# Patient Record
Sex: Male | Born: 1935 | Race: White | Hispanic: No | Marital: Married | Smoking: Never smoker
Health system: Southern US, Community
[De-identification: ages and names within clinical notes are randomized; demographics above are authoritative.]

## PROBLEM LIST (undated history)

## (undated) DIAGNOSIS — N390 Urinary tract infection, site not specified: Secondary | ICD-10-CM

## (undated) DIAGNOSIS — L0390 Cellulitis, unspecified: Secondary | ICD-10-CM

## (undated) DIAGNOSIS — J189 Pneumonia, unspecified organism: Secondary | ICD-10-CM

## (undated) DIAGNOSIS — M779 Enthesopathy, unspecified: Secondary | ICD-10-CM

## (undated) DIAGNOSIS — R339 Retention of urine, unspecified: Secondary | ICD-10-CM

## (undated) DIAGNOSIS — M719 Bursopathy, unspecified: Secondary | ICD-10-CM

## (undated) DIAGNOSIS — B029 Zoster without complications: Secondary | ICD-10-CM

## (undated) HISTORY — DX: Enthesopathy, unspecified: M77.9

## (undated) HISTORY — DX: Bursopathy, unspecified: M71.9

## (undated) HISTORY — DX: Pneumonia, unspecified organism: J18.9

## (undated) HISTORY — DX: Zoster without complications: B02.9

## (undated) HISTORY — DX: Retention of urine, unspecified: R33.9

## (undated) HISTORY — DX: Cellulitis, unspecified: L03.90

## (undated) HISTORY — DX: Urinary tract infection, site not specified: N39.0

## (undated) HISTORY — PX: JOINT REPLACEMENT: SHX530

---
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
## 1957-07-28 DIAGNOSIS — B029 Zoster without complications: Secondary | ICD-10-CM

## 1957-07-28 HISTORY — DX: Zoster without complications: B02.9

## 1964-07-28 HISTORY — PX: CHOLECYSTECTOMY: SHX55

## 1984-07-28 DIAGNOSIS — L0390 Cellulitis, unspecified: Secondary | ICD-10-CM

## 1984-07-28 HISTORY — DX: Cellulitis, unspecified: L03.90

## 1985-07-28 DIAGNOSIS — M719 Bursopathy, unspecified: Secondary | ICD-10-CM

## 1985-07-28 HISTORY — DX: Bursopathy, unspecified: M71.9

## 1986-07-28 DIAGNOSIS — M779 Enthesopathy, unspecified: Secondary | ICD-10-CM

## 1986-07-28 HISTORY — DX: Enthesopathy, unspecified: M77.9

## 1997-07-28 HISTORY — PX: TOTAL HIP ARTHROPLASTY: SHX124

## 1998-07-03 ENCOUNTER — Encounter: Payer: Self-pay | Admitting: Specialist

## 1998-07-06 ENCOUNTER — Inpatient Hospital Stay (HOSPITAL_COMMUNITY): Admission: RE | Admit: 1998-07-06 | Discharge: 1998-07-11 | Payer: Self-pay | Admitting: Specialist

## 1998-07-06 ENCOUNTER — Encounter: Payer: Self-pay | Admitting: Specialist

## 1998-07-09 ENCOUNTER — Encounter: Payer: Self-pay | Admitting: Specialist

## 1998-07-11 ENCOUNTER — Inpatient Hospital Stay (HOSPITAL_COMMUNITY)
Admission: RE | Admit: 1998-07-11 | Discharge: 1998-07-19 | Payer: Self-pay | Admitting: Physical Medicine and Rehabilitation

## 1998-07-16 ENCOUNTER — Encounter: Payer: Self-pay | Admitting: Physical Medicine and Rehabilitation

## 2008-10-26 HISTORY — PX: MELANOMA EXCISION: SHX5266

## 2011-11-28 ENCOUNTER — Ambulatory Visit: Payer: Self-pay | Admitting: Internal Medicine

## 2011-12-12 ENCOUNTER — Encounter: Payer: Self-pay | Admitting: Internal Medicine

## 2011-12-12 ENCOUNTER — Ambulatory Visit (INDEPENDENT_AMBULATORY_CARE_PROVIDER_SITE_OTHER): Payer: Medicare Other | Admitting: Internal Medicine

## 2011-12-12 VITALS — BP 171/99 | HR 87 | Temp 98.3°F | Resp 18 | Ht 73.5 in | Wt 295.5 lb

## 2011-12-12 DIAGNOSIS — E669 Obesity, unspecified: Secondary | ICD-10-CM | POA: Insufficient documentation

## 2011-12-12 DIAGNOSIS — D649 Anemia, unspecified: Secondary | ICD-10-CM

## 2011-12-12 DIAGNOSIS — I10 Essential (primary) hypertension: Secondary | ICD-10-CM | POA: Insufficient documentation

## 2011-12-12 DIAGNOSIS — E785 Hyperlipidemia, unspecified: Secondary | ICD-10-CM

## 2011-12-12 DIAGNOSIS — R609 Edema, unspecified: Secondary | ICD-10-CM

## 2011-12-12 DIAGNOSIS — R230 Cyanosis: Secondary | ICD-10-CM | POA: Insufficient documentation

## 2011-12-12 LAB — LIPID PANEL
Cholesterol: 171 mg/dL (ref 0–200)
HDL: 39 mg/dL — ABNORMAL LOW (ref >39)
LDL Cholesterol: 80 mg/dL (ref 0–99)
Total CHOL/HDL Ratio: 4.4 Ratio
Triglycerides: 262 mg/dL — ABNORMAL HIGH (ref <150)
VLDL: 52 mg/dL — ABNORMAL HIGH (ref 0–40)

## 2011-12-12 LAB — COMPREHENSIVE METABOLIC PANEL
ALT: 37 U/L (ref 0–53)
AST: 40 U/L — ABNORMAL HIGH (ref 0–37)
Albumin: 4.6 g/dL (ref 3.5–5.2)
Alkaline Phosphatase: 45 U/L (ref 39–117)
BUN: 14 mg/dL (ref 6–23)
CO2: 25 mEq/L (ref 19–32)
Calcium: 9.3 mg/dL (ref 8.4–10.5)
Chloride: 104 mEq/L (ref 96–112)
Creat: 1.36 mg/dL — ABNORMAL HIGH (ref 0.50–1.35)
Glucose, Bld: 116 mg/dL — ABNORMAL HIGH (ref 70–99)
Potassium: 4.5 mEq/L (ref 3.5–5.3)
Sodium: 141 mEq/L (ref 135–145)
Total Bilirubin: 1.3 mg/dL — ABNORMAL HIGH (ref 0.3–1.2)
Total Protein: 7 g/dL (ref 6.0–8.3)

## 2011-12-12 LAB — HM COLONOSCOPY

## 2011-12-12 NOTE — Patient Instructions (Signed)
Goal of walking 15-30 minutes per day.  Check blood pressure 1-2 times per week.  Follow up in 1 month.

## 2011-12-12 NOTE — Progress Notes (Signed)
Subjective:    Patient ID: Terry Vaughn, male    DOB: 1932/11/24, 78 y.o.   MRN: [id number]  HPI 78 year old male presents to establish care. He reports that generally, he has been healthy. He has no major medical problems. He is concerned today about recent swelling in his right lower leg. This has been going on for couple of months. 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 This is improved with the use of a compression sock. He denies any pain in his calf or foot. The swelling is worse later in the day.  He is also concerned about his weight. He reports that he has not been following a healthy diet or exercising. He is ready to get back on track with healthy diet and exercise plan.  Outpatient Encounter Prescriptions as of 12/12/2011  Medication Sig Dispense Refill  . aspirin 81 MG tablet Take 81 mg by mouth daily.      . Ferrous Gluconate (IRON) 240 (27 FE) MG TABS Take 1 each by mouth every other day.      . Multiple Vitamins-Minerals (CENTRUM SILVER ULTRA MENS) TABS Take 1 each by mouth daily.      . vitamin D, CHOLECALCIFEROL, 400 UNITS tablet Take 400 Units by mouth daily.        Review of Systems  Constitutional: Negative for fever, chills, activity change, appetite change, fatigue and unexpected weight change.  Eyes: Negative for visual disturbance.  Respiratory: Negative for cough and shortness of breath.   Cardiovascular: Positive for leg swelling. Negative for chest pain and palpitations.  Gastrointestinal: Negative for abdominal pain and abdominal distention.  Genitourinary: Negative for dysuria, urgency and difficulty urinating.  Musculoskeletal: Negative for arthralgias and gait problem.  Skin: Negative for color change and rash.  Hematological: Negative for adenopathy.  Psychiatric/Behavioral: Negative for sleep disturbance and dysphoric mood. The patient is not  nervous/anxious.    BP 171/99  Pulse 87  Temp(Src) 98.3 F (36.8 C) (Oral)  Resp 18  Ht 6' 1.5" (1.867 m)  Wt 295 lb 8 oz (134.038 kg)  BMI 38.46 kg/m2  SpO2 97%     Objective:   Physical Exam  Constitutional: He is oriented to person, place, and time. He appears well-developed and well-nourished. No distress.  HENT:  Head: Normocephalic and atraumatic.  Right Ear: External ear normal.  Left Ear: External ear normal.  Nose: Nose normal.  Mouth/Throat: Oropharynx is clear and moist. No oropharyngeal exudate.  Eyes: Conjunctivae and EOM are normal. Pupils are equal, round, and reactive to light. Right eye exhibits no discharge. Left eye exhibits no discharge. No scleral icterus.  Neck: Normal range of motion. Neck supple. No tracheal deviation present. No thyromegaly present.  Cardiovascular: Normal rate, regular rhythm and normal heart sounds.  Exam reveals no gallop and no friction rub.   No murmur heard. Pulmonary/Chest: Effort normal and breath sounds normal. No respiratory distress. He has no wheezes. He has no rales. He exhibits no tenderness.  Musculoskeletal: Normal range of motion. He exhibits edema (RLE to mid calf, with palpable varicose veins). He exhibits no tenderness.  Lymphadenopathy:    He has no cervical adenopathy.  Neurological: He is alert and oriented to person, place, and time. No cranial nerve deficit. Coordination normal.  Skin: Skin is warm and dry. No rash noted. He is not diaphoretic. There is cyanosis. No erythema. No pallor.  Psychiatric: He has a normal mood and affect. His behavior is normal. Judgment and thought content normal.          Assessment & Plan:

## 2011-12-12 NOTE — Assessment & Plan Note (Signed)
Blood pressure is elevated today. Patient's wife will monitor blood pressure at home once or twice per week. She will e-mail with results. Patient will have renal function check today with labs. If blood pressure persistently elevated, will add medication. Followup one month.

## 2011-12-12 NOTE — Assessment & Plan Note (Signed)
BMI 38. Encouraged efforts at healthy diet. Set goal of walking 15-30 minutes daily. Patient will followup in one month.

## 2011-12-12 NOTE — Assessment & Plan Note (Signed)
Noted in the right foot when foot is dependent. Question if he may have arterial insufficiency. Will get vascular surgery evaluation with arterial Doppler. Followup one month.

## 2011-12-12 NOTE — Assessment & Plan Note (Signed)
Likely secondary to venous insufficiency. Will get vascular evaluation with venous Dopplers. We'll continue compression stockings. Followup one month.

## 2011-12-13 LAB — CBC WITH DIFFERENTIAL/PLATELET
Basophils Absolute: 0 10*3/uL (ref 0.0–0.1)
Basophils Relative: 0 % (ref 0–1)
Eosinophils Absolute: 0.2 10*3/uL (ref 0.0–0.7)
Eosinophils Relative: 2 % (ref 0–5)
HCT: 44.5 % (ref 39.0–52.0)
Hemoglobin: 16 g/dL (ref 13.0–17.0)
Lymphocytes Relative: 28 % (ref 12–46)
Lymphs Abs: 2.3 10*3/uL (ref 0.7–4.0)
MCH: 33.4 pg (ref 26.0–34.0)
MCHC: 36 g/dL (ref 30.0–36.0)
MCV: 92.9 fL (ref 78.0–100.0)
Monocytes Absolute: 0.5 10*3/uL (ref 0.1–1.0)
Monocytes Relative: 6 % (ref 3–12)
Neutro Abs: 5.2 10*3/uL (ref 1.7–7.7)
Neutrophils Relative %: 64 % (ref 43–77)
Platelets: 337 10*3/uL (ref 150–400)
RBC: 4.79 MIL/uL (ref 4.22–5.81)
RDW: 13.4 % (ref 11.5–15.5)
WBC: 8.2 10*3/uL (ref 4.0–10.5)

## 2011-12-17 ENCOUNTER — Other Ambulatory Visit: Payer: Medicare Other | Admitting: *Deleted

## 2011-12-17 DIAGNOSIS — N289 Disorder of kidney and ureter, unspecified: Secondary | ICD-10-CM

## 2011-12-17 LAB — MICROALBUMIN / CREATININE URINE RATIO
Creatinine,U: 54.2 mg/dL
Microalb Creat Ratio: 1.3 mg/g (ref 0.0–30.0)
Microalb, Ur: 0.7 mg/dL (ref 0.0–1.9)

## 2012-01-05 ENCOUNTER — Other Ambulatory Visit (INDEPENDENT_AMBULATORY_CARE_PROVIDER_SITE_OTHER): Payer: Medicare Other | Admitting: *Deleted

## 2012-01-05 DIAGNOSIS — Z79899 Other long term (current) drug therapy: Secondary | ICD-10-CM

## 2012-01-05 LAB — COMPREHENSIVE METABOLIC PANEL
ALT: 39 U/L (ref 0–53)
AST: 32 U/L (ref 0–37)
Albumin: 4.3 g/dL (ref 3.5–5.2)
Alkaline Phosphatase: 45 U/L (ref 39–117)
BUN: 12 mg/dL (ref 6–23)
CO2: 27 mEq/L (ref 19–32)
Calcium: 8.8 mg/dL (ref 8.4–10.5)
Chloride: 104 mEq/L (ref 96–112)
Creatinine, Ser: 1.2 mg/dL (ref 0.4–1.5)
GFR: 61.19 mL/min (ref >60.00)
Glucose, Bld: 98 mg/dL (ref 70–99)
Potassium: 4 mEq/L (ref 3.5–5.1)
Sodium: 141 mEq/L (ref 135–145)
Total Bilirubin: 1.4 mg/dL — ABNORMAL HIGH (ref 0.3–1.2)
Total Protein: 6.9 g/dL (ref 6.0–8.3)

## 2012-01-14 ENCOUNTER — Other Ambulatory Visit: Payer: Self-pay | Admitting: Internal Medicine

## 2012-01-14 DIAGNOSIS — R17 Unspecified jaundice: Secondary | ICD-10-CM

## 2012-01-15 ENCOUNTER — Ambulatory Visit: Payer: Self-pay | Admitting: Internal Medicine

## 2012-01-15 ENCOUNTER — Telehealth: Payer: Self-pay | Admitting: Internal Medicine

## 2012-01-15 NOTE — Telephone Encounter (Signed)
Patients spouse advised as instructed via telephone. 

## 2012-01-15 NOTE — Telephone Encounter (Signed)
Liver ultrasound was normal

## 2012-01-21 ENCOUNTER — Encounter: Payer: Self-pay | Admitting: Internal Medicine

## 2012-01-23 ENCOUNTER — Encounter: Payer: Medicare Other | Admitting: Internal Medicine

## 2012-01-28 ENCOUNTER — Encounter: Payer: Self-pay | Admitting: Internal Medicine

## 2012-01-28 ENCOUNTER — Ambulatory Visit (INDEPENDENT_AMBULATORY_CARE_PROVIDER_SITE_OTHER): Payer: Medicare Other | Admitting: Internal Medicine

## 2012-01-28 VITALS — BP 150/84 | HR 73 | Temp 98.3°F | Ht 73.5 in | Wt 282.0 lb

## 2012-01-28 DIAGNOSIS — R17 Unspecified jaundice: Secondary | ICD-10-CM

## 2012-01-28 DIAGNOSIS — I10 Essential (primary) hypertension: Secondary | ICD-10-CM

## 2012-01-28 DIAGNOSIS — E669 Obesity, unspecified: Secondary | ICD-10-CM

## 2012-01-28 DIAGNOSIS — Z0000 Encounter for general adult medical examination without abnormal findings: Secondary | ICD-10-CM

## 2012-01-28 DIAGNOSIS — E781 Pure hyperglyceridemia: Secondary | ICD-10-CM

## 2012-01-28 DIAGNOSIS — R609 Edema, unspecified: Secondary | ICD-10-CM

## 2012-01-28 DIAGNOSIS — Z7689 Persons encountering health services in other specified circumstances: Secondary | ICD-10-CM | POA: Insufficient documentation

## 2012-01-28 NOTE — Assessment & Plan Note (Signed)
Seen by vascular. Wearing compression stockings.  Planned follow up with vascular to assess need for further intervention in 3 months.

## 2012-01-28 NOTE — Assessment & Plan Note (Signed)
Mild elevation noted on labs. Stable. [place] of RUQ was normal. Will repeat LFTs 3 months.

## 2012-01-28 NOTE — Assessment & Plan Note (Signed)
Congratulated pt on 13lb weight loss. Encouraged him to continue with effort at healthy diet and physical activity. Encouraged him to increase walking to daily.

## 2012-01-28 NOTE — Assessment & Plan Note (Signed)
BP initially elevated, then improved today. Much better controlled at home. Suspect element of white coat hypertension. Will continue to monitor. Follow up 3 months.

## 2012-01-28 NOTE — Assessment & Plan Note (Signed)
Noted on labs. Encouraged continued efforts at healthy diet, low in saturated fat and processed carbs. Will repeat 3 months.

## 2012-01-28 NOTE — Progress Notes (Signed)
Subjective:    Patient ID: Terry Vaughn, male    DOB: [birth year], 78 y.o.   MRN: [id number]  HPI The patient is here for annual Medicare wellness examination and management of other chronic and acute problems.   The risk factors are reflected in the social history.  The roster of all physicians providing medical care to patient - is listed in the Snapshot section of the chart.  Activities of daily living:  The patient is 100% independent in all ADLs: dressing, toileting, feeding as well as independent mobility  Home safety : The patient has smoke detectors and CO detectors in the home. They wear seatbelts.  There are no firearms at home. There is no violence in the home.   There is no risks for hepatitis, STDs or HIV. There is history of blood transfusion from father. They have no travel history to infectious disease endemic areas of the world.  The patient have not seen their dentist in the last six (Dr. Betsey Holiday was previous dentist). They have seen their eye doctor in the last year (optometrist).  No hearing difficulty with regard to whispered voices and some television programs.  They have deferred audiologic testing in the last year.    They do not  have excessive sun exposure. Discussed the need for sun protection: hats, long sleeves and use of sunscreen if there is significant sun exposure. (Dr. Roseanne Kaufman is dermatologist)  Diet: the importance of a healthy diet is discussed. He does have a healthy diet. He has lost 13lbs over the last 2 months by improving his diet and increasing intake of fruits and vegetables.  The benefits of regular aerobic exercise were discussed. He walks daily .  Depression screen: there are no signs or vegative symptoms of depression- irritability, change in appetite, anhedonia, sadness/tearfullness.  Cognitive assessment: the patient manages all their financial and personal affairs and is actively engaged. They could relate day,date,year and  events.  The following portions of the patient's history were reviewed and updated as appropriate: allergies, current medications, past family history, past medical history,  past surgical history, past social history  and problem list.  Visual acuity was not assessed per patient preference since he has regular follow up with ophthalmologist. Hearing and body mass index were assessed and reviewed.   During the course of the visit the patient was educated and counseled about appropriate screening and preventive services including : fall prevention , diabetes screening, nutrition counseling, colorectal cancer screening, and recommended immunizations.    In regards to HTN, pt has been monitoring BP daily at home. He brings record of BP, which has mostly been 130-140/60-80s.  HR in 50-70s.  Today, he brings copy of Living Will, which is notarized. He prefers not to have life-prolonging interventions if chance of cure minimal.   Outpatient Encounter Prescriptions as of 01/28/2012  Medication Sig Dispense Refill  . aspirin 81 MG tablet Take 81 mg by mouth daily.      . Ferrous Gluconate (IRON) 240 (27 FE) MG TABS Take 1 each by mouth every other day.      . Multiple Vitamins-Minerals (CENTRUM SILVER ULTRA MENS) TABS Take 1 each by mouth daily.      . vitamin D, CHOLECALCIFEROL, 400 UNITS tablet Take 400 Units by mouth daily.       BP 150/84  Pulse 73  Temp 98.3 F (36.8 C) (Oral)  Ht 6' 1.5" (1.867 m)  Wt 282 lb (127.914 kg)  BMI 36.70 kg/m2  SpO2 97%  Review of Systems  Constitutional: Negative for fever, chills, activity change, appetite change, fatigue and unexpected weight change.  Eyes: Negative for visual disturbance.  Respiratory: Negative for cough and shortness of breath.   Cardiovascular: Negative for chest pain, palpitations and leg swelling.  Gastrointestinal: Negative for abdominal pain and abdominal distention.  Genitourinary: Negative for dysuria, urgency and difficulty  urinating.  Musculoskeletal: Negative for arthralgias and gait problem.  Skin: Negative for color change and rash.  Hematological: Negative for adenopathy.  Psychiatric/Behavioral: Negative for disturbed wake/sleep cycle and dysphoric mood. The patient is not nervous/anxious.        Objective:   Physical Exam  Constitutional: He is oriented to person, place, and time. He appears well-developed and well-nourished. No distress.  HENT:  Head: Normocephalic and atraumatic.  Right Ear: External ear normal.  Left Ear: External ear normal.  Nose: Nose normal.  Mouth/Throat: Oropharynx is clear and moist. No oropharyngeal exudate.  Eyes: Conjunctivae and EOM are normal. Pupils are equal, round, and reactive to light. Right eye exhibits no discharge. Left eye exhibits no discharge. No scleral icterus.  Neck: Normal range of motion. Neck supple. No tracheal deviation present. No thyromegaly present.  Cardiovascular: Normal rate, regular rhythm and normal heart sounds.  Exam reveals no gallop and no friction rub.   No murmur heard. Pulmonary/Chest: Effort normal and breath sounds normal. No respiratory distress. He has no wheezes. He has no rales. He exhibits no tenderness.  Abdominal: Soft. Bowel sounds are normal. He exhibits no distension and no mass. There is no tenderness. There is no guarding.  Musculoskeletal: Normal range of motion. He exhibits no edema.  Lymphadenopathy:    He has no cervical adenopathy.  Neurological: He is alert and oriented to person, place, and time. No cranial nerve deficit. Coordination normal.  Skin: Skin is warm and dry. No rash noted. He is not diaphoretic. No erythema. No pallor.  Psychiatric: He has a normal mood and affect. His behavior is normal. Judgment and thought content normal.          Assessment & Plan:

## 2012-01-28 NOTE — Assessment & Plan Note (Signed)
General exam normal today. Chronic health issues addressed as described. Appropriate screening performed. Health maintenance UTD. Follow up 3 months.

## 2012-03-18 LAB — HM DIABETES EYE EXAM

## 2012-05-28 ENCOUNTER — Other Ambulatory Visit (INDEPENDENT_AMBULATORY_CARE_PROVIDER_SITE_OTHER): Payer: 59

## 2012-05-28 DIAGNOSIS — R17 Unspecified jaundice: Secondary | ICD-10-CM

## 2012-05-28 DIAGNOSIS — E781 Pure hyperglyceridemia: Secondary | ICD-10-CM

## 2012-05-28 DIAGNOSIS — E669 Obesity, unspecified: Secondary | ICD-10-CM

## 2012-05-28 LAB — COMPREHENSIVE METABOLIC PANEL
ALT: 49 U/L (ref 0–53)
AST: 44 U/L — ABNORMAL HIGH (ref 0–37)
Albumin: 4.2 g/dL (ref 3.5–5.2)
Alkaline Phosphatase: 42 U/L (ref 39–117)
BUN: 14 mg/dL (ref 6–23)
CO2: 32 mEq/L (ref 19–32)
Calcium: 9 mg/dL (ref 8.4–10.5)
Chloride: 102 mEq/L (ref 96–112)
Creatinine, Ser: 1.2 mg/dL (ref 0.4–1.5)
GFR: 65.44 mL/min (ref >60.00)
Glucose, Bld: 91 mg/dL (ref 70–99)
Potassium: 4.2 mEq/L (ref 3.5–5.1)
Sodium: 140 mEq/L (ref 135–145)
Total Bilirubin: 2 mg/dL — ABNORMAL HIGH (ref 0.3–1.2)
Total Protein: 6.8 g/dL (ref 6.0–8.3)

## 2012-05-28 LAB — LIPID PANEL
Cholesterol: 139 mg/dL (ref 0–200)
HDL: 41 mg/dL (ref >39.00)
LDL Cholesterol: 78 mg/dL (ref 0–99)
Total CHOL/HDL Ratio: 3
Triglycerides: 99 mg/dL (ref 0.0–149.0)
VLDL: 19.8 mg/dL (ref 0.0–40.0)

## 2012-06-02 ENCOUNTER — Ambulatory Visit (INDEPENDENT_AMBULATORY_CARE_PROVIDER_SITE_OTHER): Payer: 59 | Admitting: Internal Medicine

## 2012-06-02 ENCOUNTER — Encounter: Payer: Self-pay | Admitting: Internal Medicine

## 2012-06-02 VITALS — BP 140/90 | HR 61 | Temp 98.1°F | Ht 73.5 in | Wt 262.8 lb

## 2012-06-02 DIAGNOSIS — R17 Unspecified jaundice: Secondary | ICD-10-CM

## 2012-06-02 DIAGNOSIS — I10 Essential (primary) hypertension: Secondary | ICD-10-CM

## 2012-06-02 DIAGNOSIS — E669 Obesity, unspecified: Secondary | ICD-10-CM

## 2012-06-02 NOTE — Assessment & Plan Note (Signed)
Recent BP well controlled. Will continue to monitor.

## 2012-06-02 NOTE — Assessment & Plan Note (Signed)
Bilirubin increased on recent labs. Recommended referral to GI for upper endoscopy with EUS for further evaluation, however pt declined. Will instead repeat abdominal US to see if any change in CBD, which was at upper limits of normal size on exam in 12/2011.  Repeat CMP in 3 months.

## 2012-06-02 NOTE — Progress Notes (Signed)
  Subjective:    Patient ID: Terry Vaughn, male    DOB: [birth year], 78 y.o.   MRN: [id number]  HPI 78 year old male with history of hypertension, obesity presents for followup. In regards to hypertension, he brings record of blood pressures which has been typically 1:30 to 140 over 80s. He denies any chest pain, headache, palpitations. He has been following a healthy diet and has been exercising daily by walking at least 30 minutes. He reports that he feels great and energy level is much improved compared to previous. He has lost over 30 pounds since starting healthy diet and exercise program. He denies any nausea, vomiting, abdominal pain, change in bowel habits.  Outpatient Encounter Prescriptions as of 06/02/2012  Medication Sig Dispense Refill  . aspirin 81 MG tablet Take 81 mg by mouth daily.      . Ferrous Gluconate (IRON) 240 (27 FE) MG TABS Take 1 each by mouth every other day.      . Multiple Vitamins-Minerals (CENTRUM SILVER ULTRA MENS) TABS Take 1 each by mouth daily.      . vitamin D, CHOLECALCIFEROL, 400 UNITS tablet Take 400 Units by mouth daily.        Review of Systems  Constitutional: Negative for fever, chills, activity change, appetite change, fatigue and unexpected weight change.  Eyes: Negative for visual disturbance.  Respiratory: Negative for cough and shortness of breath.   Cardiovascular: Negative for chest pain, palpitations and leg swelling.  Gastrointestinal: Negative for abdominal pain and abdominal distention.  Genitourinary: Negative for dysuria, urgency and difficulty urinating.  Musculoskeletal: Negative for arthralgias and gait problem.  Skin: Negative for color change and rash.  Hematological: Negative for adenopathy.  Psychiatric/Behavioral: Negative for sleep disturbance and dysphoric mood. The patient is not nervous/anxious.        Objective:   Physical Exam  Constitutional: He is oriented to person, place, and time. He appears well-developed and  well-nourished. No distress.  HENT:  Head: Normocephalic and atraumatic.  Right Ear: External ear normal.  Left Ear: External ear normal.  Nose: Nose normal.  Mouth/Throat: Oropharynx is clear and moist. No oropharyngeal exudate.  Eyes: Conjunctivae normal and EOM are normal. Pupils are equal, round, and reactive to light. Right eye exhibits no discharge. Left eye exhibits no discharge. No scleral icterus.  Neck: Normal range of motion. Neck supple. No tracheal deviation present. No thyromegaly present.  Cardiovascular: Normal rate, regular rhythm and normal heart sounds.  Exam reveals no gallop and no friction rub.   No murmur heard. Pulmonary/Chest: Effort normal and breath sounds normal. No respiratory distress. He has no wheezes. He has no rales. He exhibits no tenderness.  Abdominal: Soft. Bowel sounds are normal. He exhibits no distension. There is no tenderness.  Musculoskeletal: Normal range of motion. He exhibits no edema.  Lymphadenopathy:    He has no cervical adenopathy.  Neurological: He is alert and oriented to person, place, and time. No cranial nerve deficit. Coordination normal.  Skin: Skin is warm and dry. No rash noted. He is not diaphoretic. No erythema. No pallor.  Psychiatric: He has a normal mood and affect. His behavior is normal. Judgment and thought content normal.          Assessment & Plan:

## 2012-06-02 NOTE — Assessment & Plan Note (Signed)
Congratulated pt on 30lb weight loss. Encouraged him to continue efforts at healthy diet and regular physical activity.

## 2012-06-08 ENCOUNTER — Ambulatory Visit: Payer: Self-pay | Admitting: Internal Medicine

## 2012-06-10 ENCOUNTER — Telehealth: Payer: Self-pay | Admitting: Internal Medicine

## 2012-06-10 NOTE — Telephone Encounter (Signed)
Patient spouse advised as instructed, she will talk with Homero Fellers and call us back tomorrow.

## 2012-06-10 NOTE — Telephone Encounter (Signed)
Results given to Dr. Dan Humphreys.

## 2012-06-10 NOTE — Telephone Encounter (Signed)
Pt's wife is calling concerning the results on the abdominal ultrasound. They were wanting to know if the results have come in and if everything was ok.

## 2012-06-10 NOTE — Telephone Encounter (Signed)
Requested [place] results from [facility].

## 2012-06-10 NOTE — Telephone Encounter (Signed)
Abdominal US was normal, however common bile duct still enlarged. I would like to set pt up with GI physician given persistent elevation of LFTs.  They may want to do a special form of upper endoscopy to look at the bile duct.

## 2012-06-10 NOTE — Telephone Encounter (Signed)
I have not yet received results on abdominal US. Can you please request this and let pt know?

## 2012-06-14 ENCOUNTER — Telehealth: Payer: Self-pay | Admitting: Internal Medicine

## 2012-06-14 NOTE — Telephone Encounter (Signed)
Pt called would like to go to dr Leander Rams [facility]  Cannot go 11/20  And 11/21

## 2012-06-15 NOTE — Telephone Encounter (Signed)
Pt calling about his endoscopy appointment

## 2012-06-17 NOTE — Telephone Encounter (Signed)
Terry Vaughn, Can you look into this? He was to be scheduled with GI physician.

## 2012-06-21 NOTE — Telephone Encounter (Signed)
Patient scheduled with Dr. Skeet Simmer on 12.2.13 @ 2:15 at [facility] . His wife is aware of appointment.

## 2012-06-22 ENCOUNTER — Encounter: Payer: Self-pay | Admitting: Internal Medicine

## 2012-07-02 ENCOUNTER — Ambulatory Visit: Payer: Self-pay | Admitting: Gastroenterology

## 2012-12-01 ENCOUNTER — Ambulatory Visit (INDEPENDENT_AMBULATORY_CARE_PROVIDER_SITE_OTHER): Payer: Medicare Other | Admitting: Internal Medicine

## 2012-12-01 ENCOUNTER — Encounter: Payer: Self-pay | Admitting: Internal Medicine

## 2012-12-01 VITALS — BP 160/98 | HR 72 | Temp 98.1°F | Wt 259.0 lb

## 2012-12-01 DIAGNOSIS — R17 Unspecified jaundice: Secondary | ICD-10-CM

## 2012-12-01 DIAGNOSIS — I10 Essential (primary) hypertension: Secondary | ICD-10-CM

## 2012-12-01 DIAGNOSIS — E669 Obesity, unspecified: Secondary | ICD-10-CM

## 2012-12-01 LAB — COMPREHENSIVE METABOLIC PANEL
ALT: 40 U/L (ref 0–53)
AST: 31 U/L (ref 0–37)
Albumin: 4.6 g/dL (ref 3.5–5.2)
Alkaline Phosphatase: 49 U/L (ref 39–117)
BUN: 13 mg/dL (ref 6–23)
CO2: 32 mEq/L (ref 19–32)
Calcium: 9.3 mg/dL (ref 8.4–10.5)
Chloride: 100 mEq/L (ref 96–112)
Creatinine, Ser: 1.2 mg/dL (ref 0.4–1.5)
GFR: 61.63 mL/min (ref >60.00)
Glucose, Bld: 98 mg/dL (ref 70–99)
Potassium: 4.4 mEq/L (ref 3.5–5.1)
Sodium: 139 mEq/L (ref 135–145)
Total Bilirubin: 2.1 mg/dL — ABNORMAL HIGH (ref 0.3–1.2)
Total Protein: 7.3 g/dL (ref 6.0–8.3)

## 2012-12-01 LAB — MICROALBUMIN / CREATININE URINE RATIO
Creatinine,U: 89.6 mg/dL
Microalb Creat Ratio: 0.6 mg/g (ref 0.0–30.0)
Microalb, Ur: 0.5 mg/dL (ref 0.0–1.9)

## 2012-12-01 NOTE — Progress Notes (Signed)
Subjective:    Patient ID: Terry Vaughn, male    DOB: [birth year], 79 y.o.   MRN: [id number]  HPI 79 year old male presents for followup. He is very upset today reporting that this visit should have been scheduled at the physical exam, however we had a long discussion about how his last physical exam was 01/28/2012 in he is not yet due for repeat physical. He is also extremely frustrated and upset about ongoing billing issues. He reports that being anxious and upset about this has increased his blood pressure. At home, his blood pressure has generally been well-controlled, he reports typically 130-140/80-90. He denies any chest pain, palpitations, headache.  At his last visit, he was noted to have persistent elevation of bilirubin level and slightly enlarged common bile duct on ultrasound. He was referred to GI physician and underwent CT scan of the abdomen which she reports was normal. He was told that elevated bilirubin was "normal "for him. He denies any symptoms such as abdominal pain, nausea, vomiting.  In regards to obesity, he reports some dietary indiscretion and reduced frequency of exercise over the winter months.  Outpatient Encounter Prescriptions as of 12/01/2012  Medication Sig Dispense Refill  . aspirin 81 MG tablet Take 81 mg by mouth daily.      . Ferrous Gluconate (IRON) 240 (27 FE) MG TABS Take 1 each by mouth every other day.      . Multiple Vitamins-Minerals (CENTRUM SILVER ULTRA MENS) TABS Take 1 each by mouth daily.      . vitamin D, CHOLECALCIFEROL, 400 UNITS tablet Take 400 Units by mouth daily.       No facility-administered encounter medications on file as of 12/01/2012.   BP 160/98  Pulse 72  Temp(Src) 98.1 F (36.7 C) (Oral)  Wt 259 lb (117.482 kg)  BMI 33.7 kg/m2  SpO2 97%  Review of Systems  Constitutional: Negative for fever, chills, activity change, appetite change, fatigue and unexpected weight change.  Eyes: Negative for visual disturbance.  Respiratory:  Negative for cough and shortness of breath.   Cardiovascular: Negative for chest pain, palpitations and leg swelling.  Gastrointestinal: Negative for abdominal pain and abdominal distention.  Genitourinary: Negative for dysuria, urgency and difficulty urinating.  Musculoskeletal: Negative for arthralgias and gait problem.  Skin: Negative for color change and rash.  Hematological: Negative for adenopathy.  Psychiatric/Behavioral: Positive for agitation. Negative for sleep disturbance and dysphoric mood. The patient is nervous/anxious.        Objective:   Physical Exam  Constitutional: He is oriented to person, place, and time. He appears well-developed and well-nourished. No distress.  HENT:  Head: Normocephalic and atraumatic.  Right Ear: External ear normal.  Left Ear: External ear normal.  Nose: Nose normal.  Mouth/Throat: Oropharynx is clear and moist. No oropharyngeal exudate.  Eyes: Conjunctivae and EOM are normal. Pupils are equal, round, and reactive to light. Right eye exhibits no discharge. Left eye exhibits no discharge. No scleral icterus.  Neck: Normal range of motion. Neck supple. No tracheal deviation present. No thyromegaly present.  Cardiovascular: Normal rate, regular rhythm and normal heart sounds.  Exam reveals no gallop and no friction rub.   No murmur heard. Pulmonary/Chest: Effort normal and breath sounds normal. No accessory muscle usage. Not tachypneic. No respiratory distress. He has no decreased breath sounds. He has no wheezes. He has no rhonchi. He has no rales. He exhibits no tenderness.  Musculoskeletal: Normal range of motion. He exhibits no edema.  Lymphadenopathy:  He has no cervical adenopathy.  Neurological: He is alert and oriented to person, place, and time. No cranial nerve deficit. Coordination normal.  Skin: Skin is warm and dry. No rash noted. He is not diaphoretic. No erythema. No pallor.  Psychiatric: His speech is normal. Judgment and  thought content normal. His mood appears anxious. His affect is angry. He is agitated.          Assessment & Plan:

## 2012-12-01 NOTE — Assessment & Plan Note (Signed)
BP Readings from Last 3 Encounters:  12/01/12 160/98  06/02/12 140/90  01/28/12 150/84   BP elevated today, however pt very upset during his visit about ongoing care and billing issues. Reports BP has been wellcontrolled at home. Will continue to monitor. Renal function normal today. Followup 3 months and prn.

## 2012-12-01 NOTE — Assessment & Plan Note (Signed)
Bilirubin continues to be elevated. Pt was evaluated by Dr. Skeet Simmer with GI and reports he was told this is "normal" for him. Had CT scan which was normal. Will request records on this evaluation. Followup 3-6 months and prn.

## 2012-12-01 NOTE — Assessment & Plan Note (Signed)
Wt Readings from Last 3 Encounters:  12/01/12 259 lb (117.482 kg)  06/02/12 262 lb 12 oz (119.183 kg)  01/28/12 282 lb (127.914 kg)   Weight down another 3 lbs. Encouraged compliance with diet and exercise.

## 2012-12-24 ENCOUNTER — Encounter: Payer: Self-pay | Admitting: Gastroenterology

## 2013-02-14 ENCOUNTER — Ambulatory Visit (INDEPENDENT_AMBULATORY_CARE_PROVIDER_SITE_OTHER): Payer: Medicare Other | Admitting: Adult Health

## 2013-02-14 ENCOUNTER — Encounter: Payer: Self-pay | Admitting: Adult Health

## 2013-02-14 VITALS — BP 110/66 | HR 66 | Temp 97.5°F | Resp 12 | Wt 262.0 lb

## 2013-02-14 DIAGNOSIS — B029 Zoster without complications: Secondary | ICD-10-CM | POA: Insufficient documentation

## 2013-02-14 MED ORDER — VALACYCLOVIR HCL 1 G PO TABS: 1000.0000 mg | ORAL_TABLET | Freq: Three times a day (TID) | ORAL | Status: DC

## 2013-02-14 NOTE — Patient Instructions (Addendum)
  You do have shingles.  Start Valacyclovir 1000 mg every 8 hours for 7 days.  Apply calamine lotion to the area.  Please call if your symptoms worsen or if you begin to experience pain not relieved by the Aleve.

## 2013-02-14 NOTE — Assessment & Plan Note (Signed)
Start valacyclovir. Apply calamine lotion to affected area. Pt has been getting good relief with Aleve. RTC if symptoms worsen or if pain not relieved by Aleve.

## 2013-02-14 NOTE — Progress Notes (Signed)
  Subjective:    Patient ID: Terry Vaughn, male    DOB: 1933-05-08, 79 y.o.   MRN: [id number]  HPI  Patient presents to clinic with development of erythema and a maculopapular rash on the left side of abdomen and around towards the back. First noticed the rash on Friday. It is painful without any c/o headache or malaise.   Current Outpatient Prescriptions on File Prior to Visit  Medication Sig Dispense Refill  . aspirin 81 MG tablet Take 81 mg by mouth daily.      . Ferrous Gluconate (IRON) 240 (27 FE) MG TABS Take 1 each by mouth every other day.      . Multiple Vitamins-Minerals (CENTRUM SILVER ULTRA MENS) TABS Take 1 each by mouth daily.      . vitamin D, CHOLECALCIFEROL, 400 UNITS tablet Take 400 Units by mouth daily.       No current facility-administered medications on file prior to visit.    Review of Systems  Constitutional: Negative.   Respiratory: Negative.   Cardiovascular: Negative.   Skin: Positive for rash.       Maculopapular rash on left abdomen around towards the back. Hx of shingles at age 28.     BP 110/66  Pulse 66  Temp(Src) 97.5 F (36.4 C) (Oral)  Resp 12  Wt 262 lb (118.842 kg)  BMI 34.09 kg/m2  SpO2 98%     Objective:   Physical Exam  Constitutional: He is oriented to person, place, and time. He appears well-developed and well-nourished. No distress.  Cardiovascular: Normal rate and regular rhythm.   Pulmonary/Chest: Effort normal. No respiratory distress.  Neurological: He is alert and oriented to person, place, and time.  Skin:  Maculopapular rash on left side of abdomen. There are several clusters of clear vesicles noted consistent with shingles.   Psychiatric: He has a normal mood and affect. His behavior is normal. Judgment and thought content normal.          Assessment & Plan:

## 2013-03-17 ENCOUNTER — Encounter: Payer: Medicare Other | Admitting: Internal Medicine

## 2013-03-22 ENCOUNTER — Encounter: Payer: Self-pay | Admitting: Internal Medicine

## 2013-03-22 ENCOUNTER — Ambulatory Visit (INDEPENDENT_AMBULATORY_CARE_PROVIDER_SITE_OTHER): Payer: Medicare Other | Admitting: Internal Medicine

## 2013-03-22 VITALS — BP 144/90 | HR 70 | Temp 97.9°F | Ht 73.25 in | Wt 261.0 lb

## 2013-03-22 DIAGNOSIS — Z0000 Encounter for general adult medical examination without abnormal findings: Secondary | ICD-10-CM

## 2013-03-22 DIAGNOSIS — D509 Iron deficiency anemia, unspecified: Secondary | ICD-10-CM | POA: Insufficient documentation

## 2013-03-22 DIAGNOSIS — I10 Essential (primary) hypertension: Secondary | ICD-10-CM

## 2013-03-22 DIAGNOSIS — E669 Obesity, unspecified: Secondary | ICD-10-CM

## 2013-03-22 LAB — CBC WITH DIFFERENTIAL/PLATELET
Basophils Absolute: 0.1 10*3/uL (ref 0.0–0.1)
Basophils Relative: 0.7 % (ref 0.0–3.0)
Eosinophils Absolute: 0.1 10*3/uL (ref 0.0–0.7)
Eosinophils Relative: 1.6 % (ref 0.0–5.0)
HCT: 43.6 % (ref 39.0–52.0)
Hemoglobin: 15 g/dL (ref 13.0–17.0)
Lymphocytes Relative: 29.1 % (ref 12.0–46.0)
Lymphs Abs: 2.4 10*3/uL (ref 0.7–4.0)
MCHC: 34.4 g/dL (ref 30.0–36.0)
MCV: 97.1 fl (ref 78.0–100.0)
Monocytes Absolute: 0.6 10*3/uL (ref 0.1–1.0)
Monocytes Relative: 7.7 % (ref 3.0–12.0)
Neutro Abs: 5.1 10*3/uL (ref 1.4–7.7)
Neutrophils Relative %: 60.9 % (ref 43.0–77.0)
Platelets: 331 10*3/uL (ref 150.0–400.0)
RBC: 4.49 Mil/uL (ref 4.22–5.81)
RDW: 14.6 % (ref 11.5–14.6)
WBC: 8.4 10*3/uL (ref 4.5–10.5)

## 2013-03-22 LAB — COMPREHENSIVE METABOLIC PANEL
ALT: 40 U/L (ref 0–53)
AST: 39 U/L — ABNORMAL HIGH (ref 0–37)
Albumin: 4.4 g/dL (ref 3.5–5.2)
Alkaline Phosphatase: 46 U/L (ref 39–117)
BUN: 17 mg/dL (ref 6–23)
CO2: 31 mEq/L (ref 19–32)
Calcium: 9.2 mg/dL (ref 8.4–10.5)
Chloride: 101 mEq/L (ref 96–112)
Creatinine, Ser: 1.3 mg/dL (ref 0.4–1.5)
GFR: 58.23 mL/min — ABNORMAL LOW (ref >60.00)
Glucose, Bld: 94 mg/dL (ref 70–99)
Potassium: 4.6 mEq/L (ref 3.5–5.1)
Sodium: 138 mEq/L (ref 135–145)
Total Bilirubin: 1.5 mg/dL — ABNORMAL HIGH (ref 0.3–1.2)
Total Protein: 7.3 g/dL (ref 6.0–8.3)

## 2013-03-22 LAB — FERRITIN: Ferritin: 386.9 ng/mL — ABNORMAL HIGH (ref 22.0–322.0)

## 2013-03-22 LAB — LIPID PANEL
Cholesterol: 147 mg/dL (ref 0–200)
HDL: 44.4 mg/dL (ref >39.00)
LDL Cholesterol: 85 mg/dL (ref 0–99)
Total CHOL/HDL Ratio: 3
Triglycerides: 87 mg/dL (ref 0.0–149.0)
VLDL: 17.4 mg/dL (ref 0.0–40.0)

## 2013-03-22 NOTE — Progress Notes (Signed)
Subjective:    Patient ID: Terry Vaughn, male    DOB: 09/13/1932, 79 y.o.   MRN: [id number]  HPI The patient is here for annual Medicare wellness examination and management of other chronic and acute problems.   The risk factors are reflected in the social history.  The roster of all physicians providing medical care to patient - is listed in the Snapshot section of the chart.  Activities of daily living:  The patient is 100% independent in all ADLs: dressing, toileting, feeding as well as independent mobility  Home safety : The patient has smoke detectors and CO detectors in the home. They wear seatbelts.  There are no firearms at home. There is no violence in the home.   There is no risks for hepatitis, STDs or HIV. There is history of blood transfusion from father, 59. They have no travel history to infectious disease endemic areas of the world.  The patient have scheduled to see dentist, Dr. Shirley Muscat, this year. They have seen their eye doctor in the last year (optometrist).  Scheduled for cataract removal bilaterally with Dr. Haskel Khan, [facility]. No hearing difficulty with regard to whispered voices and some television programs.  They have deferred audiologic testing in the last year.    They do not  have excessive sun exposure. Discussed the need for sun protection: hats, long sleeves and use of sunscreen if there is significant sun exposure. (Dr. Roseanne Kaufman is dermatologist)  Diet: the importance of a healthy diet is discussed. He does have a healthy diet.   The benefits of regular aerobic exercise were discussed. He walks daily if possible or does yard work.  Depression screen: there are no signs or vegative symptoms of depression- irritability, change in appetite, anhedonia, sadness/tearfullness.  Cognitive assessment: the patient manages all their financial and personal affairs and is actively engaged. They could relate day,date,year and events.  The following  portions of the patient's history were reviewed and updated as appropriate: allergies, current medications, past family history, past medical history,  past surgical history, past social history  and problem list.  Visual acuity was not assessed per patient preference since he has regular follow up with ophthalmologist. Hearing and body mass index were assessed and reviewed.   During the course of the visit the patient was educated and counseled about appropriate screening and preventive services including : fall prevention , diabetes screening, nutrition counseling, colorectal cancer screening, and recommended immunizations.    In regards to HTN, pt has been monitoring BP at home. He did not bring records today. Reports BP well controlled, <140/90.  Outpatient Encounter Prescriptions as of 03/22/2013  Medication Sig Dispense Refill  . aspirin 81 MG tablet Take 81 mg by mouth daily.      . Ferrous Gluconate (IRON) 240 (27 FE) MG TABS Take 1 each by mouth every other day.      . Multiple Vitamins-Minerals (CENTRUM SILVER ULTRA MENS) TABS Take 1 each by mouth daily.      . vitamin D, CHOLECALCIFEROL, 400 UNITS tablet Take 400 Units by mouth daily.      . [DISCONTINUED] valACYclovir (VALTREX) 1000 MG tablet Take 1 tablet (1,000 mg total) by mouth 3 (three) times daily.  21 tablet  0   No facility-administered encounter medications on file as of 03/22/2013.   BP 144/90  Pulse 70  Temp(Src) 97.9 F (36.6 C) (Oral)  Ht 6' 1.25" (1.861 m)  Wt 261 lb (118.389 kg)  BMI 34.18 kg/m2  SpO2  95%    Review of Systems  Constitutional: Negative for fever, chills, activity change, appetite change, fatigue and unexpected weight change.  Eyes: Negative for visual disturbance.  Respiratory: Negative for cough and shortness of breath.   Cardiovascular: Negative for chest pain, palpitations and leg swelling.  Gastrointestinal: Negative for abdominal pain and abdominal distention.  Genitourinary: Negative  for dysuria, urgency and difficulty urinating.  Musculoskeletal: Negative for arthralgias and gait problem.  Skin: Negative for color change and rash.  Hematological: Negative for adenopathy.  Psychiatric/Behavioral: Negative for sleep disturbance and dysphoric mood. The patient is not nervous/anxious.        Objective:   Physical Exam  Constitutional: He is oriented to person, place, and time. He appears well-developed and well-nourished. No distress.  HENT:  Head: Normocephalic and atraumatic.  Right Ear: External ear normal.  Left Ear: External ear normal.  Nose: Nose normal.  Mouth/Throat: Oropharynx is clear and moist. No oropharyngeal exudate.  Eyes: Conjunctivae and EOM are normal. Pupils are equal, round, and reactive to light. Right eye exhibits no discharge. Left eye exhibits no discharge. No scleral icterus.  Neck: Normal range of motion. Neck supple. No tracheal deviation present. No thyromegaly present.  Cardiovascular: Normal rate, regular rhythm and normal heart sounds.  Exam reveals no gallop and no friction rub.   No murmur heard. Pulmonary/Chest: Effort normal and breath sounds normal. No respiratory distress. He has no wheezes. He has no rales. He exhibits no tenderness.  Musculoskeletal: Normal range of motion. He exhibits no edema.  Lymphadenopathy:    He has no cervical adenopathy.  Neurological: He is alert and oriented to person, place, and time. No cranial nerve deficit. Coordination normal.  Skin: Skin is warm and dry. No rash noted. He is not diaphoretic. No erythema. No pallor.  Psychiatric: He has a normal mood and affect. His behavior is normal. Judgment and thought content normal.          Assessment & Plan:

## 2013-03-22 NOTE — Assessment & Plan Note (Addendum)
General exam normal today. Chronic health issues addressed as described. Appropriate screening performed. We discussed screening for prostate cancer with PSA, however he declines. Health maintenance UTD. Follow up 6-12 months and prn.

## 2013-03-22 NOTE — Assessment & Plan Note (Signed)
Previous h/o iron deficiency on Ferrous gluconate. Will check CBC and ferritin with labs today.

## 2013-03-22 NOTE — Assessment & Plan Note (Signed)
Wt Readings from Last 3 Encounters:  03/22/13 261 lb (118.389 kg)  02/14/13 262 lb (118.842 kg)  12/01/12 259 lb (117.482 kg)   Encouraged continued effort at healthy diet and regular physical activity with goal of weight loss.

## 2013-03-22 NOTE — Assessment & Plan Note (Signed)
BP Readings from Last 3 Encounters:  03/22/13 144/90  02/14/13 110/66  12/01/12 160/98   BP elevated today, however generally has been well controlled. Encouraged continued monitoring of BP. Pt will call if consistently >140/90 at home.

## 2013-03-30 ENCOUNTER — Ambulatory Visit: Payer: Self-pay | Admitting: Ophthalmology

## 2013-04-20 ENCOUNTER — Ambulatory Visit: Payer: Self-pay | Admitting: Ophthalmology

## 2013-07-28 HISTORY — PX: EYE SURGERY: SHX253

## 2013-11-18 ENCOUNTER — Encounter: Payer: Self-pay | Admitting: Internal Medicine

## 2014-03-24 ENCOUNTER — Ambulatory Visit (INDEPENDENT_AMBULATORY_CARE_PROVIDER_SITE_OTHER): Payer: Medicare Other | Admitting: Internal Medicine

## 2014-03-24 ENCOUNTER — Encounter: Payer: Self-pay | Admitting: Internal Medicine

## 2014-03-24 VITALS — BP 148/70 | HR 62 | Temp 98.0°F | Ht 73.0 in | Wt 278.8 lb

## 2014-03-24 DIAGNOSIS — Z0000 Encounter for general adult medical examination without abnormal findings: Secondary | ICD-10-CM

## 2014-03-24 DIAGNOSIS — Z23 Encounter for immunization: Secondary | ICD-10-CM

## 2014-03-24 NOTE — Addendum Note (Signed)
Addended by: Marchia Meiers on: 03/24/2014 04:20 PM   Modules accepted: Orders

## 2014-03-24 NOTE — Assessment & Plan Note (Addendum)
General exam normal today. Pt declines any labwork. Pt declines flu vaccine, as he prefers to have later this year. Prevnar given today.Health maintenance UTD. Encouraged healthy diet and exercise. Follow up 12 months and prn.

## 2014-03-24 NOTE — Progress Notes (Signed)
Pre visit review using our clinic review tool, if applicable. No additional management support is needed unless otherwise documented below in the visit note. 

## 2014-03-24 NOTE — Patient Instructions (Signed)

## 2014-03-24 NOTE — Progress Notes (Signed)
Subjective:    Patient ID: Terry Vaughn, male    DOB: April 13, 1933, 80 y.o.   MRN: [id number]  HPI The patient is here for annual Medicare wellness examination and management of other chronic and acute problems.   The risk factors are reflected in the social history.  The roster of all physicians providing medical care to patient - is listed in the Snapshot section of the chart.  Activities of daily living:  The patient is 100% independent in all ADLs: dressing, toileting, feeding as well as independent mobility. Lives with wife in 1 story home.  Home safety : The patient has smoke detectors and CO detectors in the home. They wear seatbelts.  There are no firearms at home. There is no violence in the home.   There is no risks for hepatitis, STDs or HIV. There is history of blood transfusion from father, 71. They have no travel history to infectious disease endemic areas of the world.  The patient have scheduled to see dentist, Dr. Shirley Muscat, this year. They have seen their eye doctor in the last year (optometrist).  Scheduled for cataract removal bilaterally with Dr. Haskel Khan, [facility]. No hearing difficulty with regard to whispered voices and some television programs.  They have deferred audiologic testing in the last year.    They do not  have excessive sun exposure. Discussed the need for sun protection: hats, long sleeves and use of sunscreen if there is significant sun exposure. (Dr. Roseanne Kaufman is dermatologist)  Diet: the importance of a healthy diet is discussed. He does have a relatively healthy diet. Some dietary indiscretion with travel.  The benefits of regular aerobic exercise were discussed. He walks daily if possible or does yard work.  Depression screen: there are no signs or vegative symptoms of depression- irritability, change in appetite, anhedonia, sadness/tearfullness.  Cognitive assessment: the patient manages all their financial and personal affairs  and is actively engaged. They could relate day,date,year and events.  The following portions of the patient's history were reviewed and updated as appropriate: allergies, current medications, past family history, past medical history,  past surgical history, past social history  and problem list.  Visual acuity was not assessed per patient preference since he has regular follow up with ophthalmologist. Hearing and body mass index were assessed and reviewed.   During the course of the visit the patient was educated and counseled about appropriate screening and preventive services including : fall prevention , diabetes screening, nutrition counseling, colorectal cancer screening, and recommended immunizations.    In regards to HTN, pt has been monitoring BP at home. He did not bring records today. Reports BP well controlled, <140/90.  Review of Systems  Constitutional: Negative for fever, chills, activity change, appetite change, fatigue and unexpected weight change.  Eyes: Negative for visual disturbance.  Respiratory: Negative for cough and shortness of breath.   Cardiovascular: Negative for chest pain, palpitations and leg swelling.  Gastrointestinal: Negative for nausea, vomiting, abdominal pain, diarrhea, constipation and abdominal distention.  Genitourinary: Negative for dysuria, urgency and difficulty urinating.  Musculoskeletal: Negative for arthralgias and gait problem.  Skin: Negative for color change and rash.  Hematological: Negative for adenopathy.  Psychiatric/Behavioral: Negative for sleep disturbance and dysphoric mood. The patient is not nervous/anxious.        Objective:    BP 148/70  Pulse 62  Temp(Src) 98 F (36.7 C) (Oral)  Ht  (1.854 m)  Wt 278 lb 12 oz (126.44 kg)  BMI  36.78 kg/m2  SpO2 95% Physical Exam  Constitutional: He is oriented to person, place, and time. He appears well-developed and well-nourished. No distress.  HENT:  Head: Normocephalic and  atraumatic.  Right Ear: External ear normal.  Left Ear: External ear normal.  Nose: Nose normal.  Mouth/Throat: Oropharynx is clear and moist. No oropharyngeal exudate.  Eyes: Conjunctivae and EOM are normal. Pupils are equal, round, and reactive to light. Right eye exhibits no discharge. Left eye exhibits no discharge. No scleral icterus.  Neck: Normal range of motion. Neck supple. No tracheal deviation present. No thyromegaly present.  Cardiovascular: Normal rate, regular rhythm and normal heart sounds.  Exam reveals no gallop and no friction rub.   No murmur heard. Pulmonary/Chest: Effort normal and breath sounds normal. No respiratory distress. He has no wheezes. He has no rales. He exhibits no tenderness.  Abdominal: Soft. Bowel sounds are normal. He exhibits no distension and no mass. There is no tenderness. There is no rebound and no guarding.  Musculoskeletal: Normal range of motion. He exhibits no edema.  Lymphadenopathy:    He has no cervical adenopathy.  Neurological: He is alert and oriented to person, place, and time. No cranial nerve deficit. Coordination normal.  Skin: Skin is warm and dry. No rash noted. He is not diaphoretic. No erythema. No pallor.  Psychiatric: He has a normal mood and affect. His behavior is normal. Judgment and thought content normal.          Assessment & Plan:   Problem List Items Addressed This Visit     Unprioritized   Medicare annual wellness visit, subsequent - Primary     General exam normal today. Pt declines any labwork. Pt declines flu vaccine, as he prefers to have later this year. Prevnar given today.Health maintenance UTD. Encouraged healthy diet and exercise. Follow up 12 months and prn.            Return in about 1 year (around 03/25/2015) for Wellness Visit.

## 2015-03-29 ENCOUNTER — Encounter: Payer: Medicare Other | Admitting: Internal Medicine

## 2015-10-11 ENCOUNTER — Other Ambulatory Visit: Payer: Self-pay | Admitting: Orthopedic Surgery

## 2015-10-11 DIAGNOSIS — M1612 Unilateral primary osteoarthritis, left hip: Secondary | ICD-10-CM

## 2015-10-19 ENCOUNTER — Ambulatory Visit
Admission: RE | Admit: 2015-10-19 | Discharge: 2015-10-19 | Disposition: A | Discharge status: Home / Self Care | Payer: Medicare Other | Source: Ambulatory Visit | Attending: Orthopedic Surgery | Admitting: Orthopedic Surgery

## 2015-10-19 DIAGNOSIS — M1612 Unilateral primary osteoarthritis, left hip: Secondary | ICD-10-CM | POA: Insufficient documentation

## 2015-11-28 ENCOUNTER — Ambulatory Visit (INDEPENDENT_AMBULATORY_CARE_PROVIDER_SITE_OTHER): Payer: Medicare Other

## 2015-11-28 VITALS — BP 160/72 | HR 77 | Temp 97.1°F | Resp 14 | Ht 73.5 in | Wt 274.8 lb

## 2015-11-28 DIAGNOSIS — Z0000 Encounter for general adult medical examination without abnormal findings: Secondary | ICD-10-CM

## 2015-11-28 NOTE — Progress Notes (Signed)
Subjective:   Terry Vaughn is a 82 y.o. male who presents for Medicare Annual/Subsequent preventive examination.  Review of Systems:  No ROS.  Medicare Wellness Visit.  Cardiac Risk Factors include: male gender;advanced age (>1255men, 61>65 women);hypertension     Objective:    Vitals: BP 160/72 mmHg  Pulse 77  Temp(Src) 97.1 F (36.2 C) (Oral)  Resp 14  Ht 6' 1.5" (1.867 m)  Wt 274 lb 12.8 oz (124.648 kg)  BMI 35.76 kg/m2  SpO2 98%  Body mass index is 35.76 kg/(m^2).  Tobacco History  Smoking status  . Never Smoker   Smokeless tobacco  . Not on file     Counseling given: Not Answered   Past Medical History  Diagnosis Date  . Shingles 1959  . Cellulitis 1986    right leg  . Bursitis 1987    right shoulder  . Bone spur 1988    left heel   Past Surgical History  Procedure Laterality Date  . Tonsillectomy and adenoidectomy  1944  . Appendectomy  1942  . Cholecystectomy  1966  . Total hip arthroplasty  1999    right hip  . Melanoma excision  04.2010    mole removal right arm  . Eye surgery  2015    catarcts extracted   Family History  Problem Relation Age of Onset  . Heart disease Father 3256  . Stroke Sister 322  . Cancer Sister     breast  . Cancer Daughter     breast   History  Sexual Activity  . Sexual Activity: Not Currently    Outpatient Encounter Prescriptions as of 11/28/2015  Medication Sig  . aspirin 81 MG tablet Take 81 mg by mouth daily.  . meloxicam (MOBIC) 15 MG tablet Take 15 mg by mouth daily.  . Multiple Vitamins-Minerals (CENTRUM SILVER ULTRA MENS) TABS Take 1 each by mouth daily.  . vitamin D, CHOLECALCIFEROL, 400 UNITS tablet Take 400 Units by mouth daily.   No facility-administered encounter medications on file as of 11/28/2015.    Activities of Daily Living In your present state of health, do you have any difficulty performing the following activities: 11/28/2015  Hearing? N  Vision? N  Difficulty concentrating or making  decisions? N  Walking or climbing stairs? Y  Dressing or bathing? N  Doing errands, shopping? N  Preparing Food and eating ? N  Using the Toilet? N  In the past six months, have you accidently leaked urine? N  Do you have problems with loss of bowel control? N  Managing your Medications? N  Managing your Finances? N  Housekeeping or managing your Housekeeping? N    Patient Care Team: Shelia MediaJennifer A Walker, MD as PCP - General (Internal Medicine)   Assessment:    This is a routine wellness examination for Terry Vaughn. The goal of the wellness visit is to assist the patient how to close the gaps in care and create a preventative care plan for the patient.   Taking VIT D as appropriate/Osteoporosis reviewed.  Medications reviewed; taking without issues or barriers.  Safety issues reviewed; smoke and carbon monoxide detectors in the home. No firearms in the home. Wears seatbelts when driving or riding with others. No violence in the home.  No identified risk were noted; The patient was oriented x 3; appropriate in dress and manner and no objective failures at ADL's or IADL's.   Obesity; discussed the importance of a healthy diet and exercise.  Education provided.  Patient Concerns:  None at this time.  Follow up with PCP as needed.   Exercise Activities and Dietary recommendations Current Exercise Habits: Home exercise routine, Type of exercise: walking, Time (Minutes): 60, Intensity: Mild  Goals    . Healthy Lifestyle     Stay hydrated and drink plenty of fluids. Low carb foods.  Lean meats and vegetables. Stay active and continue walking and mowing the lawn for exercise.      Fall Risk Fall Risk  11/28/2015 03/24/2014 03/22/2013  Falls in the past year? No No No   Depression Screen PHQ 2/9 Scores 11/28/2015 03/24/2014 03/22/2013 06/02/2012  PHQ - 2 Score 0 0 0 0    Cognitive Testing MMSE - Mini Mental State Exam 11/28/2015  Orientation to time 5  Orientation to Place 5    Registration 3  Attention/ Calculation 5  Recall 3  Language- name 2 objects 2  Language- repeat 1  Language- follow 3 step command 3  Language- read & follow direction 1  Write a sentence 1  Copy design 1  Total score 30    Immunization History  Administered Date(s) Administered  . Hepatitis B 07/06/1992  . Influenza Nasal 03/29/2011  . Influenza Split 05/02/2012, 05/11/2014  . Influenza Whole 05/28/2008  . Influenza-Unspecified 05/02/2015  . Pneumococcal Conjugate-13 03/24/2014  . Pneumococcal Polysaccharide-23 05/28/2000  . Td 05/28/2008  . Tdap 12/27/2010  . Zoster 05/29/2007   Screening Tests Health Maintenance  Topic Date Due  . INFLUENZA VACCINE  02/26/2016  . TETANUS/TDAP  12/26/2020  . ZOSTAVAX  Completed  . PNA vac Low Risk Adult  Completed      Plan:    End of life planning; Advance aging; Advanced directives discussed. Copy of current HCPOA/Living Will on file.   During the course of the visit the patient was educated and counseled about the following appropriate screening and preventive services:   Vaccines to include Pneumoccal, Influenza, Hepatitis B, Td, Zostavax, HCV  Electrocardiogram  Cardiovascular Disease  Colorectal cancer screening  Diabetes screening  Prostate Cancer Screening  Glaucoma screening  Nutrition counseling   Smoking cessation counseling  Patient Instructions (the written plan) was given to the patient.    Ashok Pall, LPN  0/03/8118

## 2015-11-28 NOTE — Progress Notes (Signed)
Annual Wellness Visit as completed by Health Coach was reviewed in full.  

## 2015-11-28 NOTE — Patient Instructions (Addendum)
Mr. Terry Vaughn , Thank you for taking time to come for your Medicare Wellness Visit. I appreciate your ongoing commitment to your health goals. Please review the following plan we discussed and let me know if I can assist you in the future.   Follow up with Dr. Dan HumphreysWalker as needed.   This is a list of the screening recommended for you and due dates:  Health Maintenance  Topic Date Due  . Flu Shot  02/26/2016  . Tetanus Vaccine  12/26/2020  . Shingles Vaccine  Completed  . Pneumonia vaccines  Completed    Health Maintenance, Male A healthy lifestyle and preventative care can promote health and wellness.  Maintain regular health, dental, and eye exams.  Eat a healthy diet. Foods like vegetables, fruits, whole grains, low-fat dairy products, and lean protein foods contain the nutrients you need and are low in calories. Decrease your intake of foods high in solid fats, added sugars, and salt. Get information about a proper diet from your health care provider, if necessary.  Regular physical exercise is one of the most important things you can do for your health. Most adults should get at least 150 minutes of moderate-intensity exercise (any activity that increases your heart rate and causes you to sweat) each week. In addition, most adults need muscle-strengthening exercises on 2 or more days a week.   Maintain a healthy weight. The body mass index (BMI) is a screening tool to identify possible weight problems. It provides an estimate of body fat based on height and weight. Your health care provider can find your BMI and can help you achieve or maintain a healthy weight. For males 20 years and older:  A BMI below 18.5 is considered underweight.  A BMI of 18.5 to 24.9 is normal.  A BMI of 25 to 29.9 is considered overweight.  A BMI of 30 and above is considered obese.  Maintain normal blood lipids and cholesterol by exercising and minimizing your intake of saturated fat. Eat a balanced diet  with plenty of fruits and vegetables. Blood tests for lipids and cholesterol should begin at age 10720 and be repeated every 5 years. If your lipid or cholesterol levels are high, you are over age 80, or you are at high risk for heart disease, you may need your cholesterol levels checked more frequently.Ongoing high lipid and cholesterol levels should be treated with medicines if diet and exercise are not working.  If you smoke, find out from your health care provider how to quit. If you do not use tobacco, do not start.  Lung cancer screening is recommended for adults aged 55-80 years who are at high risk for developing lung cancer because of a history of smoking. A yearly low-dose CT scan of the lungs is recommended for people who have at least a 30-pack-year history of smoking and are current smokers or have quit within the past 15 years. A pack year of smoking is smoking an average of 1 pack of cigarettes a day for 1 year (for example, a 30-pack-year history of smoking could mean smoking 1 pack a day for 30 years or 2 packs a day for 15 years). Yearly screening should continue until the smoker has stopped smoking for at least 15 years. Yearly screening should be stopped for people who develop a health problem that would prevent them from having lung cancer treatment.  If you choose to drink alcohol, do not have more than 2 drinks per day. One drink is considered  to be 12 oz (360 mL) of beer, 5 oz (150 mL) of wine, or 1.5 oz (45 mL) of liquor.  Avoid the use of street drugs. Do not share needles with anyone. Ask for help if you need support or instructions about stopping the use of drugs.  High blood pressure causes heart disease and increases the risk of stroke. High blood pressure is more likely to develop in:  People who have blood pressure in the end of the normal range (100-139/85-89 mm Hg).  People who are overweight or obese.  People who are African American.  If you are 35-27 years of age,  have your blood pressure checked every 3-5 years. If you are 56 years of age or older, have your blood pressure checked every year. You should have your blood pressure measured twice--once when you are at a hospital or clinic, and once when you are not at a hospital or clinic. Record the average of the two measurements. To check your blood pressure when you are not at a hospital or clinic, you can use:  An automated blood pressure machine at a pharmacy.  A home blood pressure monitor.  If you are 64-50 years old, ask your health care provider if you should take aspirin to prevent heart disease.  Diabetes screening involves taking a blood sample to check your fasting blood sugar level. This should be done once every 3 years after age 59 if you are at a normal weight and without risk factors for diabetes. Testing should be considered at a younger age or be carried out more frequently if you are overweight and have at least 1 risk factor for diabetes.  Colorectal cancer can be detected and often prevented. Most routine colorectal cancer screening begins at the age of 24 and continues through age 13. However, your health care provider may recommend screening at an earlier age if you have risk factors for colon cancer. On a yearly basis, your health care provider may provide home test kits to check for hidden blood in the stool. A small camera at the end of a tube may be used to directly examine the colon (sigmoidoscopy or colonoscopy) to detect the earliest forms of colorectal cancer. Talk to your health care provider about this at age 32 when routine screening begins. A direct exam of the colon should be repeated every 5-10 years through age 29, unless early forms of precancerous polyps or small growths are found.  People who are at an increased risk for hepatitis B should be screened for this virus. You are considered at high risk for hepatitis B if:  You were born in a country where hepatitis B occurs  often. Talk with your health care provider about which countries are considered high risk.  Your parents were born in a high-risk country and you have not received a shot to protect against hepatitis B (hepatitis B vaccine).  You have HIV or AIDS.  You use needles to inject street drugs.  You live with, or have sex with, someone who has hepatitis B.  You are a man who has sex with other men (MSM).  You get hemodialysis treatment.  You take certain medicines for conditions like cancer, organ transplantation, and autoimmune conditions.  Hepatitis C blood testing is recommended for all people born from [birth year] through 1963 and any individual with known risk factors for hepatitis C.  Healthy men should no longer receive prostate-specific antigen (PSA) blood tests as part of routine cancer screening. Talk  to your health care provider about prostate cancer screening.  Testicular cancer screening is not recommended for adolescents or adult males who have no symptoms. Screening includes self-exam, a health care provider exam, and other screening tests. Consult with your health care provider about any symptoms you have or any concerns you have about testicular cancer.  Practice safe sex. Use condoms and avoid high-risk sexual practices to reduce the spread of sexually transmitted infections (STIs).  You should be screened for STIs, including gonorrhea and chlamydia if:  You are sexually active and are younger than 24 years.  You are older than 24 years, and your health care provider tells you that you are at risk for this type of infection.  Your sexual activity has changed since you were last screened, and you are at an increased risk for chlamydia or gonorrhea. Ask your health care provider if you are at risk.  If you are at risk of being infected with HIV, it is recommended that you take a prescription medicine daily to prevent HIV infection. This is called pre-exposure prophylaxis (PrEP). You  are considered at risk if:  You are a man who has sex with other men (MSM).  You are a heterosexual man who is sexually active with multiple partners.  You take drugs by injection.  You are sexually active with a partner who has HIV.  Talk with your health care provider about whether you are at high risk of being infected with HIV. If you choose to begin PrEP, you should first be tested for HIV. You should then be tested every 3 months for as long as you are taking PrEP.  Use sunscreen. Apply sunscreen liberally and repeatedly throughout the day. You should seek shade when your shadow is shorter than you. Protect yourself by wearing long sleeves, pants, a wide-brimmed hat, and sunglasses year round whenever you are outdoors.  Tell your health care provider of new moles or changes in moles, especially if there is a change in shape or color. Also, tell your health care provider if a mole is larger than the size of a pencil eraser.  A one-time screening for abdominal aortic aneurysm (AAA) and surgical repair of large AAAs by ultrasound is recommended for men aged 64-75 years who are current or former smokers.  Stay current with your vaccines (immunizations).   This information is not intended to replace advice given to you by your health care provider. Make sure you discuss any questions you have with your health care provider.   Document Released: 01/10/2008 Document Revised: 08/04/2014 Document Reviewed: 12/09/2010 Elsevier Interactive Patient Education [employer].

## 2015-12-04 ENCOUNTER — Encounter: Payer: Self-pay | Admitting: Podiatry

## 2015-12-04 ENCOUNTER — Ambulatory Visit (INDEPENDENT_AMBULATORY_CARE_PROVIDER_SITE_OTHER): Payer: Medicare Other

## 2015-12-04 ENCOUNTER — Ambulatory Visit (INDEPENDENT_AMBULATORY_CARE_PROVIDER_SITE_OTHER): Payer: Medicare Other | Admitting: Podiatry

## 2015-12-04 VITALS — Temp 97.7°F

## 2015-12-04 DIAGNOSIS — L89891 Pressure ulcer of other site, stage 1: Secondary | ICD-10-CM

## 2015-12-04 DIAGNOSIS — L03031 Cellulitis of right toe: Secondary | ICD-10-CM | POA: Diagnosis not present

## 2015-12-04 DIAGNOSIS — R52 Pain, unspecified: Secondary | ICD-10-CM

## 2015-12-04 DIAGNOSIS — L97511 Non-pressure chronic ulcer of other part of right foot limited to breakdown of skin: Secondary | ICD-10-CM

## 2015-12-04 MED ORDER — AMOXICILLIN-POT CLAVULANATE 875-125 MG PO TABS
1.0000 | ORAL_TABLET | Freq: Two times a day (BID) | ORAL | Status: DC
Start: 2015-12-04 — End: 2015-12-13

## 2015-12-04 NOTE — Progress Notes (Signed)
   Subjective:    Patient ID: Barkley BoardsFrank E Gaskins, male    DOB: 04/26/1933, 82 y.o.   MRN: [id number]  HPI  82 year old male presents the office they for concerns of his right third toe becoming red, swollen and he has noticed this toenail is just about to fall off. He denies any drainage or pus denies any red streaks. He has been cleaning the area with peroxide. He is coming not taking any antibiotics. He states that he has hammer toes which starts the callus at the tip of the toe and he was trimming the callus with a nonsterile knife and he cut himself which started the infection. No other complaints.   He has had some numbness and tingling to his feet he is been seen by his primary care physician he had blood work performed and he states he is not diabetic and they were unable to identify cause. Review of Systems  All other systems reviewed and are negative.      Objective:   Physical Exam General: AAO x3, NAD  Dermatological: On the right third toe is edema and erythema to the level of the MPJ. There is epidermal lysis of the distal aspect of the toe and the toenails completely off and not attached the nailbed. During debridement epidermolysis of the nail came off and there is an underlying wound underneath epidermal lysis in the dorsal aspect of the toe the nail bed with a granular wound base. There is also a superficial granular wound of the distal aspect of the toe. There is no probing, undermining or tunneling. There is no fluctuance or crepitus. There is mild malodor. No other open lesions identified at this time.  Vascular: Dorsalis Pedis artery and Posterior Tibial artery pedal pulses are 2/4 bilateral with immedate capillary fill time. Pedal hair growth present.  There is no pain with calf compression, swelling, warmth, erythema.   Neruologic: Sensation decreased with Dorann OuSimms Weinstein monofilament, decreased vibratory sensation.  Musculoskeletal: Rigid hammertoe contractures are present.  No pain, crepitus, or limitation noted with foot and ankle range of motion bilateral. Muscular strength 5/5 in all groups tested bilateral.  Gait: Unassisted, Nonantalgic.     Assessment & Plan:  Right third digit cellulitis with ulceration -Treatment options discussed including all alternatives, risks, and complications -X-rays were obtained and reviewed with the patient. Is no definitive evidence of acute fracture or stress fracture. No definitive evidence of acute also myelitis. No soft tissue emphysema. -The wounds were sharply debrided today as well as epidermal lysis to graunular wound base. There is no probing and therefore did not culture the wound is there is no drainage and is to be a superficial culture. -Start Augmentin which was prescribed -Antibody Fritzi MandesQuentin dressing changes for now. -Offloading pads. Vertical shoe dispensed today. -Monitor for any clinical signs or symptoms of infection and directed to call the office immediately should any occur or go to the ER. -Follow-up  In 1 week or sooner if any problems arise. In the meantime, encouraged to call the office with any questions, concerns, change in symptoms.   Ovid CurdMatthew Wagoner, DPM

## 2015-12-07 ENCOUNTER — Ambulatory Visit: Payer: Self-pay | Admitting: Sports Medicine

## 2015-12-13 ENCOUNTER — Ambulatory Visit (INDEPENDENT_AMBULATORY_CARE_PROVIDER_SITE_OTHER): Payer: Medicare Other | Admitting: Podiatry

## 2015-12-13 ENCOUNTER — Encounter: Payer: Self-pay | Admitting: Podiatry

## 2015-12-13 VITALS — BP 182/92 | HR 83 | Resp 18

## 2015-12-13 DIAGNOSIS — L97511 Non-pressure chronic ulcer of other part of right foot limited to breakdown of skin: Secondary | ICD-10-CM

## 2015-12-13 DIAGNOSIS — L03031 Cellulitis of right toe: Secondary | ICD-10-CM

## 2015-12-13 MED ORDER — AMOXICILLIN-POT CLAVULANATE 875-125 MG PO TABS: 1.0000 | ORAL_TABLET | Freq: Two times a day (BID) | ORAL | Status: DC

## 2015-12-14 ENCOUNTER — Encounter: Payer: Self-pay | Admitting: Podiatry

## 2015-12-14 DIAGNOSIS — L97519 Non-pressure chronic ulcer of other part of right foot with unspecified severity: Secondary | ICD-10-CM | POA: Insufficient documentation

## 2015-12-14 DIAGNOSIS — L03031 Cellulitis of right toe: Secondary | ICD-10-CM | POA: Insufficient documentation

## 2015-12-14 NOTE — Progress Notes (Signed)
Patient ID: Terry BoardsFrank E Vaughn, male   DOB: 11/04/1932, 82 y.o.   MRN: [id number]  Subjective: 82 year old male presents the office they for follow-up of a revision right third toe infection. He has been continuing with Augmentin. Discontinue in the buttock ointment dressing changes as well as a surgical shoe. A total of the toe significantly improved compared to last appointment. Redness has improved although does remain somewhat. No malodor. Denies any systemic complaints such as fevers, chills, nausea, vomiting. No acute changes since last appointment, and no other complaints at this time.   Objective: AAO x3, NAD DP/PT pulses palpable bilaterally, CRT less than 3 seconds Hammertoe contractures are present bilaterally. Superficial wound the distal dorsal aspect of the right third digit which is healing. There is decreased erythema to the distal aspect of the digit however does remain somewhat. There is no fluctuance or crepitus. No ascending cellulitis. No malodor. The wounds are not proving there is no undermining or tunneling. No areas of pinpoint bony tenderness or pain with vibratory sensation. MMT 5/5, ROM WNL. No edema, erythema, increase in warmth to bilateral lower extremities.  No open lesions or pre-ulcerative lesions.  No pain with calf compression, swelling, warmth, erythema  Assessment: Resolving cellulitis, ulceration right third digit  Plan: -All treatment options discussed with the patient including all alternatives, risks, complications.  -Lesions were debrided to healthy wound base. Continue with anti-buttock ointment dressing changes. Refilled Augmentin. Continue surgical shoe for now. -Follow-up in 2 weeks call sooner if any questions or concerns or any worsening symptoms. In any signs or symptoms of worsening infections to go medially to the ER. -Patient encouraged to call the office with any questions, concerns, change in symptoms.   Terry CurdMatthew Natassia Vaughn, DPM

## 2015-12-27 ENCOUNTER — Ambulatory Visit (INDEPENDENT_AMBULATORY_CARE_PROVIDER_SITE_OTHER): Payer: Medicare Other | Admitting: Podiatry

## 2015-12-27 ENCOUNTER — Encounter: Payer: Self-pay | Admitting: Podiatry

## 2015-12-27 DIAGNOSIS — M79674 Pain in right toe(s): Secondary | ICD-10-CM

## 2015-12-27 DIAGNOSIS — L97511 Non-pressure chronic ulcer of other part of right foot limited to breakdown of skin: Secondary | ICD-10-CM | POA: Diagnosis not present

## 2015-12-27 DIAGNOSIS — B351 Tinea unguium: Secondary | ICD-10-CM

## 2015-12-27 DIAGNOSIS — L84 Corns and callosities: Secondary | ICD-10-CM

## 2015-12-27 DIAGNOSIS — M79675 Pain in left toe(s): Secondary | ICD-10-CM

## 2015-12-27 DIAGNOSIS — L03031 Cellulitis of right toe: Secondary | ICD-10-CM

## 2015-12-30 NOTE — Progress Notes (Signed)
Patient ID: Barkley BoardsFrank E Messing, male   DOB: 07/28/1933, 82 y.o.   MRN: [id number]  Subjective: 82 year old male presents the office they for follow-up of evaluation of right third toe infection. He is suspicious course of antibiotics. He states that he is doing much better and the toe is looking much better as well. He reports decreased redness as well as swelling. He has not had any drainage or pus or any malodor. He has been using antibiotic ointment dressing changes daily. He is continuing in the surgical shoe. He states his nails are also elongated has a quarter in the left third toe which causes discomfort.  Denies any systemic complaints such as fevers, chills, nausea, vomiting. No acute changes since last appointment, and no other complaints at this time.   Objective: AAO x3, NAD DP/PT pulses palpable bilaterally, CRT less than 3 seconds Hammertoe contractures are present bilaterally. The distal aspect of the right third toe is a scab overlying the area of the previous wound. Upon treatment the ulceration appears to be healed. There is no drainage or pus. There is no probing, undermining or tunneling. There is some residual erythema to the toe however this is much improved and is faint. There is no increase in warmth. There is no before meals cellulitis. No flexions or crepitus. Hyperkeratotic lesion distal left third toe. No underlying ulceration. No edema, erythema. Nails are hypertrophic, dystrophic, brittle, discolored, elongated 10. No swelling redness or drainage. Tenderness nails 1-5 bilaterally. No other open lesions or pre-ulcerative lesions identified at this time. No pain with calf compression, swelling, warmth, erythema  Assessment: Resolving cellulitis, ulceration right third digit  Plan: -All treatment options discussed with the patient including all alternatives, risks, complications.  -Hyperkeratotic lesion left third toe debrided without complications or bleeding. Also debrided  the callus on the right fourth toe the previous wound and the wound appears to be healed. Continue to monitor for any further skin breakdown. We'll hold off on further antibiotics as the infection appears to be resolving and appears to be much improved. The nails were also debrided 10 without complications or bleeding. Offloading pads were also dispensed. Started transition to regular shoe as tolerated. His any new sores or any problems to call the office medially. Follow up as scheduled or sooner if needed.  Ovid CurdMatthew Wagoner, DPM

## 2016-01-24 ENCOUNTER — Encounter: Payer: Self-pay | Admitting: Podiatry

## 2016-01-24 ENCOUNTER — Ambulatory Visit (INDEPENDENT_AMBULATORY_CARE_PROVIDER_SITE_OTHER): Payer: Medicare Other | Admitting: Podiatry

## 2016-01-24 DIAGNOSIS — L97511 Non-pressure chronic ulcer of other part of right foot limited to breakdown of skin: Secondary | ICD-10-CM

## 2016-01-24 DIAGNOSIS — L84 Corns and callosities: Secondary | ICD-10-CM | POA: Diagnosis not present

## 2016-01-24 NOTE — Progress Notes (Signed)
Patient ID: Terry Vaughn, male   DOB: 12/28/1932, 82 y.o.   MRN: [id number]  Subjective: 82 year old male presents the office they for follow-up of evaluation of right third toe infection. He states he is doing well. He stands a lot the toes turn slightly red and get somewhat swollen however when he does foot elevated the toe is normal color. Denies any drainage or pus coming from the toe. No bruits streaks. He doesn't his third toe does have a painful area on the side of the toe or scrubbing the fourth toe. He is asking vision in the can be done to help separate the toes. No other complaints at this time. Denies any systemic complaints as fevers, chills, nausea, vomiting. No calf pain, chest pain, shortness of breath.  Objective: AAO x3, NAD DP/PT pulses palpable bilaterally, CRT less than 3 seconds Hammertoe contractures are present bilaterally. The distal aspect of the right third toe is a scab overlying the area of the previous wound. There is no evidence of blood within the callus. Upon removal there is no open lesion. There is no drainage or pus. There is some edema to the toe very faint amount of erythema how there is no increase in warmth there is no ascending synovitis. The color change appears to be more resolving infection and due to inflammation as opposed to infection. Small hyperkeratotic lesion lateral aspect of the third toe is abutting the fourth toe. No other open lesions are identified at this time.y. No other open lesions or pre-ulcerative lesions identified at this time. No pain with calf compression, swelling, warmth, erythema  Assessment: Resolving cellulitis, pre-ulcerative lesion right third digit  Plan: -All treatment options discussed with the patient including all alternatives, risks, complications.  -Lesion distal aspect of the third toe as well as the lateral aspect debrided without complications or bleeding. The areas pre-ulcerative and I discussed with monitor closely  for any further skin breakdown. I dispensed a toe separator as well as other offloading pads. Monitor for any signs or symptoms of forcing infection directly to the emergency room should any occur or call the office. -He recently purchased new shoes today. Discussed with him to monitor closely some spine issues and there is any skin irritation to stop wearing them. -Follow-up as scheduled or sooner if any issues are to arise. Call any questions or concerns.  Terry Vaughn, DPM

## 2016-02-21 ENCOUNTER — Ambulatory Visit (INDEPENDENT_AMBULATORY_CARE_PROVIDER_SITE_OTHER): Payer: Medicare Other | Admitting: Podiatry

## 2016-02-21 ENCOUNTER — Encounter: Payer: Self-pay | Admitting: Podiatry

## 2016-02-21 VITALS — BP 146/88 | HR 62 | Resp 12

## 2016-02-21 DIAGNOSIS — L97511 Non-pressure chronic ulcer of other part of right foot limited to breakdown of skin: Secondary | ICD-10-CM | POA: Diagnosis not present

## 2016-02-21 DIAGNOSIS — M2041 Other hammer toe(s) (acquired), right foot: Secondary | ICD-10-CM

## 2016-02-21 DIAGNOSIS — M2042 Other hammer toe(s) (acquired), left foot: Secondary | ICD-10-CM | POA: Diagnosis not present

## 2016-02-25 NOTE — Progress Notes (Signed)
Patient ID: Terry Vaughn, male   DOB: April 05, 1933, 82 y.o.   MRN: [id number]  Subjective: 82 year old male presents the office they for follow-up of evaluation of right third toe infection. He presents today as a noticeable blister form on his toe yesterday. Denies any redness or swelling or any drainage. He's been continuing the offloading pads. Denies any recent injury or trauma. No other complaints at this time. Denies any systemic complaints as fevers, chills, nausea, vomiting. No calf pain, chest pain, shortness of breath.  Objective: AAO x3, NAD DP/PT pulses palpable bilaterally, CRT less than 3 seconds Hammertoe contractures are present bilaterally. The distal aspect of the right third toe is what appears to be a drained serous blister. Upon debridement underlying skin appears to be intact except for very small superficial opening which appears to be of pinpoint abrasion type lesion. There is no drainage or pus. No probing, undermining or tunneling. There is no edema, erythema to the toe.  Hammertoes present bilaterally.  No other open lesions or pre-ulcerative lesions identified at this time. No pain with calf compression, swelling, warmth, erythema  Assessment: Pre-ulcerative lesion right third toe  Plan: -All treatment options discussed with the patient including all alternatives, risks, complications.  -Lesion on the right third toe was debrided to without complications. There is no bleeding during the procedure. Recommended a small amount of anabolic limited to the area followed by a bandage. The offloading pads. Monitor for signs or symptoms of infection to cure the ER call the office should any occur. -Discussed medical long-term this continues been ongoing issue discussed with him possible surgical intervention. -Follow-up as scheduled or sooner if any issues are to arise.  Ovid Curd, DPM

## 2016-03-06 DIAGNOSIS — M7660 Achilles tendinitis, unspecified leg: Secondary | ICD-10-CM | POA: Insufficient documentation

## 2016-03-06 DIAGNOSIS — M79672 Pain in left foot: Secondary | ICD-10-CM | POA: Insufficient documentation

## 2016-03-10 ENCOUNTER — Telehealth: Payer: Self-pay | Admitting: Family

## 2016-03-10 NOTE — Telephone Encounter (Signed)
Please call patient and schedule a routine physical for him.  I see he has been to orthopedic and started on meloxicam- I want to ensure that I have an UTD kidney and liver function labs on him which I dont.

## 2016-03-11 NOTE — Telephone Encounter (Signed)
The patient will call back near the end of the year to schedule. I did explain to his wife that the nurse practitioner is aware that he is taking meloxicam and that he will need labs done to check his kidney and liver function. His wife was very please that the NP was aware and they will certainly follow up.

## 2016-03-11 NOTE — Telephone Encounter (Signed)
Patient has called to schedule appointment per vanessa wade

## 2016-03-11 NOTE — Telephone Encounter (Signed)
Left message for patient to return call back.  

## 2016-03-13 ENCOUNTER — Ambulatory Visit (INDEPENDENT_AMBULATORY_CARE_PROVIDER_SITE_OTHER): Payer: Medicare Other | Admitting: Podiatry

## 2016-03-13 ENCOUNTER — Ambulatory Visit: Payer: Medicare Other | Admitting: Podiatry

## 2016-03-13 DIAGNOSIS — L84 Corns and callosities: Secondary | ICD-10-CM

## 2016-03-17 NOTE — Progress Notes (Signed)
Patient ID: Terry Vaughn, male   DOB: 09/01/1932, 82 y.o.   MRN: [id number]  Subjective: 82 year old male presents the office they for follow-up of evaluation of right third toe ulceration. He states the toes did much better denies any swelling or redness or drainage. Since last appointment he didn't get orthopedics for right posterior heel pain and swelling. His been placed into a cam boot which is been helping. He does have a follow-up with orthopedics for this as well. Started after his last appointment with me. No other complaints at this time. Denies any systemic complaints as fevers, chills, nausea, vomiting. No calf pain, chest pain, shortness of breath.  Objective: AAO x3, NAD DP/PT pulses palpable bilaterally, CRT less than 3 seconds Hammertoe contractures are present bilaterally. At the distal aspect of the right third toe is a small hyperkeratotic lesion. Upon debridement no underlying ulceration, drainage or other signs of infection. No open lesions or pre-ulcerative lesions of the foot otherwise. The posterior aspect of the right heel is continued mild edema as well as pain and slight erythema. Subjective this is greatly improved. There is no pain with calf compression, swelling, warmth, erythema.  Assessment: Pre-ulcerative lesion right third toe  Plan: -All treatment options discussed with the patient including all alternatives, risks, complications.  -Hyperkeratotic lesion was debrided without complications or bleeding. Continue to monitor for any further skin breakdown. -Continue to follow up with orthopedics of the posterior right heel. -Follow-up with me in 9 weeks or sooner pronation S arise. Monitor for signs or symptoms of infection. Call any questions or concerns meantime.  Ovid CurdMatthew Wagoner, DPM

## 2016-04-23 ENCOUNTER — Encounter: Payer: Medicare Other | Admitting: Family

## 2016-04-23 ENCOUNTER — Telehealth: Payer: Self-pay | Admitting: Family

## 2016-04-23 NOTE — Telephone Encounter (Signed)
Thanks Vanessa.

## 2016-04-23 NOTE — Telephone Encounter (Addendum)
I called Dr. Caryn BeeKevin Krasinki's office to get the lab orders that the physician was requesting to be done in our office . They will fax them over today. I called the patient to inform him that as soon as we receive the orders and his physician ok's them we will call back to schedule him for his labs.

## 2016-04-23 NOTE — Telephone Encounter (Signed)
Could orders be placed?

## 2016-04-23 NOTE — Telephone Encounter (Signed)
Pt would like to get labs done before his appt. Need orders please and thank you!  Call pt @ [phone].

## 2016-04-24 NOTE — Telephone Encounter (Signed)
I called Dr.Krasinki's office to follow up on the patient's lab order,because I had not received the fax. I was informed that the physician had not authorized them to send the lab order . Morrie SheldonAshley from Dr.Krasinki's will get the order from the physician when he comes out of the patient room and fax it over to this office.  I called the patient's wife and explained that Dr. Marilynne DriversKrasinki's office had not sent the lab order and I would have to wait to receive the lab order before scheduling an appointment.

## 2016-04-25 ENCOUNTER — Other Ambulatory Visit: Payer: Self-pay | Admitting: Family

## 2016-04-25 ENCOUNTER — Other Ambulatory Visit (INDEPENDENT_AMBULATORY_CARE_PROVIDER_SITE_OTHER): Payer: Medicare Other

## 2016-04-25 DIAGNOSIS — Z7689 Persons encountering health services in other specified circumstances: Secondary | ICD-10-CM

## 2016-04-25 DIAGNOSIS — Z7189 Other specified counseling: Secondary | ICD-10-CM | POA: Diagnosis not present

## 2016-04-25 LAB — COMPREHENSIVE METABOLIC PANEL
ALT: 32 U/L (ref 9–46)
AST: 30 U/L (ref 10–35)
Albumin: 4.3 g/dL (ref 3.6–5.1)
Alkaline Phosphatase: 50 U/L (ref 40–115)
BUN: 16 mg/dL (ref 7–25)
CO2: 29 mmol/L (ref 20–31)
Calcium: 9.3 mg/dL (ref 8.6–10.3)
Chloride: 103 mmol/L (ref 98–110)
Creat: 1.17 mg/dL — ABNORMAL HIGH (ref 0.70–1.11)
Glucose, Bld: 79 mg/dL (ref 65–99)
Potassium: 4.6 mmol/L (ref 3.5–5.3)
Sodium: 142 mmol/L (ref 135–146)
Total Bilirubin: 1.3 mg/dL — ABNORMAL HIGH (ref 0.2–1.2)
Total Protein: 6.5 g/dL (ref 6.1–8.1)

## 2016-04-25 LAB — CBC WITH DIFFERENTIAL/PLATELET
Basophils Absolute: 0 cells/uL (ref 0–200)
Basophils Relative: 0 %
Eosinophils Absolute: 194 cells/uL (ref 15–500)
Eosinophils Relative: 2 %
HCT: 40.7 % (ref 38.5–50.0)
Hemoglobin: 14.2 g/dL (ref 13.2–17.1)
Lymphocytes Relative: 29 %
Lymphs Abs: 2813 cells/uL (ref 850–3900)
MCH: 33.2 pg — ABNORMAL HIGH (ref 27.0–33.0)
MCHC: 34.9 g/dL (ref 32.0–36.0)
MCV: 95.1 fL (ref 80.0–100.0)
MPV: 10.2 fL (ref 7.5–12.5)
Monocytes Absolute: 873 cells/uL (ref 200–950)
Monocytes Relative: 9 %
Neutro Abs: 5820 cells/uL (ref 1500–7800)
Neutrophils Relative %: 60 %
Platelets: 305 10*3/uL (ref 140–400)
RBC: 4.28 MIL/uL (ref 4.20–5.80)
RDW: 14.3 % (ref 11.0–15.0)
WBC: 9.7 10*3/uL (ref 3.8–10.8)

## 2016-04-25 MED ORDER — OMEPRAZOLE 20 MG PO CPDR: 20.0000 mg | DELAYED_RELEASE_CAPSULE | Freq: Every day | ORAL | 3 refills | Status: DC

## 2016-04-25 NOTE — Telephone Encounter (Signed)
Patient schedule for labs today 

## 2016-04-25 NOTE — Telephone Encounter (Signed)
I spoke with Morrie SheldonAshley from Dr. Marilynne DriversKrasinki's office she informed me that the patient did not need labs drawn Dr. Nyoka LintKrasinki wanted to know how long the patient could stay on his meloxicam. He has not had labs since August 2014 does he need labs done sooner than November when he is scheduled for his physical.

## 2016-04-25 NOTE — Telephone Encounter (Signed)
Please advise would you like labs prior tp the physical, and should patient continue the meloxicam.

## 2016-04-25 NOTE — Telephone Encounter (Signed)
Labs ordered.

## 2016-05-08 ENCOUNTER — Telehealth: Payer: Self-pay | Admitting: *Deleted

## 2016-05-08 ENCOUNTER — Ambulatory Visit
Admission: RE | Admit: 2016-05-08 | Discharge: 2016-05-08 | Disposition: A | Discharge status: Home / Self Care | Payer: Medicare Other | Source: Ambulatory Visit | Attending: Family | Admitting: Family

## 2016-05-08 ENCOUNTER — Ambulatory Visit (INDEPENDENT_AMBULATORY_CARE_PROVIDER_SITE_OTHER): Payer: Medicare Other | Admitting: Family

## 2016-05-08 ENCOUNTER — Encounter: Payer: Self-pay | Admitting: Family

## 2016-05-08 VITALS — BP 144/86 | HR 83 | Temp 97.8°F | Wt 272.4 lb

## 2016-05-08 DIAGNOSIS — M1710 Unilateral primary osteoarthritis, unspecified knee: Secondary | ICD-10-CM | POA: Insufficient documentation

## 2016-05-08 DIAGNOSIS — M1712 Unilateral primary osteoarthritis, left knee: Secondary | ICD-10-CM | POA: Diagnosis not present

## 2016-05-08 DIAGNOSIS — I10 Essential (primary) hypertension: Secondary | ICD-10-CM

## 2016-05-08 DIAGNOSIS — M7121 Synovial cyst of popliteal space [Baker], right knee: Secondary | ICD-10-CM | POA: Insufficient documentation

## 2016-05-08 DIAGNOSIS — M7989 Other specified soft tissue disorders: Secondary | ICD-10-CM | POA: Diagnosis not present

## 2016-05-08 DIAGNOSIS — Z7689 Persons encountering health services in other specified circumstances: Secondary | ICD-10-CM | POA: Diagnosis not present

## 2016-05-08 DIAGNOSIS — M179 Osteoarthritis of knee, unspecified: Secondary | ICD-10-CM | POA: Insufficient documentation

## 2016-05-08 NOTE — Patient Instructions (Signed)
Fasting labs at your convenience.   US right leg.   If there is no improvement in your symptoms, or if there is any worsening of symptoms, or if you have any additional concerns, please return for re-evaluation; or, if we are closed, consider going to the Emergency Room for evaluation if symptoms urgent.

## 2016-05-08 NOTE — Telephone Encounter (Signed)
Please mail results to patient. He can take to Dr. Wyn Quakerew at appointment.

## 2016-05-08 NOTE — Telephone Encounter (Signed)
I advised patient and wife of his results.   Please send results to Dr. Carlos Americano's office.   Thanks

## 2016-05-08 NOTE — Assessment & Plan Note (Addendum)
Reviewed past medical social, surgical history with patient. Reviewed family medical history. Patient is no longer doing colonoscopy, or following PSA. He politely declines prostate exam in the absence of urinary symptoms.patient not had lipid, thyroid, regular screening labs ordered in some time, we ordered this today and patient will come back and do this when he is fasting.

## 2016-05-08 NOTE — Assessment & Plan Note (Signed)
Stable. Continue on Mobic and omeprazole.

## 2016-05-08 NOTE — Progress Notes (Signed)
Subjective:    Patient ID: Terry Vaughn, male    DOB: 17-Mar-1933, 82 y.o.   MRN: [id number]  CC: Terry Vaughn is a 82 y.o. male who presents today for follow up.   HPI: Patient here for follow up and to establish are.   OA- Stable. Mobic daily as started by orthopedic; he  follows with [facility] Dr. Windy Fast; no history of GI bleed. He is taking omeprazole daily as well. No kidney disease.  RLE swelling over past month- no pain or fever. had seen Dr Wyn Quaker vascular in past for laser surgery. No h/o DVT, SOB, CP, palpitations. No erythema, warmth of leg, or fever.   No longer follows PSA, colonoscopy.     HISTORY:  Past Medical History:  Diagnosis Date  . Bone spur 1988   left heel  . Bursitis 1987   right shoulder  . Cellulitis 1986   right leg  . Shingles 1959   Past Surgical History:  Procedure Laterality Date  . APPENDECTOMY  1942  . CHOLECYSTECTOMY  1966  . EYE SURGERY  2015   catarcts extracted  . MELANOMA EXCISION  04.2010   mole removal right arm  . TONSILLECTOMY AND ADENOIDECTOMY  1944  . TOTAL HIP ARTHROPLASTY  1999   right hip   Family History  Problem Relation Age of Onset  . Heart disease Father 87  . Stroke Sister 43  . Cancer Sister     breast  . Cancer Daughter     breast    Allergies: Review of patient's allergies indicates no known allergies. Current Outpatient Prescriptions on File Prior to Visit  Medication Sig Dispense Refill  . aspirin 81 MG tablet Take 81 mg by mouth daily.    . meloxicam (MOBIC) 15 MG tablet Take 15 mg by mouth daily.    . Multiple Vitamins-Minerals (CENTRUM SILVER ULTRA MENS) TABS Take 1 each by mouth daily.    Marland Kitchen omeprazole (PRILOSEC) 20 MG capsule Take 1 capsule (20 mg total) by mouth daily. 90 capsule 3  . vitamin D, CHOLECALCIFEROL, 400 UNITS tablet Take 400 Units by mouth daily.     No current facility-administered medications on file prior to visit.     Social History  Substance Use Topics  . Smoking status:  Never Smoker  . Smokeless tobacco: Never Used  . Alcohol use No    Review of Systems  Constitutional: Negative for chills and fever.  HENT: Negative for congestion.   Respiratory: Negative for cough, shortness of breath and wheezing.   Cardiovascular: Positive for leg swelling. Negative for chest pain and palpitations.  Gastrointestinal: Negative for diarrhea, nausea and vomiting.  Genitourinary: Negative for dysuria, frequency and urgency.  Musculoskeletal: Positive for arthralgias (knee pain, chronic). Negative for myalgias.  Skin: Negative for rash.  Neurological: Negative for headaches.  Hematological: Negative for adenopathy.  Psychiatric/Behavioral: Negative for confusion.      Objective:    BP (!) 144/86   Pulse 83   Temp 97.8 F (36.6 C) (Oral)   Wt 272 lb 6.4 oz (123.6 kg)   SpO2 94%   BMI 35.45 kg/m  BP Readings from Last 3 Encounters:  05/08/16 (!) 144/86  02/21/16 (!) 146/88  12/13/15 (!) 182/92   Wt Readings from Last 3 Encounters:  05/08/16 272 lb 6.4 oz (123.6 kg)  11/28/15 274 lb 12.8 oz (124.6 kg)  10/19/15 270 lb (122.5 kg)    Physical Exam  Constitutional: He appears well-developed and well-nourished.  Neck:  No thyroid mass and no thyromegaly present.  Cardiovascular: Regular rhythm and normal heart sounds.   No LE edema, palpable cords or masses. No erythema or increased warmth.  asymmetry in calf size when compared bilaterally with right larger than left. Increased varicosities of right leg. LE hair growth symmetric and present. No discoloration of BLE.  LE warm and palpable pedal pulses.   Pulmonary/Chest: Effort normal and breath sounds normal. No respiratory distress. He has no wheezes. He has no rhonchi. He has no rales.  Lymphadenopathy:       Head (right side): No submental, no submandibular, no tonsillar, no preauricular, no posterior auricular and no occipital adenopathy present.       Head (left side): No submental, no submandibular,  no tonsillar, no preauricular, no posterior auricular and no occipital adenopathy present.    He has no cervical adenopathy.    He has no axillary adenopathy.  Neurological: He is alert.  Skin: Skin is warm and dry.  Psychiatric: He has a normal mood and affect. His speech is normal and behavior is normal.  Vitals reviewed.      Assessment & Plan:   Problem List Items Addressed This Visit      Cardiovascular and Mediastinum   Hypertension    Stable. We'll continue to monitor.        Other   Encounter to establish care - Primary    Reviewed past medical social, surgical history with patient. Reviewed family medical history. Patient is no longer doing colonoscopy, or following PSA. He politely declines prostate exam in the absence of urinary symptoms.patient not had lipid, thyroid, regular screening labs ordered in some time, we ordered this today and patient will come back and do this when he is fasting.      Relevant Orders   Hemoglobin A1c   Lipid panel   TSH   VITAMIN D 25 Hydroxy (Vit-D Deficiency, Fractures)   Leg swelling    New. No Increased erythema, warmth of right lower leg. No SOB. Asymmetry noted with right calf being greater than left. Pending stat Doppler to evaluate for DVT. If negative, suspects related to peripheral vascular disease and have placed referral for patient to see Dr. Wyn Quakerew again.      Relevant Orders   VAS US LOWER EXTREMITY VENOUS (DVT)   Ambulatory referral to Vascular Surgery   US Venous Img Lower Unilateral Right    Other Visit Diagnoses   None.      I have discontinued Mr. Terry Vaughn's amoxicillin-clavulanate. I am also having him maintain his CENTRUM SILVER ULTRA MENS, aspirin, vitamin D (CHOLECALCIFEROL), meloxicam, omeprazole, and FLUZONE HIGH-DOSE.   Meds ordered this encounter  Medications  . FLUZONE HIGH-DOSE 0.5 ML SUSY    Sig: ADM 0.5ML IM UTD    Refill:  0    Return precautions given.   Risks, benefits, and alternatives of  the medications and treatment plan prescribed today were discussed, and patient expressed understanding.   Education regarding symptom management and diagnosis given to patient on AVS.  Continue to follow with Rennie PlowmanMargaret [name], FNP for routine health maintenance.   Barkley BoardsFrank E Zale and I agreed with plan.   Rennie PlowmanMargaret [name], FNP

## 2016-05-08 NOTE — Assessment & Plan Note (Addendum)
New. No Increased erythema, warmth of right lower leg. No SOB. Asymmetry noted with right calf being greater than left. Pending stat Doppler to evaluate for DVT. If negative, suspects related to peripheral vascular disease and have placed referral for patient to see Dr. Wyn Quakerew again.

## 2016-05-08 NOTE — Progress Notes (Signed)
Pre visit review using our clinic review tool, if applicable. No additional management support is needed unless otherwise documented below in the visit note. 

## 2016-05-08 NOTE — Telephone Encounter (Signed)
Pt's wife requested ultrasound results for husband  Contact Garlan FairMinnie [phone]

## 2016-05-08 NOTE — Assessment & Plan Note (Signed)
Stable. We'll continue to monitor. 

## 2016-05-08 NOTE — Telephone Encounter (Signed)
Called patient to inform him of results and left message  Normal US of right leg- no DVT.

## 2016-05-13 NOTE — Telephone Encounter (Signed)
Results have been mailed

## 2016-05-15 ENCOUNTER — Ambulatory Visit: Payer: Medicare Other | Admitting: Podiatry

## 2016-05-16 ENCOUNTER — Other Ambulatory Visit (INDEPENDENT_AMBULATORY_CARE_PROVIDER_SITE_OTHER): Payer: Medicare Other

## 2016-05-16 DIAGNOSIS — Z7689 Persons encountering health services in other specified circumstances: Secondary | ICD-10-CM | POA: Diagnosis not present

## 2016-05-16 LAB — LIPID PANEL
Cholesterol: 143 mg/dL (ref 0–200)
HDL: 38.9 mg/dL — ABNORMAL LOW (ref >39.00)
LDL Cholesterol: 82 mg/dL (ref 0–99)
NonHDL: 103.96
Total CHOL/HDL Ratio: 4
Triglycerides: 109 mg/dL (ref 0.0–149.0)
VLDL: 21.8 mg/dL (ref 0.0–40.0)

## 2016-05-16 LAB — TSH: TSH: 2.11 u[IU]/mL (ref 0.35–4.50)

## 2016-05-16 LAB — HEMOGLOBIN A1C: Hgb A1c MFr Bld: 5.1 % (ref 4.6–6.5)

## 2016-05-16 LAB — VITAMIN D 25 HYDROXY (VIT D DEFICIENCY, FRACTURES): VITD: 40.28 ng/mL (ref 30.00–100.00)

## 2016-05-20 ENCOUNTER — Encounter (INDEPENDENT_AMBULATORY_CARE_PROVIDER_SITE_OTHER): Payer: Self-pay | Admitting: Vascular Surgery

## 2016-05-20 ENCOUNTER — Ambulatory Visit (INDEPENDENT_AMBULATORY_CARE_PROVIDER_SITE_OTHER): Payer: Medicare Other | Admitting: Vascular Surgery

## 2016-05-20 VITALS — BP 165/79 | HR 85 | Resp 16 | Ht 74.0 in | Wt 268.0 lb

## 2016-05-20 DIAGNOSIS — I10 Essential (primary) hypertension: Secondary | ICD-10-CM | POA: Diagnosis not present

## 2016-05-20 DIAGNOSIS — M7989 Other specified soft tissue disorders: Secondary | ICD-10-CM | POA: Diagnosis not present

## 2016-05-20 DIAGNOSIS — I83891 Varicose veins of right lower extremities with other complications: Secondary | ICD-10-CM | POA: Diagnosis not present

## 2016-05-20 DIAGNOSIS — L97519 Non-pressure chronic ulcer of other part of right foot with unspecified severity: Secondary | ICD-10-CM | POA: Insufficient documentation

## 2016-05-20 DIAGNOSIS — L97411 Non-pressure chronic ulcer of right heel and midfoot limited to breakdown of skin: Secondary | ICD-10-CM | POA: Diagnosis not present

## 2016-05-20 NOTE — Assessment & Plan Note (Signed)
Patient has a known history of venous disease and is status post venous intervention about 4 years ago. He is now having worsening swelling and discoloration of his foot, and venous disease of be the most likely culprit. Continue to wear compression stockings and elevate his leg. Check a venous reflux study in the near future at his convenience. We will see the patient back following the study to discuss the results and determine further treatment options. 

## 2016-05-20 NOTE — Assessment & Plan Note (Signed)
Patient has a known history of venous disease and is status post venous intervention about 4 years ago. He is now having worsening swelling and discoloration of his foot, and venous disease of be the most likely culprit. Continue to wear compression stockings and elevate his leg. Check a venous reflux study in the near future at his convenience. We will see the patient back following the study to discuss the results and determine further treatment options.

## 2016-05-20 NOTE — Progress Notes (Signed)
MRN : [id number]  Terry Vaughn is a 82 y.o. (1932-12-06) male who presents with chief complaint of  Chief Complaint  Patient presents with  . Follow-up  .  History of Present Illness: PatientIs referred from the emergency department for evaluation of pain and swelling in the right leg. He was last seen almost 3 years ago for his venous disease. Prominent swelling and purplish discoloration of his foot, ankle, and lower leg on the right. He had a very slow to heal toe ulceration on the third toe several months ago. He has a small ulceration on his right heel which has been very slow to heal as well. This is somewhat painful and creates heaviness and aching in the leg. He has no fever or chills or signs of systemic infection. His left lower extremity is really not that bothered.  Current Outpatient Prescriptions  Medication Sig Dispense Refill  . aspirin 81 MG tablet Take 81 mg by mouth daily.    Marland Kitchen FLUZONE HIGH-DOSE 0.5 ML SUSY ADM 0.5ML IM UTD  0  . meloxicam (MOBIC) 15 MG tablet Take 15 mg by mouth daily.    . Multiple Vitamins-Minerals (CENTRUM SILVER ULTRA MENS) TABS Take 1 each by mouth daily.    Marland Kitchen omeprazole (PRILOSEC) 20 MG capsule Take 1 capsule (20 mg total) by mouth daily. 90 capsule 3  . vitamin D, CHOLECALCIFEROL, 400 UNITS tablet Take 400 Units by mouth daily.     No current facility-administered medications for this visit.     Past Medical History:  Diagnosis Date  . Bone spur 1988   left heel  . Bursitis 1987   right shoulder  . Cellulitis 1986   right leg  . Shingles 1959    Past Surgical History:  Procedure Laterality Date  . APPENDECTOMY  1942  . CHOLECYSTECTOMY  1966  . EYE SURGERY  2015   catarcts extracted  . MELANOMA EXCISION  04.2010   mole removal right arm  . TONSILLECTOMY AND ADENOIDECTOMY  1944  . TOTAL HIP ARTHROPLASTY  1999   right hip    Social History Social History  Substance Use Topics  . Smoking status: Never Smoker  . Smokeless  tobacco: Never Used  . Alcohol use No  Married, lives with wife  Family History Family History  Problem Relation Age of Onset  . Heart disease Father 85  . Stroke Sister 45  . Cancer Sister     breast  . Cancer Daughter     breast    No Known Allergies   REVIEW OF SYSTEMS (Negative unless checked)  Constitutional: _0 Weight loss  _1 Fever  _2 Chills Cardiac: _3 Chest pain   _4 Chest pressure   _5 Palpitations   _6 Shortness of breath when laying flat   _7 Shortness of breath at rest   _8 Shortness of breath with exertion. Vascular:  _9 Pain in legs with walking   _10 Pain in legs at rest   _11 Pain in legs when laying flat   _12 Claudication   _13 Pain in feet when walking  _14 Pain in feet at rest  _15 Pain in feet when laying flat   _16 History of DVT   _17 Phlebitis   _18 Swelling in legs   _19 Varicose veins   _20 Non-healing ulcers Pulmonary:   _21 Uses home oxygen   _22 Productive cough   _23 Hemoptysis   _24 Wheeze  _25 COPD   _26 Asthma Neurologic:  _27 Dizziness  _28 Blackouts   _29 Seizures   _30 History of stroke   _31 History of TIA  _32 Aphasia   _33 Temporary blindness   _34 Dysphagia   _35   Weakness or numbness in arms   _0 Weakness or numbness in legs Musculoskeletal:  _1 Arthritis   _2 Joint swelling   _3 Joint pain   _4 Low back pain Hematologic:  _5 Easy bruising  _6 Easy bleeding   _7 Hypercoagulable state   _8 Anemic   Gastrointestinal:  _9 Blood in stool   _10 Vomiting blood  _11 Gastroesophageal reflux/heartburn   _12 Abdominal pain Genitourinary:  _13 Chronic kidney disease   _14 Difficult urination  _15 Frequent urination  _16 Burning with urination   _17 Hematuria Skin:  _18 Rashes   _19 Ulcers   _20 Wounds Psychological:  _21 History of anxiety   _22  History of major depression.  Physical Examination  BP (!) 165/79   Pulse 85   Resp 16   Ht _23  (1.88 m)   Wt 268 lb (121.6 kg)   BMI 34.41 kg/m  Gen:  WD/WN, NAD Head: [place]/AT, No temporalis wasting. Ear/Nose/Throat: Hearing grossly intact, nares w/o erythema or drainage, trachea  midline Eyes: Conjunctiva clear. Sclera non-icteric Neck: Supple.  No JVD.  Pulmonary:  Good air movement, no use of accessory muscles.  Cardiac: RRR, normal S1, S2 Vascular:  Vessel Right Left  Radial Palpable Palpable  Ulnar Palpable Palpable  Brachial Palpable Palpable  Carotid Palpable, without bruit Palpable, without bruit  Aorta Not palpable N/A  Femoral Palpable Palpable  Popliteal 1+ Palpable Palpable  PT Not Palpable Trace Palpable  DP 1+ Palpable 1+ Palpable   Gastrointestinal: soft, non-tender/non-distended. No guarding/reflex.  Musculoskeletal: M/S 5/5 throughout.  No deformity or atrophy. 1-2+ right lower extremity edema, trace left lower extremity edema. Significant purplish discoloration consistent with venous stasis in the right foot and ankle Neurologic: Sensation grossly intact in extremities.  Symmetrical.  Speech is fluent.  Psychiatric: Judgment intact, Mood & affect appropriate for pt's clinical situation. Dermatologic: Small shallow ulceration on the base of the right heel measuring about 1 cm without surrounding erythema or drainage. Lymph : No Cervical, Axillary, or Inguinal lymphadenopathy.      Labs Recent Results (from the past 2160 hour(s))  CBC w/Diff     Status: Abnormal   Collection Time: 04/25/16  3:47 PM  Result Value Ref Range   WBC 9.7 3.8 - 10.8 K/uL   RBC 4.28 4.20 - 5.80 MIL/uL   Hemoglobin 14.2 13.2 - 17.1 g/dL   HCT 40.7 38.5 - 50.0 %   MCV 95.1 80.0 - 100.0 fL   MCH 33.2 (H) 27.0 - 33.0 pg   MCHC 34.9 32.0 - 36.0 g/dL   RDW 14.3 11.0 - 15.0 %   Platelets 305 140 - 400 K/uL   MPV 10.2 7.5 - 12.5 fL   Neutro Abs 5,820 1,500 - 7,800 cells/uL   Lymphs Abs 2,813 850 - 3,900 cells/uL   Monocytes Absolute 873 200 - 950 cells/uL   Eosinophils Absolute 194 15 - 500 cells/uL   Basophils Absolute 0 0 - 200 cells/uL   Neutrophils Relative % 60 %   Lymphocytes Relative 29 %   Monocytes Relative 9 %   Eosinophils Relative 2 %   Basophils  Relative 0 %   Smear Review Criteria for review not met   Comp Met (CMET)     Status: Abnormal   Collection Time: 04/25/16  3:47 PM  Result Value Ref Range   Sodium 142 135 - 146 mmol/L   Potassium 4.6 3.5 - 5.3 mmol/L   Chloride 103 98 - 110 mmol/L   CO2 29 20 - 31 mmol/L   Glucose, Bld 79 65 - 99 mg/dL   BUN 16 7 - 25  mg/dL   Creat 1.17 (H) 0.70 - 1.11 mg/dL    Comment:   For patients > or = 80 years of age: The upper reference limit for Creatinine is approximately 13% higher for people identified as African-American.      Total Bilirubin 1.3 (H) 0.2 - 1.2 mg/dL   Alkaline Phosphatase 50 40 - 115 U/L   AST 30 10 - 35 U/L   ALT 32 9 - 46 U/L   Total Protein 6.5 6.1 - 8.1 g/dL   Albumin 4.3 3.6 - 5.1 g/dL   Calcium 9.3 8.6 - 10.3 mg/dL  Hemoglobin A1c     Status: None   Collection Time: 05/16/16  8:36 AM  Result Value Ref Range   Hgb A1c MFr Bld 5.1 4.6 - 6.5 %    Comment: Glycemic Control Guidelines for People with Diabetes:Non Diabetic:  <6%Goal of Therapy: <7%Additional Action Suggested:  >8%   Lipid panel     Status: Abnormal   Collection Time: 05/16/16  8:36 AM  Result Value Ref Range   Cholesterol 143 0 - 200 mg/dL    Comment: ATP III Classification       Desirable:  < 200 mg/dL               Borderline High:  200 - 239 mg/dL          High:  > = 240 mg/dL   Triglycerides 109.0 0.0 - 149.0 mg/dL    Comment: Normal:  <150 mg/dLBorderline High:  150 - 199 mg/dL   HDL 38.90 (L) >39.00 mg/dL   VLDL 21.8 0.0 - 40.0 mg/dL   LDL Cholesterol 82 0 - 99 mg/dL   Total CHOL/HDL Ratio 4     Comment:                Men          Women1/2 Average Risk     3.4          3.3Average Risk          5.0          4.42X Average Risk          9.6          7.13X Average Risk          15.0          11.0                       NonHDL 103.96     Comment: NOTE:  Non-HDL goal should be 30 mg/dL higher than patient's LDL goal (i.e. LDL goal of < 70 mg/dL, would have non-HDL goal of < 100 mg/dL)  TSH      Status: None   Collection Time: 05/16/16  8:36 AM  Result Value Ref Range   TSH 2.11 0.35 - 4.50 uIU/mL  VITAMIN D 25 Hydroxy (Vit-D Deficiency, Fractures)     Status: None   Collection Time: 05/16/16  8:36 AM  Result Value Ref Range   VITD 40.28 30.00 - 100.00 ng/mL    Radiology US Venous Img Lower Unilateral Right  Result Date: 05/08/2016 CLINICAL DATA:  Swelling x1 month, redness EXAM: RIGHT LOWER EXTREMITY VENOUS DOPPLER ULTRASOUND TECHNIQUE: Gray-scale sonography with compression, as well as color and duplex ultrasound, were performed to evaluate the deep venous system from the level of the common femoral vein through the popliteal and proximal calf veins. COMPARISON:  None FINDINGS: Normal compressibility of the  common femoral, superficial femoral, and popliteal veins, as well as the proximal calf veins. No filling defects to suggest DVT on grayscale or color Doppler imaging. Doppler waveforms show normal direction of venous flow, normal respiratory phasicity and response to augmentation. Elongated 5.6 x 1.7 cm fluid collection in the posterior popliteal fossa. Survey views of the contralateral common femoral vein are unremarkable. IMPRESSION: 1. No evidence of  lower extremity deep vein thrombosis, right. 2. Baker's cyst. Electronically Signed   By: Lucrezia Europe M.D.   On: 05/08/2016 15:58     Assessment/Plan  Lower limb ulcer, heel or midfoot, right, limited to breakdown of skin ([place]) The patient has a very slow to heal right heel ulceration. His wife reports a third toe ulceration which took months to heal. His pulses are not readily palpable, but with swelling this could be deceiving. I do think it is important to check his arterial status to see if he has adequate perfusion for wound healing. That will be done in the near future at his convenience. We will also be checking his venous status in the near future as well.  Leg swelling Patient has a known history of venous disease and  is status post venous intervention about 4 years ago. He is now having worsening swelling and discoloration of his foot, and venous disease of be the most likely culprit. Continue to wear compression stockings and elevate his leg. Check a venous reflux study in the near future at his convenience. We will see the patient back following the study to discuss the results and determine further treatment options.  Varicose veins of leg with swelling, right Patient has a known history of venous disease and is status post venous intervention about 4 years ago. He is now having worsening swelling and discoloration of his foot, and venous disease of be the most likely culprit. Continue to wear compression stockings and elevate his leg. Check a venous reflux study in the near future at his convenience. We will see the patient back following the study to discuss the results and determine further treatment options.  Hypertension blood pressure control important in reducing the progression of atherosclerotic disease. On appropriate oral medications.     Leotis Pain, MD  05/20/2016 2:17 PM    This note was created with Dragon medical transcription system.  Any errors from dictation are purely unintentional

## 2016-05-20 NOTE — Assessment & Plan Note (Signed)
blood pressure control important in reducing the progression of atherosclerotic disease. On appropriate oral medications.  

## 2016-05-20 NOTE — Assessment & Plan Note (Signed)
The patient has a very slow to heal right heel ulceration. His wife reports a third toe ulceration which took months to heal. His pulses are not readily palpable, but with swelling this could be deceiving. I do think it is important to check his arterial status to see if he has adequate perfusion for wound healing. That will be done in the near future at his convenience. We will also be checking his venous status in the near future as well.

## 2016-05-20 NOTE — Patient Instructions (Signed)

## 2016-05-22 ENCOUNTER — Ambulatory Visit (INDEPENDENT_AMBULATORY_CARE_PROVIDER_SITE_OTHER): Payer: Medicare Other | Admitting: Podiatry

## 2016-05-22 ENCOUNTER — Ambulatory Visit (INDEPENDENT_AMBULATORY_CARE_PROVIDER_SITE_OTHER): Payer: Medicare Other

## 2016-05-22 ENCOUNTER — Encounter: Payer: Self-pay | Admitting: Podiatry

## 2016-05-22 DIAGNOSIS — L97411 Non-pressure chronic ulcer of right heel and midfoot limited to breakdown of skin: Secondary | ICD-10-CM | POA: Diagnosis not present

## 2016-05-22 DIAGNOSIS — M79675 Pain in left toe(s): Secondary | ICD-10-CM | POA: Diagnosis not present

## 2016-05-22 DIAGNOSIS — B351 Tinea unguium: Secondary | ICD-10-CM | POA: Diagnosis not present

## 2016-05-22 DIAGNOSIS — M79674 Pain in right toe(s): Secondary | ICD-10-CM

## 2016-05-22 DIAGNOSIS — L97511 Non-pressure chronic ulcer of other part of right foot limited to breakdown of skin: Secondary | ICD-10-CM | POA: Diagnosis not present

## 2016-05-27 ENCOUNTER — Ambulatory Visit (INDEPENDENT_AMBULATORY_CARE_PROVIDER_SITE_OTHER): Payer: Medicare Other

## 2016-05-27 ENCOUNTER — Encounter (INDEPENDENT_AMBULATORY_CARE_PROVIDER_SITE_OTHER): Payer: Self-pay | Admitting: Vascular Surgery

## 2016-05-27 ENCOUNTER — Ambulatory Visit (INDEPENDENT_AMBULATORY_CARE_PROVIDER_SITE_OTHER): Payer: Medicare Other | Admitting: Vascular Surgery

## 2016-05-27 VITALS — BP 153/72 | HR 71 | Resp 17 | Ht 74.0 in | Wt 263.0 lb

## 2016-05-27 DIAGNOSIS — L97411 Non-pressure chronic ulcer of right heel and midfoot limited to breakdown of skin: Secondary | ICD-10-CM

## 2016-05-27 DIAGNOSIS — I83891 Varicose veins of right lower extremities with other complications: Secondary | ICD-10-CM | POA: Diagnosis not present

## 2016-05-27 DIAGNOSIS — M7989 Other specified soft tissue disorders: Secondary | ICD-10-CM | POA: Diagnosis not present

## 2016-05-27 DIAGNOSIS — I10 Essential (primary) hypertension: Secondary | ICD-10-CM

## 2016-05-28 ENCOUNTER — Telehealth: Payer: Self-pay

## 2016-05-28 NOTE — Telephone Encounter (Signed)
-----   Message from Allegra GranaMargaret G [name], FNP sent at 05/22/2016  1:30 PM EDT ----- Please call patient and make f/u appt.   Got a note from vascular doc and his BP is running high.  Advise him to keep log and bring in with him.

## 2016-05-28 NOTE — Telephone Encounter (Signed)
Patients wife has notified and will comply with instructions. BP Cuff has been calibrated as well.  Patient will set appointment up when he comes home per wife.

## 2016-05-30 ENCOUNTER — Ambulatory Visit (INDEPENDENT_AMBULATORY_CARE_PROVIDER_SITE_OTHER): Payer: Medicare Other | Admitting: Vascular Surgery

## 2016-06-01 NOTE — Progress Notes (Signed)
Subjective: 82 y.o. returns the office today for painful, elongated, thickened toenails which he cannot trim himself. Denies any redness or drainage around the nails. Also states that he is a wound on the bottom of the right heel which started after a blister from wearing a cam boot due to Achilles injury. This has gotten better but does continue. Denies any redness or drainage or any swelling to the area. Denies any acute changes since last appointment and no new complaints today. Had increased swelling to the right leg he had an ultrasound performed relative DVT. She is getting arterial studies performed this week. His follow-up with vascular surgery. Denies any systemic complaints such as fevers, chills, nausea, vomiting.   Objective: AAO 3, NAD DP/PT pulses palpable, CRT less than 3 seconds Nails hypertrophic, dystrophic, elongated, brittle, discolored 10. There is tenderness overlying the nails 1-5 bilaterally. There is no surrounding erythema or drainage along the nail sites. The plantar posterior aspect of the right heel is a area of hyperkeratotic tissue and upon debridement there is a superficial granular wound measuring/1 x 0.6 cm. There is no probing, undermining or tunneling. There is no swelling erythema, ascending cellulitis, fluctuance, crepitus, malodor, drainage or pus. No clinical signs of infection. No open lesions or pre-ulcerative lesions are identified. No other areas of tenderness bilateral lower extremities. No overlying edema, erythema, increased warmth. No pain with calf compression, swelling, warmth, erythema.  Assessment: Patient presents with symptomatic onychomycosis; right heel ulcer  Plan: -Treatment options including alternatives, risks, complications were discussed -Nails sharply debrided 10 without complication/bleeding. -Wound was debrided of the right heel degree in the wound base. Apply a small amount of the necrotic limited to this area daily. Monitor for  infection. Not filled in 3 weeks to call the office or sooner if needed. -Discussed daily foot inspection. If there are any changes, to call the office immediately.  -Follow-up in 3 months or sooner if any problems are to arise. In the meantime, encouraged to call the office with any questions, concerns, changes symptoms.  Ovid CurdMatthew Apolonia Ellwood, DPM

## 2016-06-02 NOTE — Assessment & Plan Note (Signed)
blood pressure control important in reducing the progression of atherosclerotic disease. On appropriate oral medications.  

## 2016-06-02 NOTE — Assessment & Plan Note (Signed)
Venous duplex demonstrates right great saphenous vein reflux as well as a large branch keeping the most proximal portion of the small saphenous vein open with reflux on the right. This is clearly contributing to his swelling and likely ulceration. I discussed the risks and benefits of laser ablation of the right great and small saphenous veins. I have discussed continued compression and elevation as well. He agrees with this plan of care and desires to proceed with right small saphenous vein and right great saphenous vein laser ablation.

## 2016-06-02 NOTE — Assessment & Plan Note (Signed)
Laser ablation planned.

## 2016-06-02 NOTE — Assessment & Plan Note (Signed)
ABIs were checked and were normal.

## 2016-06-02 NOTE — Progress Notes (Signed)
MRN : [id number]  Terry Vaughn is a 82 y.o. (Dec 22, 1932) male who presents with chief complaint of  Chief Complaint  Patient presents with  . Re-evaluation    Right leg swelling  .  History of Present Illness: Patient returns today in follow up of His swelling and ulceration of the right lower extremity. He is doing well without any new complaints. His leg is about the same. He had noninvasive studies demonstrating significant venous reflux in the right great and small saphenous vein as well as normal ABIs consistent with no arterial insufficiency.  Current Outpatient Prescriptions  Medication Sig Dispense Refill  . aspirin 81 MG tablet Take 81 mg by mouth daily.    Marland Kitchen FLUZONE HIGH-DOSE 0.5 ML SUSY ADM 0.5ML IM UTD  0  . meloxicam (MOBIC) 15 MG tablet Take 15 mg by mouth daily.    . Multiple Vitamins-Minerals (CENTRUM SILVER ULTRA MENS) TABS Take 1 each by mouth daily.    Marland Kitchen omeprazole (PRILOSEC) 20 MG capsule Take 1 capsule (20 mg total) by mouth daily. 90 capsule 3  . vitamin D, CHOLECALCIFEROL, 400 UNITS tablet Take 400 Units by mouth daily.     No current facility-administered medications for this visit.     Past Medical History:  Diagnosis Date  . Bone spur 1988   left heel  . Bursitis 1987   right shoulder  . Cellulitis 1986   right leg  . Shingles 1959    Past Surgical History:  Procedure Laterality Date  . APPENDECTOMY  1942  . CHOLECYSTECTOMY  1966  . EYE SURGERY  2015   catarcts extracted  . MELANOMA EXCISION  04.2010   mole removal right arm  . TONSILLECTOMY AND ADENOIDECTOMY  1944  . TOTAL HIP ARTHROPLASTY  1999   right hip    Social History     Social History  Substance Use Topics  . Smoking status: Never Smoker  . Smokeless tobacco: Never Used  . Alcohol use No  Married, lives with wife  Family History       Family History  Problem Relation Age of Onset  . Heart disease Father 62  . Stroke Sister 75  . Cancer Sister     breast  .  Cancer Daughter     breast    No Known Allergies   REVIEW OF SYSTEMS (Negative unless checked)  Constitutional: _0 Weight loss  _1 Fever  _2 Chills Cardiac: _3 Chest pain   _4 Chest pressure   _5 Palpitations   _6 Shortness of breath when laying flat   _7 Shortness of breath at rest   _8 Shortness of breath with exertion. Vascular:  _9 Pain in legs with walking   _10 Pain in legs at rest   _11 Pain in legs when laying flat   _12 Claudication   _13 Pain in feet when walking  _14 Pain in feet at rest  _15 Pain in feet when laying flat   _16 History of DVT   _17 Phlebitis   _18 Swelling in legs   _19 Varicose veins   _20 Non-healing ulcers Pulmonary:   _21 Uses home oxygen   _22 Productive cough   _23 Hemoptysis   _24 Wheeze  _25 COPD   _26 Asthma Neurologic:  _27 Dizziness  _28 Blackouts   _29 Seizures   _30 History of stroke   _31 History of TIA  _32 Aphasia   _33 Temporary blindness   _34 Dysphagia   _35 Weakness or numbness in arms   _36 Weakness or numbness in legs Musculoskeletal:  _37 Arthritis   _38 Joint swelling   _39 Joint pain   _40 Low back pain Hematologic:  _41 Easy bruising  _42 Easy bleeding   _43   Hypercoagulable state   _0 Anemic   Gastrointestinal:  _1 Blood in stool   _2 Vomiting blood  _3 Gastroesophageal reflux/heartburn   _4 Abdominal pain Genitourinary:  _5 Chronic kidney disease   _6 Difficult urination  _7 Frequent urination  _8 Burning with urination   _9 Hematuria Skin:  _10 Rashes   _11 Ulcers   _12 Wounds Psychological:  _13 History of anxiety   _14  History of major depression.  Physical Examination  BP (!) 165/79   Pulse 85   Resp 16   Ht _15  (1.88 m)   Wt 268 lb (121.6 kg)   BMI 34.41 kg/m  Gen:  WD/WN, NAD Head: [place]/AT, No temporalis wasting. Ear/Nose/Throat: Hearing grossly intact, nares w/o erythema or drainage, trachea midline Eyes: Conjunctiva clear. Sclera non-icteric Neck: Supple.  No JVD.  Pulmonary:  Good air movement, no use of accessory muscles.  Cardiac: RRR, normal S1, S2 Vascular:  Vessel Right Left  Radial  Palpable Palpable  Ulnar Palpable Palpable  Brachial Palpable Palpable  Carotid Palpable, without bruit Palpable, without bruit  Aorta Not palpable N/A  Femoral Palpable Palpable  Popliteal 1+ Palpable Palpable  PT Not Palpable Trace Palpable  DP 1+ Palpable 1+ Palpable   Gastrointestinal: soft, non-tender/non-distended. No guarding/reflex.  Musculoskeletal: M/S 5/5 throughout.  No deformity or atrophy. 1-2+ right lower extremity edema, trace left lower extremity edema. Significant purplish discoloration consistent with venous stasis in the right foot and ankle Neurologic: Sensation grossly intact in extremities.  Symmetrical.  Speech is fluent.  Psychiatric: Judgment intact, Mood & affect appropriate for pt's clinical situation. Dermatologic: Small shallow ulceration on the base of the right heel measuring about 1 cm without surrounding erythema or drainage. Lymph : No Cervical, Axillary, or Inguinal lymphadenopathy.   Labs Recent Results (from the past 2160 hour(s))  CBC w/Diff     Status: Abnormal   Collection Time: 04/25/16  3:47 PM  Result Value Ref Range   WBC 9.7 3.8 - 10.8 K/uL   RBC 4.28 4.20 - 5.80 MIL/uL   Hemoglobin 14.2 13.2 - 17.1 g/dL   HCT 40.7 38.5 - 50.0 %   MCV 95.1 80.0 - 100.0 fL   MCH 33.2 (H) 27.0 - 33.0 pg   MCHC 34.9 32.0 - 36.0 g/dL   RDW 14.3 11.0 - 15.0 %   Platelets 305 140 - 400 K/uL   MPV 10.2 7.5 - 12.5 fL   Neutro Abs 5,820 1,500 - 7,800 cells/uL   Lymphs Abs 2,813 850 - 3,900 cells/uL   Monocytes Absolute 873 200 - 950 cells/uL   Eosinophils Absolute 194 15 - 500 cells/uL   Basophils Absolute 0 0 - 200 cells/uL   Neutrophils Relative % 60 %   Lymphocytes Relative 29 %   Monocytes Relative 9 %   Eosinophils Relative 2 %   Basophils Relative 0 %   Smear Review Criteria for review not met   Comp Met (CMET)     Status: Abnormal   Collection Time: 04/25/16  3:47 PM  Result Value Ref Range   Sodium 142 135 - 146 mmol/L   Potassium 4.6 3.5 -  5.3 mmol/L   Chloride 103 98 - 110 mmol/L   CO2 29 20 - 31 mmol/L   Glucose, Bld 79 65 - 99 mg/dL   BUN 16 7 - 25 mg/dL   Creat 1.17 (H) 0.70 - 1.11 mg/dL    Comment:   For patients > or = 80 years of age: The upper reference limit for Creatinine is approximately 13% higher for people identified as African-American.  Total Bilirubin 1.3 (H) 0.2 - 1.2 mg/dL   Alkaline Phosphatase 50 40 - 115 U/L   AST 30 10 - 35 U/L   ALT 32 9 - 46 U/L   Total Protein 6.5 6.1 - 8.1 g/dL   Albumin 4.3 3.6 - 5.1 g/dL   Calcium 9.3 8.6 - 10.3 mg/dL  Hemoglobin A1c     Status: None   Collection Time: 05/16/16  8:36 AM  Result Value Ref Range   Hgb A1c MFr Bld 5.1 4.6 - 6.5 %    Comment: Glycemic Control Guidelines for People with Diabetes:Non Diabetic:  <6%Goal of Therapy: <7%Additional Action Suggested:  >8%   Lipid panel     Status: Abnormal   Collection Time: 05/16/16  8:36 AM  Result Value Ref Range   Cholesterol 143 0 - 200 mg/dL    Comment: ATP III Classification       Desirable:  < 200 mg/dL               Borderline High:  200 - 239 mg/dL          High:  > = 240 mg/dL   Triglycerides 109.0 0.0 - 149.0 mg/dL    Comment: Normal:  <150 mg/dLBorderline High:  150 - 199 mg/dL   HDL 38.90 (L) >39.00 mg/dL   VLDL 21.8 0.0 - 40.0 mg/dL   LDL Cholesterol 82 0 - 99 mg/dL   Total CHOL/HDL Ratio 4     Comment:                Men          Women1/2 Average Risk     3.4          3.3Average Risk          5.0          4.42X Average Risk          9.6          7.13X Average Risk          15.0          11.0                       NonHDL 103.96     Comment: NOTE:  Non-HDL goal should be 30 mg/dL higher than patient's LDL goal (i.e. LDL goal of < 70 mg/dL, would have non-HDL goal of < 100 mg/dL)  TSH     Status: None   Collection Time: 05/16/16  8:36 AM  Result Value Ref Range   TSH 2.11 0.35 - 4.50 uIU/mL  VITAMIN D 25 Hydroxy (Vit-D Deficiency, Fractures)     Status: None   Collection Time: 05/16/16  8:36  AM  Result Value Ref Range   VITD 40.28 30.00 - 100.00 ng/mL    Radiology US Venous Img Lower Unilateral Right  Result Date: 05/08/2016 CLINICAL DATA:  Swelling x1 month, redness EXAM: RIGHT LOWER EXTREMITY VENOUS DOPPLER ULTRASOUND TECHNIQUE: Gray-scale sonography with compression, as well as color and duplex ultrasound, were performed to evaluate the deep venous system from the level of the common femoral vein through the popliteal and proximal calf veins. COMPARISON:  None FINDINGS: Normal compressibility of the common femoral, superficial femoral, and popliteal veins, as well as the proximal calf veins. No filling defects to suggest DVT on grayscale or color Doppler imaging. Doppler waveforms show normal direction of venous flow, normal respiratory phasicity and response to augmentation. Elongated 5.6 x 1.7  cm fluid collection in the posterior popliteal fossa. Survey views of the contralateral common femoral vein are unremarkable. IMPRESSION: 1. No evidence of  lower extremity deep vein thrombosis, right. 2. Baker's cyst. Electronically Signed   By: Lucrezia Europe M.D.   On: 05/08/2016 15:58      Assessment/Plan  Leg swelling Laser ablation planned.   Hypertension blood pressure control important in reducing the progression of atherosclerotic disease. On appropriate oral medications.   Varicose veins of leg with swelling, right Venous duplex demonstrates right great saphenous vein reflux as well as a large branch keeping the most proximal portion of the small saphenous vein open with reflux on the right. This is clearly contributing to his swelling and likely ulceration. I discussed the risks and benefits of laser ablation of the right great and small saphenous veins. I have discussed continued compression and elevation as well. He agrees with this plan of care and desires to proceed with right small saphenous vein and right great saphenous vein laser ablation.  Lower limb ulcer, heel or  midfoot, right, limited to breakdown of skin ([place]) ABIs were checked and were normal.    Leotis Pain, MD  06/02/2016 11:06 AM    This note was created with Dragon medical transcription system.  Any errors from dictation are purely unintentional

## 2016-06-09 ENCOUNTER — Encounter: Payer: Medicare Other | Admitting: Family

## 2016-06-12 ENCOUNTER — Ambulatory Visit (INDEPENDENT_AMBULATORY_CARE_PROVIDER_SITE_OTHER): Payer: Medicare Other | Admitting: Podiatry

## 2016-06-12 ENCOUNTER — Encounter: Payer: Self-pay | Admitting: Podiatry

## 2016-06-12 DIAGNOSIS — L97511 Non-pressure chronic ulcer of other part of right foot limited to breakdown of skin: Secondary | ICD-10-CM | POA: Diagnosis not present

## 2016-06-16 NOTE — Progress Notes (Signed)
Subjective: 82 y.o. percent to the office today for Evaluation of the wound to the bottom of the right heel. He did have his veins tested he states he is going back to have his arteries tested as well. He denies any drainage or pus coming from the wounds but his wife distended there is callus around the area. There is not painful. Denies any red streaks or any foul odor. No other complaints today.Denies any systemic complaints such as fevers, chills, nausea, vomiting.   His wife states of the wound started after been in a CAM boot for an Achilles injury. He has also finished his course of physical therapy for the Achilles tendon injury and he denies any pain at this time.  Objective: AAO 3, NAD DP/PT pulses palpable, CRT less than 3 seconds There is no ulceration to the digits. On the plantar medial aspect of the right heel is hyperkeratotic lesion with central ulceration today. The wound measures about 0.8 x 0.6 cm and appears to be somewhat improved. There is a definite approximate 0.2 cm. There is no probing to bone, undermining or tunneling. There is no swelling erythema, ascending cellulitis, fluctuance, crepitus, malodor. On the lateral portion of the wound along the area of the hyperkeratotic tissue appears to be a area skin fissure. Upon debridement there is a superficial wound. There is no other open lesions or pre-ulcerative lesions. No other areas of tenderness bilateral lower extremities. No overlying edema, erythema, increased warmth. No pain with calf compression, swelling, warmth, erythema.  Assessment: Patient presents right heel ulcer  Plan: -Treatment options including alternatives, risks, complications were discussed -Keratotic tissue/wound was sharply debrided to granular tissue. Continue daily dressing changes as discussed. Antibiotic ointment was applied followed by dressing. Offloading pads were dispensed. -Monitor for any signs or symptoms of infection to the ER should any  occur. -Follow-up as scheduled or sooner if any issues are to arise. In the meantime call any questions or concerns or any changes symptoms.  Ovid CurdMatthew Kinzlie Harney, DPM

## 2016-07-03 ENCOUNTER — Encounter: Payer: Self-pay | Admitting: Podiatry

## 2016-07-03 ENCOUNTER — Ambulatory Visit (INDEPENDENT_AMBULATORY_CARE_PROVIDER_SITE_OTHER): Payer: Medicare Other | Admitting: Podiatry

## 2016-07-03 DIAGNOSIS — L97511 Non-pressure chronic ulcer of other part of right foot limited to breakdown of skin: Secondary | ICD-10-CM

## 2016-07-03 MED ORDER — MUPIROCIN 2 % EX OINT: 1.0000 "application " | TOPICAL_OINTMENT | Freq: Two times a day (BID) | CUTANEOUS | 0 refills | Status: DC

## 2016-07-08 NOTE — Progress Notes (Signed)
Subjective: 82 y.o. percent to the office today for follow-up evaluation of the wound to the bottom of the right heel.he wife has been applying aqua fore to the area daily. Denies any pus but there is some intermittent bleeding from the area. The wound appears to be doing better but there is a crack in the skin next to it that is open causing the bleeding. Denies any redness, warmth, swelling. No other complaints today.Denies any systemic complaints such as fevers, chills, nausea, vomiting.   He is scheduled for vascular testing next week.   His wife states of the wound started after been in a CAM boot for an Achilles injury. He has also finished his course of physical therapy for the Achilles tendon injury and he denies any pain at this time.  Objective: AAO 3, NAD DP/PT pulses palpable, CRT less than 3 seconds There is no ulceration to the digits. On the plantar medial aspect of the right heel is hyperkeratotic lesion. Upon debridement the underlying wound appears to be healed. Adjacent to this area skin fissure with some hyperkeratic tissue overlying the area. Upon debridement this is a superficial fissure measuring approximately 0.4 x 0.2 cm and is linear in nature. Wound bed is granular drainage or pus expressed. There are no clinical signs of infection.  No other areas of tenderness bilateral lower extremities. No overlying edema, erythema, increased warmth. No pain with calf compression, swelling, warmth, erythema.  Assessment: Patient presents right heel ulcer which is healed but new skin fissure adjacent to the wound   Plan: -Treatment options including alternatives, risks, complications were discussed -Keratotic tissue/wound was sharply debrided to granular tissue. Recommended bactroban ointment dressing changes to the fissure daily.  -Monitor for any signs or symptoms of infection to the ER should any occur. -Follow-up as scheduled or sooner if any issues are to arise. In the  meantime call any questions or concerns or any changes symptoms.  Ovid CurdMatthew Wagoner, DPM

## 2016-07-11 ENCOUNTER — Ambulatory Visit (INDEPENDENT_AMBULATORY_CARE_PROVIDER_SITE_OTHER): Payer: Medicare Other | Admitting: Vascular Surgery

## 2016-07-11 ENCOUNTER — Encounter (INDEPENDENT_AMBULATORY_CARE_PROVIDER_SITE_OTHER): Payer: Self-pay | Admitting: Vascular Surgery

## 2016-07-11 VITALS — BP 160/83 | HR 76 | Resp 16

## 2016-07-11 DIAGNOSIS — I83891 Varicose veins of right lower extremities with other complications: Secondary | ICD-10-CM

## 2016-07-11 NOTE — Assessment & Plan Note (Signed)
See below

## 2016-07-11 NOTE — Progress Notes (Signed)
The patient's right lower extremity was sterilely prepped and draped. The ultrasound machine was used to visualize the saphenous vein throughout its course. A segment in the upper calf was selected for access. The saphenous vein was accessed without difficulty using ultrasound guidance with a micro puncture needle. A micro puncture wire and sheath were then placed. A 0.018 wire was placed beyond the saphenofemoral junction through the sheath and the micro puncture sheath was removed. The 65 cm sheath was then placed over the wire and the wire and dilator were removed. The laser fiber was placed through the sheath and its tip was placed approximately 4-5 cm below the saphenofemoral junction. Tumescent anesthesia was then created with a dilute lidocaine solution. Laser energy was then delivered with constant withdrawal of the sheath and laser fiber. Approximately 1567 Joules of energy were delivered over a length of 42 cm using the 1470 Hz [employer] machine at [employer]. Sterile dressings were placed. The patient tolerated the procedure well without complications.  Consideration for SSV ablation was planned as well.  The SSV appeared small and ablated and no segment appeared to need laser ablation.

## 2016-07-16 ENCOUNTER — Ambulatory Visit (INDEPENDENT_AMBULATORY_CARE_PROVIDER_SITE_OTHER): Payer: Medicare Other

## 2016-07-16 DIAGNOSIS — I83891 Varicose veins of right lower extremities with other complications: Secondary | ICD-10-CM

## 2016-07-24 ENCOUNTER — Ambulatory Visit (INDEPENDENT_AMBULATORY_CARE_PROVIDER_SITE_OTHER): Payer: Medicare Other | Admitting: Podiatry

## 2016-07-24 ENCOUNTER — Encounter: Payer: Self-pay | Admitting: Podiatry

## 2016-07-24 DIAGNOSIS — M79675 Pain in left toe(s): Secondary | ICD-10-CM | POA: Diagnosis not present

## 2016-07-24 DIAGNOSIS — M79674 Pain in right toe(s): Secondary | ICD-10-CM | POA: Diagnosis not present

## 2016-07-24 DIAGNOSIS — L84 Corns and callosities: Secondary | ICD-10-CM | POA: Diagnosis not present

## 2016-07-24 DIAGNOSIS — B351 Tinea unguium: Secondary | ICD-10-CM | POA: Diagnosis not present

## 2016-07-24 NOTE — Progress Notes (Addendum)
Subjective: 82 y.o. percent to the office today for follow-up evaluation of the wound to the bottom of the right heel. He has been using Bactroban ointment to the wound daily. His wife is also been using Aquaphor. She states that the wound is healing very well and there is been no drainage or pus or any swelling or redness of the foot. He also states that both of his big toenails as well as some the other nails are thick and discolored causing pain with pressure in shoes. No redness or drainage around the toenail sites.  He has had laser ablation for the veins with vascular surgery.  He is no other complaints today.  Objective: AAO 3, NAD DP/PT pulses palpable, CRT less than 3 seconds There is no ulceration to the digits. On the plantar medial aspect of the right heel is hyperkeratotic lesion. Upon debridement there is too superficial pre-ulcerative lesions however the wound appears to be almost completely healed at this time. There is no drainage or pus. There is no edema. There is no surrounding erythema or ascending synovitis. No fluctuance or crepitus. No malodor. Bilateral hallux as well as right second, left third digit toenails are hypertrophic, dystrophic, brittle, discolored, elongated. There is no swelling erythema or drainage. There is tenderness these nails. No other areas of tenderness bilaterally. No pain with calf compression, swelling, warmth, erythema.  Assessment:Healing ulceration right heel with symptomatic onychomycosis   Plan: -Treatment options including alternatives, risks, complications were discussed -Keratotic tissue/wound was sharply debrided. Continue moisturizer daily. The wound appears to healing is pre-ulcerative today. If the wound opens backup to return to Bactroban ointment -Nail sharply debrided 4 without complications or bleeding. -Monitor for any signs or symptoms of infection to the ER should any occur. -Follow-up as scheduled or sooner if any issues  are to arise. In the meantime call any questions or concerns or any changes symptoms.  Ovid CurdMatthew Johnanna Bakke, DPM

## 2016-08-08 ENCOUNTER — Encounter (INDEPENDENT_AMBULATORY_CARE_PROVIDER_SITE_OTHER): Payer: Self-pay | Admitting: Vascular Surgery

## 2016-08-08 ENCOUNTER — Ambulatory Visit (INDEPENDENT_AMBULATORY_CARE_PROVIDER_SITE_OTHER): Payer: Medicare Other | Admitting: Vascular Surgery

## 2016-08-08 VITALS — BP 168/86 | HR 95 | Resp 16 | Wt 258.0 lb

## 2016-08-08 DIAGNOSIS — I83891 Varicose veins of right lower extremities with other complications: Secondary | ICD-10-CM

## 2016-08-08 DIAGNOSIS — M7989 Other specified soft tissue disorders: Secondary | ICD-10-CM

## 2016-08-08 DIAGNOSIS — I10 Essential (primary) hypertension: Secondary | ICD-10-CM

## 2016-08-08 NOTE — Progress Notes (Signed)
MRN : [id number]  Terry Vaughn is a 83 y.o. (1933/01/29) male who presents with chief complaint of  Chief Complaint  Patient presents with  . Follow-up  .  History of Present Illness: Patient returns today in follow up of venous disease.  He is status post successful ablation of the right great saphenous vein several weeks ago. His swelling has significantly improved. He does have a large prominent varicosity residual in the right medial calf with residual swelling and discomfort in that area.         Current Outpatient Prescriptions  Medication Sig Dispense Refill  . aspirin 81 MG tablet Take 81 mg by mouth daily.    Marland Kitchen FLUZONE HIGH-DOSE 0.5 ML SUSY ADM 0.5ML IM UTD  0  . meloxicam (MOBIC) 15 MG tablet Take 15 mg by mouth daily.    . Multiple Vitamins-Minerals (CENTRUM SILVER ULTRA MENS) TABS Take 1 each by mouth daily.    Marland Kitchen omeprazole (PRILOSEC) 20 MG capsule Take 1 capsule (20 mg total) by mouth daily. 90 capsule 3  . vitamin D, CHOLECALCIFEROL, 400 UNITS tablet Take 400 Units by mouth daily.     No current facility-administered medications for this visit.         Past Medical History:  Diagnosis Date  . Bone spur 1988   left heel  . Bursitis 1987   right shoulder  . Cellulitis 1986   right leg  . Shingles 1959         Past Surgical History:  Procedure Laterality Date  . APPENDECTOMY  1942  . CHOLECYSTECTOMY  1966  . EYE SURGERY  2015   catarcts extracted  . MELANOMA EXCISION  04.2010   mole removal right arm  . TONSILLECTOMY AND ADENOIDECTOMY  1944  . TOTAL HIP ARTHROPLASTY  1999   right hip    Social History     Social History  Substance Use Topics  . Smoking status: Never Smoker  . Smokeless tobacco: Never Used  . Alcohol use No  Married, lives with wife  Family History       Family History  Problem Relation Age of Onset  . Heart disease Father 60  . Stroke Sister 30  . Cancer Sister     breast  .  Cancer Daughter     breast    No Known Allergies   REVIEW OF SYSTEMS(Negative unless checked)  Constitutional: [] Weight loss[] Fever[] Chills Cardiac:[] Chest pain[] Chest pressure[] Palpitations [] Shortness of breath when laying flat [] Shortness of breath at rest [] Shortness of breath with exertion. Vascular: [] Pain in legs with walking[] Pain in legsat rest[] Pain in legs when laying flat [] Claudication [] Pain in feet when walking [] Pain in feet at rest [] Pain in feet when laying flat [] History of DVT [] Phlebitis [x] Swelling in legs [x] Varicose veins [x] Non-healing ulcers Pulmonary: [] Uses home oxygen [] Productive cough[] Hemoptysis [] Wheeze [] COPD [] Asthma Neurologic: [] Dizziness [] Blackouts [] Seizures [] History of stroke [] History of TIA[] Aphasia [] Temporary blindness[] Dysphagia [] Weaknessor numbness in arms [] Weakness or numbnessin legs Musculoskeletal: [] Arthritis [] Joint swelling [] Joint pain [] Low back pain Hematologic:[] Easy bruising[] Easy bleeding [] Hypercoagulable state [] Anemic  Gastrointestinal:[] Blood in stool[] Vomiting blood[] Gastroesophageal reflux/heartburn[] Abdominal pain Genitourinary: [] Chronic kidney disease [] Difficulturination [] Frequenturination [] Burning with urination[] Hematuria Skin: [] Rashes [x] Ulcers [x] Wounds Psychological: [] History of anxiety[] History of major depression.  Physical Examination  BP (!) 165/79  Pulse 85  Resp 16  Ht 6\' 2"  (1.88 m)  Wt 268 lb (121.6 kg)  BMI 34.41 kg/m  Gen: WD/WN, NAD Head: [place]/AT, No temporalis wasting. Ear/Nose/Throat: Hearing grossly intact, nares w/o erythema or drainage, trachea midline Eyes: Conjunctiva  clear. Sclera non-icteric Neck: Supple. No JVD.  Pulmonary: Good air movement, no use of accessory muscles.  Cardiac: RRR, normal S1, S2 Vascular:  Vessel Right Left  Radial  Palpable Palpable  Ulnar Palpable Palpable  Brachial Palpable Palpable  Carotid Palpable, without bruit Palpable, without bruit  Aorta Not palpable N/A  Femoral Palpable Palpable  Popliteal 1+ Palpable Palpable  PT 1+ Palpable Trace Palpable  DP 1+ Palpable 1+ Palpable   Gastrointestinal: soft, non-tender/non-distended. No guarding/reflex.  Musculoskeletal: M/S 5/5 throughout. No deformity or atrophy. 1+ right lower extremity edema, trace left lower extremityedema.Significant purplish discoloration consistent with venous stasis in the right foot and ankle Neurologic: Sensation grossly intact in extremities. Symmetrical. Speech is fluent.  Psychiatric: Judgment intact, Mood & affect appropriate for pt's clinical situation. Dermatologic: Previous ulcer has healed Lymph : No Cervical, Axillary, or Inguinal lymphadenopathy.   Labs Recent Results (from the past 2160 hour(s))  Hemoglobin A1c     Status: None   Collection Time: 05/16/16  8:36 AM  Result Value Ref Range   Hgb A1c MFr Bld 5.1 4.6 - 6.5 %    Comment: Glycemic Control Guidelines for People with Diabetes:Non Diabetic:  <6%Goal of Therapy: <7%Additional Action Suggested:  >8%   Lipid panel     Status: Abnormal   Collection Time: 05/16/16  8:36 AM  Result Value Ref Range   Cholesterol 143 0 - 200 mg/dL    Comment: ATP III Classification       Desirable:  < 200 mg/dL               Borderline High:  200 - 239 mg/dL          High:  > = 960240 mg/dL   Triglycerides 454.0109.0 0.0 - 149.0 mg/dL    Comment: Normal:  <981<150 mg/dLBorderline High:  150 - 199 mg/dL   HDL 19.1438.90 (L) >78.29>39.00 mg/dL   VLDL 56.221.8 0.0 - 13.040.0 mg/dL   LDL Cholesterol 82 0 - 99 mg/dL   Total CHOL/HDL Ratio 4     Comment:                Men          Women1/2 Average Risk     3.4          3.3Average Risk          5.0          4.42X Average Risk          9.6          7.13X Average Risk          15.0          11.0                       NonHDL 103.96     Comment: NOTE:   Non-HDL goal should be 30 mg/dL higher than patient's LDL goal (i.e. LDL goal of < 70 mg/dL, would have non-HDL goal of < 100 mg/dL)  TSH     Status: None   Collection Time: 05/16/16  8:36 AM  Result Value Ref Range   TSH 2.11 0.35 - 4.50 uIU/mL  VITAMIN D 25 Hydroxy (Vit-D Deficiency, Fractures)     Status: None   Collection Time: 05/16/16  8:36 AM  Result Value Ref Range   VITD 40.28 30.00 - 100.00 ng/mL    Radiology No results found.    Assessment/Plan  Leg swelling Improved but not  resolved with laser ablation.  Hypertension blood pressure control important in reducing the progression of atherosclerotic disease. On appropriate oral medications.   Varicose veins of leg with swelling, right Recommend:  The patient has had successful ablation of the previously incompetent saphenous venous system but still has persistent symptoms of pain and swelling that are having a negative impact on daily life and daily activities.  Patient should undergo injection sclerotherapy to treat the residual varicosities.  The risks, benefits and alternative therapies were reviewed in detail with the patient.  All questions were answered.  The patient agrees to proceed with sclerotherapy at their convenience.  The patient will continue wearing the graduated compression stockings and using the over-the-counter pain medications to treat her symptoms.         Festus Barren, MD  08/08/2016 2:12 PM    This note was created with Dragon medical transcription system.  Any errors from dictation are purely unintentional

## 2016-08-08 NOTE — Assessment & Plan Note (Signed)
Improved but not resolved with laser ablation.

## 2016-08-08 NOTE — Assessment & Plan Note (Signed)
blood pressure control important in reducing the progression of atherosclerotic disease. On appropriate oral medications.  

## 2016-08-08 NOTE — Assessment & Plan Note (Signed)
Recommend:  The patient has had successful ablation of the previously incompetent saphenous venous system but still has persistent symptoms of pain and swelling that are having a negative impact on daily life and daily activities.  Patient should undergo injection sclerotherapy to treat the residual varicosities.  The risks, benefits and alternative therapies were reviewed in detail with the patient.  All questions were answered.  The patient agrees to proceed with sclerotherapy at their convenience.  The patient will continue wearing the graduated compression stockings and using the over-the-counter pain medications to treat her symptoms.     

## 2016-08-08 NOTE — Patient Instructions (Signed)
Varicose Vein Surgery, Care After Refer to this sheet in the next few weeks. These instructions provide you with information about caring for yourself after your procedure. Your health care provider may also give you more specific instructions. Your treatment has been planned according to current medical practices, but problems sometimes occur. Call your health care provider if you have any problems or questions after your procedure. What can I expect after the procedure? After your procedure, it is typical to have the following:  Swelling.  Bruising.  Soreness.  Mild skin discoloration.  Slight bleeding at incision sites. Follow these instructions at home:  Take medicines only as directed by your health care provider.  Wear compression stockings as directed by your health care provider. These stockings help to prevent blood clots and reduce swelling in your legs.  There are many different ways to close and cover an incision, including stitches (sutures), skin glue, and adhesive strips. Follow your health care provider's instructions on:  Incision care.  Bandage (dressing) changes and removal.  Incision closure removal.  Wear loose-fitting clothing.  Get regular daily exercise. Walk or ride a stationary bike daily or as directed by your health care provider.  Ask your health care provider when you can return to work. This may depend on the type of work you do.  Be patient with your recovery. It can take up to 4 weeks to get back to your usual activities. Contact a health care provider if:  You have a fever.  You have drainage, redness, swelling, or pain at an incision site.  You develop a cough. Get help right away if:  You pass out.  You have very bad pain in your leg.  You have leg pain that gets worse when you walk.  You have redness or swelling in your leg that is getting worse.  You have trouble breathing.  You cough up blood. This information is not  intended to replace advice given to you by your health care provider. Make sure you discuss any questions you have with your health care provider. Document Released: 03/17/2014 Document Revised: 12/20/2015 Document Reviewed: 12/21/2013 Elsevier Interactive Patient Education  2017 [employer]  

## 2016-08-21 ENCOUNTER — Encounter: Payer: Self-pay | Admitting: Podiatry

## 2016-08-21 ENCOUNTER — Ambulatory Visit (INDEPENDENT_AMBULATORY_CARE_PROVIDER_SITE_OTHER): Payer: Medicare Other | Admitting: Podiatry

## 2016-08-21 DIAGNOSIS — L84 Corns and callosities: Secondary | ICD-10-CM | POA: Diagnosis not present

## 2016-08-21 NOTE — Progress Notes (Signed)
Subjective: 83 y.o. percent to the office today for follow-up evaluation of the wound to the bottom of the right heel. His wife  Has been applying  Aquaphor to the area daily. They deny any surrounding redness or drainage and that it is looking much better. No pain.   He has had laser ablation for the veins with vascular surgery and is going to have further treatments.   He is no other complaints today. No f/c/n/v. No CP, SOB.   Objective: AAO 3, NAD DP/PT pulses palpable, CRT less than 3 seconds There is no ulceration to the digits. On the plantar medial aspect of the right heel is hyperkeratotic lesion. Upon debridement there is no underlying ulceration, drainage, signs of infection. There are no open lesions.  Small hyperkeratotic lesion to the right distal hallux. No ulcer or signs of infection.  Hammertoes are present.  No pain to bilateral feet.  No pain with calf compression, swelling, warmth, erythema.  Assessment: Pre-ulcerative callus x 2 right foot.   Plan: -Treatment options including alternatives, risks, complications were discussed -Hyperkeartic tissue/wound was sharply debrided x 2 without complications or bleeding.  Continue moisturizer daily. The wound appears to healing is pre-ulcerative today. If the wound opens backup to return to Bactroban ointment -Monitor for any signs or symptoms of infection to the ER should any occur. -Follow-up in 9 weeks for routine care given his history of ulcers and multiple foot issues or sooner if any issues are to arise. In the meantime call any questions or concerns or any changes symptoms.  Terry Vaughn, DPM

## 2016-08-26 ENCOUNTER — Ambulatory Visit (INDEPENDENT_AMBULATORY_CARE_PROVIDER_SITE_OTHER): Payer: Medicare Other | Admitting: Vascular Surgery

## 2016-08-26 ENCOUNTER — Encounter (INDEPENDENT_AMBULATORY_CARE_PROVIDER_SITE_OTHER): Payer: Self-pay | Admitting: Vascular Surgery

## 2016-08-26 VITALS — BP 145/81 | HR 103 | Resp 16 | Ht 74.0 in | Wt 258.0 lb

## 2016-08-26 DIAGNOSIS — I83891 Varicose veins of right lower extremities with other complications: Secondary | ICD-10-CM

## 2016-08-26 NOTE — Assessment & Plan Note (Signed)
See sclero note 

## 2016-08-26 NOTE — Progress Notes (Signed)
Barkley BoardsFrank E Silman is a 83 y.o.male who presents with painful varicose veins of the right leg  Past Medical History:  Diagnosis Date  . Bone spur 1988   left heel  . Bursitis 1987   right shoulder  . Cellulitis 1986   right leg  . Shingles 1959    Past Surgical History:  Procedure Laterality Date  . APPENDECTOMY  1942  . CHOLECYSTECTOMY  1966  . EYE SURGERY  2015   catarcts extracted  . MELANOMA EXCISION  04.2010   mole removal right arm  . TONSILLECTOMY AND ADENOIDECTOMY  1944  . TOTAL HIP ARTHROPLASTY  1999   right hip    Current Outpatient Prescriptions  Medication Sig Dispense Refill  . aspirin 81 MG tablet Take 81 mg by mouth daily.    Marland Kitchen. FLUZONE HIGH-DOSE 0.5 ML SUSY ADM 0.5ML IM UTD  0  . meloxicam (MOBIC) 15 MG tablet Take 15 mg by mouth daily.    . Multiple Vitamins-Minerals (CENTRUM SILVER ULTRA MENS) TABS Take 1 each by mouth daily.    . mupirocin ointment (BACTROBAN) 2 % Apply 1 application topically 2 (two) times daily. 22 g 0  . omeprazole (PRILOSEC) 20 MG capsule Take 1 capsule (20 mg total) by mouth daily. 90 capsule 3  . vitamin D, CHOLECALCIFEROL, 400 UNITS tablet Take 400 Units by mouth daily.     No current facility-administered medications for this visit.     No Known Allergies  Indication: Patient presents with symptomatic varicose veins of the right lower extremity.  Procedure: Foam sclerotherapy was performed on the right lower extremity. Using ultrasound guidance, 5 mL of foam Sotradecol was used to inject the varicosities of the right lower extremity. Compression wraps were placed. The patient tolerated the procedure well.

## 2016-09-16 ENCOUNTER — Ambulatory Visit (INDEPENDENT_AMBULATORY_CARE_PROVIDER_SITE_OTHER): Payer: Medicare Other | Admitting: Vascular Surgery

## 2016-09-19 ENCOUNTER — Encounter (INDEPENDENT_AMBULATORY_CARE_PROVIDER_SITE_OTHER): Payer: Self-pay | Admitting: Vascular Surgery

## 2016-09-19 ENCOUNTER — Ambulatory Visit (INDEPENDENT_AMBULATORY_CARE_PROVIDER_SITE_OTHER): Payer: Medicare Other | Admitting: Vascular Surgery

## 2016-09-19 VITALS — BP 153/85 | HR 85 | Resp 17 | Ht 73.0 in | Wt 258.0 lb

## 2016-09-19 DIAGNOSIS — I83891 Varicose veins of right lower extremities with other complications: Secondary | ICD-10-CM

## 2016-09-19 NOTE — Assessment & Plan Note (Signed)
See sclero note 

## 2016-09-19 NOTE — Progress Notes (Signed)
Terry Vaughn isBarkley Boards a 83 y.o.male who presents with painful varicose veins of the right leg  Past Medical History:  Diagnosis Date  . Bone spur 1988   left heel  . Bursitis 1987   right shoulder  . Cellulitis 1986   right leg  . Shingles 1959    Past Surgical History:  Procedure Laterality Date  . APPENDECTOMY  1942  . CHOLECYSTECTOMY  1966  . EYE SURGERY  2015   catarcts extracted  . MELANOMA EXCISION  04.2010   mole removal right arm  . TONSILLECTOMY AND ADENOIDECTOMY  1944  . TOTAL HIP ARTHROPLASTY  1999   right hip    Current Outpatient Prescriptions  Medication Sig Dispense Refill  . aspirin 81 MG tablet Take 81 mg by mouth daily.    Marland Kitchen. FLUZONE HIGH-DOSE 0.5 ML SUSY ADM 0.5ML IM UTD  0  . meloxicam (MOBIC) 15 MG tablet Take 15 mg by mouth daily.    . Multiple Vitamins-Minerals (CENTRUM SILVER ULTRA MENS) TABS Take 1 each by mouth daily.    . mupirocin ointment (BACTROBAN) 2 % Apply 1 application topically 2 (two) times daily. 22 g 0  . omeprazole (PRILOSEC) 20 MG capsule Take 1 capsule (20 mg total) by mouth daily. 90 capsule 3  . vitamin D, CHOLECALCIFEROL, 400 UNITS tablet Take 400 Units by mouth daily.     No current facility-administered medications for this visit.     No Known Allergies  Indication: Patient presents with symptomatic varicose veins of the right lower extremity.  Procedure: Foam sclerotherapy was performed on the right lower extremity. Using ultrasound guidance, 5 mL of foam Sotradecol was used to inject the varicosities of the right lower extremity. Compression wraps were placed. The patient tolerated the procedure well.

## 2016-10-14 ENCOUNTER — Ambulatory Visit (INDEPENDENT_AMBULATORY_CARE_PROVIDER_SITE_OTHER): Payer: Medicare Other | Admitting: Vascular Surgery

## 2016-11-11 ENCOUNTER — Encounter (INDEPENDENT_AMBULATORY_CARE_PROVIDER_SITE_OTHER): Payer: Self-pay | Admitting: Vascular Surgery

## 2016-11-11 ENCOUNTER — Ambulatory Visit (INDEPENDENT_AMBULATORY_CARE_PROVIDER_SITE_OTHER): Payer: Medicare Other | Admitting: Vascular Surgery

## 2016-11-11 VITALS — BP 165/81 | HR 80 | Resp 16 | Wt 255.8 lb

## 2016-11-11 DIAGNOSIS — I10 Essential (primary) hypertension: Secondary | ICD-10-CM

## 2016-11-11 DIAGNOSIS — I83891 Varicose veins of right lower extremities with other complications: Secondary | ICD-10-CM

## 2016-11-11 NOTE — Progress Notes (Signed)
Terry Vaughn is a 83 y.o.male who presents with painful varicose veins of the right leg  Past Medical History:  Diagnosis Date  . Bone spur 1988   left heel  . Bursitis 1987   right shoulder  . Cellulitis 1986   right leg  . Shingles 1959    Past Surgical History:  Procedure Laterality Date  . APPENDECTOMY  1942  . CHOLECYSTECTOMY  1966  . EYE SURGERY  2015   catarcts extracted  . MELANOMA EXCISION  04.2010   mole removal right arm  . TONSILLECTOMY AND ADENOIDECTOMY  1944  . TOTAL HIP ARTHROPLASTY  1999   right hip    Current Outpatient Prescriptions  Medication Sig Dispense Refill  . aspirin 81 MG tablet Take 81 mg by mouth daily.    Marland Kitchen FLUZONE HIGH-DOSE 0.5 ML SUSY ADM 0.5ML IM UTD  0  . meloxicam (MOBIC) 15 MG tablet Take 15 mg by mouth daily.    . Multiple Vitamins-Minerals (CENTRUM SILVER ULTRA MENS) TABS Take 1 each by mouth daily.    . mupirocin ointment (BACTROBAN) 2 % Apply 1 application topically 2 (two) times daily. 22 g 0  . omeprazole (PRILOSEC) 20 MG capsule Take 1 capsule (20 mg total) by mouth daily. 90 capsule 3  . vitamin D, CHOLECALCIFEROL, 400 UNITS tablet Take 400 Units by mouth daily.     No current facility-administered medications for this visit.     No Known Allergies  Indication: Patient presents with symptomatic varicose veins of the right lower extremity.  Procedure: Foam sclerotherapy was performed on the right lower extremity. Using ultrasound guidance, 5 mL of foam Sotradecol was used to inject the varicosities of the right lower extremity. Compression wraps were placed. The patient tolerated the procedure well.

## 2016-11-27 ENCOUNTER — Ambulatory Visit (INDEPENDENT_AMBULATORY_CARE_PROVIDER_SITE_OTHER): Payer: Medicare Other

## 2016-11-27 VITALS — BP 160/65 | HR 84 | Temp 98.4°F | Resp 16 | Ht 73.0 in | Wt 261.0 lb

## 2016-11-27 DIAGNOSIS — Z1389 Encounter for screening for other disorder: Secondary | ICD-10-CM | POA: Diagnosis not present

## 2016-11-27 DIAGNOSIS — Z0000 Encounter for general adult medical examination without abnormal findings: Secondary | ICD-10-CM | POA: Diagnosis not present

## 2016-11-27 NOTE — Progress Notes (Signed)
Subjective:   Terry Vaughn Terry Vaughn is a 83 y.o. male who presents for Medicare Annual/Subsequent preventive examination.  Review of Systems:  No ROS.  Medicare Wellness Visit.  Cardiac Risk Factors include: male gender;advanced age (>5155men, 38>65 women);obesity (BMI >30kg/m2);hypertension     Objective:    Vitals: BP (!) 160/65 (BP Location: Right Arm, Patient Position: Sitting, Cuff Size: Normal)   Pulse 84   Temp 98.4 F (36.9 C)   Resp 16   Ht 6\' 1"  (1.854 m)   Wt 261 lb (118.4 kg)   SpO2 96%   BMI 34.43 kg/m   Body mass index is 34.43 kg/m.  Tobacco History  Smoking Status  . Never Smoker  Smokeless Tobacco  . Never Used     Counseling given: Not Answered   Past Medical History:  Diagnosis Date  . Bone spur 1988   left heel  . Bursitis 1987   right shoulder  . Cellulitis 1986   right leg  . Shingles 1959   Past Surgical History:  Procedure Laterality Date  . APPENDECTOMY  1942  . CHOLECYSTECTOMY  1966  . EYE SURGERY  2015   catarcts extracted  . MELANOMA EXCISION  04.2010   mole removal right arm  . TONSILLECTOMY AND ADENOIDECTOMY  1944  . TOTAL HIP ARTHROPLASTY  1999   right hip   Family History  Problem Relation Age of Onset  . Clotting disorder Mother   . Heart disease Father 2956  . Cancer Sister     breast  . Cerebral aneurysm Brother     [place]  . Cancer Daughter     breast   History  Sexual Activity  . Sexual activity: Not Currently    Outpatient Encounter Prescriptions as of 11/27/2016  Medication Sig  . aspirin 81 MG tablet Take 81 mg by mouth daily.  Marland Kitchen. FLUZONE HIGH-DOSE 0.5 ML SUSY ADM 0.5ML IM UTD  . meloxicam (MOBIC) 15 MG tablet Take 15 mg by mouth daily.  . Multiple Vitamins-Minerals (CENTRUM SILVER ULTRA MENS) TABS Take 1 each by mouth daily.  Marland Kitchen. omeprazole (PRILOSEC) 20 MG capsule Take 1 capsule (20 mg total) by mouth daily.  . vitamin D, CHOLECALCIFEROL, 400 UNITS tablet Take 400 Units by mouth daily.  . [DISCONTINUED]  mupirocin ointment (BACTROBAN) 2 % Apply 1 application topically 2 (two) times daily.   No facility-administered encounter medications on file as of 11/27/2016.     Activities of Daily Living In your present state of health, do you have any difficulty performing the following activities: 11/27/2016 11/28/2015  Hearing? N N  Vision? N N  Difficulty concentrating or making decisions? N N  Walking or climbing stairs? Y Y  Dressing or bathing? Y N  Doing errands, shopping? N N  Preparing Food and eating ? N N  Using the Toilet? N N  In the past six months, have you accidently leaked urine? N N  Do you have problems with loss of bowel control? N N  Managing your Medications? N N  Managing your Finances? N N  Housekeeping or managing your Housekeeping? N N  Some recent data might be hidden    Patient Care Team: Allegra GranaMargaret G [name], FNP as PCP - General (Family Medicine)   Assessment:    This is a routine wellness examination for Terry Vaughn. The goal of the wellness visit is to assist the patient how to close the gaps in care and create a preventative care plan for the patient.  Taking calcium VIT D as appropriate/Osteoporosis risk reviewed.  Medications reviewed; taking without issues or barriers.  Safety issues reviewed; smoke detectors in the home. No firearms in the home.  Wears seatbelts when driving or riding with others. Patient does wear sunscreen or protective clothing when in direct sunlight. No violence in the home.  Depression- PHQ 2 &9 complete.  No verbal communication regarding little pleasure in doing .  Feeling down in regards to limited ROM in legs.  No changes in sleeping, energy, eating, concentrating.  No thoughts of self harm or harm towards others.  Time spent on this topic is 10 minutes.   Patient is alert, normal appearance, oriented to person/place/and time. Correctly identified the president of the [place], recall of 3/3 words, and performing simple calculations.    Patient displays appropriate judgement and can read correct time from watch face.  No new identified risk were noted.  No failures at ADL's or IADL's. Walker/cane in use when ambulating.    BMI- discussed the importance of a healthy diet, water intake and exercise. Educational material provided.   HTN- followed by PCP.  Dental- every six months.  Eye- Visual acuity not assessed per patient preference since they have regular follow up with the ophthalmologist.  Wears corrective lenses.  Sleep patterns- Sleeps 8 hours at night.  Wakes feeling rested.  Naps during the day.  Health maintenance gaps- closed.  Patient Concerns: None at this time. Follow up with PCP as needed.  Exercise Activities and Dietary recommendations  Current Exercise Habits: The patient does not participate in regular exercise at present  Goals    . Healthy Lifestyle          Stay hydrated and drink plenty of fluids. Low carb foods.  Lean meats and vegetables. Chair exercises as discussed and demonstrated      Fall Risk Fall Risk  11/27/2016 05/08/2016 11/28/2015 03/24/2014 03/22/2013  Falls in the past year? No No No No No   Depression Screen PHQ 2/9 Scores 11/27/2016 11/28/2015 03/24/2014 03/22/2013  PHQ - 2 Score 1 0 0 0  PHQ- 9 Score 2 - - -    Cognitive Function MMSE - Mini Mental State Exam 11/28/2015  Orientation to time 5  Orientation to Place 5  Registration 3  Attention/ Calculation 5  Recall 3  Language- name 2 objects 2  Language- repeat 1  Language- follow 3 step command 3  Language- read & follow direction 1  Write a sentence 1  Copy design 1  Total score 30     6CIT Screen 11/27/2016  What Year? 0 points  What month? 0 points  What time? 0 points  Count back from 20 0 points  Months in reverse 0 points  Repeat phrase 0 points  Total Score 0    Immunization History  Administered Date(s) Administered  . Hepatitis B 07/06/1992  . Influenza Nasal 03/29/2011  . Influenza Split  05/02/2012, 05/11/2014  . Influenza Whole 05/28/2008  . Influenza-Unspecified 05/02/2015, 05/06/2016  . Pneumococcal Conjugate-13 03/24/2014  . Pneumococcal Polysaccharide-23 05/28/2000  . Td 05/28/2008  . Tdap 12/27/2010  . Zoster 05/29/2007   Screening Tests Health Maintenance  Topic Date Due  . INFLUENZA VACCINE  02/25/2017  . TETANUS/TDAP  12/26/2020  . PNA vac Low Risk Adult  Completed      Plan:    End of life planning; Advance aging; Advanced directives discussed. Copy of current HCPOA/Living Will on file.    I have personally reviewed and  noted the following in the patient's chart:   . Medical and social history . Use of alcohol, tobacco or illicit drugs  . Current medications and supplements . Functional ability and status . Nutritional status . Physical activity . Advanced directives . List of other physicians . Hospitalizations, surgeries, and ER visits in previous 12 months . Vitals . Screenings to include cognitive, depression, and falls . Referrals and appointments  In addition, I have reviewed and discussed with patient certain preventive protocols, quality metrics, and best practice recommendations. A written personalized care plan for preventive services as well as general preventive health recommendations were provided to patient.     Ashok Pall, LPN  08/02/1094 Agree with plan. Rennie Plowman, NP

## 2016-11-27 NOTE — Patient Instructions (Addendum)
  Mr. Terry Vaughn , Thank you for taking time to come for your Medicare Wellness Visit. I appreciate your ongoing commitment to your health goals. Please review the following plan we discussed and let me know if I can assist you in the future.   Follow up with Rennie PlowmanMargaret [name], FNP as needed.    Prepare a FIRE evacuation plan.  Have a great day!  These are the goals we discussed: Goals    . Healthy Lifestyle          Stay hydrated and drink plenty of fluids. Low carb foods.  Lean meats and vegetables. Chair exercises as discussed and demonstrated       This is a list of the screening recommended for you and due dates:  Health Maintenance  Topic Date Due  . Flu Shot  02/25/2017  . Tetanus Vaccine  12/26/2020  . Pneumonia vaccines  Completed

## 2016-12-08 ENCOUNTER — Ambulatory Visit (INDEPENDENT_AMBULATORY_CARE_PROVIDER_SITE_OTHER): Payer: Medicare Other | Admitting: Podiatry

## 2016-12-08 ENCOUNTER — Encounter: Payer: Self-pay | Admitting: Podiatry

## 2016-12-08 DIAGNOSIS — M2030 Hallux varus (acquired), unspecified foot: Secondary | ICD-10-CM

## 2016-12-08 DIAGNOSIS — M79676 Pain in unspecified toe(s): Secondary | ICD-10-CM | POA: Diagnosis not present

## 2016-12-08 DIAGNOSIS — L84 Corns and callosities: Secondary | ICD-10-CM

## 2016-12-08 DIAGNOSIS — B351 Tinea unguium: Secondary | ICD-10-CM

## 2016-12-08 NOTE — Progress Notes (Signed)
Complaint:  Visit Type: Patient returns to my office for continued preventative foot care services. Complaint: Patient states" my nails have grown long and thick and become painful to walk and wear shoes" Patient says he has two painful calluses on his right foot.  One callus on the inside right heel and one at tip right big toe. The patient presents for preventative foot care services. No changes to ROS  Podiatric Exam: Vascular: dorsalis pedis and posterior tibial pulses are palpable bilateral. Capillary return is immediate. Temperature gradient is WNL. Skin turgor WNL  Sensorium: Normal Semmes Weinstein monofilament test. Normal tactile sensation bilaterally. Nail Exam: Pt has thick disfigured discolored nails with subungual debris noted bilateral entire nail hallux through fifth toenails Ulcer Exam: Pre-ulcerous callus at distal aspect medial border right hallux. Orthopedic Exam: Muscle tone and strength are WNL. No limitations in general ROM. No crepitus or effusions noted. Foot type and digits show no abnormalities. Bony prominences are unremarkable. Skin: No Porokeratosis. No infection or ulcers.  Callus noted medial aspect right heel.  Diagnosis:  Onychomycosis, , Pain in right toe, pain in left toes  Callus right foot.  Treatment & Plan Procedures and Treatment: Consent by patient was obtained for treatment procedures. The patient understood the discussion of treatment and procedures well. All questions were answered thoroughly reviewed. Debridement of mycotic and hypertrophic toenails, 1 through 5 bilateral and clearing of subungual debris. No ulceration, no infection noted. Debride callus.  His hallux callus formed due to the medial aspect right hallux toenail not being attached to nail bed. Return Visit-Office Procedure: Patient instructed to return to the office for a follow up visit 3 months for continued evaluation and treatment.    Helane GuntherGregory Cabela Pacifico DPM

## 2017-02-13 ENCOUNTER — Ambulatory Visit (INDEPENDENT_AMBULATORY_CARE_PROVIDER_SITE_OTHER): Payer: Medicare Other | Admitting: Vascular Surgery

## 2017-03-10 ENCOUNTER — Ambulatory Visit (INDEPENDENT_AMBULATORY_CARE_PROVIDER_SITE_OTHER): Payer: Medicare Other | Admitting: Vascular Surgery

## 2017-03-10 ENCOUNTER — Encounter (INDEPENDENT_AMBULATORY_CARE_PROVIDER_SITE_OTHER): Payer: Self-pay | Admitting: Vascular Surgery

## 2017-03-10 VITALS — BP 154/74 | HR 79 | Ht 74.0 in | Wt 248.0 lb

## 2017-03-10 DIAGNOSIS — I10 Essential (primary) hypertension: Secondary | ICD-10-CM

## 2017-03-10 DIAGNOSIS — I83891 Varicose veins of right lower extremities with other complications: Secondary | ICD-10-CM | POA: Diagnosis not present

## 2017-03-10 DIAGNOSIS — M7989 Other specified soft tissue disorders: Secondary | ICD-10-CM | POA: Diagnosis not present

## 2017-03-10 NOTE — Progress Notes (Signed)
MRN : [id number]  Terry Vaughn is a 83 y.o. (07/23/1933) male who presents with chief complaint of  Chief Complaint  Patient presents with  . Follow-up  .  History of Present Illness: Patient returns today in follow up of leg swelling and venous insufficiency.He has done quite well after laser ablation last year. He has other leg treated several years ago. His swelling is improved and he no longer has the marked stasis discoloration. No recurrent ulceration or infection.  Current Outpatient Prescriptions  Medication Sig Dispense Refill  . aspirin 81 MG tablet Take 81 mg by mouth daily.    Marland Kitchen. FLUZONE HIGH-DOSE 0.5 ML SUSY ADM 0.5ML IM UTD  0  . meloxicam (MOBIC) 15 MG tablet Take 15 mg by mouth daily.    . Multiple Vitamins-Minerals (CENTRUM SILVER ULTRA MENS) TABS Take 1 each by mouth daily.    Marland Kitchen. omeprazole (PRILOSEC) 20 MG capsule Take 1 capsule (20 mg total) by mouth daily. 90 capsule 3  . Omeprazole 20 MG TBEC omeprazole 20 mg capsule,delayed release    . vitamin D, CHOLECALCIFEROL, 400 UNITS tablet Take 400 Units by mouth daily.     No current facility-administered medications for this visit.     Past Medical History:  Diagnosis Date  . Bone spur 1988   left heel  . Bursitis 1987   right shoulder  . Cellulitis 1986   right leg  . Shingles 1959    Past Surgical History:  Procedure Laterality Date  . APPENDECTOMY  1942  . CHOLECYSTECTOMY  1966  . EYE SURGERY  2015   catarcts extracted  . MELANOMA EXCISION  04.2010   mole removal right arm  . TONSILLECTOMY AND ADENOIDECTOMY  1944  . TOTAL HIP ARTHROPLASTY  1999   right hip     Social History     Social History  Substance Use Topics  . Smoking status: Never Smoker  . Smokeless tobacco: Never Used  . Alcohol use No  Married, lives with wife  Family History       Family History  Problem Relation Age of Onset  . Heart disease Father 4056  . Stroke Sister 6922  . Cancer Sister     breast  .  Cancer Daughter     breast    No Known Allergies   REVIEW OF SYSTEMS(Negative unless checked)  Constitutional: [] Weight loss[] Fever[] Chills Cardiac:[] Chest pain[] Chest pressure[] Palpitations [] Shortness of breath when laying flat [] Shortness of breath at rest [] Shortness of breath with exertion. Vascular: [] Pain in legs with walking[] Pain in legsat rest[] Pain in legs when laying flat [] Claudication [] Pain in feet when walking [] Pain in feet at rest [] Pain in feet when laying flat [] History of DVT [] Phlebitis [x] Swelling in legs [x] Varicose veins [x] Non-healing ulcers Pulmonary: [] Uses home oxygen [] Productive cough[] Hemoptysis [] Wheeze [] COPD [] Asthma Neurologic: [] Dizziness [] Blackouts [] Seizures [] History of stroke [] History of TIA[] Aphasia [] Temporary blindness[] Dysphagia [] Weaknessor numbness in arms [] Weakness or numbnessin legs Musculoskeletal: [x] Arthritis [] Joint swelling [] Joint pain [] Low back pain Hematologic:[] Easy bruising[] Easy bleeding [] Hypercoagulable state [] Anemic  Gastrointestinal:[] Blood in stool[] Vomiting blood[] Gastroesophageal reflux/heartburn[] Abdominal pain Genitourinary: [] Chronic kidney disease [] Difficulturination [] Frequenturination [] Burning with urination[] Hematuria Skin: [] Rashes [x] Ulcers [x] Wounds Psychological: [] History of anxiety[] History of major depression.   Physical Examination  BP (!) 154/74   Pulse 79   Ht 6\' 2"  (1.88 m)   Wt 248 lb (112.5 kg)   BMI 31.84 kg/m  Gen:  WD/WN, NAD Head: [place]/AT, No temporalis wasting. Ear/Nose/Throat: Hearing grossly intact, nares w/o erythema or drainage, trachea midline Eyes: Conjunctiva clear. Sclera non-icteric  Neck: Supple.  No JVD.  Pulmonary:  Good air movement, no use of accessory muscles.  Cardiac: RRR, normal S1, S2 Vascular:  Vessel Right Left  Radial Palpable Palpable                                     Musculoskeletal: M/S 5/5 throughout.  No deformity or atrophy. 1+ bilateral lower extremity edema. Scattered varicosities bilaterally. Walks with a walker Neurologic: Sensation grossly intact in extremities.  Symmetrical.  Speech is fluent.  Psychiatric: Judgment intact, Mood & affect appropriate for pt's clinical situation. Dermatologic: No rashes or ulcers noted.  No cellulitis or open wounds.      Labs No results found for this or any previous visit (from the past 2160 hour(s)).  Radiology No results found.   Assessment/Plan  Leg swelling Better after laser ablation. Continue compression and elevation  Hypertension blood pressure control important in reducing the progression of atherosclerotic disease. On appropriate oral medications.   Varicose veins of leg with swelling, right Marked improvement after laser ablation last year. Seems to be doing well. Continue compression, elevation, and increased activity. I will see him back as needed at this point.    Terry Barren, MD  03/10/2017 11:19 AM    This note was created with Dragon medical transcription system.  Any errors from dictation are purely unintentional

## 2017-03-10 NOTE — Assessment & Plan Note (Signed)
Better after laser ablation. Continue compression and elevation

## 2017-03-10 NOTE — Assessment & Plan Note (Signed)
Marked improvement after laser ablation last year. Seems to be doing well. Continue compression, elevation, and increased activity. I will see him back as needed at this point.

## 2017-03-10 NOTE — Assessment & Plan Note (Signed)
blood pressure control important in reducing the progression of atherosclerotic disease. On appropriate oral medications.  

## 2017-03-16 ENCOUNTER — Ambulatory Visit (INDEPENDENT_AMBULATORY_CARE_PROVIDER_SITE_OTHER): Payer: Medicare Other | Admitting: Podiatry

## 2017-03-16 ENCOUNTER — Encounter: Payer: Self-pay | Admitting: Podiatry

## 2017-03-16 DIAGNOSIS — M79676 Pain in unspecified toe(s): Secondary | ICD-10-CM

## 2017-03-16 DIAGNOSIS — B351 Tinea unguium: Secondary | ICD-10-CM | POA: Diagnosis not present

## 2017-03-16 DIAGNOSIS — M2030 Hallux varus (acquired), unspecified foot: Secondary | ICD-10-CM

## 2017-03-16 DIAGNOSIS — L84 Corns and callosities: Secondary | ICD-10-CM

## 2017-03-16 NOTE — Progress Notes (Signed)
Complaint:  Visit Type: Patient returns to my office for continued preventative foot care services. Complaint: Patient states" my nails have grown long and thick and become painful to walk and wear shoes" Patient says he has two painful calluses on his right foot.  One callus on the inside right heel and one at tip right big toe. The patient presents for preventative foot care services. No changes to ROS  Podiatric Exam: Vascular: dorsalis pedis and posterior tibial pulses are palpable bilateral. Capillary return is immediate. Temperature gradient is WNL. Skin turgor WNL  Sensorium: Normal Semmes Weinstein monofilament test. Normal tactile sensation bilaterally. Nail Exam: Pt has thick disfigured discolored nails with subungual debris noted bilateral entire nail hallux through fifth toenails Ulcer Exam: Pre-ulcerous callus at distal aspect medial border right hallux. Orthopedic Exam: Muscle tone and strength are WNL. No limitations in general ROM. No crepitus or effusions noted. Foot type and digits show no abnormalities. Bony prominences are unremarkable. Skin: No Porokeratosis. No infection or ulcers.  Callus noted medial aspect right heel. Callus right hallux distally asymptomatic.  Diagnosis:  Onychomycosis, , Pain in right toe, pain in left toes  Callus right foot.  Treatment & Plan Procedures and Treatment: Consent by patient was obtained for treatment procedures. The patient understood the discussion of treatment and procedures well. All questions were answered thoroughly reviewed. Debridement of mycotic and hypertrophic toenails, 1 through 5 bilateral and clearing of subungual debris. No ulceration, no infection noted. Debride callus.  Return Visit-Office Procedure: Patient instructed to return to the office for a follow up visit 3 months for continued evaluation and treatment.    Helane Gunther DPM

## 2017-04-15 ENCOUNTER — Ambulatory Visit (INDEPENDENT_AMBULATORY_CARE_PROVIDER_SITE_OTHER): Payer: Medicare Other | Admitting: Family

## 2017-04-15 VITALS — BP 138/86 | HR 70 | Temp 98.0°F | Resp 16 | Wt 241.8 lb

## 2017-04-15 DIAGNOSIS — Z23 Encounter for immunization: Secondary | ICD-10-CM

## 2017-04-15 DIAGNOSIS — M25552 Pain in left hip: Secondary | ICD-10-CM

## 2017-04-15 DIAGNOSIS — I10 Essential (primary) hypertension: Secondary | ICD-10-CM

## 2017-04-15 LAB — CBC WITH DIFFERENTIAL/PLATELET
Basophils Absolute: 0 10*3/uL (ref 0.0–0.1)
Basophils Relative: 0.6 % (ref 0.0–3.0)
Eosinophils Absolute: 0.1 10*3/uL (ref 0.0–0.7)
Eosinophils Relative: 1.6 % (ref 0.0–5.0)
HCT: 42 % (ref 39.0–52.0)
Hemoglobin: 14.4 g/dL (ref 13.0–17.0)
Lymphocytes Relative: 25.7 % (ref 12.0–46.0)
Lymphs Abs: 2.1 10*3/uL (ref 0.7–4.0)
MCHC: 34.2 g/dL (ref 30.0–36.0)
MCV: 97.4 fl (ref 78.0–100.0)
Monocytes Absolute: 0.7 10*3/uL (ref 0.1–1.0)
Monocytes Relative: 8.5 % (ref 3.0–12.0)
Neutro Abs: 5.2 10*3/uL (ref 1.4–7.7)
Neutrophils Relative %: 63.6 % (ref 43.0–77.0)
Platelets: 298 10*3/uL (ref 150.0–400.0)
RBC: 4.32 Mil/uL (ref 4.22–5.81)
RDW: 13.9 % (ref 11.5–15.5)
WBC: 8.2 10*3/uL (ref 4.0–10.5)

## 2017-04-15 LAB — LIPID PANEL
Cholesterol: 135 mg/dL (ref 0–200)
HDL: 48.5 mg/dL (ref >39.00)
LDL Cholesterol: 69 mg/dL (ref 0–99)
NonHDL: 86.09
Total CHOL/HDL Ratio: 3
Triglycerides: 86 mg/dL (ref 0.0–149.0)
VLDL: 17.2 mg/dL (ref 0.0–40.0)

## 2017-04-15 LAB — COMPREHENSIVE METABOLIC PANEL
ALT: 36 U/L (ref 0–53)
AST: 29 U/L (ref 0–37)
Albumin: 4.3 g/dL (ref 3.5–5.2)
Alkaline Phosphatase: 55 U/L (ref 39–117)
BUN: 21 mg/dL (ref 6–23)
CO2: 33 mEq/L — ABNORMAL HIGH (ref 19–32)
Calcium: 9.6 mg/dL (ref 8.4–10.5)
Chloride: 103 mEq/L (ref 96–112)
Creatinine, Ser: 1.19 mg/dL (ref 0.40–1.50)
GFR: 62.13 mL/min (ref >60.00)
Glucose, Bld: 103 mg/dL — ABNORMAL HIGH (ref 70–99)
Potassium: 4.4 mEq/L (ref 3.5–5.1)
Sodium: 142 mEq/L (ref 135–145)
Total Bilirubin: 1.6 mg/dL — ABNORMAL HIGH (ref 0.2–1.2)
Total Protein: 6.7 g/dL (ref 6.0–8.3)

## 2017-04-15 LAB — VITAMIN D 25 HYDROXY (VIT D DEFICIENCY, FRACTURES): VITD: 59.67 ng/mL (ref 30.00–100.00)

## 2017-04-15 LAB — HEMOGLOBIN A1C: Hgb A1c MFr Bld: 5.1 % (ref 4.6–6.5)

## 2017-04-15 LAB — PROTIME-INR
INR: 1.2 ratio — ABNORMAL HIGH (ref 0.8–1.0)
Prothrombin Time: 12.5 s (ref 9.6–13.1)

## 2017-04-15 LAB — TSH: TSH: 1.74 u[IU]/mL (ref 0.35–4.50)

## 2017-04-15 NOTE — Patient Instructions (Addendum)
Pleasure seeing you and good luck with surgery  Labs today  We will also touch base with Cordelia Pen to ensure she has what she needs  EKG looks to be the same as 2014- it shows a bundle branch block.

## 2017-04-15 NOTE — Progress Notes (Signed)
Subjective:    Patient ID: Terry Vaughn, male    DOB: 1932/08/21, 83 y.o.   MRN: [id number]  CC: Terry Vaughn is a 83 y.o. male who presents today for follow up.   HPI: Here for surgical clearance from Dr Odis Luster [facility] for left hip replacement.   Requesting EKG, labs.   Feels well today. No complaints.   No sleep apnea per patient. Doesn't snore per patient. Has never been tested for sleep study and declines testing prior to surgery.   Denies exertional chest pain or pressure, numbness or tingling radiating to left arm or jaw, palpitations, dizziness, frequent headaches, changes in vision, or shortness of breath.  Has never seen cardiology.    Using mobic daily, prilosec for hip pain and would like to come off of both after surgery.        Dr Wyn Quaker- Dr Martha Clan Dr Odis Luster for left hip HISTORY:  Past Medical History:  Diagnosis Date  . Bone spur 1988   left heel  . Bursitis 1987   right shoulder  . Cellulitis 1986   right leg  . Shingles 1959   Past Surgical History:  Procedure Laterality Date  . APPENDECTOMY  1942  . CHOLECYSTECTOMY  1966  . EYE SURGERY  2015   catarcts extracted  . MELANOMA EXCISION  04.2010   mole removal right arm  . TONSILLECTOMY AND ADENOIDECTOMY  1944  . TOTAL HIP ARTHROPLASTY  1999   right hip   Family History  Problem Relation Age of Onset  . Clotting disorder Mother   . Heart disease Father 7  . Cancer Sister        breast  . Cerebral aneurysm Brother        [place]  . Cancer Daughter        breast    Allergies: Patient has no known allergies. Current Outpatient Prescriptions on File Prior to Visit  Medication Sig Dispense Refill  . aspirin 81 MG tablet Take 81 mg by mouth daily.    . meloxicam (MOBIC) 15 MG tablet Take 15 mg by mouth daily.    . Multiple Vitamins-Minerals (CENTRUM SILVER ULTRA MENS) TABS Take 1 each by mouth daily.    Marland Kitchen omeprazole (PRILOSEC) 20 MG capsule Take 1 capsule (20 mg total) by mouth  daily. 90 capsule 3  . Omeprazole 20 MG TBEC omeprazole 20 mg capsule,delayed release    . vitamin D, CHOLECALCIFEROL, 400 UNITS tablet Take 400 Units by mouth daily.     No current facility-administered medications on file prior to visit.     Social History  Substance Use Topics  . Smoking status: Never Smoker  . Smokeless tobacco: Never Used  . Alcohol use No    Review of Systems  Constitutional: Negative for chills and fever.  Respiratory: Negative for cough.   Cardiovascular: Negative for chest pain and palpitations.  Gastrointestinal: Negative for nausea and vomiting.      Objective:    BP 138/86   Pulse 70   Temp 98 F (36.7 C) (Oral)   Resp 16   Wt 241 lb 12.8 oz (109.7 kg)   SpO2 97%   BMI 31.05 kg/m  BP Readings from Last 3 Encounters:  04/15/17 138/86  03/10/17 (!) 154/74  11/27/16 (!) 160/65   Wt Readings from Last 3 Encounters:  04/15/17 241 lb 12.8 oz (109.7 kg)  03/10/17 248 lb (112.5 kg)  11/27/16 261 lb (118.4 kg)    Physical Exam  Constitutional: He appears well-developed and well-nourished.  Cardiovascular: Regular rhythm and normal heart sounds.   Pulmonary/Chest: Effort normal and breath sounds normal. No respiratory distress. He has no wheezes. He has no rhonchi. He has no rales.  Neurological: He is alert.  Skin: Skin is warm and dry.  Psychiatric: He has a normal mood and affect. His speech is normal and behavior is normal.  Vitals reviewed.      Assessment & Plan:   Problem List Items Addressed This Visit      Cardiovascular and Mediastinum   Hypertension    Well controlled. Lifestyle measures only, no medications.  EKG showed right bundle-branch block. When a branch block was also seen in 2014. No significant changes noted when compared with EKGs. No ischemia noted today. Patient is not having any cardiac complaints. At this point, I see him as moderate to low risk as far as I can determine for surgery namely based on his age.    Case reviewed with supervising, Dr Duncan Dull, and she and I jointly agreed on management plan.         Other   Left hip pain - Primary    Chronic. Pending surgery. Labs ordered and will review them with patient.       Relevant Orders   CBC with Differential/Platelet (Completed)   Comprehensive metabolic panel (Completed)   Hemoglobin A1c (Completed)   Lipid panel (Completed)   TSH (Completed)   VITAMIN D 25 Hydroxy (Vit-D Deficiency, Fractures) (Completed)   Protime-INR (Completed)   EKG 12-Lead (Completed)    Other Visit Diagnoses    Encounter for immunization       Relevant Orders   Flu vaccine HIGH DOSE PF (Completed)   CBC with Differential/Platelet (Completed)   Comprehensive metabolic panel (Completed)   Hemoglobin A1c (Completed)   Lipid panel (Completed)   TSH (Completed)   VITAMIN D 25 Hydroxy (Vit-D Deficiency, Fractures) (Completed)   Protime-INR (Completed)       I have discontinued Mr. Mizell FLUZONE HIGH-DOSE. I am also having him maintain his CENTRUM SILVER ULTRA MENS, aspirin, vitamin D (CHOLECALCIFEROL), meloxicam, omeprazole, and Omeprazole.   No orders of the defined types were placed in this encounter.   Return precautions given.   Risks, benefits, and alternatives of the medications and treatment plan prescribed today were discussed, and patient expressed understanding.   Education regarding symptom management and diagnosis given to patient on AVS.  Continue to follow with Allegra Grana, FNP for routine health maintenance.   [name] and I agreed with plan.   Rennie Plowman, FNP

## 2017-04-16 ENCOUNTER — Telehealth: Payer: Self-pay | Admitting: Family

## 2017-04-16 NOTE — Assessment & Plan Note (Addendum)
Well controlled. Lifestyle measures only, no medications.  EKG showed right bundle-branch block. When a branch block was also seen in 2014. No significant changes noted when compared with EKGs. No ischemia noted today. Patient is not having any cardiac complaints. At this point, I see him as moderate to low risk as far as I can determine for surgery namely based on his age.   Case reviewed with supervising, Dr Duncan Dull, and she and I jointly agreed on management plan.

## 2017-04-16 NOTE — Assessment & Plan Note (Signed)
Chronic. Pending surgery. Labs ordered and will review them with patient.

## 2017-04-16 NOTE — Telephone Encounter (Signed)
close

## 2017-04-17 ENCOUNTER — Telehealth: Payer: Self-pay | Admitting: *Deleted

## 2017-04-17 NOTE — Telephone Encounter (Signed)
[facility] requested to know if the medical clearance was received for the left total hip. If so please fax over without a cover sheet. Another form will be sent over . Fax [phone]

## 2017-04-17 NOTE — Progress Notes (Signed)
Patients wife stated she will let us know. They are picking up letters today.

## 2017-04-17 NOTE — Telephone Encounter (Signed)
Pt's wife requested a call  [phone]

## 2017-04-17 NOTE — Telephone Encounter (Signed)
See result note.  

## 2017-04-20 NOTE — Telephone Encounter (Signed)
Paperwork in provider box for signature

## 2017-04-21 NOTE — Telephone Encounter (Signed)
Paper work has been faxed

## 2017-05-05 ENCOUNTER — Encounter
Admission: RE | Admit: 2017-05-05 | Discharge: 2017-05-05 | Disposition: A | Discharge status: Home / Self Care | Payer: Medicare Other | Source: Ambulatory Visit | Attending: Internal Medicine | Admitting: Internal Medicine

## 2017-05-06 ENCOUNTER — Other Ambulatory Visit: Payer: Self-pay

## 2017-05-06 MED ORDER — OXYCODONE HCL 5 MG PO TABS: 5.0000 mg | ORAL_TABLET | ORAL | 0 refills | Status: DC | PRN

## 2017-05-06 NOTE — Telephone Encounter (Signed)
Rx sent to [facility] phone : [phone] , fax : [phone]  

## 2017-05-07 DIAGNOSIS — M159 Polyosteoarthritis, unspecified: Secondary | ICD-10-CM | POA: Insufficient documentation

## 2017-05-12 ENCOUNTER — Non-Acute Institutional Stay (SKILLED_NURSING_FACILITY): Payer: Medicare Other | Admitting: Gerontology

## 2017-05-12 DIAGNOSIS — R609 Edema, unspecified: Secondary | ICD-10-CM | POA: Diagnosis not present

## 2017-05-12 DIAGNOSIS — M1612 Unilateral primary osteoarthritis, left hip: Secondary | ICD-10-CM

## 2017-05-13 ENCOUNTER — Encounter: Payer: Self-pay | Admitting: Gerontology

## 2017-05-13 NOTE — Progress Notes (Signed)
Location:   [facility] Room Number: 205A Place of Service:  SNF [phone]) Provider:  Lorenso Quarry, NP-C  [name], Lyn Records, FNP  Patient Care Team: Allegra Grana, FNP as PCP - General (Family Medicine)  Extended Emergency Contact Information Primary Emergency Contact: Edd, Reppert Address: [address]          [place], [place] [address] [place] Home Phone: [phone] Relation: Spouse  Code Status:  FULL Goals of care: Advanced Directive information Advanced Directives 05/12/2017  Does Patient Have a Medical Advance Directive? No  Type of Advance Directive -  Does patient want to make changes to medical advance directive? -  Copy of Healthcare Power of Attorney in Chart? -     Chief Complaint  Patient presents with  . Acute Visit    Follow up on leg swelling    HPI:  Pt is a 83 y.o. male seen today for an acute visit for swelling of the left leg.  Patient was admitted to the facility for rehab following a left total hip replacement.  Patient has been participating in PT and OT.  However, patient began having significant swelling of the left leg from toes to the hip joint and some tightness in the abdomen.  Left calf is somewhat warm and tender to palpation.  Bilateral pedal pulses strong and equal.  Patient expresses discomfort with the edema.  Given patient having recently undergone the left total hip replacement, will order a Doppler to ensure no DVT.  Then if negative, will order teds and adjustments to diuretics.  Patient is agreeable to this plan.  Patient denies cough, congestion, shortness of breath, or chest pain.  Incision is well approximated.  No redness, irritation or drainage.  Vital signs stable.  No other complaints   Past Medical History:  Diagnosis Date  . Bone spur 1988   left heel  . Bursitis 1987   right shoulder  . Cellulitis 1986   right leg  . Shingles 1959   Past Surgical History:  Procedure  Laterality Date  . APPENDECTOMY  1942  . CHOLECYSTECTOMY  1966  . EYE SURGERY  2015   catarcts extracted  . MELANOMA EXCISION  04.2010   mole removal right arm  . TONSILLECTOMY AND ADENOIDECTOMY  1944  . TOTAL HIP ARTHROPLASTY  1999   right hip    No Known Allergies  Allergies as of 05/12/2017   No Known Allergies     Medication List       Accurate as of 05/12/17 11:59 PM. Always use your most recent med list.          acetaminophen 500 MG tablet Commonly known as:  TYLENOL Take 500 mg by mouth every 6 (six) hours as needed. Maximum 3 grams in 24 hours   aspirin 81 MG chewable tablet Chew 81 mg by mouth daily.   bisacodyl 10 MG/30ML Enem Commonly known as:  FLEET Place 10 mg rectally 2 (two) times daily as needed.   CENTRUM SILVER ULTRA MENS Tabs Take 1 each by mouth daily.   diphenhydrAMINE 25 mg capsule Commonly known as:  BENADRYL Take 25 mg by mouth as needed for itching.   docusate sodium 100 MG capsule Commonly known as:  COLACE Take 200 mg by mouth daily.   Ergocalciferol 400 units Tabs Take 1 tablet by mouth daily.   meloxicam 15 MG tablet Commonly known as:  MOBIC Take 15 mg by mouth daily.  omeprazole 20 MG capsule Commonly known as:  PRILOSEC Take 1 capsule (20 mg total) by mouth daily.   sennosides-docusate sodium 8.6-50 MG tablet Commonly known as:  SENOKOT-S Take 2 tablets by mouth 2 (two) times daily.   tamsulosin 0.4 MG Caps capsule Commonly known as:  FLOMAX Take 0.4 mg by mouth at bedtime.   traMADol 50 MG tablet Commonly known as:  ULTRAM Take 50 mg by mouth every 4 (four) hours as needed.       Review of Systems  Constitutional: Negative for activity change, appetite change, chills, diaphoresis and fever.  HENT: Negative for congestion, sneezing, sore throat, trouble swallowing and voice change.   Respiratory: Negative for apnea, cough, choking, chest tightness, shortness of breath and wheezing.   Cardiovascular:  Positive for leg swelling. Negative for chest pain and palpitations.  Gastrointestinal: Negative for abdominal distention, abdominal pain, constipation, diarrhea and nausea.  Genitourinary: Negative for difficulty urinating, dysuria, frequency and urgency.  Musculoskeletal: Positive for arthralgias (typical arthritis) and joint swelling. Negative for back pain, gait problem and myalgias.  Skin: Positive for wound. Negative for color change, pallor and rash.  Neurological: Negative for dizziness, tremors, syncope, speech difficulty, weakness, numbness and headaches.  Psychiatric/Behavioral: Negative for agitation and behavioral problems.  All other systems reviewed and are negative.   Immunization History  Administered Date(s) Administered  . Hepatitis B 07/06/1992  . Influenza Nasal 03/29/2011  . Influenza Split 05/02/2012, 05/11/2014  . Influenza Whole 05/28/2008  . Influenza, High Dose Seasonal PF 04/15/2017  . Influenza-Unspecified 05/02/2015, 05/06/2016  . Pneumococcal Conjugate-13 03/24/2014  . Pneumococcal Polysaccharide-23 05/28/2000  . Td 05/28/2008  . Tdap 12/27/2010  . Zoster 05/29/2007   Pertinent  Health Maintenance Due  Topic Date Due  . INFLUENZA VACCINE  Completed  . PNA vac Low Risk Adult  Completed   Fall Risk  11/27/2016 05/08/2016 11/28/2015 03/24/2014 03/22/2013  Falls in the past year? No No No No No   Functional Status Survey:    Vitals:   05/12/17 1129  BP: (!) 173/64  Pulse: 85  Resp: 20  Temp: 99.1 F (37.3 C)  SpO2: 99%  Weight: 241 lb (109.3 kg)  Height: 6\' 2"  (1.88 m)   Body mass index is 30.94 kg/m. Physical Exam  Constitutional: He is oriented to person, place, and time. Vital signs are normal. He appears well-developed and well-nourished. He is active and cooperative. He does not appear ill. No distress.  HENT:  Head: Normocephalic and atraumatic.  Mouth/Throat: Uvula is midline, oropharynx is clear and moist and mucous membranes are  normal. Mucous membranes are not pale, not dry and not cyanotic.  Eyes: Pupils are equal, round, and reactive to light. Conjunctivae, EOM and lids are normal.  Neck: Trachea normal, normal range of motion and full passive range of motion without pain. Neck supple. No JVD present. No tracheal deviation, no edema and no erythema present. No thyromegaly present.  Cardiovascular: Normal rate, regular rhythm, normal heart sounds, intact distal pulses and normal pulses.  Exam reveals no gallop and no distant heart sounds.   Pulses:      Dorsalis pedis pulses are 2+ on the right side, and 2+ on the left side.  3+ pitting edema left lower extremity  Pulmonary/Chest: Effort normal and breath sounds normal. No accessory muscle usage. No respiratory distress. He has no decreased breath sounds. He has no wheezes. He has no rhonchi. He has no rales. He exhibits no tenderness.  Abdominal: Soft. Normal appearance and  bowel sounds are normal. He exhibits no distension and no ascites. There is no tenderness.  Musculoskeletal: He exhibits no edema or tenderness.       Left hip: He exhibits decreased range of motion, decreased strength, swelling and laceration.  Expected osteoarthritis, stiffness, right calf is soft, supple.  Negative Homans sign.  Left calf tight, edematous  Neurological: He is alert and oriented to person, place, and time. He has normal strength.  Skin: Skin is warm and dry. Laceration noted. He is not diaphoretic. No cyanosis. No pallor. Nails show no clubbing.  Psychiatric: He has a normal mood and affect. His speech is normal and behavior is normal. Judgment and thought content normal. Cognition and memory are normal.  Nursing note and vitals reviewed.   Labs reviewed:  Recent Labs  04/15/17 1432  NA 142  K 4.4  CL 103  CO2 33*  GLUCOSE 103*  BUN 21  CREATININE 1.19  CALCIUM 9.6    Recent Labs  04/15/17 1432  AST 29  ALT 36  ALKPHOS 55  BILITOT 1.6*  PROT 6.7  ALBUMIN 4.3      Recent Labs  04/15/17 1432  WBC 8.2  NEUTROABS 5.2  HGB 14.4  HCT 42.0  MCV 97.4  PLT 298.0   Lab Results  Component Value Date   TSH 1.74 04/15/2017   Lab Results  Component Value Date   HGBA1C 5.1 04/15/2017   Lab Results  Component Value Date   CHOL 135 04/15/2017   HDL 48.50 04/15/2017   LDLCALC 69 04/15/2017   TRIG 86.0 04/15/2017   CHOLHDL 3 04/15/2017    Significant Diagnostic Results in last 30 days:  No results found.  Assessment/Plan 1.  Primary osteoarthritis of left hip  Continue working with PT/OT  Continue exercises as taught by PT/OT  Ice pack to the hip as needed for pain and/or edema  Continue Tylenol 500 mg p.o. every 6 hours as needed mild pain  Continue tramadol 50 mg 1 tablet p.o. every 4 hours as needed pain   2.  Peripheral edema  Lasix 40 mg IM x1 now  Give potassium chloride 10 M EQ p.o. x1 now  Thigh-high TED hose-on in the early a.m., off in the p.m.  Torsemide 5 mg p.o. daily  Family/ staff Communication:   Total Time:  Documentation:  Face to Face:  Family/Phone:   Labs/tests ordered:    Medication list reviewed and assessed for continued appropriateness.  Brynda RimShannon H. Mekel Haverstock, NP-C Geriatrics [facility] [phone] [address] [place] [place], [place] [address] Cell Phone (Mon-Fri 8am-5pm):  [phone] On Call:  [phone] & follow prompts after 5pm & weekends Office Phone:  [phone] Office Fax:  [phone]

## 2017-05-19 ENCOUNTER — Non-Acute Institutional Stay (SKILLED_NURSING_FACILITY): Payer: Medicare Other | Admitting: Gerontology

## 2017-05-19 ENCOUNTER — Other Ambulatory Visit
Admission: RE | Admit: 2017-05-19 | Discharge: 2017-05-19 | Disposition: A | Discharge status: Home / Self Care | Payer: Medicare Other | Source: Skilled Nursing Facility | Attending: Internal Medicine | Admitting: Internal Medicine

## 2017-05-19 DIAGNOSIS — R339 Retention of urine, unspecified: Secondary | ICD-10-CM | POA: Diagnosis not present

## 2017-05-19 DIAGNOSIS — R300 Dysuria: Secondary | ICD-10-CM | POA: Insufficient documentation

## 2017-05-19 DIAGNOSIS — M1612 Unilateral primary osteoarthritis, left hip: Secondary | ICD-10-CM

## 2017-05-19 DIAGNOSIS — R350 Frequency of micturition: Secondary | ICD-10-CM | POA: Diagnosis present

## 2017-05-19 DIAGNOSIS — R609 Edema, unspecified: Secondary | ICD-10-CM | POA: Diagnosis not present

## 2017-05-19 LAB — URINALYSIS, COMPLETE (UACMP) WITH MICROSCOPIC
Bilirubin Urine: NEGATIVE
Glucose, UA: NEGATIVE mg/dL
Ketones, ur: NEGATIVE mg/dL
Nitrite: NEGATIVE
Protein, ur: NEGATIVE mg/dL
Specific Gravity, Urine: 1.01 (ref 1.005–1.030)
Squamous Epithelial / LPF: NONE SEEN
pH: 5 (ref 5.0–8.0)

## 2017-05-20 ENCOUNTER — Inpatient Hospital Stay: Payer: Medicare Other

## 2017-05-20 ENCOUNTER — Inpatient Hospital Stay
Admission: EM | Admit: 2017-05-20 | Discharge: 2017-05-22 | DRG: 872 | Disposition: A | Discharge status: Skilled Nursing Facility | Payer: Medicare Other | Attending: Specialist | Admitting: Specialist

## 2017-05-20 ENCOUNTER — Encounter: Payer: Self-pay | Admitting: Emergency Medicine

## 2017-05-20 ENCOUNTER — Emergency Department: Payer: Medicare Other

## 2017-05-20 ENCOUNTER — Encounter: Payer: Self-pay | Admitting: Gerontology

## 2017-05-20 DIAGNOSIS — Z9049 Acquired absence of other specified parts of digestive tract: Secondary | ICD-10-CM

## 2017-05-20 DIAGNOSIS — B9620 Unspecified Escherichia coli [E. coli] as the cause of diseases classified elsewhere: Secondary | ICD-10-CM | POA: Diagnosis present

## 2017-05-20 DIAGNOSIS — I872 Venous insufficiency (chronic) (peripheral): Secondary | ICD-10-CM | POA: Diagnosis present

## 2017-05-20 DIAGNOSIS — N19 Unspecified kidney failure: Secondary | ICD-10-CM | POA: Diagnosis present

## 2017-05-20 DIAGNOSIS — N184 Chronic kidney disease, stage 4 (severe): Secondary | ICD-10-CM | POA: Diagnosis present

## 2017-05-20 DIAGNOSIS — E875 Hyperkalemia: Secondary | ICD-10-CM | POA: Diagnosis present

## 2017-05-20 DIAGNOSIS — N5089 Other specified disorders of the male genital organs: Secondary | ICD-10-CM | POA: Diagnosis present

## 2017-05-20 DIAGNOSIS — N289 Disorder of kidney and ureter, unspecified: Secondary | ICD-10-CM | POA: Diagnosis not present

## 2017-05-20 DIAGNOSIS — N431 Infected hydrocele: Secondary | ICD-10-CM | POA: Diagnosis present

## 2017-05-20 DIAGNOSIS — N401 Enlarged prostate with lower urinary tract symptoms: Secondary | ICD-10-CM | POA: Diagnosis present

## 2017-05-20 DIAGNOSIS — Z7982 Long term (current) use of aspirin: Secondary | ICD-10-CM

## 2017-05-20 DIAGNOSIS — N179 Acute kidney failure, unspecified: Secondary | ICD-10-CM | POA: Diagnosis present

## 2017-05-20 DIAGNOSIS — Z85820 Personal history of malignant melanoma of skin: Secondary | ICD-10-CM | POA: Diagnosis not present

## 2017-05-20 DIAGNOSIS — N492 Inflammatory disorders of scrotum: Secondary | ICD-10-CM | POA: Diagnosis present

## 2017-05-20 DIAGNOSIS — Z8249 Family history of ischemic heart disease and other diseases of the circulatory system: Secondary | ICD-10-CM

## 2017-05-20 DIAGNOSIS — L539 Erythematous condition, unspecified: Secondary | ICD-10-CM

## 2017-05-20 DIAGNOSIS — R609 Edema, unspecified: Secondary | ICD-10-CM

## 2017-05-20 DIAGNOSIS — A419 Sepsis, unspecified organism: Secondary | ICD-10-CM | POA: Diagnosis present

## 2017-05-20 DIAGNOSIS — N12 Tubulo-interstitial nephritis, not specified as acute or chronic: Secondary | ICD-10-CM | POA: Diagnosis present

## 2017-05-20 DIAGNOSIS — E871 Hypo-osmolality and hyponatremia: Secondary | ICD-10-CM | POA: Diagnosis present

## 2017-05-20 DIAGNOSIS — Z96642 Presence of left artificial hip joint: Secondary | ICD-10-CM | POA: Diagnosis present

## 2017-05-20 DIAGNOSIS — R338 Other retention of urine: Secondary | ICD-10-CM | POA: Diagnosis present

## 2017-05-20 DIAGNOSIS — Z791 Long term (current) use of non-steroidal anti-inflammatories (NSAID): Secondary | ICD-10-CM | POA: Diagnosis not present

## 2017-05-20 DIAGNOSIS — R339 Retention of urine, unspecified: Secondary | ICD-10-CM

## 2017-05-20 DIAGNOSIS — N4889 Other specified disorders of penis: Secondary | ICD-10-CM | POA: Diagnosis present

## 2017-05-20 LAB — CBC WITH DIFFERENTIAL/PLATELET
Basophils Absolute: 0 10*3/uL (ref 0–0.1)
Basophils Relative: 0 %
Eosinophils Absolute: 0 10*3/uL (ref 0–0.7)
Eosinophils Relative: 0 %
HCT: 31.2 % — ABNORMAL LOW (ref 40.0–52.0)
Hemoglobin: 10.6 g/dL — ABNORMAL LOW (ref 13.0–18.0)
Lymphocytes Relative: 3 %
Lymphs Abs: 0.6 10*3/uL — ABNORMAL LOW (ref 1.0–3.6)
MCH: 31.5 pg (ref 26.0–34.0)
MCHC: 33.9 g/dL (ref 32.0–36.0)
MCV: 93 fL (ref 80.0–100.0)
Monocytes Absolute: 0.5 10*3/uL (ref 0.2–1.0)
Monocytes Relative: 2 %
Neutro Abs: 22.8 10*3/uL — ABNORMAL HIGH (ref 1.4–6.5)
Neutrophils Relative %: 95 %
Platelets: 653 10*3/uL — ABNORMAL HIGH (ref 150–440)
RBC: 3.36 MIL/uL — ABNORMAL LOW (ref 4.40–5.90)
RDW: 14.9 % — ABNORMAL HIGH (ref 11.5–14.5)
WBC: 23.9 10*3/uL — ABNORMAL HIGH (ref 3.8–10.6)

## 2017-05-20 LAB — URINALYSIS, COMPLETE (UACMP) WITH MICROSCOPIC
Bilirubin Urine: NEGATIVE
Glucose, UA: NEGATIVE mg/dL
Ketones, ur: NEGATIVE mg/dL
Nitrite: NEGATIVE
Protein, ur: NEGATIVE mg/dL
Specific Gravity, Urine: 1.008 (ref 1.005–1.030)
Squamous Epithelial / LPF: NONE SEEN
pH: 5 (ref 5.0–8.0)

## 2017-05-20 LAB — BASIC METABOLIC PANEL
Anion gap: 13 (ref 5–15)
BUN: 52 mg/dL — ABNORMAL HIGH (ref 6–20)
CO2: 22 mmol/L (ref 22–32)
Calcium: 8 mg/dL — ABNORMAL LOW (ref 8.9–10.3)
Chloride: 93 mmol/L — ABNORMAL LOW (ref 101–111)
Creatinine, Ser: 3.53 mg/dL — ABNORMAL HIGH (ref 0.61–1.24)
GFR calc Af Amer: 17 mL/min — ABNORMAL LOW (ref >60)
GFR calc non Af Amer: 15 mL/min — ABNORMAL LOW (ref >60)
Glucose, Bld: 105 mg/dL — ABNORMAL HIGH (ref 65–99)
Potassium: 5.2 mmol/L — ABNORMAL HIGH (ref 3.5–5.1)
Sodium: 128 mmol/L — ABNORMAL LOW (ref 135–145)

## 2017-05-20 LAB — LACTIC ACID, PLASMA: Lactic Acid, Venous: 2.4 mmol/L (ref 0.5–1.9)

## 2017-05-20 MED ORDER — DIPHENHYDRAMINE HCL 25 MG PO CAPS: 25.0000 mg | ORAL_CAPSULE | ORAL | Status: DC | PRN

## 2017-05-20 MED ORDER — DEXTROSE 5 % IV SOLN
1.0000 g | INTRAVENOUS | Status: DC
  Administered 2017-05-21: 1 g via INTRAVENOUS
  Filled 2017-05-20 (×2): qty 10

## 2017-05-20 MED ORDER — TEMAZEPAM 7.5 MG PO CAPS: 7.5000 mg | ORAL_CAPSULE | Freq: Every evening | ORAL | Status: DC | PRN

## 2017-05-20 MED ORDER — SENNOSIDES-DOCUSATE SODIUM 8.6-50 MG PO TABS
2.0000 | ORAL_TABLET | Freq: Two times a day (BID) | ORAL | Status: DC
  Administered 2017-05-20 – 2017-05-22 (×3): 2 via ORAL
  Filled 2017-05-20 (×4): qty 2

## 2017-05-20 MED ORDER — DEXTROSE 50 % IV SOLN
1.0000 | Freq: Once | INTRAVENOUS | Status: AC
  Administered 2017-05-20: 50 mL via INTRAVENOUS
  Filled 2017-05-20: qty 50

## 2017-05-20 MED ORDER — FENTANYL CITRATE (PF) 100 MCG/2ML IJ SOLN: 50.0000 ug | Freq: Once | INTRAMUSCULAR | Status: DC

## 2017-05-20 MED ORDER — ALUM & MAG HYDROXIDE-SIMETH 400-400-40 MG/5ML PO SUSP
30.0000 mL | ORAL | Status: DC | PRN
  Filled 2017-05-20: qty 30

## 2017-05-20 MED ORDER — TAMSULOSIN HCL 0.4 MG PO CAPS
0.4000 mg | ORAL_CAPSULE | Freq: Every day | ORAL | Status: DC
Start: 2017-05-20 — End: 2017-05-22
  Administered 2017-05-20 – 2017-05-21 (×2): 0.4 mg via ORAL
  Filled 2017-05-20 (×2): qty 1

## 2017-05-20 MED ORDER — HYDROCODONE-ACETAMINOPHEN 5-325 MG PO TABS: 1.0000 | ORAL_TABLET | ORAL | Status: DC | PRN

## 2017-05-20 MED ORDER — CHOLECALCIFEROL 10 MCG (400 UNIT) PO TABS
400.0000 [IU] | ORAL_TABLET | Freq: Every day | ORAL | Status: DC
  Administered 2017-05-21 – 2017-05-22 (×2): 400 [IU] via ORAL
  Filled 2017-05-20 (×2): qty 1

## 2017-05-20 MED ORDER — SODIUM CHLORIDE 0.9 % IV BOLUS (SEPSIS)
1000.0000 mL | Freq: Once | INTRAVENOUS | Status: AC
  Administered 2017-05-20: 1000 mL via INTRAVENOUS

## 2017-05-20 MED ORDER — ASPIRIN 81 MG PO CHEW
81.0000 mg | CHEWABLE_TABLET | Freq: Every day | ORAL | Status: DC
  Administered 2017-05-21 – 2017-05-22 (×2): 81 mg via ORAL
  Filled 2017-05-20 (×2): qty 1

## 2017-05-20 MED ORDER — INSULIN ASPART 100 UNIT/ML ~~LOC~~ SOLN
10.0000 [IU] | Freq: Once | SUBCUTANEOUS | Status: AC
  Administered 2017-05-20: 10 [IU] via INTRAVENOUS
  Filled 2017-05-20: qty 1

## 2017-05-20 MED ORDER — ACETAMINOPHEN 500 MG PO TABS
1000.0000 mg | ORAL_TABLET | Freq: Once | ORAL | Status: AC
  Administered 2017-05-20: 1000 mg via ORAL
  Filled 2017-05-20: qty 2

## 2017-05-20 MED ORDER — LIDOCAINE HCL 2 % EX GEL
CUTANEOUS | Status: AC
  Filled 2017-05-20: qty 10

## 2017-05-20 MED ORDER — ACETAMINOPHEN 500 MG PO TABS
500.0000 mg | ORAL_TABLET | Freq: Four times a day (QID) | ORAL | Status: DC | PRN
  Administered 2017-05-21: 500 mg via ORAL
  Filled 2017-05-20 (×2): qty 1

## 2017-05-20 MED ORDER — SODIUM CHLORIDE 0.9 % IV SOLN: Freq: Once | INTRAVENOUS | Status: DC

## 2017-05-20 MED ORDER — BISACODYL 10 MG RE SUPP: 10.0000 mg | Freq: Two times a day (BID) | RECTAL | Status: DC | PRN

## 2017-05-20 MED ORDER — ONDANSETRON HCL 4 MG/2ML IJ SOLN: 4.0000 mg | Freq: Four times a day (QID) | INTRAMUSCULAR | Status: DC | PRN

## 2017-05-20 MED ORDER — SODIUM BICARBONATE 8.4 % IV SOLN
50.0000 meq | Freq: Once | INTRAVENOUS | Status: AC
  Administered 2017-05-20: 50 meq via INTRAVENOUS
  Filled 2017-05-20: qty 50

## 2017-05-20 MED ORDER — PANTOPRAZOLE SODIUM 40 MG PO TBEC
40.0000 mg | DELAYED_RELEASE_TABLET | Freq: Every day | ORAL | Status: DC
  Administered 2017-05-21 – 2017-05-22 (×2): 40 mg via ORAL
  Filled 2017-05-20 (×2): qty 1

## 2017-05-20 MED ORDER — SODIUM CHLORIDE 0.9 % IV SOLN
INTRAVENOUS | Status: DC
  Administered 2017-05-20 – 2017-05-21 (×4): via INTRAVENOUS

## 2017-05-20 MED ORDER — ERGOCALCIFEROL 8000 UNIT/ML PO SOLN: 400.0000 [IU] | Freq: Every day | ORAL | Status: DC

## 2017-05-20 MED ORDER — PHENAZOPYRIDINE HCL 100 MG PO TABS: 100.0000 mg | ORAL_TABLET | Freq: Three times a day (TID) | ORAL | Status: DC

## 2017-05-20 MED ORDER — CEFTRIAXONE SODIUM IN DEXTROSE 20 MG/ML IV SOLN
1.0000 g | Freq: Once | INTRAVENOUS | Status: AC
  Administered 2017-05-20: 1 g via INTRAVENOUS
  Filled 2017-05-20: qty 50

## 2017-05-20 MED ORDER — ONDANSETRON HCL 4 MG PO TABS: 4.0000 mg | ORAL_TABLET | Freq: Four times a day (QID) | ORAL | Status: DC | PRN

## 2017-05-20 MED ORDER — HEPARIN SODIUM (PORCINE) 5000 UNIT/ML IJ SOLN
5000.0000 [IU] | Freq: Three times a day (TID) | INTRAMUSCULAR | Status: DC
  Administered 2017-05-21 – 2017-05-22 (×2): 5000 [IU] via SUBCUTANEOUS
  Filled 2017-05-20 (×3): qty 1

## 2017-05-20 MED ORDER — CEFTRIAXONE SODIUM IN DEXTROSE 20 MG/ML IV SOLN: 1.0000 g | INTRAVENOUS | Status: DC

## 2017-05-20 MED ORDER — ADULT MULTIVITAMIN W/MINERALS CH
1.0000 | ORAL_TABLET | Freq: Every day | ORAL | Status: DC
  Administered 2017-05-21 – 2017-05-22 (×2): 1 via ORAL
  Filled 2017-05-20 (×2): qty 1

## 2017-05-20 MED ORDER — ERGOCALCIFEROL 10 MCG (400 UNIT) PO TABS: 1.0000 | ORAL_TABLET | Freq: Every day | ORAL | Status: DC

## 2017-05-20 MED ORDER — DOCUSATE SODIUM 100 MG PO CAPS
200.0000 mg | ORAL_CAPSULE | Freq: Every day | ORAL | Status: DC
  Administered 2017-05-22: 200 mg via ORAL
  Filled 2017-05-20 (×2): qty 2

## 2017-05-20 NOTE — H&P (Signed)
[facility]   PATIENT NAME: Terry Vaughn    MR#:  [id number]  DATE OF BIRTH:  Jun 07, 1933  DATE OF ADMISSION:  05/20/2017  PRIMARY CARE PHYSICIAN: Allegra Grana, FNP   REQUESTING/REFERRING PHYSICIAN:   CHIEF COMPLAINT:   Chief Complaint  Patient presents with  . Urinary Retention    HISTORY OF PRESENT ILLNESS: Emillio Ngo  is a 83 y.o. male with below past medical history which includes venous insufficiency, recent left hip replacement in September 2018 presents to the emergency room with acute inability to urinate while at the rehab facility, patient also complained of scrotal edema, while at the rehab facility patient had bladder scan noted to have 900 cc of urine, in the emergency room urinary catheter was placed with Quite a bit of difficulty in discussion with ER attending, Ellery pus elicited after Foley was placed-greater than 1 L of urine released, ED attending did discuss case with urology/Dr. Apolinar Junes who will see patient in the morning, scrotal ultrasound done-noted for possible infected left hydrocele versus hemorrhage/scrotal edema consistent with cellulitis, ER workup noted for white count of 23,000 with left shift, creatinine 3.5 with baseline 1.1, potassium 5.2, sodium 128, urinalysis consistent with urinary tract infection, urinalysis from yesterday noted for gram-negative rods greater than 100,000 colony-forming units, hospital service subsequently contacted for further evaluation/care.  Patient evaluated bedside in the emergency room, wife is present at the bedside, patient is now being admitted for acute probable sepsis secondary to acute urinary tract infection/probable infected hydrocele with associated cellulitis, acute kidney injury.  PAST MEDICAL HISTORY:   Past Medical History:  Diagnosis Date  . Bone spur 1988   left heel  . Bursitis 1987   right shoulder  . Cellulitis 1986   right leg  . Shingles 1959    PAST  SURGICAL HISTORY: Past Surgical History:  Procedure Laterality Date  . APPENDECTOMY  1942  . CHOLECYSTECTOMY  1966  . EYE SURGERY  2015   catarcts extracted  . MELANOMA EXCISION  04.2010   mole removal right arm  . TONSILLECTOMY AND ADENOIDECTOMY  1944  . TOTAL HIP ARTHROPLASTY  1999   right hip    SOCIAL HISTORY:  Social History  Substance Use Topics  . Smoking status: Never Smoker  . Smokeless tobacco: Never Used  . Alcohol use No    FAMILY HISTORY:  Family History  Problem Relation Age of Onset  . Clotting disorder Mother   . Heart disease Father 56  . Cancer Sister        breast  . Cerebral aneurysm Brother        hemmorage  . Cancer Daughter        breast    DRUG ALLERGIES: No Known Allergies  REVIEW OF SYSTEMS:   CONSTITUTIONAL: Subjective fever, +fatigue/generalized weakness.  EYES: No blurred or double vision.  EARS, NOSE, AND THROAT: No tinnitus or ear pain.  RESPIRATORY: No cough, shortness of breath, wheezing or hemoptysis.  CARDIOVASCULAR: No chest pain, orthopnea, chronic lower extremity edema.  GASTROINTESTINAL: No nausea, vomiting, diarrhea or abdominal pain.  GENITOURINARY: Inability to void, scrotal edema, urinary tract infection.  ENDOCRINE: No polyuria, nocturia,  HEMATOLOGY: No anemia, easy bruising or bleeding SKIN: Scrotal swelling  MUSCULOSKELETAL: No joint pain or arthritis.   NEUROLOGIC: No tingling, numbness, weakness.  PSYCHIATRY: No anxiety or depression.   MEDICATIONS AT HOME:  Prior to Admission medications   Medication Sig Start Date End Date Taking? Authorizing Provider  acetaminophen (TYLENOL) 500 MG tablet Take 500 mg by mouth every 6 (six) hours as needed. Maximum 3 grams in 24 hours    [provider]  alum & mag hydroxide-simeth (MAALOX PLUS) 400-400-40 MG/5ML suspension Take 30 mLs by mouth every 4 (four) hours as needed for indigestion.    [provider]  aspirin 81 MG chewable tablet Chew 81 mg by  mouth daily.    [provider]  bisacodyl (FLEET) 10 MG/30ML ENEM Place 10 mg rectally 2 (two) times daily as needed.    [provider]  diphenhydrAMINE (BENADRYL) 25 mg capsule Take 25 mg by mouth as needed for itching.    [provider]  docusate sodium (COLACE) 100 MG capsule Take 200 mg by mouth daily.    [provider]  Ergocalciferol 400 units TABS Take 1 tablet by mouth daily.    [provider]  meloxicam (MOBIC) 15 MG tablet Take 15 mg by mouth daily.     [provider]  Multiple Vitamins-Minerals (CENTRUM SILVER ULTRA MENS) TABS Take 1 each by mouth daily.    [provider]  omeprazole (PRILOSEC) 20 MG capsule Take 1 capsule (20 mg total) by mouth daily. 04/25/16   Allegra Grana, FNP  phenazopyridine (PYRIDIUM) 100 MG tablet Take 100 mg by mouth 3 (three) times daily. 05/19/17 05/21/17  [provider]  sennosides-docusate sodium (SENOKOT-S) 8.6-50 MG tablet Take 2 tablets by mouth 2 (two) times daily.    [provider]  tamsulosin (FLOMAX) 0.4 MG CAPS capsule Take 0.4 mg by mouth at bedtime.    [provider]  traMADol (ULTRAM) 50 MG tablet Take 50 mg by mouth every 4 (four) hours as needed.    [provider]      PHYSICAL EXAMINATION:   VITAL SIGNS: Blood pressure (!) 119/53, pulse 90, temperature 98.1 F (36.7 C), temperature source Oral, resp. rate 20, height 6\' 1"  (1.854 m), weight 118.4 kg (261 lb), SpO2 96 %.  GENERAL:  83 y.o.-year-old patient lying in the bed with mild acute distress/anxiousness, obese.  EYES: Pupils equal, round, reactive to light and accommodation. No scleral icterus. Extraocular muscles intact.  HEENT: Head atraumatic, normocephalic. Oropharynx and nasopharynx clear.  NECK:  Supple, no jugular venous distention. No thyroid enlargement, no tenderness.  LUNGS: Normal breath sounds bilaterally, no wheezing, rales,rhonchi or crepitation. No use of  accessory muscles of respiration.  CARDIOVASCULAR: S1, S2 normal. No murmurs, rubs, or gallops.  ABDOMEN: Soft, nontender, nondistended. Bowel sounds present. No organomegaly or mass.  EXTREMITIES: No pedal edema, cyanosis, or clubbing.  NEUROLOGIC: Cranial nerves II through XII are intact. Muscle strength 5/5 in all extremities. Sensation intact. Gait not checked.  PSYCHIATRIC: The patient is alert and oriented x 3.  SKIN: Left hip scar/wound from recent hip surgery, scrotal edema with erythema, Foley in place   LABORATORY PANEL:   CBC  Recent Labs Lab 05/20/17 1825  WBC 23.9*  HGB 10.6*  HCT 31.2*  PLT 653*  MCV 93.0  MCH 31.5  MCHC 33.9  RDW 14.9*  LYMPHSABS 0.6*  MONOABS 0.5  EOSABS 0.0  BASOSABS 0.0   ------------------------------------------------------------------------------------------------------------------  Chemistries   Recent Labs Lab 05/20/17 1825  NA 128*  K 5.2*  CL 93*  CO2 22  GLUCOSE 105*  BUN 52*  CREATININE 3.53*  CALCIUM 8.0*   ------------------------------------------------------------------------------------------------------------------ estimated creatinine clearance is 21.7 mL/min (A) (by C-G formula based on SCr of 3.53 mg/dL (H)). ------------------------------------------------------------------------------------------------------------------ No results for  input(s): TSH, T4TOTAL, T3FREE, THYROIDAB in the last 72 hours.  Invalid input(s): FREET3   Coagulation profile No results for input(s): INR, PROTIME in the last 168 hours. ------------------------------------------------------------------------------------------------------------------- No results for input(s): DDIMER in the last 72 hours. -------------------------------------------------------------------------------------------------------------------  Cardiac Enzymes No results for input(s): CKMB, TROPONINI, MYOGLOBIN in the last 168 hours.  Invalid input(s):  CK ------------------------------------------------------------------------------------------------------------------ Invalid input(s): POCBNP  ---------------------------------------------------------------------------------------------------------------  Urinalysis    Component Value Date/Time   COLORURINE YELLOW (A) 05/20/2017 1825   APPEARANCEUR CLEAR (A) 05/20/2017 1825   LABSPEC 1.008 05/20/2017 1825   PHURINE 5.0 05/20/2017 1825   GLUCOSEU NEGATIVE 05/20/2017 1825   HGBUR MODERATE (A) 05/20/2017 1825   BILIRUBINUR NEGATIVE 05/20/2017 1825   KETONESUR NEGATIVE 05/20/2017 1825   PROTEINUR NEGATIVE 05/20/2017 1825   NITRITE NEGATIVE 05/20/2017 1825   LEUKOCYTESUR TRACE (A) 05/20/2017 1825     RADIOLOGY: [place] Scrotom W/doppler  Result Date: 05/20/2017 CLINICAL DATA:  Scrotal pain, swelling and erythema. EXAM: SCROTAL ULTRASOUND DOPPLER ULTRASOUND OF THE TESTICLES TECHNIQUE: Complete ultrasound examination of the testicles, epididymis, and other scrotal structures was performed. Color and spectral Doppler ultrasound were also utilized to evaluate blood flow to the testicles. COMPARISON:  None. FINDINGS: Right testicle Measurements: 4.5 x 2.5 x 2.3 cm. No mass or microlithiasis visualized. Left testicle Measurements: 4.2 x 2.6 x 2.5 cm. No mass or microlithiasis visualized. Right epididymis:  Normal in size and appearance. Left epididymis:  Normal in size and appearance. Hydrocele: Oval cystic and solid appearing area along the lateral aspect of the left testicle measuring 3.4 x 1.6 x 1.5 cm. No internal blood flow seen within this area with color Doppler. Varicocele:  None visualized. Other: Diffuse scrotal skin thickening. Pulsed Doppler interrogation of both testes demonstrates normal low resistance arterial and venous waveforms bilaterally. IMPRESSION: 1. 3.4 x 1.6 x 1.5 cm probable complicated hydrocele lateral to the left testicle. This could represent a hydrocele complicated by  infection or hemorrhage. 2. Diffuse scrotal skin thickening compatible with diffuse scrotal cellulitis. 3. Normal appearing testicles and epididymides. Electronically Signed   By: Beckie Salts M.D.   On: 05/20/2017 19:10    EKG: Orders placed or performed in visit on 04/15/17  . EKG 12-Lead    IMPRESSION AND PLAN: 1 acute probable sepsis Secondary to acute urinary tract infection, probable left infected hydrocele with associated cellulitis Presenting with subjective fevers, chills, acute renal failure, leukocytosis with left shift, gram-negative rod urinary tract infection Admit to MedSurg floor on our sepsis protocol, empiric Rocephin for 5-7-day course, follow-up on cultures from May 19, 2017-noted for gram-negative rods, Foley to gravity, strict I&O monitoring, IV fluids for rehydration, continue close medical monitoring  2 acute urinary retention Most likely secondary to acute gram-negative rod UTI and left infected hydrocele with associated cellulitis Urology/Dr. Apolinar Junes to see in the morning, continue Foley to gravity, Flomax daily, strict monitoring, ice pack to scrotal area with elevation, and antibiotics per above Scrotal ultrasound noted  3 acute gram-negative rod UTI Stable Plan of care as stated above  4 acute kidney injury Most likely secondary to postobstructive uropathy/acute urinary retention Continue Foley, IV fluids for rehydration, avoid nephrotoxic agents, BMP daily, and consider nephrology consultation if any further worsening   5 acute probable left infected hydrocele with associated cellulitis Plan of care as stated above  6 chronic venous insufficiency, lower extremities TED hose daily  DNI Condition stable Prognosis poor DVT prophylaxis with heparin subcu Disposition back to rehab in 2-3 days pending clinical course and  clearance by urology  All the records are reviewed and case discussed with ED provider. Management plans discussed with the  patient, family and they are in agreement.  CODE STATUS: Code Status History    This patient does not have a recorded code status. Please follow your organizational policy for patients in this situation.       TOTAL TIME TAKING CARE OF THIS PATIENT: 45 minutes.    Evelena AsaMontell D Salary M.D on 05/20/2017   Between 7am to 6pm - Pager - [phone]  After 6pm go to [email or web address] - password [email or web address]  [facility]  Office  [phone]  CC: Primary care physician; Allegra GranaArnett, [name], FNP   Note: This dictation was prepared with Dragon dictation along with smaller phrase technology. Any transcriptional errors that result from this process are unintentional.

## 2017-05-20 NOTE — ED Notes (Signed)
Bladder scan yielded >999 ml

## 2017-05-20 NOTE — ED Notes (Signed)
Patient denies nausea, vomiting and diarrhea.

## 2017-05-20 NOTE — Progress Notes (Signed)
Location:   [facility] Room Number: 205A Place of Service:  SNF [phone]) Provider:  Lorenso Quarry, NP-C  [name], Lyn Records, FNP  Patient Care Team: Allegra Grana, FNP as PCP - General (Family Medicine)  Extended Emergency Contact Information Primary Emergency Contact: Caillou, Minus Address: [address]          [place], [place] [address] [place] Home Phone: [phone] Mobile Phone: [phone] Relation: Spouse Secondary Emergency Contact: Domenick Bookbinder [place] of [place] Home Phone: [phone] Relation: Son  Code Status:  FULL Goals of care: Advanced Directive information Advanced Directives 05/19/2017  Does Patient Have a Medical Advance Directive? No  Type of Advance Directive -  Does patient want to make changes to medical advance directive? -  Copy of Healthcare Power of Attorney in Chart? -     Chief Complaint  Patient presents with  . Medical Management of Chronic Issues    Routine Visit    HPI:  Pt is a 83 y.o. male seen today for medical management of chronic diseases.  Patient was admitted to the facility for a following a left total hip replacement.  Patient has been participating in PT and OT.  However patient began having significant swelling of the left leg from toes to hip last week.  Patient was given Lasix injection, with improvement of symptoms.  Doppler was negative for DVT.  Patient reports pain is well controlled with pain regimen.  Patient reports appetite is fair.  Having regular BMs.  Today, however patient is having urinary retention.  Staff is having a difficult time passing the Foley for an in and out catheterization.  Patient does have some penile edema.  Patient is uncircumcised and the foreskin is crusted together.  Staff had to clean the area very well and gently separate the skin to be able to expose the meatus.  With some difficulty, staff was able to pass the catheter after this and  empty the bladder.  Instructed staff to monitor for further urinary retention.  Send urine for evaluation.  Otherwise patient is stable.  Vital signs stable.  No other complaints   Past Medical History:  Diagnosis Date  . Bone spur 1988   left heel  . Bursitis 1987   right shoulder  . Cellulitis 1986   right leg  . Shingles 1959   Past Surgical History:  Procedure Laterality Date  . APPENDECTOMY  1942  . CHOLECYSTECTOMY  1966  . EYE SURGERY  2015   catarcts extracted  . MELANOMA EXCISION  04.2010   mole removal right arm  . TONSILLECTOMY AND ADENOIDECTOMY  1944  . TOTAL HIP ARTHROPLASTY  1999   right hip    No Known Allergies  Allergies as of 05/19/2017   No Known Allergies     Medication List       Accurate as of 05/19/17 11:59 PM. Always use your most recent med list.          acetaminophen 500 MG tablet Commonly known as:  TYLENOL Take 500 mg by mouth every 6 (six) hours as needed. Maximum 3 grams in 24 hours   alum & mag hydroxide-simeth 400-400-40 MG/5ML suspension Commonly known as:  MAALOX PLUS Take 30 mLs by mouth every 4 (four) hours as needed for indigestion.   aspirin 81 MG chewable tablet Chew 81 mg by mouth daily.   bisacodyl 10 MG/30ML Enem Commonly known as:  FLEET Place 10 mg rectally 2 (two)  times daily as needed.   CENTRUM SILVER ULTRA MENS Tabs Take 1 each by mouth daily.   diphenhydrAMINE 25 mg capsule Commonly known as:  BENADRYL Take 25 mg by mouth as needed for itching.   docusate sodium 100 MG capsule Commonly known as:  COLACE Take 200 mg by mouth daily.   Ergocalciferol 400 units Tabs Take 1 tablet by mouth daily.   meloxicam 15 MG tablet Commonly known as:  MOBIC Take 15 mg by mouth daily.   omeprazole 20 MG capsule Commonly known as:  PRILOSEC Take 1 capsule (20 mg total) by mouth daily.   phenazopyridine 100 MG tablet Commonly known as:  PYRIDIUM Take 100 mg by mouth 3 (three) times daily.     sennosides-docusate sodium 8.6-50 MG tablet Commonly known as:  SENOKOT-S Take 2 tablets by mouth 2 (two) times daily.   tamsulosin 0.4 MG Caps capsule Commonly known as:  FLOMAX Take 0.4 mg by mouth at bedtime.   traMADol 50 MG tablet Commonly known as:  ULTRAM Take 50 mg by mouth every 4 (four) hours as needed.       Review of Systems  Constitutional: Negative for activity change, appetite change, chills, diaphoresis and fever.  HENT: Negative for congestion, sneezing, sore throat, trouble swallowing and voice change.   Eyes: Negative for pain, redness and visual disturbance.  Respiratory: Negative for apnea, cough, choking, chest tightness, shortness of breath and wheezing.   Cardiovascular: Positive for leg swelling. Negative for chest pain and palpitations.  Gastrointestinal: Negative for abdominal distention, abdominal pain, constipation, diarrhea and nausea.  Genitourinary: Positive for decreased urine volume, difficulty urinating, discharge, penile pain and penile swelling. Negative for dysuria, frequency and urgency.  Musculoskeletal: Positive for arthralgias (typical arthritis) and joint swelling. Negative for back pain, gait problem and myalgias.  Skin: Positive for wound. Negative for color change, pallor and rash.  Neurological: Negative for dizziness, tremors, syncope, speech difficulty, weakness, numbness and headaches.  Psychiatric/Behavioral: Negative for agitation and behavioral problems.  All other systems reviewed and are negative.   Immunization History  Administered Date(s) Administered  . Hepatitis B 07/06/1992  . Influenza Nasal 03/29/2011  . Influenza Split 05/02/2012, 05/11/2014  . Influenza Whole 05/28/2008  . Influenza, High Dose Seasonal PF 04/15/2017  . Influenza-Unspecified 05/02/2015, 05/06/2016  . Pneumococcal Conjugate-13 03/24/2014  . Pneumococcal Polysaccharide-23 05/28/2000  . Td 05/28/2008  . Tdap 12/27/2010  . Zoster 05/29/2007    Pertinent  Health Maintenance Due  Topic Date Due  . INFLUENZA VACCINE  Completed  . PNA vac Low Risk Adult  Completed   Fall Risk  11/27/2016 05/08/2016 11/28/2015 03/24/2014 03/22/2013  Falls in the past year? No No No No No   Functional Status Survey:    Vitals:   05/19/17 1406  BP: (!) 146/68  Pulse: 78  Resp: 20  Temp: 99 F (37.2 C)  SpO2: 95%  Weight: 261 lb 6.4 oz (118.6 kg)  Height: 6\' 2"  (1.88 m)   Body mass index is 33.56 kg/m. Physical Exam  Constitutional: He is oriented to person, place, and time. Vital signs are normal. He appears well-developed and well-nourished. He is active and cooperative. He does not appear ill. No distress.  HENT:  Head: Normocephalic and atraumatic.  Mouth/Throat: Uvula is midline, oropharynx is clear and moist and mucous membranes are normal. Mucous membranes are not pale, not dry and not cyanotic.  Eyes: Pupils are equal, round, and reactive to light. Conjunctivae, EOM and lids are normal.  Neck:  Trachea normal, normal range of motion and full passive range of motion without pain. Neck supple. No JVD present. No tracheal deviation, no edema and no erythema present. No thyromegaly present.  Cardiovascular: Normal rate, regular rhythm, normal heart sounds, intact distal pulses and normal pulses.  Exam reveals no gallop, no distant heart sounds and no friction rub.   No murmur heard. Pulses:      Dorsalis pedis pulses are 2+ on the right side, and 2+ on the left side.  2+ pitting edema left lower extremity  Pulmonary/Chest: Effort normal and breath sounds normal. No accessory muscle usage. No respiratory distress. He has no decreased breath sounds. He has no wheezes. He has no rhonchi. He has no rales. He exhibits no tenderness.  Abdominal: Soft. Normal appearance and bowel sounds are normal. He exhibits distension. He exhibits no ascites. There is no tenderness.  Musculoskeletal: He exhibits no edema or tenderness.       Left hip: He  exhibits decreased range of motion, decreased strength, swelling and laceration.  Expected osteoarthritis, stiffness, right calf is soft, supple.  Negative Homans sign.  Left calf tight, edematous  Neurological: He is alert and oriented to person, place, and time. He has normal strength.  Skin: Skin is warm and dry. Laceration noted. He is not diaphoretic. No cyanosis. No pallor. Nails show no clubbing.  Psychiatric: He has a normal mood and affect. His speech is normal and behavior is normal. Judgment and thought content normal. Cognition and memory are normal.  Nursing note and vitals reviewed.   Labs reviewed:  Recent Labs  04/15/17 1432  NA 142  K 4.4  CL 103  CO2 33*  GLUCOSE 103*  BUN 21  CREATININE 1.19  CALCIUM 9.6    Recent Labs  04/15/17 1432  AST 29  ALT 36  ALKPHOS 55  BILITOT 1.6*  PROT 6.7  ALBUMIN 4.3    Recent Labs  04/15/17 1432  WBC 8.2  NEUTROABS 5.2  HGB 14.4  HCT 42.0  MCV 97.4  PLT 298.0   Lab Results  Component Value Date   TSH 1.74 04/15/2017   Lab Results  Component Value Date   HGBA1C 5.1 04/15/2017   Lab Results  Component Value Date   CHOL 135 04/15/2017   HDL 48.50 04/15/2017   LDLCALC 69 04/15/2017   TRIG 86.0 04/15/2017   CHOLHDL 3 04/15/2017    Significant Diagnostic Results in last 30 days:  No results found.  Assessment/Plan 1.  Primary osteoarthritis of left hip  Continue working with PT/OT  Continue exercises as taught by PT/OT  Ice pack to the hip as needed for pain and/or edema  Continue Tylenol 500 mg p.o. every 6 hours as needed mild pain  Continue tramadol 50 mg 1 tablet p.o. every 4 hours as needed pain  2.  Peripheral edema  Stable  TED hose  3.  Urinary retention  In and out cath acute 6 hours as needed for retention greater than 200 mL  Bladder scan as needed  Send urine for a UA, C&S  Family/ staff Communication:   Total Time:  Documentation:  Face to  Face:  Family/Phone:   Labs/tests ordered: UA, C and S  Medication list reviewed and assessed for continued appropriateness. Monthly medication orders reviewed and signed.  Brynda Rim, NP-C Geriatrics [facility] [phone] [address], [place] [address] Cell Phone (Mon-Fri 8am-5pm):  [phone] On Call:  [phone] & follow  prompts after 5pm & weekends Office Phone:  [phone] Office Fax:  [phone]

## 2017-05-20 NOTE — ED Notes (Signed)
ED Provider at bedside. 

## 2017-05-20 NOTE — ED Triage Notes (Signed)
[facility] unable to void. States that his penis "closed at the end" and has been diagnosed with UTI. Pt states that bladder scan at facility was over 900 ml. Pt at facility for rehab after left hip replacement. Pt alert & oriented with obvious pain.

## 2017-05-20 NOTE — ED Notes (Signed)
Urine is amber and reddish colored

## 2017-05-20 NOTE — ED Notes (Signed)
Patient transported to Ultrasound 

## 2017-05-20 NOTE — ED Provider Notes (Signed)
[facility] Emergency Department Provider Note  ____________________________________________  Time seen: Approximately 7:58 PM  I have reviewed the triage vital signs and the nursing notes.   HISTORY  Chief Complaint Urinary Retention   HPI Terry Vaughn is a 83 y.o. male who presented to the emergency department for urinary retention. Patient is coming from rehabilitation. Noticed swelling and redness of his testicle and penis that started yesterday. Was started on Levaquin today at rehabilitation. Patient then developed urinary retention due to significant swelling of the penis. Patient arrives in significant distress due to abdominal distention from his bladder. He has had chills but no fever, no nausea or vomiting. Patient is also complaining of moderate constant throbbing pain in his bilateral testicles worse with palpation. No history of BPH or urinary retention in the past.  Past Medical History:  Diagnosis Date  . Bone spur 1988   left heel  . Bursitis 1987   right shoulder  . Cellulitis 1986   right leg  . Shingles 1959    Patient Active Problem List   Diagnosis Date Noted  . Left hip pain 04/15/2017  . Lower limb ulcer, heel or midfoot, right, limited to breakdown of skin (HCC) 05/20/2016  . Varicose veins of leg with swelling, right 05/20/2016  . Leg swelling 05/08/2016  . Osteoarthritis of knee 05/08/2016  . Achilles tendinitis 03/06/2016  . Pain in left foot 03/06/2016  . Cellulitis of toe of right foot 12/14/2015  . Toe ulcer, right (HCC) 12/14/2015  . Shingles 02/14/2013  . Encounter to establish care 01/28/2012  . Elevated bilirubin 01/28/2012  . Hypertriglyceridemia 01/28/2012  . Hypertension 12/12/2011  . Obesity 12/12/2011    Past Surgical History:  Procedure Laterality Date  . APPENDECTOMY  1942  . CHOLECYSTECTOMY  1966  . EYE SURGERY  2015   catarcts extracted  . MELANOMA EXCISION  04.2010   mole removal right arm   . TONSILLECTOMY AND ADENOIDECTOMY  1944  . TOTAL HIP ARTHROPLASTY  1999   right hip    Prior to Admission medications   Medication Sig Start Date End Date Taking? Authorizing Provider  acetaminophen (TYLENOL) 500 MG tablet Take 500 mg by mouth every 6 (six) hours as needed. Maximum 3 grams in 24 hours    [provider]  alum & mag hydroxide-simeth (MAALOX PLUS) 400-400-40 MG/5ML suspension Take 30 mLs by mouth every 4 (four) hours as needed for indigestion.    [provider]  aspirin 81 MG chewable tablet Chew 81 mg by mouth daily.    [provider]  bisacodyl (FLEET) 10 MG/30ML ENEM Place 10 mg rectally 2 (two) times daily as needed.    [provider]  diphenhydrAMINE (BENADRYL) 25 mg capsule Take 25 mg by mouth as needed for itching.    [provider]  docusate sodium (COLACE) 100 MG capsule Take 200 mg by mouth daily.    [provider]  Ergocalciferol 400 units TABS Take 1 tablet by mouth daily.    [provider]  meloxicam (MOBIC) 15 MG tablet Take 15 mg by mouth daily.     [provider]  Multiple Vitamins-Minerals (CENTRUM SILVER ULTRA MENS) TABS Take 1 each by mouth daily.    [provider]  omeprazole (PRILOSEC) 20 MG capsule Take 1 capsule (20 mg total) by mouth daily. 04/25/16   Allegra Grana, FNP  phenazopyridine (PYRIDIUM) 100 MG tablet Take 100 mg by mouth 3 (three) times daily. 05/19/17  05/21/17  [provider]  sennosides-docusate sodium (SENOKOT-S) 8.6-50 MG tablet Take 2 tablets by mouth 2 (two) times daily.    [provider]  tamsulosin (FLOMAX) 0.4 MG CAPS capsule Take 0.4 mg by mouth at bedtime.    [provider]  traMADol (ULTRAM) 50 MG tablet Take 50 mg by mouth every 4 (four) hours as needed.    [provider]    Allergies Patient has no known allergies.  Family History  Problem Relation Age of Onset  . Clotting disorder Mother   .  Heart disease Father 2  . Cancer Sister        breast  . Cerebral aneurysm Brother        [place]  . Cancer Daughter        breast    Social History Social History  Substance Use Topics  . Smoking status: Never Smoker  . Smokeless tobacco: Never Used  . Alcohol use No    Review of Systems  Constitutional: Negative for fever. Eyes: Negative for visual changes. ENT: Negative for sore throat. Neck: No neck pain  Cardiovascular: Negative for chest pain. Respiratory: Negative for shortness of breath. Gastrointestinal: Negative for abdominal pain, vomiting or diarrhea. Genitourinary: + urinary retention, penile and scrotum pain and swelling Musculoskeletal: Negative for back pain. Skin: Negative for rash. Neurological: Negative for headaches, weakness or numbness. Psych: No SI or HI  ____________________________________________   PHYSICAL EXAM:  VITAL SIGNS: ED Triage Vitals  Enc Vitals Group     BP 05/20/17 1751 (!) 140/55     Pulse Rate 05/20/17 1751 (!) 102     Resp 05/20/17 1751 20     Temp 05/20/17 1751 98.1 F (36.7 C)     Temp Source 05/20/17 1751 Oral     SpO2 05/20/17 1749 100 %     Weight 05/20/17 1751 261 lb (118.4 kg)     Height 05/20/17 1751 6\' 1"  (1.854 m)     Head Circumference --      Peak Flow --      Pain Score 05/20/17 1750 8     Pain Loc --      Pain Edu? --      Excl. in GC? --     Constitutional: Alert and oriented, in significant distress due to pain.  HEENT:      Head: Normocephalic and atraumatic.         Eyes: Conjunctivae are normal. Sclera is non-icteric.       Mouth/Throat: Mucous membranes are moist.       Neck: Supple with no signs of meningismus. Cardiovascular: tachycardic with regular rhythm. No murmurs, gallops, or rubs. 2+ symmetrical distal pulses are present in all extremities. No JVD. Respiratory: Normal respiratory effort. Lungs are clear to auscultation bilaterally. No wheezes, crackles, or rhonchi.    Gastrointestinal: very distended bladder which is tender to palpation. positive bowel sounds. No rebound or guarding. Genitourinary: No CVA tenderness. Bilateral testicles are descended with diffuse tenderness to palpation, scrotum is erythematous and swollen, penis is swollen with pus coming from urethra, no crepitus or bullae. Musculoskeletal: Nontender with normal range of motion in all extremities. No edema, cyanosis, or erythema of extremities. Neurologic: Normal speech and language. Face is symmetric. Moving all extremities. No gross focal neurologic deficits are appreciated. Skin: Skin is warm, dry and intact. No rash noted. Psychiatric: Mood and affect are normal. Speech and behavior are normal.  ____________________________________________   LABS (all labs ordered are listed,  but only abnormal results are displayed)  Labs Reviewed  URINALYSIS, COMPLETE (UACMP) WITH MICROSCOPIC - Abnormal; Notable for the following:       Result Value   Color, Urine YELLOW (*)    APPearance CLEAR (*)    Hgb urine dipstick MODERATE (*)    Leukocytes, UA TRACE (*)    Bacteria, UA MANY (*)    All other components within normal limits  CBC WITH DIFFERENTIAL/PLATELET - Abnormal; Notable for the following:    WBC 23.9 (*)    RBC 3.36 (*)    Hemoglobin 10.6 (*)    HCT 31.2 (*)    RDW 14.9 (*)    Platelets 653 (*)    Neutro Abs 22.8 (*)    Lymphs Abs 0.6 (*)    All other components within normal limits  BASIC METABOLIC PANEL - Abnormal; Notable for the following:    Sodium 128 (*)    Potassium 5.2 (*)    Chloride 93 (*)    Glucose, Bld 105 (*)    BUN 52 (*)    Creatinine, Ser 3.53 (*)    Calcium 8.0 (*)    GFR calc non Af Amer 15 (*)    GFR calc Af Amer 17 (*)    All other components within normal limits  URINE CULTURE  CULTURE, BLOOD (ROUTINE X 2)  CULTURE, BLOOD (ROUTINE X 2)  LACTIC ACID, PLASMA  LACTIC ACID, PLASMA   ____________________________________________  EKG  ED ECG  REPORT I, Nita Sicklearolina Krisi Azua, the attending physician, personally viewed and interpreted this ECG.  Normal sinus rhythm, rate of 91, right bundle branch block, normal QTC, right axis deviation, no ST elevations or depressions. Unchanged from prior from September 2018.  ____________________________________________  RADIOLOGY  Scrotum US: 1. 3.4 x 1.6 x 1.5 cm probable complicated hydrocele lateral to the left testicle. This could represent a hydrocele complicated by infection or hemorrhage. 2. Diffuse scrotal skin thickening compatible with diffuse scrotal cellulitis. 3. Normal appearing testicles and epididymides. ____________________________________________   PROCEDURES  Procedure(s) performed: None Procedures Critical Care performed: yes  CRITICAL CARE Performed by: Nita Sicklearolina Tyona Nilsen  ?  Total critical care time: 35 min  Critical care time was exclusive of separately billable procedures and treating other patients.  Critical care was necessary to treat or prevent imminent or life-threatening deterioration.  Critical care was time spent personally by me on the following activities: development of treatment plan with patient and/or surrogate as well as nursing, discussions with consultants, evaluation of patient's response to treatment, examination of patient, obtaining history from patient or surrogate, ordering and performing treatments and interventions, ordering and review of laboratory studies, ordering and review of radiographic studies, pulse oximetry and re-evaluation of patient's condition.  ____________________________________________   INITIAL IMPRESSION / ASSESSMENT AND PLAN / ED COURSE  83 y.o. male who presented to the emergency department for urinary retention in the setting of 24 hours of penile swelling and bilateral scrotum swelling. Patient arrives with urinary retention, severely distended bladder. We were able to successfully place a Foley catheter after  several attempts due to difficulty to visualize urethra because of the swelling. Foley catheter drained 1 L of purulent urine. Testicular ultrasound showing 3.4 x 1.6 x 1.5 cm complicated hydrocele. Blood work showing a white count of 23.9, acute kidney injury with a creatinine of 3.53, hyponatremia with a sodium of 128, and hyperkalemia with a K of 5.2. Lactic and blood cultures are pending. Consulted Dr. Apolinar JunesBrandon, Urology who recommended admission, agrees with  IV rocephin. She will see patient in the am. There is no clinical evidence of necrotizing infection with no crepitus, bullae, or air on [place]. EKG was done once hyperkalemia was noted and there is no changes. Hyperkalemia in the setting of retention which has been relieved. Will give 2L NS. Will give bicarbonate, insulin, D50 for hyperkalemia.       As part of my medical decision making, I reviewed the following data within the electronic MEDICAL RECORD NUMBER Nursing notes reviewed and incorporated, Labs reviewed , Radiograph reviewed , Discussed with admitting physician , A consult was requested and obtained from this/these consultant(s) Urology, Notes from prior ED visits and [place] Controlled Substance Database    Pertinent labs & imaging results that were available during my care of the patient were reviewed by me and considered in my medical decision making (see chart for details).    ____________________________________________   FINAL CLINICAL IMPRESSION(S) / ED DIAGNOSES  Final diagnoses:  Infected hydrocele  Pyelonephritis  Urinary retention  Acute renal insufficiency  Hyperkalemia  Sepsis, due to unspecified organism [facility])      NEW MEDICATIONS STARTED DURING THIS VISIT:  New Prescriptions   No medications on file     Note:  This document was prepared using Dragon voice recognition software and may include unintentional dictation errors.    Nita Sickle, MD 05/20/17 2016

## 2017-05-20 NOTE — ED Notes (Signed)
Coude urinary catheter placed

## 2017-05-20 NOTE — Progress Notes (Signed)
Pharmacy Antibiotic Note  Terry Vaughn is a 83 y.o. male admitted on 05/20/2017 with UTI.  Pharmacy has been consulted for ceftriaxone dosing.  Plan: Ceftriaxone 1 g IV daily  Height: 6\' 1"  (185.4 cm) Weight: 261 lb (118.4 kg) IBW/kg (Calculated) : 79.9  Temp (24hrs), Avg:98.1 F (36.7 C), Min:98.1 F (36.7 C), Max:98.1 F (36.7 C)   Recent Labs Lab 05/20/17 1825 05/20/17 1940  WBC 23.9*  --   CREATININE 3.53*  --   LATICACIDVEN  --  2.4*    Estimated Creatinine Clearance: 21.7 mL/min (A) (by C-G formula based on SCr of 3.53 mg/dL (H)).    No Known Allergies  Antimicrobials this admission: ceftriaxone 10/24 >>   Dose adjustments this admission:  Microbiology results: 10/24 BCx: Sent 10/24 UCx: Sent   Thank you for allowing pharmacy to be a part of this patient's care.  Cindi CarbonMary M Linder Prajapati, PharmD Clinical Pharmacist 05/20/2017 10:33 PM

## 2017-05-21 DIAGNOSIS — N431 Infected hydrocele: Secondary | ICD-10-CM

## 2017-05-21 DIAGNOSIS — N289 Disorder of kidney and ureter, unspecified: Secondary | ICD-10-CM

## 2017-05-21 DIAGNOSIS — A419 Sepsis, unspecified organism: Principal | ICD-10-CM

## 2017-05-21 LAB — CBC
HCT: 26 % — ABNORMAL LOW (ref 40.0–52.0)
Hemoglobin: 8.8 g/dL — ABNORMAL LOW (ref 13.0–18.0)
MCH: 31.2 pg (ref 26.0–34.0)
MCHC: 33.6 g/dL (ref 32.0–36.0)
MCV: 92.7 fL (ref 80.0–100.0)
Platelets: 504 10*3/uL — ABNORMAL HIGH (ref 150–440)
RBC: 2.81 MIL/uL — ABNORMAL LOW (ref 4.40–5.90)
RDW: 14.9 % — ABNORMAL HIGH (ref 11.5–14.5)
WBC: 19.6 10*3/uL — ABNORMAL HIGH (ref 3.8–10.6)

## 2017-05-21 LAB — BASIC METABOLIC PANEL
Anion gap: 8 (ref 5–15)
BUN: 47 mg/dL — ABNORMAL HIGH (ref 6–20)
CO2: 25 mmol/L (ref 22–32)
Calcium: 7.4 mg/dL — ABNORMAL LOW (ref 8.9–10.3)
Chloride: 101 mmol/L (ref 101–111)
Creatinine, Ser: 2.74 mg/dL — ABNORMAL HIGH (ref 0.61–1.24)
GFR calc Af Amer: 23 mL/min — ABNORMAL LOW (ref >60)
GFR calc non Af Amer: 20 mL/min — ABNORMAL LOW (ref >60)
Glucose, Bld: 93 mg/dL (ref 65–99)
Potassium: 4.7 mmol/L (ref 3.5–5.1)
Sodium: 134 mmol/L — ABNORMAL LOW (ref 135–145)

## 2017-05-21 LAB — APTT: aPTT: 36 seconds (ref 24–36)

## 2017-05-21 LAB — PROTIME-INR
INR: 1.39
Prothrombin Time: 16.9 seconds — ABNORMAL HIGH (ref 11.4–15.2)

## 2017-05-21 LAB — URINE CULTURE: Culture: >=100000 — AB

## 2017-05-21 LAB — LACTIC ACID, PLASMA
Lactic Acid, Venous: 2.4 mmol/L (ref 0.5–1.9)
Lactic Acid, Venous: 2 mmol/L (ref 0.5–1.9)

## 2017-05-21 LAB — MRSA PCR SCREENING: MRSA by PCR: NEGATIVE

## 2017-05-21 LAB — PROCALCITONIN: Procalcitonin: 3.94 ng/mL

## 2017-05-21 MED ORDER — ACETAMINOPHEN 325 MG PO TABS
650.0000 mg | ORAL_TABLET | Freq: Four times a day (QID) | ORAL | Status: DC | PRN
  Administered 2017-05-21 – 2017-05-22 (×4): 650 mg via ORAL
  Filled 2017-05-21 (×4): qty 2

## 2017-05-21 MED ORDER — FINASTERIDE 5 MG PO TABS
5.0000 mg | ORAL_TABLET | Freq: Every day | ORAL | Status: DC
  Administered 2017-05-21 – 2017-05-22 (×2): 5 mg via ORAL
  Filled 2017-05-21 (×2): qty 1

## 2017-05-21 NOTE — Progress Notes (Signed)
[employer] - [place] at [facility]   PATIENT NAME: Terry Vaughn    MR#:  [id number]  DATE OF BIRTH:  April 14, 1933  SUBJECTIVE:   Patient here due to sepsis secondary to urinary tract infection and also urinary retention. Status post Foley catheter placement. Also noted to be in acute kidney injury and creatinine is improving with IV fluids. White cell count trending down. Afebrile, hemodynamically stable. Patient says he feels better.  REVIEW OF SYSTEMS:    Review of Systems  Constitutional: Negative for chills and fever.  HENT: Negative for congestion and tinnitus.   Eyes: Negative for blurred vision and double vision.  Respiratory: Negative for cough, shortness of breath and wheezing.   Cardiovascular: Negative for chest pain, orthopnea and PND.  Gastrointestinal: Negative for abdominal pain, diarrhea, nausea and vomiting.  Genitourinary: Negative for dysuria and hematuria.  Neurological: Negative for dizziness, sensory change and focal weakness.  All other systems reviewed and are negative.   Nutrition: heart healthy Tolerating Diet: Yes Tolerating PT: Await Eval.   DRUG ALLERGIES:  No Known Allergies  VITALS:  Blood pressure (!) 125/58, pulse 86, temperature 97.8 F (36.6 C), resp. rate 18, height 6\' 1"  (1.854 m), weight 118.4 kg (261 lb), SpO2 95 %.  PHYSICAL EXAMINATION:   Physical Exam  GENERAL:  83 y.o.-year-old patient lying in bed in no acute distress.  EYES: Pupils equal, round, reactive to light and accommodation. No scleral icterus. Extraocular muscles intact.  HEENT: Head atraumatic, normocephalic. Oropharynx and nasopharynx clear.  NECK:  Supple, no jugular venous distention. No thyroid enlargement, no tenderness.  LUNGS: Normal breath sounds bilaterally, no wheezing, rales, rhonchi. No use of accessory muscles of respiration.  CARDIOVASCULAR: S1, S2 normal. No murmurs, rubs, or gallops.  ABDOMEN: Soft, nontender, nondistended. Bowel sounds  present. No organomegaly or mass.  EXTREMITIES: No cyanosis, clubbing, +1-2 edema b/l.    NEUROLOGIC: Cranial nerves II through XII are intact. No focal Motor or sensory deficits b/l.   PSYCHIATRIC: The patient is alert and oriented x 3.  SKIN: No obvious rash, lesion, or ulcer.   GU - + scrotal edema and foley cath in place with yellow urine draining.   LABORATORY PANEL:   CBC  Recent Labs Lab 05/21/17 0131  WBC 19.6*  HGB 8.8*  HCT 26.0*  PLT 504*   ------------------------------------------------------------------------------------------------------------------  Chemistries   Recent Labs Lab 05/21/17 0131  NA 134*  K 4.7  CL 101  CO2 25  GLUCOSE 93  BUN 47*  CREATININE 2.74*  CALCIUM 7.4*   ------------------------------------------------------------------------------------------------------------------  Cardiac Enzymes No results for input(s): TROPONINI in the last 168 hours. ------------------------------------------------------------------------------------------------------------------  RADIOLOGY:  Dg Chest Port 1 View  Result Date: 05/20/2017 CLINICAL DATA:  Sepsis EXAM: PORTABLE CHEST 1 VIEW COMPARISON:  None. FINDINGS: The heart size and mediastinal contours are within normal limits. Both lungs are clear. The visualized skeletal structures are unremarkable. IMPRESSION: No active disease. Electronically Signed   By: Alcide Clever M.D.   On: 05/20/2017 21:05   [place] Scrotom W/doppler  Result Date: 05/20/2017 CLINICAL DATA:  Scrotal pain, swelling and erythema. EXAM: SCROTAL ULTRASOUND DOPPLER ULTRASOUND OF THE TESTICLES TECHNIQUE: Complete ultrasound examination of the testicles, epididymis, and other scrotal structures was performed. Color and spectral Doppler ultrasound were also utilized to evaluate blood flow to the testicles. COMPARISON:  None. FINDINGS: Right testicle Measurements: 4.5 x 2.5 x 2.3 cm. No mass or microlithiasis visualized. Left testicle  Measurements: 4.2 x 2.6 x 2.5 cm. No mass  or microlithiasis visualized. Right epididymis:  Normal in size and appearance. Left epididymis:  Normal in size and appearance. Hydrocele: Oval cystic and solid appearing area along the lateral aspect of the left testicle measuring 3.4 x 1.6 x 1.5 cm. No internal blood flow seen within this area with color Doppler. Varicocele:  None visualized. Other: Diffuse scrotal skin thickening. Pulsed Doppler interrogation of both testes demonstrates normal low resistance arterial and venous waveforms bilaterally. IMPRESSION: 1. 3.4 x 1.6 x 1.5 cm probable complicated hydrocele lateral to the left testicle. This could represent a hydrocele complicated by infection or hemorrhage. 2. Diffuse scrotal skin thickening compatible with diffuse scrotal cellulitis. 3. Normal appearing testicles and epididymides. Electronically Signed   By: Beckie SaltsSteven  Reid M.D.   On: 05/20/2017 19:10     ASSESSMENT AND PLAN:   83 year old male with past medical history of BPH, osteoarthritis, shingles, who presented to the hospital due to abdominal pain and noted to have urinary retention and also noted to have sepsis secondary to UTI.  1. Sepsis-this is secondary to a urinary tract infection/possibly infected hydrocele.  -seen by urology and you do not think that the patient has infected hydrocele. They recommended continuing Foley catheter and IV antibiotics for UTI and patient may need to go home with a Foley catheter for 7-10 days. -Continue IV ceftriaxone, follow urine cultures.  2. Urinary tract infection-continue IV ceftriaxone, follow urine cultures.  3. Urinary retention-secondary to BPH combined with underlying UTI. Seen by urology and no plans for acute surgical intervention. Continue Foley catheter and patient feels better as his urinary retention has improved. -Continue Flomax, finasteride.  4. Acute on chronic kidney injury-secondary to urinary retention. -Creatinine improving as  the retention has resolved and with IV fluids. Will follow BUN and creatinine and urine output.  5. Leukocytosis-secondary to the urinary tract infection and sepsis. Trending down with IV antibiotics will continue to monitor.  6. Status post recent left hip replacement-patient was at short-term rehabilitation getting physical therapy. -We'll resume rehabilitation when tolerated, continue Hep SQ for DVT prophylaxis.  All the records are reviewed and case discussed with Care Management/Social Worker. Management plans discussed with the patient, family and they are in agreement.  CODE STATUS: Limited Code  DVT Prophylaxis: Hep. SQ  TOTAL TIME TAKING CARE OF THIS PATIENT: 30 minutes.   POSSIBLE D/C IN 2-3 DAYS, DEPENDING ON CLINICAL CONDITION.   [place] SirenSAINANI,Sanae Willetts J M.D on 05/21/2017 at 3:03 PM  Between 7am to 6pm - Pager - [phone]  After 6pm go to [email or web address] - Scientist, research (life sciences)password [email or web address]  [facility] [place] [facility]  Office  [phone]  CC: Primary care physician; Allegra GranaArnett, [name], FNP

## 2017-05-21 NOTE — Consult Note (Signed)
Urology Consult  I have been asked to see the patient by Dr. Don PerkingVeronese, for evaluation and management of UTI/ scrotal swelling/ urinary retention  Chief Complaint: Difficulty urinating  History of Present Illness: Terry Vaughn is a 83 y.o. year old rehab patient who presented to the ER yesterday evening with inability to urinate, penoscrotal swelling.  Due to swelling, Foley catheter placement was difficult, but ultimately a coud catheter was placed which drained greater than a liter of urine.  He underwent recent anterior hip arthroplasty on October 5 with prolonged recovery.  He reports that ever since surgery, he is issues with urinating.  He voids very small frequent amounts.  He also had significant issues with constipation.  Approximately 4 days ago, he developed penoscrotal swelling which has progressed steadily since that time.  He finally came to the ER yesterday when he was unable to void and uncomfortable.  He is also been having some low-grade temperatures up to 100 over the past week.  No previous history of voiding difficulty prior to orthopedic surgery.  He takes no BPH medication.  He was started on Flomax following his hip surgery.    In the ER, he was hemodynamically stable but had a significant leukocytosis  (23.9), positive urinalysis, elevated lactate, Cr 3.53 from baseline 1.19.    Past Medical History:  Diagnosis Date  . Bone spur 1988   left heel  . Bursitis 1987   right shoulder  . Cellulitis 1986   right leg  . Shingles 1959    Past Surgical History:  Procedure Laterality Date  . APPENDECTOMY  1942  . CHOLECYSTECTOMY  1966  . EYE SURGERY  2015   catarcts extracted  . MELANOMA EXCISION  04.2010   mole removal right arm  . TONSILLECTOMY AND ADENOIDECTOMY  1944  . TOTAL HIP ARTHROPLASTY  1999   right hip    Home Medications:  No outpatient prescriptions have been marked as taking for the 05/20/17 encounter [facility](Hospital Encounter).    Allergies: No  Known Allergies  Family History  Problem Relation Age of Onset  . Clotting disorder Mother   . Heart disease Father 556  . Cancer Sister        breast  . Cerebral aneurysm Brother        hemmorage  . Cancer Daughter        breast    Social History:  reports that he has never smoked. He has never used smokeless tobacco. He reports that he does not drink alcohol or use drugs.  ROS: A complete review of systems was performed.  All systems are negative except for pertinent findings as noted.  Physical Exam:  Vital signs in last 24 hours: Temp:  [97.8 F (36.6 C)-98.1 F (36.7 C)] 97.8 F (36.6 C) (10/25 0529) Pulse Rate:  [85-102] 86 (10/25 0529) Resp:  [18-21] 18 (10/25 0529) BP: (119-153)/(53-62) 125/58 (10/25 0529) SpO2:  [95 %-100 %] 95 % (10/25 0529) Weight:  [261 lb (118.4 kg)] 261 lb (118.4 kg) (10/24 1751) Constitutional:  Alert and oriented, No acute distress.  Eating breakfast.  Wife at bedside. HEENT: [place] AT, moist mucus membranes.  Trachea midline, no masses Cardiovascular: Mild bilateral lower extremity edema. Respiratory: Normal respiratory effort, no respiratory distress GI: Abdomen is soft, nontender, nondistended, no abdominal masses GU: Uncircumcised phallus with mild penile edema.  Foley catheter in place draining yellow urine.  Mildly erythematous thickened scrotal skin, left greater than right.  Right testicle is palpably normal.  Left testicle difficult to palpate due to fullness on the side, tender.  No crepitus.  Perineum intact. Skin: Bilateral leg ecchymosis appreciated. Neurologic: Grossly intact, no focal deficits, moving all 4 extremities Psychiatric: Normal mood and affect  Laboratory Data:   Recent Labs  05/20/17 1825 05/21/17 0131  WBC 23.9* 19.6*  HGB 10.6* 8.8*  HCT 31.2* 26.0*    Recent Labs  05/20/17 1825 05/21/17 0131  NA 128* 134*  K 5.2* 4.7  CL 93* 101  CO2 22 25  GLUCOSE 105* 93  BUN 52* 47*  CREATININE 3.53* 2.74*    CALCIUM 8.0* 7.4*    Recent Labs  05/20/17 2327  INR 1.39   No results for input(s): LABURIN in the last 72 hours. Results for orders placed or performed during the hospital encounter of 05/19/17  Urine Culture     Status: Abnormal (Preliminary result)   Collection Time: 05/19/17 12:00 PM  Result Value Ref Range Status   Specimen Description URINE, CLEAN CATCH  Final   Special Requests NONE  Final   Culture >=100,000 COLONIES/mL GRAM NEGATIVE RODS (A)  Final   Report Status PENDING  Incomplete   Lactic Acid, Venous    Component Value Date/Time   LATICACIDVEN 2.0 (HH) 05/21/2017 0131    Radiologic Imaging: Dg Chest Port 1 View  Result Date: 05/20/2017 CLINICAL DATA:  Sepsis EXAM: PORTABLE CHEST 1 VIEW COMPARISON:  None. FINDINGS: The heart size and mediastinal contours are within normal limits. Both lungs are clear. The visualized skeletal structures are unremarkable. IMPRESSION: No active disease. Electronically Signed   By: Alcide Clever M.D.   On: 05/20/2017 21:05   [place] Scrotom W/doppler  Result Date: 05/20/2017 CLINICAL DATA:  Scrotal pain, swelling and erythema. EXAM: SCROTAL ULTRASOUND DOPPLER ULTRASOUND OF THE TESTICLES TECHNIQUE: Complete ultrasound examination of the testicles, epididymis, and other scrotal structures was performed. Color and spectral Doppler ultrasound were also utilized to evaluate blood flow to the testicles. COMPARISON:  None. FINDINGS: Right testicle Measurements: 4.5 x 2.5 x 2.3 cm. No mass or microlithiasis visualized. Left testicle Measurements: 4.2 x 2.6 x 2.5 cm. No mass or microlithiasis visualized. Right epididymis:  Normal in size and appearance. Left epididymis:  Normal in size and appearance. Hydrocele: Oval cystic and solid appearing area along the lateral aspect of the left testicle measuring 3.4 x 1.6 x 1.5 cm. No internal blood flow seen within this area with color Doppler. Varicocele:  None visualized. Other: Diffuse scrotal skin  thickening. Pulsed Doppler interrogation of both testes demonstrates normal low resistance arterial and venous waveforms bilaterally. IMPRESSION: 1. 3.4 x 1.6 x 1.5 cm probable complicated hydrocele lateral to the left testicle. This could represent a hydrocele complicated by infection or hemorrhage. 2. Diffuse scrotal skin thickening compatible with diffuse scrotal cellulitis. 3. Normal appearing testicles and epididymides. Electronically Signed   By: Beckie Salts M.D.   On: 05/20/2017 19:10    Impression/Plan:  83 year old male with sepsis related to urinary source, UTI, urinary retention, scrotal cellulitis with complex left hydrocele.  No evidence of Fournier's gangrene or abscess.  1.  Urinary retention-based on patient's history, it sounds like he has been in subacute retention since the time of hip arthroplasty.  Recommend maintaining Foley catheter for the time being, at least times 1 week.  Continue Flomax.  In addition, will add finasteride.  Outpatient voiding trial.  2.  UTI/scrotal cellulitis/ complex left hydrocele- currently on ceftriaxone with improving leukocytosis.  Urine culture growing 100,000 colonies of gram-negative  rods.  Blood cultures negative.  Awaiting speciation.  Adjust as needed.  No role for surgical debridement or intervention at this time.  Supportive care.  Scrotal elevation.  3.  Acute renal failure -creatinine improving with urinary decompression.  Continue to trend creatinine.  Avoid nephrotoxic agents.   05/21/2017, 8:00 AM  Vanna Scotland,  MD

## 2017-05-21 NOTE — Progress Notes (Signed)
Notified MD about patient lab results; No new  orders at this time,Will  continue to monitor

## 2017-05-22 ENCOUNTER — Telehealth: Payer: Self-pay | Admitting: Urology

## 2017-05-22 LAB — CBC
HCT: 25 % — ABNORMAL LOW (ref 40.0–52.0)
Hemoglobin: 8.5 g/dL — ABNORMAL LOW (ref 13.0–18.0)
MCH: 31.7 pg (ref 26.0–34.0)
MCHC: 33.9 g/dL (ref 32.0–36.0)
MCV: 93.4 fL (ref 80.0–100.0)
Platelets: 467 10*3/uL — ABNORMAL HIGH (ref 150–440)
RBC: 2.68 MIL/uL — ABNORMAL LOW (ref 4.40–5.90)
RDW: 14.9 % — ABNORMAL HIGH (ref 11.5–14.5)
WBC: 14.9 10*3/uL — ABNORMAL HIGH (ref 3.8–10.6)

## 2017-05-22 LAB — BASIC METABOLIC PANEL
Anion gap: 3 — ABNORMAL LOW (ref 5–15)
BUN: 36 mg/dL — ABNORMAL HIGH (ref 6–20)
CO2: 27 mmol/L (ref 22–32)
Calcium: 7.2 mg/dL — ABNORMAL LOW (ref 8.9–10.3)
Chloride: 106 mmol/L (ref 101–111)
Creatinine, Ser: 1.31 mg/dL — ABNORMAL HIGH (ref 0.61–1.24)
GFR calc Af Amer: 57 mL/min — ABNORMAL LOW (ref >60)
GFR calc non Af Amer: 49 mL/min — ABNORMAL LOW (ref >60)
Glucose, Bld: 100 mg/dL — ABNORMAL HIGH (ref 65–99)
Potassium: 3.9 mmol/L (ref 3.5–5.1)
Sodium: 136 mmol/L (ref 135–145)

## 2017-05-22 MED ORDER — CEFUROXIME AXETIL 250 MG PO TABS: 250.0000 mg | ORAL_TABLET | Freq: Two times a day (BID) | ORAL | 0 refills | Status: AC

## 2017-05-22 MED ORDER — PHENAZOPYRIDINE HCL 100 MG PO TABS: 100.0000 mg | ORAL_TABLET | Freq: Three times a day (TID) | ORAL | 0 refills | Status: AC

## 2017-05-22 MED ORDER — FINASTERIDE 5 MG PO TABS: 5.0000 mg | ORAL_TABLET | Freq: Every day | ORAL | Status: DC

## 2017-05-22 NOTE — Progress Notes (Signed)
[facility]

## 2017-05-22 NOTE — Telephone Encounter (Signed)
[facility]  [facility]

## 2017-05-22 NOTE — Progress Notes (Signed)
Urology Consult Follow Up  Subjective: Reports he is overall feeling better.  Foley draining well.  Labs normalizing.  Urine culture growing pansensitive E. coli.  Anti-infectives: Anti-infectives    Start     Dose/Rate Route Frequency Ordered Stop   05/21/17 2000  cefTRIAXone (ROCEPHIN) 1 g in dextrose 5 % 50 mL IVPB - Premix  Status:  Discontinued     1 g 100 mL/hr over 30 Minutes Intravenous Every 24 hours 05/20/17 2044 05/20/17 2144   05/21/17 1800  cefTRIAXone (ROCEPHIN) 1 g in dextrose 5 % 50 mL IVPB     1 g 100 mL/hr over 30 Minutes Intravenous Every 24 hours 05/20/17 2144     05/20/17 1830  cefTRIAXone (ROCEPHIN) 1 g in dextrose 5 % 50 mL IVPB - Premix     1 g 100 mL/hr over 30 Minutes Intravenous  Once 05/20/17 1827 05/20/17 2201      Current Facility-Administered Medications  Medication Dose Route Frequency Provider Last Rate Last Dose  . 0.9 %  sodium chloride infusion   Intravenous Once Don Perking, [place], MD      . 0.9 %  sodium chloride infusion   Intravenous Continuous Angelina Ok D, MD 125 mL/hr at 05/21/17 2256    . acetaminophen (TYLENOL) tablet 650 mg  650 mg Oral Q6H PRN Altamese Dilling, MD   650 mg at 05/22/17 0340  . alum & mag hydroxide-simeth (MAALOX PLUS) 400-400-40 MG/5ML suspension 30 mL  30 mL Oral Q4H PRN Salary, Montell D, MD      . aspirin chewable tablet 81 mg  81 mg Oral Daily Salary, Montell D, MD   81 mg at 05/21/17 1057  . bisacodyl (DULCOLAX) suppository 10 mg  10 mg Rectal BID PRN Salary, Montell D, MD      . cefTRIAXone (ROCEPHIN) 1 g in dextrose 5 % 50 mL IVPB  1 g Intravenous Q24H Cindi Carbon, [facility]   Stopped at 05/21/17 1610  . cholecalciferol (VITAMIN D) tablet 400 Units  400 Units Oral Daily Salary, Jetty Duhamel D, MD   400 Units at 05/21/17 1059  . diphenhydrAMINE (BENADRYL) capsule 25 mg  25 mg Oral PRN Salary, Montell D, MD      . docusate sodium (COLACE) capsule 200 mg  200 mg Oral Daily Salary, Montell D, MD      . fentaNYL  (SUBLIMAZE) injection 50 mcg  50 mcg Intravenous Once Don Perking, [place], MD      . finasteride (PROSCAR) tablet 5 mg  5 mg Oral Daily Vanna Scotland, MD   5 mg at 05/21/17 1427  . heparin injection 5,000 Units  5,000 Units Subcutaneous Q8H Salary, Montell D, MD   5,000 Units at 05/22/17 0540  . HYDROcodone-acetaminophen (NORCO/VICODIN) 5-325 MG per tablet 1-2 tablet  1-2 tablet Oral Q4H PRN Salary, Montell D, MD      . multivitamin with minerals tablet 1 tablet  1 tablet Oral Daily Salary, Montell D, MD   1 tablet at 05/21/17 1057  . ondansetron (ZOFRAN) tablet 4 mg  4 mg Oral Q6H PRN Salary, Montell D, MD       Or  . ondansetron (ZOFRAN) injection 4 mg  4 mg Intravenous Q6H PRN Salary, Montell D, MD      . pantoprazole (PROTONIX) EC tablet 40 mg  40 mg Oral Daily Salary, Montell D, MD   40 mg at 05/21/17 1057  . senna-docusate (Senokot-S) tablet 2 tablet  2 tablet Oral BID Salary, Evelena Asa, MD  2 tablet at 05/21/17 2124  . tamsulosin (FLOMAX) capsule 0.4 mg  0.4 mg Oral QHS Salary, Montell D, MD   0.4 mg at 05/21/17 2124  . temazepam (RESTORIL) capsule 7.5 mg  7.5 mg Oral QHS PRN Salary, Montell D, MD         Objective: Vital signs in last 24 hours: Temp:  [97.9 F (36.6 C)-98 F (36.7 C)] 98 F (36.7 C) (10/26 0603) Pulse Rate:  [84-85] 84 (10/26 0603) Resp:  [20] 20 (10/26 0603) BP: (137-139)/(58-70) 137/70 (10/26 0603) SpO2:  [95 %-96 %] 95 % (10/26 0603)  Intake/Output from previous day: 10/25 0701 - 10/26 0700 In: 2337.4 [P.O.:840; I.V.:1497.4] Out: 3000 [Urine:3000] Intake/Output this shift: No intake/output data recorded.   Physical Exam Constitutional:  Alert and oriented, No acute distress.  HEENT: [place] AT, moist mucus membranes.  Trachea midline, no masses Respiratory: Normal respiratory effort, no respiratory distress GI: Abdomen is soft, nontender, nondistended, no abdominal masses GU: Uncircumcised phallus with miminal penile edema.  Foley catheter in place  draining yellow urine.    Improving scrotal edema.  Persistent firmness of left hemiscrotum which is smaller today.  No residual erythema.  No crepitus. Skin: Bilateral leg ecchymosis appreciated. Neurologic: Grossly intact, no focal deficits, moving all 4 extremities Psychiatric: Normal mood and affect    Lab Results:   Recent Labs  05/21/17 0131 05/22/17 0413  WBC 19.6* 14.9*  HGB 8.8* 8.5*  HCT 26.0* 25.0*  PLT 504* 467*   BMET  Recent Labs  05/21/17 0131 05/22/17 0413  NA 134* 136  K 4.7 3.9  CL 101 106  CO2 25 27  GLUCOSE 93 100*  BUN 47* 36*  CREATININE 2.74* 1.31*  CALCIUM 7.4* 7.2*   PT/INR  Recent Labs  05/20/17 2327  LABPROT 16.9*  INR 1.39   ABG No results for input(s): PHART, HCO3 in the last 72 hours.  Invalid input(s): PCO2, PO2  Studies/Results: Dg Chest Port 1 View  Result Date: 05/20/2017 CLINICAL DATA:  Sepsis EXAM: PORTABLE CHEST 1 VIEW COMPARISON:  None. FINDINGS: The heart size and mediastinal contours are within normal limits. Both lungs are clear. The visualized skeletal structures are unremarkable. IMPRESSION: No active disease. Electronically Signed   By: Alcide CleverMark  Lukens M.D.   On: 05/20/2017 21:05   [place] Scrotom W/doppler  Result Date: 05/20/2017 CLINICAL DATA:  Scrotal pain, swelling and erythema. EXAM: SCROTAL ULTRASOUND DOPPLER ULTRASOUND OF THE TESTICLES TECHNIQUE: Complete ultrasound examination of the testicles, epididymis, and other scrotal structures was performed. Color and spectral Doppler ultrasound were also utilized to evaluate blood flow to the testicles. COMPARISON:  None. FINDINGS: Right testicle Measurements: 4.5 x 2.5 x 2.3 cm. No mass or microlithiasis visualized. Left testicle Measurements: 4.2 x 2.6 x 2.5 cm. No mass or microlithiasis visualized. Right epididymis:  Normal in size and appearance. Left epididymis:  Normal in size and appearance. Hydrocele: Oval cystic and solid appearing area along the lateral aspect of  the left testicle measuring 3.4 x 1.6 x 1.5 cm. No internal blood flow seen within this area with color Doppler. Varicocele:  None visualized. Other: Diffuse scrotal skin thickening. Pulsed Doppler interrogation of both testes demonstrates normal low resistance arterial and venous waveforms bilaterally. IMPRESSION: 1. 3.4 x 1.6 x 1.5 cm probable complicated hydrocele lateral to the left testicle. This could represent a hydrocele complicated by infection or hemorrhage. 2. Diffuse scrotal skin thickening compatible with diffuse scrotal cellulitis. 3. Normal appearing testicles and epididymides. Electronically Signed  By: Beckie Salts M.D.   On: 05/20/2017 19:10     Assessment/ Plan:  1.  Urinary retention-  Continue Flomax.  In addition, will add finasteride.  Outpatient voiding trial, will arranged.  2.  UTI/scrotal cellulitis/ complex left hydrocele- currently on ceftriaxone with improving leukocytosis.  Urine culture growing 100,000 colonies of pansensitive E. coli. Recommend at least 10-day course of antibiotics, okay to transition to oral.  3.  Acute renal failure -creatinine improving with urinary decompression.  Continue to trend creatinine.  Avoid nephrotoxic agents.   Overall improved, urology will sign off.  Will arrange for outpatient voiding trial next week.  Continue Flomax and finasteride as well as oral antibiotic on discharge.   LOS: 2 days    Vanna Scotland 05/22/2017

## 2017-05-22 NOTE — Clinical Social Work Note (Signed)
Clinical Social Work Assessment  Patient Details  Name: Terry Vaughn MRN: [id number] Date of Birth: 07/20/1933  Date of referral:  05/22/17               Reason for consult:  Discharge Planning                Permission sought to share information with:  Facility Medical sales representativeContact Representative, Family Supports Permission granted to share information::  Yes, Verbal Permission Granted  Name::        Agency::     Relationship::     Contact Information:     Housing/Transportation Living arrangements for the past 2 months:  Single Family Home, Skilled Nursing Facility Source of Information:  Patient, Spouse Patient Interpreter Needed:  None Criminal Activity/Legal Involvement Pertinent to Current Situation/Hospitalization:  No - Comment as needed Significant Relationships:  Spouse Lives with:  Facility Resident Do you feel safe going back to the place where you live?  Yes Need for family participation in patient care:  Yes (Comment)  Care giving concerns:  [facility].   Social Worker assessment / plan:  MD is wanting to discharge patient today and CSW has contacted [facility]le [facility] and they are able to take patient back today. Patient and wife are in agreement and wish to transport via EMS. [facility]   Employment status:  Retired Database administratornsurance information:  Managed Medicare PT Recommendations:    Information / Referral to community resources:     Patient/Family's Response to care:  Patient and wife expressed appreciation for CSW assistance.  Patient/Family's Understanding of and Emotional Response to Diagnosis, Current Treatment, and Prognosis:  Patient is aware of discharge and in agreement.  Emotional Assessment Appearance:  Appears stated age Attitude/Demeanor/Rapport:  Self-Absorbed Affect (typically observed):  Irritable Orientation:  Oriented to Self, Oriented to Place, Oriented to  Time, Oriented to  Situation Alcohol / Substance use:  Not Applicable Psych involvement (Current and /or in the community):  No (Comment)  Discharge Needs  Concerns to be addressed:  Care Coordination Readmission within the last 30 days:  No Current discharge risk:  None Barriers to Discharge:  No Barriers Identified   York SpanielMonica Laurabelle Gorczyca, LCSW 05/22/2017, 12:49 PM

## 2017-05-22 NOTE — Care Management Important Message (Signed)
Important Message  Patient Details  Name: Barkley BoardsFrank E Mccuen MRN: [id number] Date of Birth: 08/22/1932   Medicare Important Message Given:  Yes    Chapman FitchBOWEN, Jordanny Waddington T, RN 05/22/2017, 11:48 AM

## 2017-05-22 NOTE — Telephone Encounter (Signed)
-----   Message from Vanna ScotlandAshley Brandon, MD sent at 05/22/2017  8:35 AM EDT ----- Regarding: f/u plan This patient needs nurse visit voiding trial next week.  I would like to see him in 2-3 weeks in my clinic.  Vanna ScotlandAshley Brandon, MD

## 2017-05-22 NOTE — Discharge Summary (Signed)
[facility] [place] [facility]   PATIENT NAME: Terry Vaughn    MR#:  [id number]  DATE OF BIRTH:  1933/01/11  DATE OF ADMISSION:  05/20/2017 ADMITTING PHYSICIAN: Evelena Asa Salary, MD  DATE OF DISCHARGE: 05/22/2017  PRIMARY CARE PHYSICIAN: Allegra Grana, FNP    ADMISSION DIAGNOSIS:  Infected hydrocele [N43.1] Hyperkalemia [E87.5] Urinary retention [R33.9] Swelling [R60.9] Erythema [L53.9] Pyelonephritis [N12] Acute renal insufficiency [N28.9] Sepsis (HCC) [A41.9] Sepsis, due to unspecified organism (HCC) [A41.9]  DISCHARGE DIAGNOSIS:  Active Problems:   Renal failure   Acute renal insufficiency   Infected hydrocele   Sepsis (HCC)   SECONDARY DIAGNOSIS:   Past Medical History:  Diagnosis Date  . Bone spur 1988   left heel  . Bursitis 1987   right shoulder  . Cellulitis 1986   right leg  . Shingles 1959    HOSPITAL COURSE:   83 year old male with past medical history of BPH, osteoarthritis, shingles, who presented to the hospital due to abdominal pain and noted to have urinary retention and also noted to have sepsis secondary to UTI.  1. Sepsis-this was secondary to a urinary tract infection -Patient's urine cultures were positive for E. coli which is pansensitive.  While in the hospital patient was treated with IV ceftriaxone and not being discharged on oral Ceftin for additional 10 days.  His blood cultures are negative, and he has been afebrile and hemodynamically stable over the past 24-48 hours.  2. Urinary tract infection- patient was treated with IV ceftriaxone, now being discharged on oral Ceftin.  Urine cultures are positive for E. coli which is pansensitive.  3. Urinary retention-secondary to BPH combined with underlying UTI.  Patient had a Foley catheter placed, urology consult was obtained.  Patient did not require any further surgical intervention.  His creatinine and his urinary retention has improved post Foley catheter  placement.  He was started on Flomax, finasteride which he is presently being discharged on. -He will get a voiding trial as an outpatient with urology.    4. Acute on chronic kidney injury-secondary to urinary retention. -As patient's urinary retention has resolved his creatinine has also improved and normalized.  This can be further followed as an outpatient.  5. Leukocytosis-secondary to the urinary tract infection and sepsis.  -Improved and resolved with IV antibiotic therapy.  6. Status post recent left hip replacement-patient was at short-term rehabilitation getting physical therapy. -Patient is being discharged back to his skilled nursing facility for ongoing care and rehab post hip surgery.  DISCHARGE CONDITIONS:   Stable  CONSULTS OBTAINED:  Treatment Team:  Vanna Scotland, MD Hildred Laser, MD  DRUG ALLERGIES:   Allergies  Allergen Reactions  . Codeine Other (See Comments)    Alters mental state    DISCHARGE MEDICATIONS:   Allergies as of 05/22/2017      Reactions   Codeine Other (See Comments)   Alters mental state      Medication List    TAKE these medications   acetaminophen 500 MG tablet Commonly known as:  TYLENOL Take 500 mg by mouth every 6 (six) hours as needed. Maximum 3 grams in 24 hours   alum & mag hydroxide-simeth 400-400-40 MG/5ML suspension Commonly known as:  MAALOX PLUS Take 30 mLs by mouth every 4 (four) hours as needed for indigestion.   aspirin 81 MG chewable tablet Chew 81 mg by mouth daily.   bisacodyl 10 MG/30ML Enem Commonly known as:  FLEET Place 10 mg rectally  2 (two) times daily as needed.   cefUROXime 250 MG tablet Commonly known as:  CEFTIN Take 1 tablet (250 mg total) by mouth 2 (two) times daily with a meal.   CENTRUM SILVER ULTRA MENS Tabs Take 1 each by mouth daily.   diphenhydrAMINE 25 mg capsule Commonly known as:  BENADRYL Take 25 mg by mouth as needed for itching.   docusate sodium 100 MG  capsule Commonly known as:  COLACE Take 200 mg by mouth daily.   Ergocalciferol 400 units Tabs Take 400 Units by mouth daily.   finasteride 5 MG tablet Commonly known as:  PROSCAR Take 1 tablet (5 mg total) by mouth daily.   meloxicam 15 MG tablet Commonly known as:  MOBIC Take 15 mg by mouth daily.   omeprazole 20 MG capsule Commonly known as:  PRILOSEC Take 1 capsule (20 mg total) by mouth daily.   phenazopyridine 100 MG tablet Commonly known as:  PYRIDIUM Take 1 tablet (100 mg total) by mouth 3 (three) times daily.   sennosides-docusate sodium 8.6-50 MG tablet Commonly known as:  SENOKOT-S Take 2 tablets by mouth 2 (two) times daily.   tamsulosin 0.4 MG Caps capsule Commonly known as:  FLOMAX Take 0.4 mg by mouth at bedtime.         DISCHARGE INSTRUCTIONS:   DIET:  Regular diet  DISCHARGE CONDITION:  Stable  ACTIVITY:  Activity as tolerated  OXYGEN:  Home Oxygen: No.   Oxygen Delivery: room air  DISCHARGE LOCATION:  nursing home   If you experience worsening of your admission symptoms, develop shortness of breath, life threatening emergency, suicidal or homicidal thoughts you must seek medical attention immediately by calling 911 or calling your MD immediately  if symptoms less severe.  You Must read complete instructions/literature along with all the possible adverse reactions/side effects for all the Medicines you take and that have been prescribed to you. Take any new Medicines after you have completely understood and accpet all the possible adverse reactions/side effects.   Please note  You were cared for by a hospitalist during your hospital stay. If you have any questions about your discharge medications or the care you received while you were in the hospital after you are discharged, you can call the unit and asked to speak with the hospitalist on call if the hospitalist that took care of you is not available. Once you are discharged, your  primary care physician will handle any further medical issues. Please note that NO REFILLS for any discharge medications will be authorized once you are discharged, as it is imperative that you return to your primary care physician (or establish a relationship with a primary care physician if you do not have one) for your aftercare needs so that they can reassess your need for medications and monitor your lab values.     Today   No acute events overnight.  No hematuria.  No abdominal pain.  Creatinine has improved.  Remains afebrile and hemodynamically stable.  Seen by urology and will be discharged with a Foley catheter and a voiding trial as an outpatient.  VITAL SIGNS:  Blood pressure (!) 147/56, pulse 90, temperature 98.2 F (36.8 C), temperature source Oral, resp. rate 20, height 6\' 1"  (1.854 m), weight 118.4 kg (261 lb), SpO2 99 %.  I/O:    Intake/Output Summary (Last 24 hours) at 05/22/17 1329 Last data filed at 05/22/17 1135  Gross per 24 hour  Intake  2241 ml  Output             1975 ml  Net              266 ml    PHYSICAL EXAMINATION:   GENERAL:  83 y.o.-year-old patient lying in bed in no acute distress.  EYES: Pupils equal, round, reactive to light and accommodation. No scleral icterus. Extraocular muscles intact.  HEENT: Head atraumatic, normocephalic. Oropharynx and nasopharynx clear.  NECK:  Supple, no jugular venous distention. No thyroid enlargement, no tenderness.  LUNGS: Normal breath sounds bilaterally, no wheezing, rales, rhonchi. No use of accessory muscles of respiration.  CARDIOVASCULAR: S1, S2 normal. No murmurs, rubs, or gallops.  ABDOMEN: Soft, nontender, nondistended. Bowel sounds present. No organomegaly or mass.  EXTREMITIES: No cyanosis, clubbing, +1-2 edema b/l.    NEUROLOGIC: Cranial nerves II through XII are intact. No focal Motor or sensory deficits b/l.   PSYCHIATRIC: The patient is alert and oriented x 3.  SKIN: No obvious rash,  lesion, or ulcer.   GU - + scrotal edema and foley cath in place with yellow urine draining.   DATA REVIEW:   CBC  Recent Labs Lab 05/22/17 0413  WBC 14.9*  HGB 8.5*  HCT 25.0*  PLT 467*    Chemistries   Recent Labs Lab 05/22/17 0413  NA 136  K 3.9  CL 106  CO2 27  GLUCOSE 100*  BUN 36*  CREATININE 1.31*  CALCIUM 7.2*    Cardiac Enzymes No results for input(s): TROPONINI in the last 168 hours.  Microbiology Results  Results for orders placed or performed during the hospital encounter of 05/20/17  Urine Culture     Status: Abnormal (Preliminary result)   Collection Time: 05/20/17  6:25 PM  Result Value Ref Range Status   Specimen Description URINE, RANDOM  Final   Special Requests NONE  Final   Culture >=100,000 COLONIES/mL ESCHERICHIA COLI (A)  Final   Report Status PENDING  Incomplete  Blood culture (routine x 2)     Status: None (Preliminary result)   Collection Time: 05/20/17  8:18 PM  Result Value Ref Range Status   Specimen Description BLOOD RFOA  Final   Special Requests   Final    BOTTLES DRAWN AEROBIC AND ANAEROBIC Blood Culture adequate volume   Culture NO GROWTH 2 DAYS  Final   Report Status PENDING  Incomplete  Blood culture (routine x 2)     Status: None (Preliminary result)   Collection Time: 05/20/17  8:19 PM  Result Value Ref Range Status   Specimen Description BLOOD LFOA  Final   Special Requests   Final    BOTTLES DRAWN AEROBIC AND ANAEROBIC Blood Culture adequate volume   Culture NO GROWTH 2 DAYS  Final   Report Status PENDING  Incomplete  MRSA PCR Screening     Status: None   Collection Time: 05/21/17  6:49 AM  Result Value Ref Range Status   MRSA by PCR NEGATIVE NEGATIVE Final    Comment:        The GeneXpert MRSA Assay (FDA approved for NASAL specimens only), is one component of a comprehensive MRSA colonization surveillance program. It is not intended to diagnose MRSA infection nor to guide or monitor treatment for MRSA  infections.     RADIOLOGY:  Dg Chest Port 1 View  Result Date: 05/20/2017 CLINICAL DATA:  Sepsis EXAM: PORTABLE CHEST 1 VIEW COMPARISON:  None. FINDINGS: The heart size and mediastinal contours are within  normal limits. Both lungs are clear. The visualized skeletal structures are unremarkable. IMPRESSION: No active disease. Electronically Signed   By: Alcide Clever M.D.   On: 05/20/2017 21:05   [place] Scrotom W/doppler  Result Date: 05/20/2017 CLINICAL DATA:  Scrotal pain, swelling and erythema. EXAM: SCROTAL ULTRASOUND DOPPLER ULTRASOUND OF THE TESTICLES TECHNIQUE: Complete ultrasound examination of the testicles, epididymis, and other scrotal structures was performed. Color and spectral Doppler ultrasound were also utilized to evaluate blood flow to the testicles. COMPARISON:  None. FINDINGS: Right testicle Measurements: 4.5 x 2.5 x 2.3 cm. No mass or microlithiasis visualized. Left testicle Measurements: 4.2 x 2.6 x 2.5 cm. No mass or microlithiasis visualized. Right epididymis:  Normal in size and appearance. Left epididymis:  Normal in size and appearance. Hydrocele: Oval cystic and solid appearing area along the lateral aspect of the left testicle measuring 3.4 x 1.6 x 1.5 cm. No internal blood flow seen within this area with color Doppler. Varicocele:  None visualized. Other: Diffuse scrotal skin thickening. Pulsed Doppler interrogation of both testes demonstrates normal low resistance arterial and venous waveforms bilaterally. IMPRESSION: 1. 3.4 x 1.6 x 1.5 cm probable complicated hydrocele lateral to the left testicle. This could represent a hydrocele complicated by infection or hemorrhage. 2. Diffuse scrotal skin thickening compatible with diffuse scrotal cellulitis. 3. Normal appearing testicles and epididymides. Electronically Signed   By: Beckie Salts M.D.   On: 05/20/2017 19:10      Management plans discussed with the patient, family and they are in agreement.  CODE STATUS:     Code  Status Orders        Start     Ordered   05/20/17 2151  Limited resuscitation (code)  Continuous    Question Answer Comment  In the event of cardiac or respiratory ARREST: Initiate Code Blue, Call Rapid Response Yes   In the event of cardiac or respiratory ARREST: Perform CPR Yes   In the event of cardiac or respiratory ARREST: Perform Intubation/Mechanical Ventilation No   In the event of cardiac or respiratory ARREST: Use NIPPV/BiPAp only if indicated Yes   In the event of cardiac or respiratory ARREST: Administer ACLS medications if indicated Yes   In the event of cardiac or respiratory ARREST: Perform Defibrillation or Cardioversion if indicated Yes      05/20/17 2150    Code Status History    Date Active Date Inactive Code Status Order ID Comments User Context   This patient has a current code status but no historical code status.    Advance Directive Documentation     Most Recent Value  Type of Advance Directive  Healthcare Power of Attorney, Living will  Pre-existing out of facility DNR order (yellow form or pink MOST form)  -  "MOST" Form in Place?  -      TOTAL TIME TAKING CARE OF THIS PATIENT: 40 minutes.    Houston Siren M.D on 05/22/2017 at 1:29 PM  Between 7am to 6pm - Pager - [phone]  After 6pm go to [email or web address] - Scientist, research (life sciences) [facility]  Office  [phone]  CC: Primary care physician; Allegra Grana, FNP

## 2017-05-22 NOTE — [place] FL2 (Signed)
[place] MEDICAID FL2 LEVEL OF CARE SCREENING TOOL     IDENTIFICATION  Patient Name: Terry Vaughn Birthdate: 05/04/1933 Sex: male Admission Date (Current Location): 05/20/2017  [place]   Facility and Address:  [facility], [address], [place], [place] [address]      Provider Number: [id number]  Attending Physician Name and Address:  Houston SirenSainani, Vivek J, MD  Relative Name and Phone Number:       Current Level of Care: Hospital Recommended Level of Care: Skilled Nursing Facility Prior Approval Number:    Date Approved/Denied:   PASRR Number: [id number]  Discharge Plan: SNF    Current Diagnoses: Patient Active Problem List   Diagnosis Date Noted  . Acute renal insufficiency   . Infected hydrocele   . Sepsis (HCC)   . Renal failure 05/20/2017  . Left hip pain 04/15/2017  . Lower limb ulcer, heel or midfoot, right, limited to breakdown of skin (HCC) 05/20/2016  . Varicose veins of leg with swelling, right 05/20/2016  . Leg swelling 05/08/2016  . Osteoarthritis of knee 05/08/2016  . Achilles tendinitis 03/06/2016  . Pain in left foot 03/06/2016  . Cellulitis of toe of right foot 12/14/2015  . Toe ulcer, right (HCC) 12/14/2015  . Shingles 02/14/2013  . Encounter to establish care 01/28/2012  . Elevated bilirubin 01/28/2012  . Hypertriglyceridemia 01/28/2012  . Hypertension 12/12/2011  . Obesity 12/12/2011    Orientation RESPIRATION BLADDER Height & Weight     Self, Time, Situation, Place  Normal Continent Weight: 261 lb (118.4 kg) Height:  6\' 1"  (185.4 cm)  BEHAVIORAL SYMPTOMS/MOOD NEUROLOGICAL BOWEL NUTRITION STATUS   (none)  (none) Continent Diet (heart healthy)  AMBULATORY STATUS COMMUNICATION OF NEEDS Skin   Extensive Assist Verbally Normal                       Personal Care Assistance Level of Assistance  Bathing, Dressing Bathing Assistance: Limited assistance   Dressing Assistance:  Limited assistance     Functional Limitations Info  Hearing   Hearing Info: Impaired      SPECIAL CARE FACTORS FREQUENCY  PT (By licensed PT)                    Contractures Contractures Info: Not present    Additional Factors Info  Code Status Code Status Info: partial             Current Medications (05/22/2017):  This is the current hospital active medication list Current Facility-Administered Medications  Medication Dose Route Frequency Provider Last Rate Last Dose  . 0.9 %  sodium chloride infusion   Intravenous Once Don PerkingVeronese, [place], MD      . 0.9 %  sodium chloride infusion   Intravenous Continuous Salary, Evelena AsaMontell D, MD   Stopped at 05/22/17 1237  . acetaminophen (TYLENOL) tablet 650 mg  650 mg Oral Q6H PRN Altamese DillingVachhani, Vaibhavkumar, MD   650 mg at 05/22/17 1044  . alum & mag hydroxide-simeth (MAALOX PLUS) 400-400-40 MG/5ML suspension 30 mL  30 mL Oral Q4H PRN Salary, Montell D, MD      . aspirin chewable tablet 81 mg  81 mg Oral Daily Salary, Montell D, MD   81 mg at 05/22/17 1006  . bisacodyl (DULCOLAX) suppository 10 mg  10 mg Rectal BID PRN Salary, Montell D, MD      . cefTRIAXone (ROCEPHIN) 1 g in dextrose 5 % 50 mL IVPB  1 g Intravenous Q24H Cindi Carbon, [place]   Stopped at 05/21/17 7846  . cholecalciferol (VITAMIN D) tablet 400 Units  400 Units Oral Daily Salary, Jetty Duhamel D, MD   400 Units at 05/22/17 1005  . diphenhydrAMINE (BENADRYL) capsule 25 mg  25 mg Oral PRN Salary, Montell D, MD      . docusate sodium (COLACE) capsule 200 mg  200 mg Oral Daily Salary, Montell D, MD   200 mg at 05/22/17 1005  . fentaNYL (SUBLIMAZE) injection 50 mcg  50 mcg Intravenous Once Don Perking, [place], MD      . finasteride (PROSCAR) tablet 5 mg  5 mg Oral Daily Vanna Scotland, MD   5 mg at 05/22/17 1005  . heparin injection 5,000 Units  5,000 Units Subcutaneous Q8H Salary, Montell D, MD   5,000 Units at 05/22/17 0540  . HYDROcodone-acetaminophen (NORCO/VICODIN) 5-325 MG per  tablet 1-2 tablet  1-2 tablet Oral Q4H PRN Salary, Montell D, MD      . multivitamin with minerals tablet 1 tablet  1 tablet Oral Daily Salary, Montell D, MD   1 tablet at 05/22/17 1005  . ondansetron (ZOFRAN) tablet 4 mg  4 mg Oral Q6H PRN Salary, Montell D, MD       Or  . ondansetron (ZOFRAN) injection 4 mg  4 mg Intravenous Q6H PRN Salary, Montell D, MD      . pantoprazole (PROTONIX) EC tablet 40 mg  40 mg Oral Daily Salary, Montell D, MD   40 mg at 05/22/17 1006  . senna-docusate (Senokot-S) tablet 2 tablet  2 tablet Oral BID Angelina Ok D, MD   2 tablet at 05/22/17 1006  . tamsulosin (FLOMAX) capsule 0.4 mg  0.4 mg Oral QHS Salary, Montell D, MD   0.4 mg at 05/21/17 2124  . temazepam (RESTORIL) capsule 7.5 mg  7.5 mg Oral QHS PRN Salary, Evelena Asa, MD         Discharge Medications: Please see discharge summary for a list of discharge medications.  Relevant Imaging Results:  Relevant Lab Results:   Additional Information    York Spaniel, LCSW

## 2017-05-22 NOTE — Progress Notes (Signed)
[facility]. VSS. IV removed. Discharge instructions given to pt and wife at bedside. EMS called and will transport.

## 2017-05-23 LAB — URINE CULTURE: Culture: >=100000 — AB

## 2017-05-25 ENCOUNTER — Other Ambulatory Visit
Admission: RE | Admit: 2017-05-25 | Discharge: 2017-05-25 | Disposition: A | Discharge status: Home / Self Care | Payer: Medicare Other | Source: Ambulatory Visit | Attending: Internal Medicine | Admitting: Internal Medicine

## 2017-05-25 ENCOUNTER — Other Ambulatory Visit
Admission: RE | Admit: 2017-05-25 | Discharge: 2017-05-25 | Disposition: A | Discharge status: Home / Self Care | Payer: Medicare Other | Source: Ambulatory Visit | Attending: Gerontology | Admitting: Gerontology

## 2017-05-25 DIAGNOSIS — R198 Other specified symptoms and signs involving the digestive system and abdomen: Secondary | ICD-10-CM | POA: Diagnosis not present

## 2017-05-25 DIAGNOSIS — R197 Diarrhea, unspecified: Secondary | ICD-10-CM | POA: Diagnosis present

## 2017-05-25 DIAGNOSIS — N431 Infected hydrocele: Secondary | ICD-10-CM | POA: Insufficient documentation

## 2017-05-25 LAB — CBC WITH DIFFERENTIAL/PLATELET
Basophils Absolute: 0 10*3/uL (ref 0–0.1)
Basophils Relative: 0 %
Eosinophils Absolute: 0.2 10*3/uL (ref 0–0.7)
Eosinophils Relative: 1 %
HCT: 28.8 % — ABNORMAL LOW (ref 40.0–52.0)
Hemoglobin: 9.6 g/dL — ABNORMAL LOW (ref 13.0–18.0)
Lymphocytes Relative: 6 %
Lymphs Abs: 1.2 10*3/uL (ref 1.0–3.6)
MCH: 30.9 pg (ref 26.0–34.0)
MCHC: 33.3 g/dL (ref 32.0–36.0)
MCV: 92.7 fL (ref 80.0–100.0)
Monocytes Absolute: 1.4 10*3/uL — ABNORMAL HIGH (ref 0.2–1.0)
Monocytes Relative: 7 %
Neutro Abs: 17.3 10*3/uL — ABNORMAL HIGH (ref 1.4–6.5)
Neutrophils Relative %: 86 %
Platelets: 565 10*3/uL — ABNORMAL HIGH (ref 150–440)
RBC: 3.11 MIL/uL — ABNORMAL LOW (ref 4.40–5.90)
RDW: 15.2 % — ABNORMAL HIGH (ref 11.5–14.5)
WBC: 20.1 10*3/uL — ABNORMAL HIGH (ref 3.8–10.6)

## 2017-05-25 LAB — COMPREHENSIVE METABOLIC PANEL
ALT: 73 U/L — ABNORMAL HIGH (ref 17–63)
AST: 76 U/L — ABNORMAL HIGH (ref 15–41)
Albumin: 1.8 g/dL — ABNORMAL LOW (ref 3.5–5.0)
Alkaline Phosphatase: 175 U/L — ABNORMAL HIGH (ref 38–126)
Anion gap: 8 (ref 5–15)
BUN: 15 mg/dL (ref 6–20)
CO2: 26 mmol/L (ref 22–32)
Calcium: 7.5 mg/dL — ABNORMAL LOW (ref 8.9–10.3)
Chloride: 99 mmol/L — ABNORMAL LOW (ref 101–111)
Creatinine, Ser: 0.95 mg/dL (ref 0.61–1.24)
GFR calc Af Amer: >60 mL/min (ref >60)
GFR calc non Af Amer: >60 mL/min (ref >60)
Glucose, Bld: 102 mg/dL — ABNORMAL HIGH (ref 65–99)
Potassium: 3.7 mmol/L (ref 3.5–5.1)
Sodium: 133 mmol/L — ABNORMAL LOW (ref 135–145)
Total Bilirubin: 0.8 mg/dL (ref 0.3–1.2)
Total Protein: 4.6 g/dL — ABNORMAL LOW (ref 6.5–8.1)

## 2017-05-25 LAB — C DIFFICILE QUICK SCREEN W PCR REFLEX
C Diff antigen: POSITIVE — AB
C Diff toxin: NEGATIVE

## 2017-05-25 LAB — CULTURE, BLOOD (ROUTINE X 2)
Culture: NO GROWTH
Culture: NO GROWTH
Special Requests: ADEQUATE
Special Requests: ADEQUATE

## 2017-05-25 LAB — CLOSTRIDIUM DIFFICILE BY PCR: Toxigenic C. Difficile by PCR: POSITIVE — AB

## 2017-05-26 NOTE — Telephone Encounter (Signed)
I got a call today from [place] at the home that the patient lives at and they informed me that he will not be able to come in on Friday because he has CI-DFF. They said they are able to do his Void and Trial there and can place the catheter back if he is unable to urinate on Friday. They will keep us informed if he is unable to make his follow up app with you on the 16th.   Marcelino DusterMichelle

## 2017-05-27 DIAGNOSIS — M1612 Unilateral primary osteoarthritis, left hip: Secondary | ICD-10-CM | POA: Insufficient documentation

## 2017-05-28 ENCOUNTER — Encounter
Admission: RE | Admit: 2017-05-28 | Discharge: 2017-05-28 | Disposition: A | Discharge status: Home / Self Care | Payer: Medicare Other | Source: Ambulatory Visit | Attending: Internal Medicine | Admitting: Internal Medicine

## 2017-05-28 ENCOUNTER — Other Ambulatory Visit
Admission: RE | Admit: 2017-05-28 | Discharge: 2017-05-28 | Disposition: A | Discharge status: Home / Self Care | Payer: Medicare Other | Source: Ambulatory Visit | Attending: Gerontology | Admitting: Gerontology

## 2017-05-28 DIAGNOSIS — N3943 Post-void dribbling: Secondary | ICD-10-CM | POA: Insufficient documentation

## 2017-05-28 DIAGNOSIS — R3915 Urgency of urination: Secondary | ICD-10-CM | POA: Insufficient documentation

## 2017-05-28 DIAGNOSIS — R600 Localized edema: Secondary | ICD-10-CM | POA: Insufficient documentation

## 2017-05-28 LAB — URINALYSIS, COMPLETE (UACMP) WITH MICROSCOPIC
Bacteria, UA: NONE SEEN
Bilirubin Urine: NEGATIVE
Glucose, UA: NEGATIVE mg/dL
Ketones, ur: NEGATIVE mg/dL
Nitrite: NEGATIVE
Protein, ur: NEGATIVE mg/dL
Specific Gravity, Urine: 1.012 (ref 1.005–1.030)
Squamous Epithelial / LPF: NONE SEEN
pH: 7 (ref 5.0–8.0)

## 2017-05-29 ENCOUNTER — Ambulatory Visit: Payer: Self-pay

## 2017-05-30 LAB — URINE CULTURE: Culture: >=100000 — AB

## 2017-06-02 ENCOUNTER — Non-Acute Institutional Stay (SKILLED_NURSING_FACILITY): Payer: Medicare Other | Admitting: Gerontology

## 2017-06-02 DIAGNOSIS — R609 Edema, unspecified: Secondary | ICD-10-CM | POA: Diagnosis not present

## 2017-06-02 DIAGNOSIS — B3749 Other urogenital candidiasis: Secondary | ICD-10-CM | POA: Diagnosis not present

## 2017-06-03 ENCOUNTER — Encounter: Payer: Self-pay | Admitting: Gerontology

## 2017-06-03 NOTE — Progress Notes (Signed)
[facility]:   [facility] Room Number: 355P Place of Service:  SNF [phone]) Provider:  Lorenso Quarry, NP-C  [name], Lyn Records, FNP  Patient Care Team: Allegra Grana, FNP as PCP - General (Family Medicine)  Extended Emergency Contact Information Primary Emergency Contact: Dayon, Witt Address: [address]          [place], [place] [address] Darden [place] of [place] Home Phone: [phone] Mobile Phone: [phone] Relation: Spouse Secondary Emergency Contact: Domenick Bookbinder [place] of [place] Home Phone: [phone] Relation: Son  Code Status:  FULL Goals of care: Advanced Directive information Advanced Directives 06/02/2017  Does Patient Have a Medical Advance Directive? No  Type of Advance Directive -  Does patient want to make changes to medical advance directive? No - Patient declined  Copy of Healthcare Power of Attorney in Chart? -     Chief Complaint  Patient presents with  . Acute Visit    Recheck Edema    HPI:  Pt is a 83 y.o. male seen today for an acute visit for edema.  Patient became concerned when he felt like he "was laying on a pillow under his side" last night.  Patient realized it was distal flank edema-bilateral.  Worse on the left side.  The edema is taut and pitting.  No redness or warmth.  The areas are uncomfortable to the patient, but not overly painful.  Patient denies chest pain or shortness of breath.  Patient does continue to have diffuse edema in the left leg from ankle to hip.  Bilateral calves and thighs are taut, but pliable.  Nontender.  Negative Homans sign.  Bilateral pedal pulses intact, equal and strong.  Foley catheter remains in place.  Draining clear, yellow urine. UA obtained for confusion 3 days ago showed >100,000 colonies yeast in the urine. Pt continues on Cipro for UTI and oral vancomycin for C-diff without issue. Negative abdominal pain.  Vital signs stable.  No other complaints.   Past  Medical History:  Diagnosis Date  . Bone spur 1988   left heel  . Bursitis 1987   right shoulder  . Cellulitis 1986   right leg  . Shingles 1959   Past Surgical History:  Procedure Laterality Date  . APPENDECTOMY  1942  . CHOLECYSTECTOMY  1966  . EYE SURGERY  2015   catarcts extracted  . MELANOMA EXCISION  04.2010   mole removal right arm  . TONSILLECTOMY AND ADENOIDECTOMY  1944  . TOTAL HIP ARTHROPLASTY  1999   right hip    Allergies  Allergen Reactions  . Codeine Other (See Comments)    Alters mental state    Allergies as of 06/02/2017      Reactions   Codeine Other (See Comments)   Alters mental state      Medication List        Accurate as of 06/02/17 11:59 PM. Always use your most recent med list.          acetaminophen 500 MG tablet Commonly known as:  TYLENOL Take 500 mg by mouth every 6 (six) hours as needed. Maximum 3 grams in 24 hours   alum & mag hydroxide-simeth 400-400-40 MG/5ML suspension Commonly known as:  MAALOX PLUS Take 30 mLs by mouth every 4 (four) hours as needed for indigestion.   aspirin 81 MG chewable tablet Chew 81 mg by mouth daily.   bisacodyl 10 MG/30ML Enem Commonly known as:  FLEET Place 10 mg rectally 2 (two)  times daily as needed.   CENTRUM SILVER ULTRA MENS Tabs Take 1 each by mouth daily.   ciprofloxacin 500 MG tablet Commonly known as:  CIPRO Take 500 mg 2 (two) times daily by mouth.   DERMACLOUD Crea Apply 1 application daily topically.   ENSURE ENLIVE PO Take 1 Bottle 2 (two) times daily between meals by mouth.   Ergocalciferol 400 units Tabs Take 400 Units by mouth daily.   feeding supplement (PRO-STAT SUGAR FREE 64) Liqd Take 30 mLs 2 (two) times daily between meals by mouth.   finasteride 5 MG tablet Commonly known as:  PROSCAR Take 1 tablet (5 mg total) by mouth daily.   fluconazole 100 MG tablet Commonly known as:  DIFLUCAN Take 100 mg daily by mouth.   omeprazole 20 MG capsule Commonly  known as:  PRILOSEC Take 1 capsule (20 mg total) by mouth daily.   sennosides-docusate sodium 8.6-50 MG tablet Commonly known as:  SENOKOT-S Take 2 tablets by mouth 2 (two) times daily.   tamsulosin 0.4 MG Caps capsule Commonly known as:  FLOMAX Take 0.4 mg by mouth at bedtime.       Review of Systems  Constitutional: Negative for activity change, appetite change, chills, diaphoresis and fever.  HENT: Negative for congestion, sneezing, sore throat, trouble swallowing and voice change.   Eyes: Negative for pain, redness and visual disturbance.  Respiratory: Negative for apnea, cough, choking, chest tightness, shortness of breath and wheezing.   Cardiovascular: Positive for leg swelling. Negative for chest pain and palpitations.  Gastrointestinal: Negative for abdominal distention, abdominal pain, constipation, diarrhea and nausea.  Genitourinary: Negative for difficulty urinating, dysuria, frequency and urgency.  Musculoskeletal: Positive for arthralgias (typical arthritis) and joint swelling. Negative for back pain, gait problem and myalgias.  Skin: Positive for wound. Negative for color change, pallor and rash.  Neurological: Negative for dizziness, tremors, syncope, speech difficulty, weakness, numbness and headaches.  Psychiatric/Behavioral: Negative for agitation and behavioral problems.  All other systems reviewed and are negative.   Immunization History  Administered Date(s) Administered  . Hepatitis B 07/06/1992  . Influenza Nasal 03/29/2011  . Influenza Split 05/02/2012, 05/11/2014  . Influenza Whole 05/28/2008  . Influenza, High Dose Seasonal PF 04/15/2017  . Influenza-Unspecified 05/02/2015, 05/06/2016  . Pneumococcal Conjugate-13 03/24/2014  . Pneumococcal Polysaccharide-23 05/28/2000  . Td 05/28/2008  . Tdap 12/27/2010  . Zoster 05/29/2007   Pertinent  Health Maintenance Due  Topic Date Due  . INFLUENZA VACCINE  Completed  . PNA vac Low Risk Adult  Completed     Fall Risk  11/27/2016 05/08/2016 11/28/2015 03/24/2014 03/22/2013  Falls in the past year? No No No No No   Functional Status Survey:    Vitals:   06/02/17 1253  BP: (!) 151/70  Pulse: 88  Resp: 20  Temp: 98.2 F (36.8 C)  TempSrc: Oral  SpO2: 98%  Weight: 255 lb 9.6 oz (115.9 kg)  Height: 6\' 1"  (1.854 m)   Body mass index is 33.72 kg/m. Physical Exam  Constitutional: He is oriented to person, place, and time. Vital signs are normal. He appears well-developed and well-nourished. He is active and cooperative. He does not appear ill. No distress.  HENT:  Head: Normocephalic and atraumatic.  Mouth/Throat: Uvula is midline, oropharynx is clear and moist and mucous membranes are normal. Mucous membranes are not pale, not dry and not cyanotic.  Eyes: Conjunctivae, EOM and lids are normal. Pupils are equal, round, and reactive to light.  Neck: Trachea normal, normal  range of motion and full passive range of motion without pain. Neck supple. No JVD present. No tracheal deviation, no edema and no erythema present. No thyromegaly present.  Cardiovascular: Normal rate, regular rhythm, normal heart sounds, intact distal pulses and normal pulses. Exam reveals no gallop, no distant heart sounds and no friction rub.  No murmur heard. Pulses:      Dorsalis pedis pulses are 2+ on the right side, and 2+ on the left side.  2+ pitting edema left lower extremity, B- flank edema L>R  Pulmonary/Chest: Effort normal and breath sounds normal. No accessory muscle usage. No respiratory distress. He has no decreased breath sounds. He has no wheezes. He has no rhonchi. He has no rales. He exhibits no tenderness.  Abdominal: Soft. Normal appearance and bowel sounds are normal. He exhibits distension. He exhibits no ascites. There is no tenderness.  Musculoskeletal: He exhibits no edema or tenderness.       Left hip: He exhibits decreased range of motion, decreased strength, swelling and laceration.  Expected  osteoarthritis, stiffness, right calf is soft, supple.  Negative Homans sign.  Left calf tight, edematous  Neurological: He is alert and oriented to person, place, and time. He has normal strength.  Skin: Skin is warm and dry. Laceration noted. He is not diaphoretic. No cyanosis. No pallor. Nails show no clubbing.  Psychiatric: He has a normal mood and affect. His speech is normal and behavior is normal. Judgment and thought content normal. Cognition and memory are normal.  Nursing note and vitals reviewed.   Labs reviewed: Recent Labs    05/21/17 0131 05/22/17 0413 05/25/17 1140  NA 134* 136 133*  K 4.7 3.9 3.7  CL 101 106 99*  CO2 25 27 26   GLUCOSE 93 100* 102*  BUN 47* 36* 15  CREATININE 2.74* 1.31* 0.95  CALCIUM 7.4* 7.2* 7.5*   Recent Labs    04/15/17 1432 05/25/17 1140  AST 29 76*  ALT 36 73*  ALKPHOS 55 175*  BILITOT 1.6* 0.8  PROT 6.7 4.6*  ALBUMIN 4.3 1.8*   Recent Labs    04/15/17 1432 05/20/17 1825 05/21/17 0131 05/22/17 0413 05/25/17 1140  WBC 8.2 23.9* 19.6* 14.9* 20.1*  NEUTROABS 5.2 22.8*  --   --  17.3*  HGB 14.4 10.6* 8.8* 8.5* 9.6*  HCT 42.0 31.2* 26.0* 25.0* 28.8*  MCV 97.4 93.0 92.7 93.4 92.7  PLT 298.0 653* 504* 467* 565*   Lab Results  Component Value Date   TSH 1.74 04/15/2017   Lab Results  Component Value Date   HGBA1C 5.1 04/15/2017   Lab Results  Component Value Date   CHOL 135 04/15/2017   HDL 48.50 04/15/2017   LDLCALC 69 04/15/2017   TRIG 86.0 04/15/2017   CHOLHDL 3 04/15/2017    Significant Diagnostic Results in last 30 days:  Dg Chest Port 1 View  Result Date: 05/20/2017 CLINICAL DATA:  Sepsis EXAM: PORTABLE CHEST 1 VIEW COMPARISON:  None. FINDINGS: The heart size and mediastinal contours are within normal limits. Both lungs are clear. The visualized skeletal structures are unremarkable. IMPRESSION: No active disease. Electronically Signed   By: Alcide CleverMark  Lukens M.D.   On: 05/20/2017 21:05   [place] Scrotom  W/doppler  Result Date: 05/20/2017 CLINICAL DATA:  Scrotal pain, swelling and erythema. EXAM: SCROTAL ULTRASOUND DOPPLER ULTRASOUND OF THE TESTICLES TECHNIQUE: Complete ultrasound examination of the testicles, epididymis, and other scrotal structures was performed. Color and spectral Doppler ultrasound were also utilized to evaluate blood flow  to the testicles. COMPARISON:  None. FINDINGS: Right testicle Measurements: 4.5 x 2.5 x 2.3 cm. No mass or microlithiasis visualized. Left testicle Measurements: 4.2 x 2.6 x 2.5 cm. No mass or microlithiasis visualized. Right epididymis:  Normal in size and appearance. Left epididymis:  Normal in size and appearance. Hydrocele: Oval cystic and solid appearing area along the lateral aspect of the left testicle measuring 3.4 x 1.6 x 1.5 cm. No internal blood flow seen within this area with color Doppler. Varicocele:  None visualized. Other: Diffuse scrotal skin thickening. Pulsed Doppler interrogation of both testes demonstrates normal low resistance arterial and venous waveforms bilaterally. IMPRESSION: 1. 3.4 x 1.6 x 1.5 cm probable complicated hydrocele lateral to the left testicle. This could represent a hydrocele complicated by infection or hemorrhage. 2. Diffuse scrotal skin thickening compatible with diffuse scrotal cellulitis. 3. Normal appearing testicles and epididymides. Electronically Signed   By: Beckie SaltsSteven  Reid M.D.   On: 05/20/2017 19:10    Assessment/Plan 1.  Edema, unspecified type  Lasix 40 mg IM x1 now, then  Repeat Lasix 40 mg IM times 1 in the AM  Continue torsemide 5 mg p.o. Daily  2. Yeast UTI  Diflucan 100 mg po Q Day x 5 days  Family/ staff Communication:   Total Time:  Documentation:  Face to Face:  Family/Phone:   Labs/tests ordered:    Medication list reviewed and assessed for continued appropriateness.  Brynda RimShannon H. Oprah Camarena, NP-C [facility]iatrics [facility] [phone] [place], [place]  [id number] Cell Phone (Mon-Fri 8am-5pm):  [phone] On Call:  [phone] & follow prompts after 5pm & weekends Office Phone:  [phone] Office Fax:  [phone]

## 2017-06-05 ENCOUNTER — Other Ambulatory Visit
Admission: RE | Admit: 2017-06-05 | Discharge: 2017-06-05 | Disposition: A | Discharge status: Home / Self Care | Payer: Medicare Other | Source: Other Acute Inpatient Hospital | Attending: Internal Medicine | Admitting: Internal Medicine

## 2017-06-05 DIAGNOSIS — Z09 Encounter for follow-up examination after completed treatment for conditions other than malignant neoplasm: Secondary | ICD-10-CM | POA: Diagnosis present

## 2017-06-05 DIAGNOSIS — B9689 Other specified bacterial agents as the cause of diseases classified elsewhere: Secondary | ICD-10-CM | POA: Insufficient documentation

## 2017-06-05 LAB — C DIFFICILE QUICK SCREEN W PCR REFLEX
C Diff antigen: NEGATIVE
C Diff interpretation: NOT DETECTED
C Diff toxin: NEGATIVE

## 2017-06-09 ENCOUNTER — Encounter: Payer: Self-pay | Admitting: Gerontology

## 2017-06-09 ENCOUNTER — Non-Acute Institutional Stay (SKILLED_NURSING_FACILITY): Payer: Medicare Other | Admitting: Gerontology

## 2017-06-09 DIAGNOSIS — R609 Edema, unspecified: Secondary | ICD-10-CM | POA: Diagnosis not present

## 2017-06-09 DIAGNOSIS — M1612 Unilateral primary osteoarthritis, left hip: Secondary | ICD-10-CM

## 2017-06-09 DIAGNOSIS — A0472 Enterocolitis due to Clostridium difficile, not specified as recurrent: Secondary | ICD-10-CM

## 2017-06-09 DIAGNOSIS — Z96642 Presence of left artificial hip joint: Secondary | ICD-10-CM | POA: Diagnosis not present

## 2017-06-09 NOTE — Progress Notes (Signed)
Location:   [facility] Room Number: 096 Place of Service:  SNF [phone]) Provider:  Toni Arthurs, NP-C  [name], Yvetta Coder, FNP  Patient Care Team: Burnard Hawthorne, FNP as PCP - General (Family Medicine)  Extended Emergency Contact Information Primary Emergency Contact: Vega, [place] Address: [address]          [place], [place] [address] Johnnette Litter of [place] Phone: [phone] Mobile Phone: [phone] Relation: Spouse Secondary Emergency Contact: Elliot Gault [place] Phone: [phone] Relation: Son  Code Status:  FULL Goals of care: Advanced Directive information Advanced Directives 06/09/2017  Does Patient Have a Medical Advance Directive? No  Type of Advance Directive -  Does patient want to make changes to medical advance directive? -  Copy of [place] in Chart? -     Chief Complaint  Patient presents with  . Medical Management of Chronic Issues    Routine Visit    HPI:  Pt is a 83 y.o. male seen today for medical management of chronic diseases.  Patient was admitted to the facility for rehab following hospitalization at [facility] for left total hip replacement.  Patient has been somewhat participating in PT and OT.  Although, he does not seem to be putting forth a good deal of effort.  Patient reports pain is well controlled with current regimen.  Patient does continue to complain of the distal flank edema, despite Lasix injections x2 last week.  The edema is improved, but he is concerned because it is still there.  Patient requests to have more Lasix injections.  Educated patient on the need to be moving and participating with therapy more.  Educated patient that increased movement and mobility would help to improve the edema.  Patient reports his appetite is fair.  Foley catheter remains intact for urinary retention.  Patient continues to have intermittent loose stools.  He completed  treatment for C. difficile last week.  Recheck of stool was negative for the infection.  Otherwise, patient reports he is feeling well.  Vital signs stable.  No other complaints.   Past Medical History:  Diagnosis Date  . Bone spur 1988   left heel  . Bursitis 1987   right shoulder  . Cellulitis 1986   right leg  . Shingles 1959   Past Surgical History:  Procedure Laterality Date  . APPENDECTOMY  1942  . CHOLECYSTECTOMY  1966  . EYE SURGERY  2015   catarcts extracted  . MELANOMA EXCISION  04.2010   mole removal right arm  . TONSILLECTOMY AND ADENOIDECTOMY  1944  . TOTAL HIP ARTHROPLASTY  1999   right hip    Allergies  Allergen Reactions  . Codeine Other (See Comments)    Alters mental state    Allergies as of 06/09/2017      Reactions   Codeine Other (See Comments)   Alters mental state      Medication List        Accurate as of 06/09/17 11:52 AM. Always use your most recent med list.          acetaminophen 500 MG tablet Commonly known as:  TYLENOL Take 500 mg by mouth every 6 (six) hours as needed. Maximum 3 grams in 24 hours   alum & mag hydroxide-simeth 400-400-40 MG/5ML suspension Commonly known as:  MAALOX PLUS Take 30 mLs by mouth every 4 (four) hours as needed for indigestion.   aspirin 81 MG chewable tablet  Chew 81 mg by mouth daily.   bisacodyl 10 MG/30ML Enem Commonly known as:  FLEET Place 10 mg rectally 2 (two) times daily as needed.   CENTRUM SILVER ULTRA MENS Tabs Take 1 each by mouth daily.   DERMACLOUD Crea Apply 1 application daily topically.   ENSURE ENLIVE PO Take 1 Bottle 2 (two) times daily between meals by mouth.   Ergocalciferol 400 units Tabs Take 400 Units by mouth daily.   feeding supplement (PRO-STAT SUGAR FREE 64) Liqd Take 30 mLs 2 (two) times daily between meals by mouth.   finasteride 5 MG tablet Commonly known as:  PROSCAR Take 1 tablet (5 mg total) by mouth daily.   omeprazole 20 MG capsule Commonly  known as:  PRILOSEC Take 1 capsule (20 mg total) by mouth daily.   sennosides-docusate sodium 8.6-50 MG tablet Commonly known as:  SENOKOT-S Take 2 tablets by mouth 2 (two) times daily.   tamsulosin 0.4 MG Caps capsule Commonly known as:  FLOMAX Take 0.4 mg by mouth at bedtime.   torsemide 5 MG tablet Commonly known as:  DEMADEX Take 5 mg daily by mouth.       Review of Systems  Constitutional: Negative for activity change, appetite change, chills, diaphoresis and fever.  HENT: Negative for congestion, sneezing, sore throat, trouble swallowing and voice change.   Respiratory: Negative for apnea, cough, choking, chest tightness, shortness of breath and wheezing.   Cardiovascular: Positive for leg swelling. Negative for chest pain and palpitations.  Gastrointestinal: Negative for abdominal distention, abdominal pain, constipation, diarrhea and nausea.  Genitourinary: Negative for difficulty urinating, dysuria, frequency and urgency.  Musculoskeletal: Positive for arthralgias (typical arthritis) and joint swelling. Negative for back pain, gait problem and myalgias.  Skin: Positive for wound. Negative for color change, pallor and rash.  Neurological: Negative for dizziness, tremors, syncope, speech difficulty, weakness, numbness and headaches.  Psychiatric/Behavioral: Negative for agitation and behavioral problems.  All other systems reviewed and are negative.   Immunization History  Administered Date(s) Administered  . Hepatitis B 07/06/1992  . Influenza Nasal 03/29/2011  . Influenza Split 05/02/2012, 05/11/2014  . Influenza Whole 05/28/2008  . Influenza, High Dose Seasonal PF 04/15/2017  . Influenza-Unspecified 05/02/2015, 05/06/2016  . Pneumococcal Conjugate-13 03/24/2014  . Pneumococcal Polysaccharide-23 05/28/2000  . Td 05/28/2008  . Tdap 12/27/2010  . Zoster 05/29/2007   Pertinent  Health Maintenance Due  Topic Date Due  . INFLUENZA VACCINE  Completed  . PNA vac Low  Risk Adult  Completed   Fall Risk  11/27/2016 05/08/2016 11/28/2015 03/24/2014 03/22/2013  Falls in the past year? _0    Functional Status Survey:    Vitals:   06/09/17 1135  BP: 139/65  Pulse: 84  Resp: 16  Temp: 97.7 F (36.5 C)  TempSrc: Oral  SpO2: 96%  Weight: 233 lb 14.4 oz (106.1 kg)  Height: _1  (1.854 m)   Body mass index is 30.86 kg/m. Physical Exam  Constitutional: He is oriented to person, place, and time. Vital signs are normal. He appears well-developed and well-nourished. He is active and cooperative. He does not appear ill. No distress.  HENT:  Head: Normocephalic and atraumatic.  Mouth/Throat: Uvula is midline, oropharynx is clear and moist and mucous membranes are normal. Mucous membranes are not pale, not dry and not cyanotic.  Eyes: Conjunctivae, EOM and lids are normal. Pupils are equal, round, and reactive to light.  Neck: Trachea normal, normal range of motion and full passive range of  motion without pain. Neck supple. No JVD present. No tracheal deviation, no edema and no erythema present. No thyromegaly present.  Cardiovascular: Normal rate, regular rhythm, normal heart sounds, intact distal pulses and normal pulses. Exam reveals no gallop, no distant heart sounds and no friction rub.  No murmur heard. Pulses:      Dorsalis pedis pulses are 2+ on the right side, and 2+ on the left side.  2+ pitting edema left lower extremity, B- flank edema L>R  Pulmonary/Chest: Effort normal and breath sounds normal. No accessory muscle usage. No respiratory distress. He has no decreased breath sounds. He has no wheezes. He has no rhonchi. He has no rales. He exhibits no tenderness.  Abdominal: Soft. Normal appearance and bowel sounds are normal. He exhibits no distension and no ascites. There is no tenderness.  Musculoskeletal: He exhibits no edema or tenderness.       Left hip: He exhibits decreased range of motion, decreased strength, swelling and laceration.    Expected osteoarthritis, stiffness, right calf is soft, supple.  Negative Homans sign.  Left calf tight, edematous  Neurological: He is alert and oriented to person, place, and time. He has normal strength.  Skin: Skin is warm and dry. Laceration noted. He is not diaphoretic. No cyanosis. No pallor. Nails show no clubbing.  Psychiatric: He has a normal mood and affect. His speech is normal and behavior is normal. Judgment and thought content normal. Cognition and memory are normal.  Nursing note and vitals reviewed.   Labs reviewed: Recent Labs    05/21/17 0131 05/22/17 0413 05/25/17 1140  NA 134* 136 133*  K 4.7 3.9 3.7  CL 101 106 99*  CO2 _0 GLUCOSE 93 100* 102*  BUN 47* 36* 15  CREATININE 2.74* 1.31* 0.95  CALCIUM 7.4* 7.2* 7.5*   Recent Labs    04/15/17 1432 05/25/17 1140  AST 29 76*  ALT 36 73*  ALKPHOS 55 175*  BILITOT 1.6* 0.8  PROT 6.7 4.6*  ALBUMIN 4.3 1.8*   Recent Labs    04/15/17 1432 05/20/17 1825 05/21/17 0131 05/22/17 0413 05/25/17 1140  WBC 8.2 23.9* 19.6* 14.9* 20.1*  NEUTROABS 5.2 22.8*  --   --  17.3*  HGB 14.4 10.6* 8.8* 8.5* 9.6*  HCT 42.0 31.2* 26.0* 25.0* 28.8*  MCV 97.4 93.0 92.7 93.4 92.7  PLT 298.0 653* 504* 467* 565*   Lab Results  Component Value Date   TSH 1.74 04/15/2017   Lab Results  Component Value Date   HGBA1C 5.1 04/15/2017   Lab Results  Component Value Date   CHOL 135 04/15/2017   HDL 48.50 04/15/2017   LDLCALC 69 04/15/2017   TRIG 86.0 04/15/2017   CHOLHDL 3 04/15/2017    Significant Diagnostic Results in last 30 days:  Dg Chest Port 1 View  Result Date: 05/20/2017 CLINICAL DATA:  Sepsis EXAM: PORTABLE CHEST 1 VIEW COMPARISON:  None. FINDINGS: The heart size and mediastinal contours are within normal limits. Both lungs are clear. The visualized skeletal structures are unremarkable. IMPRESSION: No active disease. Electronically Signed   By: Inez Catalina M.D.   On: 05/20/2017 21:05   [place] Scrotom  W/doppler  Result Date: 05/20/2017 CLINICAL DATA:  Scrotal pain, swelling and erythema. EXAM: SCROTAL ULTRASOUND DOPPLER ULTRASOUND OF THE TESTICLES TECHNIQUE: Complete ultrasound examination of the testicles, epididymis, and other scrotal structures was performed. Color and spectral Doppler ultrasound were also utilized to evaluate blood flow to the testicles. COMPARISON:  None. FINDINGS:  Right testicle Measurements: 4.5 x 2.5 x 2.3 cm. No mass or microlithiasis visualized. Left testicle Measurements: 4.2 x 2.6 x 2.5 cm. No mass or microlithiasis visualized. Right epididymis:  Normal in size and appearance. Left epididymis:  Normal in size and appearance. Hydrocele: Oval cystic and solid appearing area along the lateral aspect of the left testicle measuring 3.4 x 1.6 x 1.5 cm. No internal blood flow seen within this area with color Doppler. Varicocele:  None visualized. Other: Diffuse scrotal skin thickening. Pulsed Doppler interrogation of both testes demonstrates normal low resistance arterial and venous waveforms bilaterally. IMPRESSION: 1. 3.4 x 1.6 x 1.5 cm probable complicated hydrocele lateral to the left testicle. This could represent a hydrocele complicated by infection or hemorrhage. 2. Diffuse scrotal skin thickening compatible with diffuse scrotal cellulitis. 3. Normal appearing testicles and epididymides. Electronically Signed   By: Claudie Revering M.D.   On: 05/20/2017 19:10    Assessment/Plan 1.  Edema, unspecified type  Lasix 40 mg IM daily times 3 days  Encouraged increased activity and range of motion primary osteoarthritis of left hip  2.  Primary osteoarthritis of left hip 3.  Status post total replacement of left hip  Continue PT/OT  Continue exercises as taught by PT/OT  Ice pack to left hip as needed for pain or edema  Skin care per protocol  Continue Tylenol 500 mg p.o. every 6 hours as needed pain  Follow-up with orthopedist as instructed  4.  C. difficile  colitis  Resolved  Family/ staff Communication:   Total Time:  Documentation:  Face to Face:  Family/Phone:   Labs/tests ordered: CBC, met C  Medication list reviewed and assessed for continued appropriateness. Monthly medication orders reviewed and signed.  Vikki Ports, NP-C Geriatrics [facility] [phone], [place] [address] Cell Phone (Mon-Fri 8am-5pm):  [phone] On Call:  [phone] & follow prompts after 5pm & weekends Office Phone:  [phone] Office Fax:  [phone]

## 2017-06-12 ENCOUNTER — Ambulatory Visit (INDEPENDENT_AMBULATORY_CARE_PROVIDER_SITE_OTHER): Payer: Medicare Other | Admitting: Urology

## 2017-06-12 ENCOUNTER — Encounter: Payer: Self-pay | Admitting: Urology

## 2017-06-12 ENCOUNTER — Other Ambulatory Visit
Admission: RE | Admit: 2017-06-12 | Discharge: 2017-06-12 | Disposition: A | Discharge status: Home / Self Care | Payer: Medicare Other | Source: Ambulatory Visit | Attending: Internal Medicine | Admitting: Internal Medicine

## 2017-06-12 VITALS — BP 109/70 | HR 94 | Ht 72.0 in | Wt 227.0 lb

## 2017-06-12 DIAGNOSIS — R339 Retention of urine, unspecified: Secondary | ICD-10-CM

## 2017-06-12 DIAGNOSIS — R609 Edema, unspecified: Secondary | ICD-10-CM | POA: Insufficient documentation

## 2017-06-12 DIAGNOSIS — N179 Acute kidney failure, unspecified: Secondary | ICD-10-CM | POA: Diagnosis not present

## 2017-06-12 LAB — COMPREHENSIVE METABOLIC PANEL
ALT: 77 U/L — ABNORMAL HIGH (ref 17–63)
AST: 69 U/L — ABNORMAL HIGH (ref 15–41)
Albumin: 3.1 g/dL — ABNORMAL LOW (ref 3.5–5.0)
Alkaline Phosphatase: 122 U/L (ref 38–126)
Anion gap: 12 (ref 5–15)
BUN: 28 mg/dL — ABNORMAL HIGH (ref 6–20)
CO2: 29 mmol/L (ref 22–32)
Calcium: 8.8 mg/dL — ABNORMAL LOW (ref 8.9–10.3)
Chloride: 92 mmol/L — ABNORMAL LOW (ref 101–111)
Creatinine, Ser: 0.99 mg/dL (ref 0.61–1.24)
GFR calc Af Amer: >60 mL/min (ref >60)
GFR calc non Af Amer: >60 mL/min (ref >60)
Glucose, Bld: 131 mg/dL — ABNORMAL HIGH (ref 65–99)
Potassium: 3.9 mmol/L (ref 3.5–5.1)
Sodium: 133 mmol/L — ABNORMAL LOW (ref 135–145)
Total Bilirubin: 1.1 mg/dL (ref 0.3–1.2)
Total Protein: 7 g/dL (ref 6.5–8.1)

## 2017-06-12 LAB — CBC WITH DIFFERENTIAL/PLATELET
Basophils Absolute: 0.1 10*3/uL (ref 0–0.1)
Basophils Relative: 1 %
Eosinophils Absolute: 0.1 10*3/uL (ref 0–0.7)
Eosinophils Relative: 1 %
HCT: 38.5 % — ABNORMAL LOW (ref 40.0–52.0)
Hemoglobin: 12.4 g/dL — ABNORMAL LOW (ref 13.0–18.0)
Lymphocytes Relative: 18 %
Lymphs Abs: 2.7 10*3/uL (ref 1.0–3.6)
MCH: 29.8 pg (ref 26.0–34.0)
MCHC: 32.3 g/dL (ref 32.0–36.0)
MCV: 92 fL (ref 80.0–100.0)
Monocytes Absolute: 0.9 10*3/uL (ref 0.2–1.0)
Monocytes Relative: 6 %
Neutro Abs: 11.2 10*3/uL — ABNORMAL HIGH (ref 1.4–6.5)
Neutrophils Relative %: 74 %
Platelets: 514 10*3/uL — ABNORMAL HIGH (ref 150–440)
RBC: 4.18 MIL/uL — ABNORMAL LOW (ref 4.40–5.90)
RDW: 16 % — ABNORMAL HIGH (ref 11.5–14.5)
WBC: 15.1 10*3/uL — ABNORMAL HIGH (ref 3.8–10.6)

## 2017-06-12 NOTE — Progress Notes (Signed)
06/12/2017 12:04 PM   Terry BoardsFrank E Vaughn 07/30/1932 [id number]  Referring provider: Allegra GranaArnett, [name], FNP [address] [place], [place] [id number]  Chief Complaint  Patient presents with  . Urinary Retention    2-3 follow up    HPI: 83 year old male who presents today for outpatient urology follow-up.  He was seen as an inpatient on 05/21/17 with urinary retention, penile and scrotal swelling, UTI, renal failure.  A Foley catheter was placed and it was ultimately discharged.  He was previously on Flomax and started on finasteride in addition to this.  Patient has been having difficulty voiding ever since his orthopedic surgery (anterior hip arthroplasty).  His recovery has been prolonged and he remains in rehab.   He underwent unsuccessful voiding trial on 05/27/2017 at his rehab facility.  His Foley catheter was replaced.  He continues to have difficulty ambulating as well as weightbearing.   PMH: Past Medical History:  Diagnosis Date  . Bone spur 1988   left heel  . Bursitis 1987   right shoulder  . Cellulitis 1986   right leg  . Shingles 1959    Surgical History: Past Surgical History:  Procedure Laterality Date  . APPENDECTOMY  1942  . CHOLECYSTECTOMY  1966  . EYE SURGERY  2015   catarcts extracted  . MELANOMA EXCISION  04.2010   mole removal right arm  . TONSILLECTOMY AND ADENOIDECTOMY  1944  . TOTAL HIP ARTHROPLASTY  1999   right hip    Home Medications:  Allergies as of 06/12/2017      Reactions   Codeine Other (See Comments)   Alters mental state      Medication List        Accurate as of 06/12/17 11:59 PM. Always use your most recent med list.          alum & mag hydroxide-simeth 400-400-40 MG/5ML suspension Commonly known as:  MAALOX PLUS Take 30 mLs by mouth every 4 (four) hours as needed for indigestion.   aspirin 81 MG chewable tablet Chew 81 mg by mouth daily.   bisacodyl 10 MG/30ML Enem Commonly known as:  FLEET Place  10 mg rectally 2 (two) times daily as needed.   CENTRUM SILVER ULTRA MENS Tabs Take 1 each by mouth daily.   DERMACLOUD Crea Apply 1 application daily topically.   ENSURE ENLIVE PO Take 1 Bottle 2 (two) times daily between meals by mouth.   feeding supplement (PRO-STAT SUGAR FREE 64) Liqd Take 30 mLs 2 (two) times daily between meals by mouth.   finasteride 5 MG tablet Commonly known as:  PROSCAR Take 1 tablet (5 mg total) by mouth daily.   omeprazole 20 MG capsule Commonly known as:  PRILOSEC Take 1 capsule (20 mg total) by mouth daily.   sennosides-docusate sodium 8.6-50 MG tablet Commonly known as:  SENOKOT-S Take 2 tablets by mouth 2 (two) times daily.   tamsulosin 0.4 MG Caps capsule Commonly known as:  FLOMAX Take 0.4 mg by mouth at bedtime.   torsemide 5 MG tablet Commonly known as:  DEMADEX Take 5 mg daily by mouth.       Allergies:  Allergies  Allergen Reactions  . Codeine Other (See Comments)    Alters mental state    Family History: Family History  Problem Relation Age of Onset  . Clotting disorder Mother   . Heart disease Father [id number]  . Cancer Sister        breast  . Cerebral aneurysm  Brother        hemmorage  . Cancer Daughter        breast    Social History:  reports that  has never smoked. he has never used smokeless tobacco. He reports that he does not drink alcohol or use drugs.  ROS: UROLOGY Frequent Urination?: No Hard to postpone urination?: No Burning/pain with urination?: No Get up at night to urinate?: No Leakage of urine?: No Urine stream starts and stops?: No Trouble starting stream?: No Do you have to strain to urinate?: No Blood in urine?: No Urinary tract infection?: No Sexually transmitted disease?: No Injury to kidneys or bladder?: No Painful intercourse?: No Weak stream?: No Erection problems?: No Penile pain?: No  Gastrointestinal Nausea?: No Vomiting?: No Indigestion/heartburn?: No Diarrhea?:  No Constipation?: No  Constitutional Fever: No Night sweats?: No Weight loss?: No Fatigue?: No  Skin Skin rash/lesions?: No Itching?: No  Eyes Blurred vision?: No Double vision?: No  Ears/Nose/Throat Sore throat?: No Sinus problems?: No  Hematologic/Lymphatic Swollen glands?: No Easy bruising?: No  Cardiovascular Leg swelling?: No Chest pain?: No  Respiratory Cough?: No Shortness of breath?: No  Endocrine Excessive thirst?: No  Musculoskeletal Back pain?: No Joint pain?: No  Neurological Headaches?: No Dizziness?: No  Psychologic Depression?: No Anxiety?: No  Physical Exam: BP 109/70   Pulse 94   Ht 6' (1.829 m)   Wt 227 lb (103 kg)   BMI 30.79 kg/m   Constitutional:  Alert and oriented, No acute distress.  In wheelchair, accompanied by wife. HEENT: [place] AT, moist mucus membranes.  Trachea midline, no masses. Respiratory: Normal respiratory effort, no increased work of breathing. GI: Abdomen is soft, nontender, nondistended, no abdominal masses GU: Uncircumcised phallus with Foley catheter in place.  No penoscrotal edema today.  Testicles descended bilaterally. Skin: No rashes, bruises or suspicious lesions. Neurologic: Grossly intact, no focal deficits, moving all 4 extremities.  Difficulty standing to rise, requires assistance, bracing on a walker. Psychiatric: Normal mood and affect.  Laboratory Data: Lab Results  Component Value Date   WBC 15.1 (H) 06/12/2017   HGB 12.4 (L) 06/12/2017   HCT 38.5 (L) 06/12/2017   MCV 92.0 06/12/2017   PLT 514 (H) 06/12/2017    Lab Results  Component Value Date   CREATININE 0.99 06/12/2017    Lab Results  Component Value Date   HGBA1C 5.1 04/15/2017    Urinalysis Lab Results  Component Value Date   APPEARANCEUR CLEAR (A) 05/28/2017   LEUKOCYTESUR TRACE (A) 05/28/2017   PROTEINUR NEGATIVE 05/28/2017   GLUCOSEU NEGATIVE 05/28/2017   RBCU TOO NUMEROUS TO COUNT 05/28/2017   BILIRUBINUR NEGATIVE  05/28/2017   NITRITE NEGATIVE 05/28/2017    Lab Results  Component Value Date   BACTERIA NONE SEEN 05/28/2017    Pertinent Imaging: No new interval imaging  Assessment & Plan:   1. Urinary retention Status post unsuccessful void trial today, bladder filled with approximately 300 cc of sterile water but unable to void.  Order written for rehab facility to replace catheter by 4 PM today if he is yet to void or if he becomes uncomfortable.  Suspect urinary retention likely multifactorial including acute medical illness, decreased mobility, probable underlying BPH with bladder outlet obstruction, recent UTI, amongst others.  Continue Flomax/finasteride  Deferred rectal exam today given patient had very difficult time standing and was unsteady.  Will recheck next time.  Additionally, deferred PSA in the setting of urinary retention.  Plan for repeat void trial in 1  month.  If he continues to fail, we will discuss options for long-term bladder management including clean intermittent catheterization, indwelling Foley catheter, suprapubic tube placement.  At this point in time, he is a poor candidate for an outlet procedure.  He is agreeable to this plan.  2. Acute kidney injury (HCC) Creatinine above 3 at the time of acute urinary retention, has now returned to baseline, most recent creatinine 0.99.   Return in about 4 weeks (around 07/10/2017) for possible VT, DRE.  Vanna Scotland, MD  [facility] [address], [address] [place], [place] [address] [phone]

## 2017-06-17 ENCOUNTER — Encounter: Payer: Self-pay | Admitting: Gerontology

## 2017-06-22 ENCOUNTER — Ambulatory Visit: Payer: Medicare Other | Admitting: Podiatry

## 2017-07-01 ENCOUNTER — Telehealth: Payer: Self-pay

## 2017-07-01 NOTE — Telephone Encounter (Signed)
Verbals given.   Copied from CRM [phone]. Topic: General - Other >> Jul 01, 2017  3:56 PM Percival SpanishKennedy, Cheryl W wrote:   Will with [facility] call to ask for verbal orders for OT 2 TIMES A WEEK for 2 WEEKS  [phone]

## 2017-07-06 DIAGNOSIS — Z96649 Presence of unspecified artificial hip joint: Secondary | ICD-10-CM | POA: Insufficient documentation

## 2017-07-06 NOTE — Progress Notes (Signed)
This encounter was created in error - please disregard.

## 2017-07-10 ENCOUNTER — Encounter: Payer: Self-pay | Admitting: Urology

## 2017-07-10 ENCOUNTER — Ambulatory Visit (INDEPENDENT_AMBULATORY_CARE_PROVIDER_SITE_OTHER): Payer: Medicare Other | Admitting: Urology

## 2017-07-10 VITALS — BP 144/71 | HR 102 | Ht 73.0 in | Wt 242.0 lb

## 2017-07-10 DIAGNOSIS — N138 Other obstructive and reflux uropathy: Secondary | ICD-10-CM

## 2017-07-10 DIAGNOSIS — N401 Enlarged prostate with lower urinary tract symptoms: Secondary | ICD-10-CM

## 2017-07-10 DIAGNOSIS — R339 Retention of urine, unspecified: Secondary | ICD-10-CM

## 2017-07-10 NOTE — Progress Notes (Signed)
07/10/2017 4:31 PM   Terry Vaughn 05/08/1933 [id number]  Referring provider: Allegra GranaArnett, [name], FNP [address] [place], [place] [address]  Chief Complaint  Patient presents with  . Urinary Retention    81month    HPI: 83 year old male who presents today for repeat voiding trial.     He was seen as an inpatient on 05/21/17 with urinary retention, penile and scrotal swelling, UTI, renal failure.  A Foley catheter was placed and it was ultimately discharged.  He was previously started on on Flomax and started on finasteride as inpatient.  At the time of attention, he was also in acute renal failure with a creatinine around 3.  This is since returned to normal.  Since his initial presentation, he is failed several voiding trials.  He is now out of rehab at home.  He continues to work with PT and OT.  His recovery has been slow but gradually improving.     PMH: Past Medical History:  Diagnosis Date  . Bone spur 1988   left heel  . Bursitis 1987   right shoulder  . Cellulitis 1986   right leg  . Shingles 1959    Surgical History: Past Surgical History:  Procedure Laterality Date  . APPENDECTOMY  1942  . CHOLECYSTECTOMY  1966  . EYE SURGERY  2015   catarcts extracted  . MELANOMA EXCISION  04.2010   mole removal right arm  . TONSILLECTOMY AND ADENOIDECTOMY  1944  . TOTAL HIP ARTHROPLASTY  1999   right hip    Home Medications:  Allergies as of 07/10/2017      Reactions   Codeine Other (See Comments)   Alters mental state      Medication List        Accurate as of 07/10/17 11:59 PM. Always use your most recent med list.          acetaminophen 500 MG tablet Commonly known as:  TYLENOL Take 500 mg by mouth every 6 (six) hours as needed.   alum & mag hydroxide-simeth 400-400-40 MG/5ML suspension Commonly known as:  MAALOX PLUS Take 30 mLs by mouth every 4 (four) hours as needed for indigestion.   aspirin 81 MG chewable tablet Chew 81 mg  by mouth daily.   bisacodyl 10 MG/30ML Enem Commonly known as:  FLEET Place 10 mg rectally 2 (two) times daily as needed.   CENTRUM SILVER ULTRA MENS Tabs Take 1 each by mouth daily.   DERMACLOUD Crea Apply 1 application daily topically.   ENSURE ENLIVE PO Take 1 Bottle 2 (two) times daily between meals by mouth.   Ergocalciferol 400 units Tabs Take 1 tablet by mouth daily.   feeding supplement (PRO-STAT SUGAR FREE 64) Liqd Take 30 mLs 2 (two) times daily between meals by mouth.   finasteride 5 MG tablet Commonly known as:  PROSCAR Take 1 tablet (5 mg total) by mouth daily.   omeprazole 20 MG capsule Commonly known as:  PRILOSEC Take 1 capsule (20 mg total) by mouth daily.   potassium chloride 10 MEQ CR capsule Commonly known as:  MICRO-K Take 10 mEq by mouth daily.   tamsulosin 0.4 MG Caps capsule Commonly known as:  FLOMAX Take 0.4 mg by mouth at bedtime.   torsemide 5 MG tablet Commonly known as:  DEMADEX Take 5 mg daily by mouth.       Allergies:  Allergies  Allergen Reactions  . Codeine Other (See Comments)    Alters mental  state    Family History: Family History  Problem Relation Age of Onset  . Clotting disorder Mother   . Heart disease Father 8  . Cancer Sister        breast  . Cerebral aneurysm Brother        hemmorage  . Cancer Daughter        breast    Social History:  reports that  has never smoked. he has never used smokeless tobacco. He reports that he does not drink alcohol or use drugs.  ROS: UROLOGY Frequent Urination?: No Hard to postpone urination?: No Burning/pain with urination?: No Get up at night to urinate?: No Leakage of urine?: No Urine stream starts and stops?: No Trouble starting stream?: No Do you have to strain to urinate?: No Blood in urine?: No Urinary tract infection?: No Sexually transmitted disease?: No Injury to kidneys or bladder?: No Painful intercourse?: No Weak stream?: No Erection problems?:  No Penile pain?: No  Gastrointestinal Nausea?: No Vomiting?: No Indigestion/heartburn?: No Diarrhea?: No Constipation?: No  Constitutional Fever: No Night sweats?: No Weight loss?: No Fatigue?: No  Skin Skin rash/lesions?: No Itching?: No  Eyes Blurred vision?: No Double vision?: No  Ears/Nose/Throat Sore throat?: No Sinus problems?: No  Hematologic/Lymphatic Swollen glands?: No Easy bruising?: No  Cardiovascular Leg swelling?: Yes Chest pain?: No  Respiratory Cough?: No Shortness of breath?: No  Endocrine Excessive thirst?: No  Musculoskeletal Back pain?: No Joint pain?: No  Neurological Headaches?: No Dizziness?: No  Psychologic Depression?: No Anxiety?: No  Physical Exam: BP (!) 144/71   Pulse (!) 102   Ht 6\' 1"  (1.854 m)   Wt 242 lb (109.8 kg)   BMI 31.93 kg/m   Constitutional:  Alert and oriented, No acute distress.  In wheelchair, accompanied by wife. HEENT: [place] AT, moist mucus membranes.  Trachea midline, no masses. Respiratory: Normal respiratory effort, no increased work of breathing. GI: Abdomen is soft, nontender, nondistended, no abdominal masses GU: Uncircumcised phallus with Foley catheter in place.   Rectal: Mildly decreased sphincter tone.  Enlarged prostate, nontender, no obvious nodules. Skin: No rashes, bruises or suspicious lesions. Neurologic: Grossly intact, no focal deficits, moving all 4 extremities.  Difficulty standing to rise, requires assistance, bracing on a walker. Psychiatric: Normal mood and affect.  Laboratory Data: Lab Results  Component Value Date   WBC 15.1 (H) 06/12/2017   HGB 12.4 (L) 06/12/2017   HCT 38.5 (L) 06/12/2017   MCV 92.0 06/12/2017   PLT 514 (H) 06/12/2017    Lab Results  Component Value Date   CREATININE 0.99 06/12/2017    Lab Results  Component Value Date   HGBA1C 5.1 04/15/2017    Urinalysis Lab Results  Component Value Date   APPEARANCEUR CLEAR (A) 05/28/2017    LEUKOCYTESUR TRACE (A) 05/28/2017   PROTEINUR NEGATIVE 05/28/2017   GLUCOSEU NEGATIVE 05/28/2017   RBCU TOO NUMEROUS TO COUNT 05/28/2017   BILIRUBINUR NEGATIVE 05/28/2017   NITRITE NEGATIVE 05/28/2017    Lab Results  Component Value Date   BACTERIA NONE SEEN 05/28/2017    Pertinent Imaging: No new interval imaging  Assessment & Plan:   1. Urinary retention/ BPH Suspect urinary retention likely multifactorial including acute medical illness, decreased mobility, probable underlying BPH with bladder outlet obstruction, recent UTI, amongst others.  Failed voiding trial again today.  We discussed iscuss options for long-term bladder management including clean intermittent catheterization, indwelling Foley catheter, suprapubic tube placement.  At this point in time, he is a  poor candidate for an outlet procedure.  He is agreeable to this plan.   Continue Flomax/finasteride  Rectal exam unremarkable, will repeat PSA with next visit once the catheter has been out for a period of time.  We will only act on his PSA visits markedly elevated.  He was interested in learning clean intermittent catheterization ability at this point time, he will need to self cath 4-5 times daily using a coud-tip catheter in the setting of an enlarged prostate gland.  Voiding diary was also given.    Return in about 3 months (around 10/08/2017) for recheck bladder symptoms./ PSA  Vanna ScotlandAshley Mattelyn Imhoff, MD  [facility] [address], [address] [place], [place] [address] [phone]

## 2017-07-10 NOTE — Progress Notes (Signed)
Fill and Pull Catheter Removal  Patient is present today for a catheter removal.  Patient was cleaned and prepped in a sterile fashion 250ml of sterile water/ saline was instilled into the bladder when the patient felt the urge to urinate. 8ml of water was then drained from the balloon.  A 16FR foley cath was removed from the bladder no complications were noted .  Patient as then given some time to void on their own.  Patient cannot void on their own after some time.  Patient tolerated well.  Preformed by: Terry Vaughn, CMA  Follow up/ Additional notes: Per Dr. Apolinar Vaughn patient needs to learn CIC

## 2017-07-13 ENCOUNTER — Ambulatory Visit (INDEPENDENT_AMBULATORY_CARE_PROVIDER_SITE_OTHER): Payer: Medicare Other | Admitting: Podiatry

## 2017-07-13 ENCOUNTER — Encounter: Payer: Self-pay | Admitting: Podiatry

## 2017-07-13 DIAGNOSIS — M79676 Pain in unspecified toe(s): Secondary | ICD-10-CM

## 2017-07-13 DIAGNOSIS — M2030 Hallux varus (acquired), unspecified foot: Secondary | ICD-10-CM

## 2017-07-13 DIAGNOSIS — B351 Tinea unguium: Secondary | ICD-10-CM | POA: Diagnosis not present

## 2017-07-13 NOTE — Progress Notes (Signed)
Continuous Intermittent Catheterization  Due to urinary retention patient is present today for a teaching of self I & O Catheterization. Patient was given detailed verbal and printed instructions of self catheterization. Patient was cleaned and prepped in a sterile fashion.  With instruction and assistance patient inserted a 14FR and urine return was noted 300 ml, urine was clear in color. Patient tolerated well, no complications were noted Patient was given a sample bag with supplies to take home.  Instructions were given per Dr. Apolinar JunesBrandon for patient to cath 4 times daily.  An order was placed with [facility] for catheters to be sent to the patient's home. Patient is to follow up 3 months.  Preformed by: Rupert Stackshelsea Mikal Blasdell, LPN

## 2017-07-13 NOTE — Progress Notes (Signed)
Complaint:  Visit Type: Patient returns to my office for continued preventative foot care services. Complaint: Patient states" my nails have grown long and thick and become painful to walk and wear shoes" Patient says he has two painful calluses on his right foot.  One callus on the inside right heel and one at tip right big toe. The patient presents for preventative foot care services. No changes to ROS  Podiatric Exam: Vascular: dorsalis pedis and posterior tibial pulses are palpable bilateral. Capillary return is immediate. Temperature gradient is WNL. Skin turgor WNL  Sensorium: Normal Semmes Weinstein monofilament test. Normal tactile sensation bilaterally. Nail Exam: Pt has thick disfigured discolored nails with subungual debris noted bilateral entire nail hallux through fifth toenails Ulcer Exam: Pre-ulcerous callus at distal aspect medial border right hallux. Orthopedic Exam: Muscle tone and strength are WNL. No limitations in general ROM. No crepitus or effusions noted. Foot type and digits show no abnormalities. Bony prominences are unremarkable. Skin: No Porokeratosis. No infection or ulcers.     Diagnosis:  Onychomycosis, , Pain in right toe, pain in left toes    Treatment & Plan Procedures and Treatment: Consent by patient was obtained for treatment procedures. The patient understood the discussion of treatment and procedures well. All questions were answered thoroughly reviewed. Debridement of mycotic and hypertrophic toenails, 1 through 5 bilateral and clearing of subungual debris. No ulceration, no infection noted. Return Visit-Office Procedure: Patient instructed to return to the office for a follow up visit 3 months for continued evaluation and treatment.    Fahad Cisse DPM 

## 2017-07-15 ENCOUNTER — Ambulatory Visit: Payer: Medicare Other | Admitting: Internal Medicine

## 2017-07-15 ENCOUNTER — Encounter: Payer: Self-pay | Admitting: Internal Medicine

## 2017-07-15 VITALS — BP 142/78 | HR 81 | Temp 97.8°F | Resp 15 | Ht 73.0 in | Wt 248.4 lb

## 2017-07-15 DIAGNOSIS — A0472 Enterocolitis due to Clostridium difficile, not specified as recurrent: Secondary | ICD-10-CM | POA: Diagnosis not present

## 2017-07-15 DIAGNOSIS — N289 Disorder of kidney and ureter, unspecified: Secondary | ICD-10-CM

## 2017-07-15 DIAGNOSIS — N431 Infected hydrocele: Secondary | ICD-10-CM | POA: Diagnosis not present

## 2017-07-15 DIAGNOSIS — E781 Pure hyperglyceridemia: Secondary | ICD-10-CM

## 2017-07-15 DIAGNOSIS — N138 Other obstructive and reflux uropathy: Secondary | ICD-10-CM | POA: Diagnosis not present

## 2017-07-15 DIAGNOSIS — N401 Enlarged prostate with lower urinary tract symptoms: Secondary | ICD-10-CM

## 2017-07-15 DIAGNOSIS — D62 Acute posthemorrhagic anemia: Secondary | ICD-10-CM

## 2017-07-15 LAB — CBC WITH DIFFERENTIAL/PLATELET
Basophils Absolute: 47 cells/uL (ref 0–200)
Basophils Relative: 0.5 %
Eosinophils Absolute: 179 cells/uL (ref 15–500)
Eosinophils Relative: 1.9 %
HCT: 36.5 % — ABNORMAL LOW (ref 38.5–50.0)
Hemoglobin: 12.8 g/dL — ABNORMAL LOW (ref 13.2–17.1)
Lymphs Abs: 1748 cells/uL (ref 850–3900)
MCH: 31.5 pg (ref 27.0–33.0)
MCHC: 35.1 g/dL (ref 32.0–36.0)
MCV: 89.9 fL (ref 80.0–100.0)
MPV: 10.6 fL (ref 7.5–12.5)
Monocytes Relative: 8.2 %
Neutro Abs: 6655 cells/uL (ref 1500–7800)
Neutrophils Relative %: 70.8 %
Platelets: 343 10*3/uL (ref 140–400)
RBC: 4.06 10*6/uL — ABNORMAL LOW (ref 4.20–5.80)
RDW: 15.4 % — ABNORMAL HIGH (ref 11.0–15.0)
Total Lymphocyte: 18.6 %
WBC mixed population: 771 cells/uL (ref 200–950)
WBC: 9.4 10*3/uL (ref 3.8–10.8)

## 2017-07-15 LAB — COMPREHENSIVE METABOLIC PANEL
ALT: 26 U/L (ref 0–53)
AST: 26 U/L (ref 0–37)
Albumin: 3.6 g/dL (ref 3.5–5.2)
Alkaline Phosphatase: 64 U/L (ref 39–117)
BUN: 16 mg/dL (ref 6–23)
CO2: 30 mEq/L (ref 19–32)
Calcium: 9.4 mg/dL (ref 8.4–10.5)
Chloride: 103 mEq/L (ref 96–112)
Creatinine, Ser: 1.04 mg/dL (ref 0.40–1.50)
GFR: 72.54 mL/min (ref >60.00)
Glucose, Bld: 95 mg/dL (ref 70–99)
Potassium: 4.8 mEq/L (ref 3.5–5.1)
Sodium: 140 mEq/L (ref 135–145)
Total Bilirubin: 0.8 mg/dL (ref 0.2–1.2)
Total Protein: 6.8 g/dL (ref 6.0–8.3)

## 2017-07-15 MED ORDER — TORSEMIDE 5 MG PO TABS: 5.0000 mg | ORAL_TABLET | Freq: Every day | ORAL | 0 refills | Status: DC

## 2017-07-15 MED ORDER — TAMSULOSIN HCL 0.4 MG PO CAPS: 0.4000 mg | ORAL_CAPSULE | Freq: Every day | ORAL | 1 refills | Status: DC

## 2017-07-15 MED ORDER — FINASTERIDE 5 MG PO TABS: 5.0000 mg | ORAL_TABLET | Freq: Every day | ORAL | 1 refills | Status: DC

## 2017-07-15 MED ORDER — POTASSIUM CHLORIDE ER 10 MEQ PO CPCR: 10.0000 meq | ORAL_CAPSULE | Freq: Every day | ORAL | 0 refills | Status: DC

## 2017-07-15 NOTE — Progress Notes (Signed)
Subjective:  Patient ID: Terry Vaughn, male    DOB: 03/12/1933  Age: 83 y.o. MRN: [id number]  CC: The primary encounter diagnosis was Acute renal insufficiency. Diagnoses of Acute blood loss as cause of postoperative anemia, Infected hydrocele, Hypertriglyceridemia, BPH with urinary obstruction, and Colitis due to Clostridium difficile were also pertinent to this visit.  HPI Terry BoardsFrank E Vaughn presents for hospital follow up.    Patient is s/p  Total Left hip replacement  on 05/01/17 in [place].  He was [facility] on 10[facility],  ADMITTED  To [facility] ON 10/24 FOR sepsis due to UTI, ACUTE KIDNEY FAILURE SECONDARY TO URINARY RETENTION AND INFECTED HYDROCELE,  SEEN BY UROLOGY.   TREATED WITH IV CEFTRIAXONE, Discharged on ceftin for 10 days (bloood cultures neg,  Urine positive for  E Coli).  FOLEY CATHETER PLACED,  Flomax and Tamsulosin started,  dsicharged on  10.26 back to skilled nursing.   C DIFICILE COLITIS  Diagnosed on Oct 30th,  Treated by MA with oral vancomycin. Cedftin changed t ciprofloxacin for ongoing treatment of epididymitis. Developed skin irritaiton form prolonged contact in the groin with the diarrhea  Derma cloud cleared up the irritation..  Diarrhea has resolved,  Using prune juice and prn stool softeners for  occasional constipation ,  Appetite improving since home   Seen by Urology for hosptal follow up Nov 16 . Again failed voiding trial  Discharged to Home with [facility] on 11/21 with Foley in place   12/14 FOLEY REMOVED,  Still UNABLE TO VOID,  SELF CATHETERIZING 4 times  DAILY FOR HE NEXT 3 MONTHS UNTIL FOLLOW UP WITH UROLOGY   Takes daily potassium  And torsemide  both were started by Vella KohlerMarshal anderson during reahab )not on 10/26 dc summary )   Received blood transfusion  of 2 units of blood during October 26 hospitalization  fro drop to 8.5   Diarrhea resolved     No facility-administered medications prior to visit.    Outpatient Medications Prior to Visit    Medication Sig Dispense Refill  . aspirin 81 MG chewable tablet Chew 81 mg by mouth daily.    . Ergocalciferol (VITAMIN D2) 400 units TABS Take 1 tablet by mouth daily.    . feeding supplement, ENSURE ENLIVE, (ENSURE ENLIVE) LIQD Take 237 mLs by mouth 2 (two) times daily between meals.    . Infant Care Products (DERMACLOUD) CREA Apply topically.    . Multiple Vitamins-Minerals (CENTRUM SILVER ULTRA MENS) TABS Take 1 each by mouth daily.    Marland Kitchen. omeprazole (PRILOSEC) 20 MG capsule Take 1 capsule (20 mg total) by mouth daily. (Patient not taking: Reported on 07/17/2017) 90 capsule 3  . finasteride (PROSCAR) 5 MG tablet Take 1 tablet (5 mg total) by mouth daily.    . Infant Care Products (DERMACLOUD) CREA Apply 1 application daily topically.    . Nutritional Supplements (ENSURE ENLIVE PO) Take 1 Bottle 2 (two) times daily between meals by mouth.    . potassium chloride (MICRO-K) 10 MEQ CR capsule Take 10 mEq by mouth daily.    . tamsulosin (FLOMAX) 0.4 MG CAPS capsule Take 0.4 mg by mouth at bedtime.    . torsemide (DEMADEX) 5 MG tablet Take 5 mg daily by mouth.    Marland Kitchen. acetaminophen (TYLENOL) 500 MG tablet Take 500 mg by mouth every 6 (six) hours as needed.    Marland Kitchen. alum & mag hydroxide-simeth (MAALOX PLUS) 400-400-40 MG/5ML suspension Take 30 mLs by mouth every  4 (four) hours as needed for indigestion.    . Amino Acids-Protein Hydrolys (FEEDING SUPPLEMENT, PRO-STAT SUGAR FREE 64,) LIQD Take 30 mLs 2 (two) times daily between meals by mouth.    . bisacodyl (FLEET) 10 MG/30ML ENEM Place 10 mg rectally 2 (two) times daily as needed.    . Ergocalciferol 400 units TABS Take 1 tablet by mouth daily.      Review of Systems;  Patient denies headache, fevers, malaise, unintentional weight loss, skin rash, eye pain, sinus congestion and sinus pain, sore throat, dysphagia,  hemoptysis , cough, dyspnea, wheezing, chest pain, palpitations, orthopnea, edema, abdominal pain, nausea, melena, diarrhea, constipation,  flank pain, dysuria, hematuria, urinary  Frequency, nocturia, numbness, tingling, seizures,  Focal weakness, Loss of consciousness,  Tremor, insomnia, depression, anxiety, and suicidal ideation.      Objective:  BP (!) 142/78 (BP Location: Left Arm, Patient Position: Sitting, Cuff Size: Normal)   Pulse 81   Temp 97.8 F (36.6 C) (Oral)   Resp 15   Ht 6\' 1"  (1.854 m)   Wt 248 lb 6.4 oz (112.7 kg)   SpO2 96%   BMI 32.77 kg/m   BP Readings from Last 3 Encounters:  07/18/17 (!) 139/57  07/15/17 (!) 142/78  07/10/17 (!) 144/71    Wt Readings from Last 3 Encounters:  07/17/17 250 lb 9.6 oz (113.7 kg)  07/15/17 248 lb 6.4 oz (112.7 kg)  07/10/17 242 lb (109.8 kg)    General appearance: alert, cooperative and appears stated age Ears: normal TM's and external ear canals both ears Throat: lips, mucosa, and tongue normal; teeth and gums normal Neck: no adenopathy, no carotid bruit, supple, symmetrical, trachea midline and thyroid not enlarged, symmetric, no tenderness/mass/nodules Back: symmetric, no curvature. ROM normal. No CVA tenderness. Lungs: clear to auscultation bilaterally Heart: regular rate and rhythm, S1, S2 normal, no murmur, click, rub or gallop Abdomen: soft, non-tender; bowel sounds normal; no masses,  no organomegaly Pulses: 2+ and symmetric Skin: Skin color, texture, turgor normal. No rashes or lesions Lymph nodes: Cervical, supraclavicular, and axillary nodes normal.  Lab Results  Component Value Date   HGBA1C 5.1 04/15/2017   HGBA1C 5.1 05/16/2016    Lab Results  Component Value Date   CREATININE 1.24 07/18/2017   CREATININE 1.25 (H) 07/17/2017   CREATININE 1.04 07/15/2017    Lab Results  Component Value Date   WBC 20.9 (H) 07/17/2017   HGB 12.6 (L) 07/17/2017   HCT 37.4 (L) 07/17/2017   PLT 293 07/17/2017   GLUCOSE 148 (H) 07/17/2017   CHOL 135 04/15/2017   TRIG 86.0 04/15/2017   HDL 48.50 04/15/2017   LDLCALC 69 04/15/2017   ALT 24 07/17/2017    AST 34 07/17/2017   NA 136 07/17/2017   K 3.8 07/17/2017   CL 99 (L) 07/17/2017   CREATININE 1.24 07/18/2017   BUN 20 07/17/2017   CO2 28 07/17/2017   TSH 1.74 04/15/2017   INR 1.39 05/20/2017   HGBA1C 5.1 04/15/2017   MICROALBUR 0.5 12/01/2012    No results found.  Assessment & Plan:   Problem List Items Addressed This Visit    Acute renal insufficiency - Primary    Secondary to outlet obstruction . Cr has returned to baseline and potassium is normal using supplement and torsemide for mnagement of scrotal swelling  Lab Results  Component Value Date   CREATININE 1.24 07/18/2017   Lab Results  Component Value Date   NA 136 07/17/2017   K 3.8  07/17/2017   CL 99 (L) 07/17/2017   CO2 28 07/17/2017         Relevant Orders   Comprehensive metabolic panel (Completed)   BPH with urinary obstruction    Continue self catheterization , Flomax, and tamsulosin      Relevant Medications   finasteride (PROSCAR) 5 MG tablet   tamsulosin (FLOMAX) 0.4 MG CAPS capsule   Colitis due to Clostridium difficile    Resolved currently.  Occurred post operatively l.  Advised to continue daily use of probiotics.       RESOLVED: Hypertriglyceridemia    Lab Results  Component Value Date   CHOL 135 04/15/2017   HDL 48.50 04/15/2017   LDLCALC 69 04/15/2017   TRIG 86.0 04/15/2017   CHOLHDL 3 04/15/2017         Relevant Medications   torsemide (DEMADEX) 5 MG tablet   Infected hydrocele    He has finished Ciprofloxacin course , complicated by ci dificile colitis,  Now resolved . Follow up with Urology       Other Visit Diagnoses    Acute blood loss as cause of postoperative anemia       Relevant Orders   CBC w/Diff (Completed)      I have discontinued Terry Vaughn's bisacodyl, alum & mag hydroxide-simeth, Nutritional Supplements (ENSURE ENLIVE PO), feeding supplement (PRO-STAT SUGAR FREE 64), Ergocalciferol, and acetaminophen. I have also changed his tamsulosin,  torsemide, and potassium chloride. Additionally, I am having him maintain his CENTRUM SILVER ULTRA MENS, omeprazole, aspirin, DERMACLOUD, feeding supplement (ENSURE ENLIVE), Vitamin D2, and finasteride.  Meds ordered this encounter  Medications  . finasteride (PROSCAR) 5 MG tablet    Sig: Take 1 tablet (5 mg total) by mouth daily.    Dispense:  90 tablet    Refill:  1  . tamsulosin (FLOMAX) 0.4 MG CAPS capsule    Sig: Take 1 capsule (0.4 mg total) by mouth at bedtime.    Dispense:  90 capsule    Refill:  1  . torsemide (DEMADEX) 5 MG tablet    Sig: Take 1 tablet (5 mg total) by mouth daily.    Dispense:  90 tablet    Refill:  0  . potassium chloride (MICRO-K) 10 MEQ CR capsule    Sig: Take 1 capsule (10 mEq total) by mouth daily.    Dispense:  90 capsule    Refill:  0   A total of 25 minutes of face to face time was spent with patient more than half of which was spent in counselling about the above mentioned conditions  and coordination of care  Medications Discontinued During This Encounter  Medication Reason  . Nutritional Supplements (ENSURE ENLIVE PO) Patient has not taken in last 30 days  . Infant Care Products [facility](DERMACLOUD) CREA Patient has not taken in last 30 days  . Ergocalciferol 400 units TABS Patient has not taken in last 30 days  . Amino Acids-Protein Hydrolys (FEEDING SUPPLEMENT, PRO-STAT SUGAR FREE 64,) LIQD Patient has not taken in last 30 days  . acetaminophen (TYLENOL) 500 MG tablet Patient has not taken in last 30 days  . alum & mag hydroxide-simeth (MAALOX PLUS) 400-400-40 MG/5ML suspension Patient has not taken in last 30 days  . bisacodyl (FLEET) 10 MG/30ML ENEM Patient has not taken in last 30 days  . finasteride (PROSCAR) 5 MG tablet Reorder  . tamsulosin (FLOMAX) 0.4 MG CAPS capsule Reorder  . torsemide (DEMADEX) 5 MG tablet Reorder  . potassium  chloride (MICRO-K) 10 MEQ CR capsule Reorder    Follow-up: Return in about 3 months (around  10/13/2017).   Sherlene Shams, MD

## 2017-07-17 ENCOUNTER — Inpatient Hospital Stay
Admission: EM | Admit: 2017-07-17 | Discharge: 2017-07-19 | DRG: 872 | Disposition: A | Discharge status: Home / Self Care | Payer: Medicare Other | Attending: Specialist | Admitting: Specialist

## 2017-07-17 ENCOUNTER — Other Ambulatory Visit: Payer: Self-pay

## 2017-07-17 ENCOUNTER — Emergency Department: Payer: Medicare Other

## 2017-07-17 DIAGNOSIS — Z7982 Long term (current) use of aspirin: Secondary | ICD-10-CM | POA: Diagnosis not present

## 2017-07-17 DIAGNOSIS — K219 Gastro-esophageal reflux disease without esophagitis: Secondary | ICD-10-CM | POA: Diagnosis present

## 2017-07-17 DIAGNOSIS — Z96642 Presence of left artificial hip joint: Secondary | ICD-10-CM | POA: Diagnosis present

## 2017-07-17 DIAGNOSIS — A419 Sepsis, unspecified organism: Secondary | ICD-10-CM

## 2017-07-17 DIAGNOSIS — N390 Urinary tract infection, site not specified: Secondary | ICD-10-CM | POA: Diagnosis present

## 2017-07-17 DIAGNOSIS — Z79899 Other long term (current) drug therapy: Secondary | ICD-10-CM | POA: Diagnosis not present

## 2017-07-17 DIAGNOSIS — R338 Other retention of urine: Secondary | ICD-10-CM | POA: Diagnosis present

## 2017-07-17 DIAGNOSIS — N401 Enlarged prostate with lower urinary tract symptoms: Secondary | ICD-10-CM | POA: Diagnosis present

## 2017-07-17 DIAGNOSIS — I10 Essential (primary) hypertension: Secondary | ICD-10-CM | POA: Diagnosis present

## 2017-07-17 DIAGNOSIS — Z85820 Personal history of malignant melanoma of skin: Secondary | ICD-10-CM | POA: Diagnosis not present

## 2017-07-17 DIAGNOSIS — J189 Pneumonia, unspecified organism: Secondary | ICD-10-CM

## 2017-07-17 DIAGNOSIS — Z885 Allergy status to narcotic agent status: Secondary | ICD-10-CM

## 2017-07-17 DIAGNOSIS — A4151 Sepsis due to Escherichia coli [E. coli]: Secondary | ICD-10-CM | POA: Diagnosis present

## 2017-07-17 LAB — CBC WITH DIFFERENTIAL/PLATELET
Basophils Absolute: 0.1 10*3/uL (ref 0–0.1)
Basophils Relative: 1 %
Eosinophils Absolute: 0 10*3/uL (ref 0–0.7)
Eosinophils Relative: 0 %
HCT: 37.4 % — ABNORMAL LOW (ref 40.0–52.0)
Hemoglobin: 12.6 g/dL — ABNORMAL LOW (ref 13.0–18.0)
Lymphocytes Relative: 5 %
Lymphs Abs: 1 10*3/uL (ref 1.0–3.6)
MCH: 31.3 pg (ref 26.0–34.0)
MCHC: 33.7 g/dL (ref 32.0–36.0)
MCV: 92.9 fL (ref 80.0–100.0)
Monocytes Absolute: 1.5 10*3/uL — ABNORMAL HIGH (ref 0.2–1.0)
Monocytes Relative: 7 %
Neutro Abs: 18.3 10*3/uL — ABNORMAL HIGH (ref 1.4–6.5)
Neutrophils Relative %: 87 %
Platelets: 293 10*3/uL (ref 150–440)
RBC: 4.03 MIL/uL — ABNORMAL LOW (ref 4.40–5.90)
RDW: 16.8 % — ABNORMAL HIGH (ref 11.5–14.5)
WBC: 20.9 10*3/uL — ABNORMAL HIGH (ref 3.8–10.6)

## 2017-07-17 LAB — COMPREHENSIVE METABOLIC PANEL
ALT: 24 U/L (ref 17–63)
AST: 34 U/L (ref 15–41)
Albumin: 3.7 g/dL (ref 3.5–5.0)
Alkaline Phosphatase: 70 U/L (ref 38–126)
Anion gap: 9 (ref 5–15)
BUN: 20 mg/dL (ref 6–20)
CO2: 28 mmol/L (ref 22–32)
Calcium: 8.9 mg/dL (ref 8.9–10.3)
Chloride: 99 mmol/L — ABNORMAL LOW (ref 101–111)
Creatinine, Ser: 1.25 mg/dL — ABNORMAL HIGH (ref 0.61–1.24)
GFR calc Af Amer: >60 mL/min (ref >60)
GFR calc non Af Amer: 52 mL/min — ABNORMAL LOW (ref >60)
Glucose, Bld: 148 mg/dL — ABNORMAL HIGH (ref 65–99)
Potassium: 3.8 mmol/L (ref 3.5–5.1)
Sodium: 136 mmol/L (ref 135–145)
Total Bilirubin: 1.5 mg/dL — ABNORMAL HIGH (ref 0.3–1.2)
Total Protein: 6.8 g/dL (ref 6.5–8.1)

## 2017-07-17 LAB — LACTIC ACID, PLASMA
Lactic Acid, Venous: 2.2 mmol/L (ref 0.5–1.9)
Lactic Acid, Venous: 2.2 mmol/L (ref 0.5–1.9)
Lactic Acid, Venous: 2.1 mmol/L (ref 0.5–1.9)

## 2017-07-17 LAB — URINALYSIS, COMPLETE (UACMP) WITH MICROSCOPIC
Bilirubin Urine: NEGATIVE
Glucose, UA: NEGATIVE mg/dL
Ketones, ur: NEGATIVE mg/dL
Nitrite: NEGATIVE
Protein, ur: 100 mg/dL — AB
Specific Gravity, Urine: 1.01 (ref 1.005–1.030)
Squamous Epithelial / LPF: NONE SEEN
pH: 7 (ref 5.0–8.0)

## 2017-07-17 MED ORDER — SODIUM CHLORIDE 0.9 % IV SOLN
INTRAVENOUS | Status: DC
  Administered 2017-07-17: 16:00:00 via INTRAVENOUS

## 2017-07-17 MED ORDER — DEXTROSE 5 % IV SOLN
1.0000 g | Freq: Three times a day (TID) | INTRAVENOUS | Status: DC
  Administered 2017-07-17 (×2): 1 g via INTRAVENOUS
  Filled 2017-07-17 (×4): qty 1

## 2017-07-17 MED ORDER — CHOLECALCIFEROL 10 MCG (400 UNIT) PO TABS
400.0000 [IU] | ORAL_TABLET | Freq: Every day | ORAL | Status: DC
  Administered 2017-07-17 – 2017-07-19 (×3): 400 [IU] via ORAL
  Filled 2017-07-17 (×3): qty 1

## 2017-07-17 MED ORDER — VITAMIN D2 10 MCG (400 UNIT) PO TABS
1.0000 | ORAL_TABLET | Freq: Every day | ORAL | Status: DC
  Filled 2017-07-17: qty 1

## 2017-07-17 MED ORDER — PANTOPRAZOLE SODIUM 40 MG PO TBEC
40.0000 mg | DELAYED_RELEASE_TABLET | Freq: Every day | ORAL | Status: DC
  Administered 2017-07-18 – 2017-07-19 (×2): 40 mg via ORAL
  Filled 2017-07-17 (×2): qty 1

## 2017-07-17 MED ORDER — ASPIRIN 81 MG PO CHEW
81.0000 mg | CHEWABLE_TABLET | Freq: Every day | ORAL | Status: DC
  Administered 2017-07-18 – 2017-07-19 (×2): 81 mg via ORAL
  Filled 2017-07-17 (×2): qty 1

## 2017-07-17 MED ORDER — VANCOMYCIN HCL IN DEXTROSE 1-5 GM/200ML-% IV SOLN
1000.0000 mg | Freq: Once | INTRAVENOUS | Status: AC
  Administered 2017-07-17: 1000 mg via INTRAVENOUS
  Filled 2017-07-17: qty 200

## 2017-07-17 MED ORDER — SODIUM CHLORIDE 0.9 % IV BOLUS (SEPSIS)
1000.0000 mL | Freq: Once | INTRAVENOUS | Status: AC
  Administered 2017-07-17: 1000 mL via INTRAVENOUS

## 2017-07-17 MED ORDER — POTASSIUM CHLORIDE CRYS ER 20 MEQ PO TBCR
10.0000 meq | EXTENDED_RELEASE_TABLET | Freq: Every day | ORAL | Status: DC
  Administered 2017-07-18 – 2017-07-19 (×2): 10 meq via ORAL
  Filled 2017-07-17 (×2): qty 1

## 2017-07-17 MED ORDER — ADULT MULTIVITAMIN W/MINERALS CH
1.0000 | ORAL_TABLET | Freq: Every day | ORAL | Status: DC
  Administered 2017-07-18 – 2017-07-19 (×2): 1 via ORAL
  Filled 2017-07-17 (×2): qty 1

## 2017-07-17 MED ORDER — SODIUM CHLORIDE 0.9 % IV BOLUS (SEPSIS): 1000.0000 mL | Freq: Once | INTRAVENOUS | Status: DC

## 2017-07-17 MED ORDER — VANCOMYCIN HCL 10 G IV SOLR
1750.0000 mg | INTRAVENOUS | Status: DC
  Administered 2017-07-17: 21:00:00 1750 mg via INTRAVENOUS
  Filled 2017-07-17 (×3): qty 1750

## 2017-07-17 MED ORDER — FINASTERIDE 5 MG PO TABS
5.0000 mg | ORAL_TABLET | Freq: Every day | ORAL | Status: DC
  Administered 2017-07-18 – 2017-07-19 (×2): 5 mg via ORAL
  Filled 2017-07-17 (×2): qty 1

## 2017-07-17 MED ORDER — TAMSULOSIN HCL 0.4 MG PO CAPS
0.4000 mg | ORAL_CAPSULE | Freq: Every day | ORAL | Status: DC
  Administered 2017-07-17 – 2017-07-18 (×2): 0.4 mg via ORAL
  Filled 2017-07-17 (×2): qty 1

## 2017-07-17 MED ORDER — ENOXAPARIN SODIUM 40 MG/0.4ML ~~LOC~~ SOLN
40.0000 mg | SUBCUTANEOUS | Status: DC
  Administered 2017-07-17 – 2017-07-18 (×2): 40 mg via SUBCUTANEOUS
  Filled 2017-07-17 (×2): qty 0.4

## 2017-07-17 MED ORDER — ENSURE ENLIVE PO LIQD
237.0000 mL | Freq: Two times a day (BID) | ORAL | Status: DC
  Administered 2017-07-17 – 2017-07-19 (×4): 237 mL via ORAL

## 2017-07-17 MED ORDER — PIPERACILLIN-TAZOBACTAM 3.375 G IVPB 30 MIN
3.3750 g | Freq: Once | INTRAVENOUS | Status: AC
  Administered 2017-07-17: 3.375 g via INTRAVENOUS
  Filled 2017-07-17: qty 50

## 2017-07-17 MED ORDER — DEXTROSE 5 % IV SOLN: 2.0000 g | Freq: Once | INTRAVENOUS | Status: DC

## 2017-07-17 NOTE — H&P (Signed)
[facility] [place] at [facility]   PATIENT NAME: Terry Vaughn Keener    MR#:  [id number]  DATE OF BIRTH:  08/19/1932  DATE OF ADMISSION:  07/17/2017  PRIMARY CARE PHYSICIAN: Allegra GranaArnett, [name], FNP   REQUESTING/REFERRING PHYSICIAN: Darnelle CatalanMalinda  CHIEF COMPLAINT:  fever    HISTORY OF PRESENT ILLNESS:  Terry Vaughn Westerfeld  is a 83 y.o. male with a known history of  benign prostatic hyperplasia, hypertension and other medical problems is presenting to the ED with a chief complaint of fever. Patient is reporting intermittent episodes of cough and some shortness of breath. Wife is reporting that patient had a fever of 102 last night and 101 this morning. Chest x-ray has revealed left-sided opacity and urine looks abnormal. Patient recently had left hip replacement and [facility] , also treated with antibiotics recently for infection with Escherichia coli. In the ED patient was given Zosyn for leukocytosis and elevated lactic acid at 2.2. Patient also has received 1 L of fluid bolus.   PAST MEDICAL HISTORY:   Past Medical History:  Diagnosis Date  . Bone spur 1988   left heel  . Bursitis 1987   right shoulder  . Cellulitis 1986   right leg  . Shingles 1959    PAST SURGICAL HISTOIRY:   Past Surgical History:  Procedure Laterality Date  . APPENDECTOMY  1942  . CHOLECYSTECTOMY  1966  . EYE SURGERY  2015   catarcts extracted  . MELANOMA EXCISION  04.2010   mole removal right arm  . TONSILLECTOMY AND ADENOIDECTOMY  1944  . TOTAL HIP ARTHROPLASTY  1999   right hip    SOCIAL HISTORY:   Social History   Tobacco Use  . Smoking status: Never Smoker  . Smokeless tobacco: Never Used  Substance Use Topics  . Alcohol use: No    Alcohol/week: 0.0 oz    FAMILY HISTORY:   Family History  Problem Relation Age of Onset  . Clotting disorder Mother   . Heart disease Father [id number]  . Cancer Sister        breast  . Cerebral aneurysm Brother         hemmorage  . Cancer Daughter        breast    DRUG ALLERGIES:   Allergies  Allergen Reactions  . Codeine Other (See Comments)    Alters mental state    REVIEW OF SYSTEMS:  CONSTITUTIONAL: Patient has fever and reporting generalized weakness.   EYES: No blurred or double vision.  EARS, NOSE, AND THROAT: No tinnitus or ear pain.  RESPIRATORY: Reports cough, shortness of breath  from yesterday, denies wheezing or hemoptysis.  CARDIOVASCULAR: No chest pain, orthopnea, edema.  GASTROINTESTINAL: No nausea, vomiting, diarrhea or abdominal pain.  GENITOURINARY: No dysuria, hematuria.  ENDOCRINE: No polyuria, nocturia,  HEMATOLOGY: No anemia, easy bruising or bleeding SKIN: No rash or lesion. MUSCULOSKELETAL: No joint pain or arthritis.   NEUROLOGIC: No tingling, numbness, weakness.  PSYCHIATRY: No anxiety or depression.   MEDICATIONS AT HOME:   Prior to Admission medications   Medication Sig Start Date End Date Taking? Authorizing Provider  aspirin 81 MG chewable tablet Chew 81 mg by mouth daily.   Yes [provider]  Ergocalciferol (VITAMIN D2) 400 units TABS Take 1 tablet by mouth daily.   Yes [provider]  finasteride (PROSCAR) 5 MG tablet Take 1 tablet (5 mg total) by mouth daily. 07/15/17  Yes Sherlene Shamsullo, Teresa L, MD  Infant  Care Products (DERMACLOUD) CREA Apply topically.   Yes [provider]  Multiple Vitamins-Minerals (CENTRUM SILVER ULTRA MENS) TABS Take 1 each by mouth daily.   Yes [provider]  potassium chloride (MICRO-K) 10 MEQ CR capsule Take 1 capsule (10 mEq total) by mouth daily. 07/15/17  Yes Sherlene Shams, MD  tamsulosin (FLOMAX) 0.4 MG CAPS capsule Take 1 capsule (0.4 mg total) by mouth at bedtime. 07/15/17  Yes Sherlene Shams, MD  torsemide (DEMADEX) 5 MG tablet Take 1 tablet (5 mg total) by mouth daily. 07/15/17  Yes Sherlene Shams, MD  feeding supplement, ENSURE ENLIVE, (ENSURE ENLIVE) LIQD Take 237 mLs by mouth 2  (two) times daily between meals.    [provider]  omeprazole (PRILOSEC) 20 MG capsule Take 1 capsule (20 mg total) by mouth daily. Patient not taking: Reported on 07/17/2017 04/25/16   Allegra Grana, FNP      VITAL SIGNS:  Blood pressure 121/65, pulse 81, temperature 98.3 F (36.8 C), temperature source Oral, resp. rate (!) 22, height 6\' 1"  (1.854 m), weight 112.5 kg (248 lb), SpO2 100 %.  PHYSICAL EXAMINATION:  GENERAL:  83 y.o.-year-old patient lying in the bed with no acute distress.  EYES: Pupils equal, round, reactive to light and accommodation. No scleral icterus. Extraocular muscles intact.  HEENT: Head atraumatic, normocephalic. Oropharynx and nasopharynx clear.  NECK:  Supple, no jugular venous distention. No thyroid enlargement, no tenderness.  LUNGS: Coarse  breath sounds bilaterally, no wheezing, rales,rhonchi, has some crepitation. No use of accessory muscles of respiration.  CARDIOVASCULAR: S1, S2 normal. No murmurs, rubs, or gallops.  ABDOMEN: Soft, nontender, nondistended. Bowel sounds present. No organomegaly or mass.  EXTREMITIES: status post left hip replacement incisional scar is healing well . No pedal edema, cyanosis, or clubbing.  NEUROLOGIC: Cranial nerves II through XII are intact. Muscle strength at his baseline  . Sensation intact. Gait not checked.  PSYCHIATRIC: The patient is alert and oriented x 3.  SKIN: No obvious rash, lesion, or ulcer.   LABORATORY PANEL:   CBC Recent Labs  Lab 07/17/17 1021  WBC 20.9*  HGB 12.6*  HCT 37.4*  PLT 293   ------------------------------------------------------------------------------------------------------------------  Chemistries  Recent Labs  Lab 07/17/17 1021  NA 136  K 3.8  CL 99*  CO2 28  GLUCOSE 148*  BUN 20  CREATININE 1.25*  CALCIUM 8.9  AST 34  ALT 24  ALKPHOS 70  BILITOT 1.5*    ------------------------------------------------------------------------------------------------------------------  Cardiac Enzymes No results for input(s): TROPONINI in the last 168 hours. ------------------------------------------------------------------------------------------------------------------  RADIOLOGY:  Dg Chest 2 View  Result Date: 07/17/2017 CLINICAL DATA:  Fever and slight cough. EXAM: CHEST  2 VIEW COMPARISON:  Chest x-ray dated 05/20/2017. FINDINGS: Heart size and mediastinal contours are stable. Small subtle opacity at the left lung base, best seen on the AP view. Lungs otherwise clear. No pleural effusion or pneumothorax seen. IMPRESSION: Small subtle opacity at the left lung base, lingula, suspicious for small/early pneumonia. Recommend follow-up chest x-ray to ensure resolution. Electronically Signed   By: Bary Richard M.D.   On: 07/17/2017 11:11    EKG:   Orders placed or performed during the hospital encounter of 07/17/17  . ED EKG 12-Lead  . ED EKG 12-Lead  . EKG 12-Lead  . EKG 12-Lead    IMPRESSION AND PLAN:   Janthony Holleman  is a 83 y.o. male with a known history of  benign prostatic hyperplasia, hypertension and other medical problems is  presenting to the ED with a chief complaint of fever. Patient is reporting intermittent episodes of cough and some shortness of breath. Wife is reporting that patient had a fever of 102 last night and 101 this morning. Chest x-ray has revealed left-sided opacity and urine looks abnormal.   # Sepsis secondary to healthcare associated pneumonia and UTI  admit patient to MedSurg unit patient meets septic criteria with leukocytosis, fever and elevated lactic acid. Blood cultures and urine cultures were done in the ED. Patient will be on cefepime and vancomycin. He has received 1 dose of Zosyn in the ED Torsemide on hold  #Healthcare associated pneumonia Continue cefepime and vancomycin Follow lactic acid trend IV fluids  while monitoring for symptoms and signs of fluid overload  #Abnormal urinalysis Patient is on cefepime. Follow up on the urine culture and sensitivity  # BPH Continue home medication Flomax  GI and DVT prophylaxis   All the records are reviewed and case discussed with ED provider. Management plans discussed with the patient, family and they are in agreement.  CODE STATUS: fc , wife HCPOA  TOTAL TIME TAKING CARE OF THIS PATIENT: 45  minutes.   Note: This dictation was prepared with Dragon dictation along with smaller phrase technology. Any transcriptional errors that result from this process are unintentional.  Ramonita LabGouru, Achillies Buehl M.D on 07/17/2017 at 12:22 PM  Between 7am to 6pm - Pager - [phone]  After 6pm go to [email or web address] - password [email or web address] [facility]  [facility]  Office  [phone]  CC: Primary care physician; Allegra GranaArnett, [name], FNP

## 2017-07-17 NOTE — Progress Notes (Signed)
Pharmacy Antibiotic Note  Terry BoardsFrank E Vaughn is a 83 y.o. male admitted on 07/17/2017 with sepsis.  Pharmacy has been consulted for vancomycin dosing.  Plan: Vancomycin trough level 15-20 mcg/ml. Will start vancomycin 1750mg  q18h 6 hours after initial vancomycin 1g dose.  Will check serum creatinine tomorrow and dose adjust accordingly.  Will check vanc trough before fourth dose and continue to follow.    t1/2: 12.83 Ke: 0.054 Vd: 78.75  Cmin: 16 Cmax: 40   Height: 6\' 1"  (185.4 cm) Weight: 248 lb (112.5 kg) IBW/kg (Calculated) : 79.9  Temp (24hrs), Avg:98.3 F (36.8 C), Min:98.3 F (36.8 C), Max:98.3 F (36.8 C)  Recent Labs  Lab 07/15/17 1145 07/15/17 1155 07/17/17 1021  WBC  --  9.4 20.9*  CREATININE 1.04  --  1.25*  LATICACIDVEN  --   --  2.2*    Estimated Creatinine Clearance: 59.9 mL/min (A) (by C-G formula based on SCr of 1.25 mg/dL (H)).    Allergies  Allergen Reactions  . Codeine Other (See Comments)    Alters mental state    Antimicrobials this admission: 12/21 Zosyn >> once  12/21 Vanc >>  12/21 Cefepime >>  Microbiology results: 12/21 BCx:  12/21 UCx:   21/21 Sputum:   Thank you for allowing pharmacy to be a part of this patient's care.  Cleopatra CedarStephanie Derward Marple  Pharmacy Resident  07/17/2017 1:08 PM

## 2017-07-17 NOTE — ED Provider Notes (Addendum)
[facility] Emergency Department Provider Note   ____________________________________________   First MD Initiated Contact with Patient 07/17/17 1018     (approximate)  I have reviewed the triage vital signs and the nursing notes.   HISTORY  Chief Complaint Fever    HPI Terry Vaughn is a 83 y.o. male Who comes in with his wife reporting fever of 102 last night fever 101 this morning patient took Tylenol last night 1 baby aspirin this morning's currently afebrile. Patient has a slight cough productive of small amounts of slightly yellow phlegm. This is not unusual for him. He does self catheterize. His wife noticed an odor to the urine which is new. He had a little bit of left flank achiness last night but none today. He is not having any other complaints. [place] and got cellulitis of the shaft of his penis and swollen testicle on one side renal failure and apparently lost a lot of blood from site of blood loss is not clear from the records that I seen.   Past Medical History:  Diagnosis Date  . Bone spur 1988   left heel  . Bursitis 1987   right shoulder  . Cellulitis 1986   right leg  . Shingles 1959    Patient Active Problem List   Diagnosis Date Noted  . S/P total hip arthroplasty 07/06/2017  . Primary osteoarthritis of left hip 05/27/2017  . Acute renal insufficiency   . Infected hydrocele   . Sepsis (HCC)   . Renal failure 05/20/2017  . Left hip pain 04/15/2017  . Lower limb ulcer, heel or midfoot, right, limited to breakdown of skin (HCC) 05/20/2016  . Varicose veins of leg with swelling, right 05/20/2016  . Leg swelling 05/08/2016  . Osteoarthritis of knee 05/08/2016  . Achilles tendinitis 03/06/2016  . Pain in left foot 03/06/2016  . Cellulitis of toe of right foot 12/14/2015  . Toe ulcer, right (HCC) 12/14/2015  . Shingles 02/14/2013  . Encounter to establish care 01/28/2012  . Elevated  bilirubin 01/28/2012  . Hypertriglyceridemia 01/28/2012  . Hypertension 12/12/2011  . Edema 12/12/2011  . Obesity 12/12/2011    Past Surgical History:  Procedure Laterality Date  . APPENDECTOMY  1942  . CHOLECYSTECTOMY  1966  . EYE SURGERY  2015   catarcts extracted  . MELANOMA EXCISION  04.2010   mole removal right arm  . TONSILLECTOMY AND ADENOIDECTOMY  1944  . TOTAL HIP ARTHROPLASTY  1999   right hip    Prior to Admission medications   Medication Sig Start Date End Date Taking? Authorizing Provider  aspirin 81 MG chewable tablet Chew 81 mg by mouth daily.    [provider]  Ergocalciferol (VITAMIN D2) 400 units TABS Take 1 tablet by mouth daily.    [provider]  feeding supplement, ENSURE ENLIVE, (ENSURE ENLIVE) LIQD Take 237 mLs by mouth 2 (two) times daily between meals.    [provider]  finasteride (PROSCAR) 5 MG tablet Take 1 tablet (5 mg total) by mouth daily. 07/15/17   Sherlene Shamsullo, Teresa L, MD  Infant Care Products [facility](DERMACLOUD) CREA Apply topically.    [provider]  Multiple Vitamins-Minerals (CENTRUM SILVER ULTRA MENS) TABS Take 1 each by mouth daily.    [provider]  omeprazole (PRILOSEC) 20 MG capsule Take 1 capsule (20 mg total) by mouth daily. 04/25/16   Allegra GranaArnett, [name], FNP  potassium chloride (MICRO-K) 10 MEQ  CR capsule Take 1 capsule (10 mEq total) by mouth daily. 07/15/17   Sherlene Shams, MD  tamsulosin (FLOMAX) 0.4 MG CAPS capsule Take 1 capsule (0.4 mg total) by mouth at bedtime. 07/15/17   Sherlene Shams, MD  torsemide (DEMADEX) 5 MG tablet Take 1 tablet (5 mg total) by mouth daily. 07/15/17   Sherlene Shams, MD    Allergies Codeine  Family History  Problem Relation Age of Onset  . Clotting disorder Mother   . Heart disease Father 59  . Cancer Sister        breast  . Cerebral aneurysm Brother        hemmorage  . Cancer Daughter        breast    Social History Social History   Tobacco  Use  . Smoking status: Never Smoker  . Smokeless tobacco: Never Used  Substance Use Topics  . Alcohol use: No    Alcohol/week: 0.0 oz  . Drug use: No    Review of Systems  Constitutional:  fever/chills Eyes: No visual changes. ENT: No sore throat. Cardiovascular: Denies chest pain. Respiratory: Denies shortness of breath. Gastrointestinal: No abdominal pain.  No nausea, no vomiting.  No diarrhea.  No constipation. Genitourinary: Negative for dysuria. Musculoskeletal: Negative for back pain. Skin: Negative for rash. Neurological: Negative for headaches, focal weakness    ____________________________________________   PHYSICAL EXAM:  VITAL SIGNS: ED Triage Vitals [07/17/17 1014]  Enc Vitals Group     BP (!) 151/62     Pulse Rate 94     Resp 16     Temp 98.3 F (36.8 C)     Temp Source Oral     SpO2 94 %     Weight 248 lb (112.5 kg)     Height 6\' 1"  (1.854 m)     Head Circumference      Peak Flow      Pain Score      Pain Loc      Pain Edu?      Excl. in GC?     Constitutional: Alert and oriented. Well appearing and in no acute distress. Eyes: Conjunctivae are normal.  Head: Atraumatic. Nose: No congestion/rhinnorhea. Mouth/Throat: Mucous membranes are moist.  Oropharynx non-erythematous. Neck: No stridor.   Cardiovascular: Normal rate, regular rhythm. Grossly normal heart sounds.  Good peripheral circulation. Respiratory: Normal respiratory effort.  No retractions. Lungs CTAB. Gastrointestinal: Soft and nontender. No distention. No abdominal bruits. No CVA tenderness. Musculoskeletal: No lower extremity tenderness nor edema.  No joint effusions. Neurologic:  Normal speech and language. No gross focal neurologic deficits are appreciated. No gait instability. Skin:  Skin is warm, dry and intact. No rash noted. Psychiatric: Mood and affect are normal. Speech and behavior are normal.   ____________________________________________   LABS (all labs ordered are  listed, but only abnormal results are displayed)  Labs Reviewed  URINALYSIS, COMPLETE (UACMP) WITH MICROSCOPIC - Abnormal; Notable for the following components:      Result Value   Color, Urine YELLOW (*)    APPearance HAZY (*)    Hgb urine dipstick SMALL (*)    Protein, ur 100 (*)    Leukocytes, UA LARGE (*)    Bacteria, UA RARE (*)    All other components within normal limits  COMPREHENSIVE METABOLIC PANEL - Abnormal; Notable for the following components:   Chloride 99 (*)    Glucose, Bld 148 (*)    Creatinine, Ser 1.25 (*)  Total Bilirubin 1.5 (*)    GFR calc non Af Amer 52 (*)    All other components within normal limits  CBC WITH DIFFERENTIAL/PLATELET - Abnormal; Notable for the following components:   WBC 20.9 (*)    RBC 4.03 (*)    Hemoglobin 12.6 (*)    HCT 37.4 (*)    RDW 16.8 (*)    Neutro Abs 18.3 (*)    Monocytes Absolute 1.5 (*)    All other components within normal limits  LACTIC ACID, PLASMA - Abnormal; Notable for the following components:   Lactic Acid, Venous 2.2 (*)    All other components within normal limits  URINE CULTURE  CULTURE, BLOOD (ROUTINE X 2)  CULTURE, BLOOD (ROUTINE X 2)  LACTIC ACID, PLASMA  I-STAT CG4 LACTIC ACID, ED  I-STAT CG4 LACTIC ACID, ED   ____________________________________________  EKG  EKG read and interpreted by me shows normal sinus rhythm rate of 82 difficult to telemetry axis but appears to be normal there is right bundle branch block and left posterior hemiblock. I do not see any obvious new EKG and ST-T changes on EKG the baseline is poor and the complexes are very small. ____________________________________________  RADIOLOGY  chest x-ray shows what appears to be a small pneumonia ____________________________________________   PROCEDURES  Procedure(s) performed:   Procedures  Critical Care performed:  ____________________________________________   INITIAL IMPRESSION / ASSESSMENT AND PLAN / ED  COURSE  hospital records and care everywhere were reviewed.  ____________________________________________   FINAL CLINICAL IMPRESSION(S) / ED DIAGNOSES   patient looks well however his white count is 20,000 and his lactic acid is elevated he has both a UTI and what appears to be a pneumonia with symptoms consistent with pneumonia he meets criteria for sepsis we'll admit him to the hospital for IV antibiotics for sepsis I will give him fluids gradually as he does have edema already and I do not want to put him into congestive heart failure. Final diagnoses:  Sepsis, due to unspecified organism [facility](HCC)  Urinary tract infection without hematuria, site unspecified     ED Discharge Orders    None       Note:  This document was prepared using Dragon voice recognition software and may include unintentional dictation errors.    Arnaldo NatalMalinda, Paul F, MD 07/17/17 1129    Arnaldo NatalMalinda, Paul F, MD 07/17/17 1132    Arnaldo NatalMalinda, Paul F, MD 07/17/17 [phone]

## 2017-07-17 NOTE — ED Triage Notes (Signed)
Pt here concerned for UTI. Has been doing self caths, sees Dr. Apolinar JunesBrandon. Reports fevers. Afebrile here in triage. No pain but wife has noticed odor.

## 2017-07-17 NOTE — Progress Notes (Signed)
CODE SEPSIS - PHARMACY COMMUNICATION  **Broad Spectrum Antibiotics should be administered within 1 hour of Sepsis diagnosis**  Time Code Sepsis Called/Page Received: 1230   Antibiotics Ordered: Vancomycin and Zosyn   Time of 1st antibiotic administration: Zosyn @ 1134   Additional action taken by pharmacy: N/A   If necessary, Name of Provider/Nurse Contacted: N/A    Terry CedarStephanie Edwardo Wojnarowski ,PharmD Clinical Pharmacist Resident  07/17/2017  1:20 PM

## 2017-07-18 DIAGNOSIS — N401 Enlarged prostate with lower urinary tract symptoms: Secondary | ICD-10-CM

## 2017-07-18 DIAGNOSIS — N138 Other obstructive and reflux uropathy: Secondary | ICD-10-CM | POA: Insufficient documentation

## 2017-07-18 DIAGNOSIS — A0472 Enterocolitis due to Clostridium difficile, not specified as recurrent: Secondary | ICD-10-CM | POA: Insufficient documentation

## 2017-07-18 LAB — BLOOD CULTURE ID PANEL (REFLEXED)

## 2017-07-18 LAB — STREP PNEUMONIAE URINARY ANTIGEN: Strep Pneumo Urinary Antigen: NEGATIVE

## 2017-07-18 LAB — CREATININE, SERUM
Creatinine, Ser: 1.24 mg/dL (ref 0.61–1.24)
GFR calc Af Amer: >60 mL/min (ref >60)
GFR calc non Af Amer: 52 mL/min — ABNORMAL LOW (ref >60)

## 2017-07-18 LAB — MRSA PCR SCREENING: MRSA by PCR: NEGATIVE

## 2017-07-18 MED ORDER — SODIUM CHLORIDE 0.9 % IV SOLN
1.0000 g | Freq: Three times a day (TID) | INTRAVENOUS | Status: DC
  Administered 2017-07-18 – 2017-07-19 (×4): 1 g via INTRAVENOUS
  Filled 2017-07-18 (×7): qty 1

## 2017-07-18 NOTE — Plan of Care (Signed)
  Progressing Activity: Ability to tolerate increased activity will improve 07/18/2017 1953 - Progressing by Donnel SaxonKennedy, [place] Crance L, RN Clinical Measurements: Ability to maintain a body temperature in the normal range will improve 07/18/2017 1953 - Progressing by Donnel SaxonKennedy, Nyah Shepherd L, RN Respiratory: Ability to maintain adequate ventilation will improve 07/18/2017 1953 - Progressing by Donnel SaxonKennedy, Christiann Hagerty L, RN Ability to maintain a clear airway will improve 07/18/2017 1953 - Progressing by Donnel SaxonKennedy, Antionne Enrique L, RN Urinary Elimination: Signs and symptoms of infection will decrease 07/18/2017 1953 - Progressing by Donnel SaxonKennedy, Nancyann Cotterman L, RN Education: Knowledge of General Education information will improve 07/18/2017 1953 - Progressing by Donnel SaxonKennedy, Monterrius Cardosa L, RN Health Behavior/Discharge Planning: Ability to manage health-related needs will improve 07/18/2017 1953 - Progressing by Donnel SaxonKennedy, Lauris Serviss L, RN Clinical Measurements: Ability to maintain clinical measurements within normal limits will improve 07/18/2017 1953 - Progressing by Donnel SaxonKennedy, Dystany Duffy L, RN Will remain free from infection 07/18/2017 1953 - Progressing by Donnel SaxonKennedy, Arlen Legendre L, RN Diagnostic test results will improve 07/18/2017 1953 - Progressing by Donnel SaxonKennedy, Braelynne Garinger L, RN Respiratory complications will improve 07/18/2017 1953 - Progressing by Donnel SaxonKennedy, Celestine Bougie L, RN Cardiovascular complication will be avoided 07/18/2017 1953 - Progressing by Donnel SaxonKennedy, Darby Shadwick L, RN

## 2017-07-18 NOTE — Progress Notes (Signed)
[facility] [place] NAME: Terry Vaughn    MR#:  [id number]  DATE OF BIRTH:  Dec 22, 1932  SUBJECTIVE:   Patient here due to fever, elevated lactic acid and sepsis secondary to suspected UTI and pneumonia. Feels a lot better today. No fever today. No upper respiratory symptoms. Blood cultures are positive for Escherichia coli with sensitivities pending.  REVIEW OF SYSTEMS:    Review of Systems  Constitutional: Negative for chills and fever.  HENT: Negative for congestion and tinnitus.   Eyes: Negative for blurred vision and double vision.  Respiratory: Negative for cough, shortness of breath and wheezing.   Cardiovascular: Negative for chest pain, orthopnea and PND.  Gastrointestinal: Negative for abdominal pain, diarrhea, nausea and vomiting.  Genitourinary: Negative for dysuria and hematuria.  Neurological: Negative for dizziness, sensory change and focal weakness.  All other systems reviewed and are negative.   Nutrition: Heart Healthy Tolerating Diet: Yes Tolerating PT: Await Eval.   DRUG ALLERGIES:   Allergies  Allergen Reactions  . Codeine Other (See Comments)    Alters mental state    VITALS:  Blood pressure 134/63, pulse 83, temperature 98.2 F (36.8 C), temperature source Oral, resp. rate 18, height _0  (1.854 m), weight 113.7 kg (250 lb 9.6 oz), SpO2 97 %.  PHYSICAL EXAMINATION:   Physical Exam  GENERAL:  83 y.o.-year-old patient sitting up in chair in no acute distress.  EYES: Pupils equal, round, reactive to light and accommodation. No scleral icterus. Extraocular muscles intact.  HEENT: Head atraumatic, normocephalic. Oropharynx and nasopharynx clear.  NECK:  Supple, no jugular venous distention. No thyroid enlargement, no tenderness.  LUNGS: Normal breath sounds bilaterally, no wheezing, rales, rhonchi. No use of accessory muscles of respiration.  CARDIOVASCULAR: S1, S2 normal. No murmurs, rubs, or gallops.   ABDOMEN: Soft, nontender, nondistended. Bowel sounds present. No organomegaly or mass.  EXTREMITIES: No cyanosis, clubbing or edema b/l.    NEUROLOGIC: Cranial nerves II through XII are intact. No focal Motor or sensory deficits b/l. Globally weak.    PSYCHIATRIC: The patient is alert and oriented x 3.  SKIN: No obvious rash, lesion, or ulcer.    LABORATORY PANEL:   CBC Recent Labs  Lab 07/17/17 1021  WBC 20.9*  HGB 12.6*  HCT 37.4*  PLT 293   ------------------------------------------------------------------------------------------------------------------  Chemistries  Recent Labs  Lab 07/17/17 1021 07/18/17 0541  NA 136  --   K 3.8  --   CL 99*  --   CO2 28  --   GLUCOSE 148*  --   BUN 20  --   CREATININE 1.25* 1.24  CALCIUM 8.9  --   AST 34  --   ALT 24  --   ALKPHOS 70  --   BILITOT 1.5*  --    ------------------------------------------------------------------------------------------------------------------  Cardiac Enzymes No results for input(s): TROPONINI in the last 168 hours. ------------------------------------------------------------------------------------------------------------------  RADIOLOGY:  Dg Chest 2 View  Result Date: 07/17/2017 CLINICAL DATA:  Fever and slight cough. EXAM: CHEST  2 VIEW COMPARISON:  Chest x-ray dated 05/20/2017. FINDINGS: Heart size and mediastinal contours are stable. Small subtle opacity at the left lung base, best seen on the AP view. Lungs otherwise clear. No pleural effusion or pneumothorax seen. IMPRESSION: Small subtle opacity at the left lung base, lingula, suspicious for small/early pneumonia. Recommend follow-up chest x-ray to ensure resolution. Electronically Signed   By: Franki Cabot M.D.   On: 07/17/2017 11:11  ASSESSMENT AND PLAN:   83 year old male with past medical history of shingles, previous history of bursitis, recent left hip replacement, history of urinary retention, BPH who presented to the  hospital due to fever, shortness of breath and cough.  1. Sepsis-patient met criteria admission given his leukocytosis, fever and a positive chest x-ray for pneumonia and also urinalysis positive for UTI. -Continue IV meropenem, patient's blood cultures are growing Escherichia coli but sensitivities pending. MRSA PCR was negative therefore vancomycin discontinued.  2. Urinary tract infection-this is a source of patient's sepsis. -Continue IV meropenem, follow urine cultures, blood cultures positive for Escherichia coli but sensitivities pending.  3. Pneumonia-this was based on a chest x-ray finding on admission. Clinically patient has some mild shortness of breath and cough.  -MRSA PCR was negative therefore vancomycin discontinued. Continue IV meropenem for now.  4. Leukocytosis-secondary to the sepsis and UTI. -Follow white cell count with IV abx. Therapy.  5. BPH with urinary retention-continue in and out catheter 4 times a day. -Continue Proscar, Flomax.  5. GERD-continue Protonix.   All the records are reviewed and case discussed with Care Management/Social Worker. Management plans discussed with the patient, family and they are in agreement.  CODE STATUS: Full code  DVT Prophylaxis: Lovenox  TOTAL TIME TAKING CARE OF THIS PATIENT: 30 minutes.   POSSIBLE D/C IN 1-2 DAYS, DEPENDING ON CLINICAL CONDITION.   Henreitta Leber M.D on 07/18/2017 at 12:55 PM  Between 7am to 6pm - Pager - [phone]  After 6pm go to [email or web address] - Technical brewer [facility]  Office  [phone]  CC: Primary care physician; Burnard Hawthorne, FNP

## 2017-07-18 NOTE — Assessment & Plan Note (Signed)
Lab Results  Component Value Date   CHOL 135 04/15/2017   HDL 48.50 04/15/2017   LDLCALC 69 04/15/2017   TRIG 86.0 04/15/2017   CHOLHDL 3 04/15/2017

## 2017-07-18 NOTE — Progress Notes (Signed)
PHARMACY - PHYSICIAN COMMUNICATION CRITICAL VALUE ALERT - BLOOD CULTURE IDENTIFICATION (BCID)  Terry Vaughn is an 83 y.o. male who [facility]2017 with a chief complaint of fever  Assessment:  Tachycardic, tachypneic, WBC 20.9, GNR BCID E. Coli KPC -, early PNA on CXR  (include suspected source if known)  Name of physician (or Provider) Contacted: Pavan Pyreddy  Current antibiotics: cefepime, vancomycin--recommend to switch cefepime to meropenem for possible E. Coli ESBL  Changes to prescribed antibiotics recommended:  Recommendations accepted by provider  Results for orders placed or performed during the hospital encounter of 07/17/17  Blood Culture ID Panel (Reflexed) (Collected: 07/17/2017 10:30 AM)  Result Value Ref Range   Enterococcus species NOT DETECTED NOT DETECTED   Listeria monocytogenes NOT DETECTED NOT DETECTED   Staphylococcus species NOT DETECTED NOT DETECTED   Staphylococcus aureus NOT DETECTED NOT DETECTED   Streptococcus species NOT DETECTED NOT DETECTED   Streptococcus agalactiae NOT DETECTED NOT DETECTED   Streptococcus pneumoniae NOT DETECTED NOT DETECTED   Streptococcus pyogenes NOT DETECTED NOT DETECTED   Acinetobacter baumannii NOT DETECTED NOT DETECTED   Enterobacteriaceae species DETECTED (A) NOT DETECTED   Enterobacter cloacae complex NOT DETECTED NOT DETECTED   Escherichia coli DETECTED (A) NOT DETECTED   Klebsiella oxytoca NOT DETECTED NOT DETECTED   Klebsiella pneumoniae NOT DETECTED NOT DETECTED   Proteus species NOT DETECTED NOT DETECTED   Serratia marcescens NOT DETECTED NOT DETECTED   Carbapenem resistance NOT DETECTED NOT DETECTED   Haemophilus influenzae NOT DETECTED NOT DETECTED   Neisseria meningitidis NOT DETECTED NOT DETECTED   Pseudomonas aeruginosa NOT DETECTED NOT DETECTED   Candida albicans NOT DETECTED NOT DETECTED   Candida glabrata NOT DETECTED NOT DETECTED   Candida krusei NOT DETECTED NOT DETECTED   Candida parapsilosis NOT DETECTED NOT DETECTED   Candida tropicalis NOT DETECTED NOT DETECTED   Thomasene Rippleavid Lonia Roane, PharmD, BCPS Clinical Pharmacist 07/18/2017

## 2017-07-18 NOTE — Plan of Care (Signed)
VSS, free of falls during shift.  Denies pain.  Self-catheterized 1L urine during shift.  Bed in low position, call bell within reach.  WCTM.

## 2017-07-18 NOTE — Assessment & Plan Note (Signed)
Resolved currently.  Occurred post operatively l.  Advised to continue daily use of probiotics.

## 2017-07-18 NOTE — Assessment & Plan Note (Signed)
He has finished Ciprofloxacin course , complicated by ci dificile colitis,  Now resolved . Follow up with Urology

## 2017-07-18 NOTE — Assessment & Plan Note (Addendum)
Secondary to outlet obstruction . Cr has returned to baseline and potassium is normal using supplement and torsemide for mnagement of scrotal swelling  Lab Results  Component Value Date   CREATININE 1.24 07/18/2017   Lab Results  Component Value Date   NA 136 07/17/2017   K 3.8 07/17/2017   CL 99 (L) 07/17/2017   CO2 28 07/17/2017

## 2017-07-18 NOTE — Assessment & Plan Note (Signed)
Continue self catheterization , Flomax, and tamsulosin

## 2017-07-19 LAB — URINE CULTURE: Culture: >=100000 — AB

## 2017-07-19 LAB — CBC
HCT: 34 % — ABNORMAL LOW (ref 40.0–52.0)
Hemoglobin: 11.6 g/dL — ABNORMAL LOW (ref 13.0–18.0)
MCH: 31.4 pg (ref 26.0–34.0)
MCHC: 34.1 g/dL (ref 32.0–36.0)
MCV: 92.1 fL (ref 80.0–100.0)
Platelets: 249 10*3/uL (ref 150–440)
RBC: 3.7 MIL/uL — ABNORMAL LOW (ref 4.40–5.90)
RDW: 16.7 % — ABNORMAL HIGH (ref 11.5–14.5)
WBC: 10.1 10*3/uL (ref 3.8–10.6)

## 2017-07-19 LAB — BASIC METABOLIC PANEL
Anion gap: 7 (ref 5–15)
BUN: 23 mg/dL — ABNORMAL HIGH (ref 6–20)
CO2: 27 mmol/L (ref 22–32)
Calcium: 8.4 mg/dL — ABNORMAL LOW (ref 8.9–10.3)
Chloride: 101 mmol/L (ref 101–111)
Creatinine, Ser: 1.23 mg/dL (ref 0.61–1.24)
GFR calc Af Amer: >60 mL/min (ref >60)
GFR calc non Af Amer: 53 mL/min — ABNORMAL LOW (ref >60)
Glucose, Bld: 102 mg/dL — ABNORMAL HIGH (ref 65–99)
Potassium: 3.7 mmol/L (ref 3.5–5.1)
Sodium: 135 mmol/L (ref 135–145)

## 2017-07-19 LAB — HIV ANTIBODY (ROUTINE TESTING W REFLEX): HIV Screen 4th Generation wRfx: NONREACTIVE

## 2017-07-19 LAB — VANCOMYCIN, TROUGH: Vancomycin Tr: 7 ug/mL — ABNORMAL LOW (ref 15–20)

## 2017-07-19 MED ORDER — CEFUROXIME AXETIL 500 MG PO TABS: 500.0000 mg | ORAL_TABLET | Freq: Two times a day (BID) | ORAL | 0 refills | Status: AC

## 2017-07-19 NOTE — Discharge Summary (Signed)
Terry Vaughn at [place] NAME: Terry Vaughn    MR#:  [id number]  DATE OF BIRTH:  1932-09-30  DATE OF ADMISSION:  07/17/2017 ADMITTING PHYSICIAN: Nicholes Mango, MD  DATE OF DISCHARGE: 07/19/2017  PRIMARY CARE PHYSICIAN: Burnard Hawthorne, FNP    ADMISSION DIAGNOSIS:  Sepsis, due to unspecified organism ([place]) [A41.9] Urinary tract infection without hematuria, site unspecified [N39.0] Community acquired pneumonia, unspecified laterality [J18.9]  DISCHARGE DIAGNOSIS:  Active Problems:   Sepsis (Terry Vaughn)   SECONDARY DIAGNOSIS:   Past Medical History:  Diagnosis Date  . Bone spur 1988   left heel  . Bursitis 1987   right shoulder  . Cellulitis 1986   right leg  . Shingles 1959    HOSPITAL COURSE:   83 year old male with past medical history of shingles, previous history of bursitis, recent left hip replacement, history of urinary retention, BPH who presented to the hospital due to fever, shortness of breath and cough.  1. Sepsis-patient met criteria on admission given his leukocytosis, fever and a positive chest x-ray for pneumonia and also urinalysis positive for UTI. -Initially patient was treated with broad-spectrum IV antibiotics with vancomycin, meropenem. Patient's urine cultures are positive for Escherichia coli which was pansensitive. Blood cultures also positive for Escherichia coli. -Vancomycin was discontinued as MRSA PCR was negative. Patient is now being discharged on oral Ceftin for additional 12 days to treat his Escherichia coli bacteremia. -Patient is clinically afebrile and hemodynamically stable now.  2. Urinary tract infection-this was a source of patient's sepsis. -Patient was treated with IV meropenem but urine cultures came back positive for Escherichia coli which was pansensitive and therefore patient is now being discharged on oral Ceftin for additional 12 days.  3. Pneumonia-this was based on a chest x-ray finding  on admission. Clinically patient has some mild shortness of breath and cough. This has now been ruled out. Pt. Discharged on abx to treated UTI w/ sepsis rather than pneumonia  4. Leukocytosis-secondary to the sepsis and UTI. - normalized with IV abx.   5. BPH with urinary retention-patient will continue in and out catheterization 4 times a day as per urology. Patient will follow-up with Dr. Erlene Quan.. - he will Continue Proscar, Flomax.   DISCHARGE CONDITIONS:   Stable.  CONSULTS OBTAINED:    DRUG ALLERGIES:   Allergies  Allergen Reactions  . Codeine Other (See Comments)    Alters mental state    DISCHARGE MEDICATIONS:   Allergies as of 07/19/2017      Reactions   Codeine Other (See Comments)   Alters mental state      Medication List    STOP taking these medications   omeprazole 20 MG capsule Commonly known as:  PRILOSEC     TAKE these medications   aspirin 81 MG chewable tablet Chew 81 mg by mouth daily.   cefUROXime 500 MG tablet Commonly known as:  CEFTIN Take 1 tablet (500 mg total) by mouth 2 (two) times daily with a meal for 12 days.   CENTRUM SILVER ULTRA MENS Tabs Take 1 each by mouth daily.   DERMACLOUD Crea Apply topically.   feeding supplement (ENSURE ENLIVE) Liqd Take 237 mLs by mouth 2 (two) times daily between meals.   finasteride 5 MG tablet Commonly known as:  PROSCAR Take 1 tablet (5 mg total) by mouth daily.   potassium chloride 10 MEQ CR capsule Commonly known as:  MICRO-K Take 1 capsule (10 mEq total) by mouth daily.  tamsulosin 0.4 MG Caps capsule Commonly known as:  FLOMAX Take 1 capsule (0.4 mg total) by mouth at bedtime.   torsemide 5 MG tablet Commonly known as:  DEMADEX Take 1 tablet (5 mg total) by mouth daily.   Vitamin D2 400 units Tabs Take 1 tablet by mouth daily.         DISCHARGE INSTRUCTIONS:   DIET:  Regular diet  DISCHARGE CONDITION:  Stable  ACTIVITY:  Activity as tolerated  OXYGEN:   Home Oxygen: No.   Oxygen Delivery: room air  DISCHARGE LOCATION:  home   If you experience worsening of your admission symptoms, develop shortness of breath, life threatening emergency, suicidal or homicidal thoughts you must seek medical attention immediately by calling 911 or calling your MD immediately  if symptoms less severe.  You Must read complete instructions/literature along with all the possible adverse reactions/side effects for all the Medicines you take and that have been prescribed to you. Take any new Medicines after you have completely understood and accpet all the possible adverse reactions/side effects.   Please note  You were cared for by a hospitalist during your hospital stay. If you have any questions about your discharge medications or the care you received while you were in the hospital after you are discharged, you can call the unit and asked to speak with the hospitalist on call if the hospitalist that took care of you is not available. Once you are discharged, your primary care physician will handle any further medical issues. Please note that NO REFILLS for any discharge medications will be authorized once you are discharged, as it is imperative that you return to your primary care physician (or establish a relationship with a primary care physician if you do not have one) for your aftercare needs so that they can reassess your need for medications and monitor your lab values.     Today   Feels much better. White cell count normalizes, afebrile, hemodynamically stable. Sitting up in chair and eating breakfast. No other acute events overnight. We'll discharge home on oral antibiotics today.  VITAL SIGNS:  Blood pressure 139/60, pulse 78, temperature 98.6 F (37 C), temperature source Oral, resp. rate 20, height _0  (1.854 m), weight 113.7 kg (250 lb 9.6 oz), SpO2 95 %.  I/O:    Intake/Output Summary (Last 24 hours) at 07/19/2017 1253 Last data filed at  07/19/2017 1000 Gross per 24 hour  Intake 700 ml  Output 2675 ml  Net -1975 ml    PHYSICAL EXAMINATION:   GENERAL:  83 y.o.-year-old patient sitting up in chair in no acute distress.  EYES: Pupils equal, round, reactive to light and accommodation. No scleral icterus. Extraocular muscles intact.  HEENT: Head atraumatic, normocephalic. Oropharynx and nasopharynx clear.  NECK:  Supple, no jugular venous distention. No thyroid enlargement, no tenderness.  LUNGS: Normal breath sounds bilaterally, no wheezing, rales, rhonchi. No use of accessory muscles of respiration.  CARDIOVASCULAR: S1, S2 normal. No murmurs, rubs, or gallops.  ABDOMEN: Soft, nontender, nondistended. Bowel sounds present. No organomegaly or mass.  EXTREMITIES: No cyanosis, clubbing or edema b/l.    NEUROLOGIC: Cranial nerves II through XII are intact. No focal Motor or sensory deficits b/l. Globally weak.    PSYCHIATRIC: The patient is alert and oriented x 3.  SKIN: No obvious rash, lesion, or ulcer.     DATA REVIEW:   CBC Recent Labs  Lab 07/19/17 0712  WBC 10.1  HGB 11.6*  HCT 34.0*  PLT  [place]  Lab 07/17/17 1021  07/19/17 0712  NA 136  --  135  K 3.8  --  3.7  CL 99*  --  101  CO2 28  --  27  GLUCOSE 148*  --  102*  BUN 20  --  23*  CREATININE 1.25*   < > 1.23  CALCIUM 8.9  --  8.4*  AST 34  --   --   ALT 24  --   --   ALKPHOS 70  --   --   BILITOT 1.5*  --   --    < > = values in this interval not displayed.    Cardiac Enzymes No results for input(s): TROPONINI in the last 168 hours.  Microbiology Results  Results for orders placed or performed during the hospital encounter of 07/17/17  Urine culture     Status: Abnormal   Collection Time: 07/17/17 10:20 AM  Result Value Ref Range Status   Specimen Description   Final    URINE, CATHETERIZED Performed at [facility], [address], [place], [place] [address]    Special Requests   Final     NONE Performed at [facility], [address] [place], [place] [address]    Culture >=100,000 COLONIES/mL ESCHERICHIA COLI (A)  Final   Report Status 07/19/2017 FINAL  Final   Organism ID, Bacteria ESCHERICHIA COLI (A)  Final      Susceptibility   Escherichia coli - MIC*    AMPICILLIN 4 SENSITIVE Sensitive     CEFAZOLIN <=4 SENSITIVE Sensitive     CEFTRIAXONE <=1 SENSITIVE Sensitive     CIPROFLOXACIN <=0.25 SENSITIVE Sensitive     GENTAMICIN <=1 SENSITIVE Sensitive     IMIPENEM <=0.25 SENSITIVE Sensitive     NITROFURANTOIN <=16 SENSITIVE Sensitive     TRIMETH/SULFA <=20 SENSITIVE Sensitive     AMPICILLIN/SULBACTAM <=2 SENSITIVE Sensitive     PIP/TAZO <=4 SENSITIVE Sensitive     Extended ESBL NEGATIVE Sensitive     * >=100,000 COLONIES/mL ESCHERICHIA COLI  Culture, blood (routine x 2)     Status: Abnormal (Preliminary result)   Collection Time: 07/17/17 10:30 AM  Result Value Ref Range Status   Specimen Description   Final    BLOOD LFOA Performed at [facility], [address], [place], [place] [address]    Special Requests   Final    BOTTLES DRAWN AEROBIC AND ANAEROBIC Blood Culture adequate volume Performed at [facility], [address] [place], [place] [address]    Culture  Setup Time   Final    IN BOTH AEROBIC AND ANAEROBIC BOTTLES GRAM NEGATIVE RODS CRITICAL RESULT CALLED TO, READ BACK BY AND VERIFIED WITH: DAVID BESANTI @ 0136 ON 07/18/2017 BY CAF    Culture (A)  Final    ESCHERICHIA COLI SUSCEPTIBILITIES TO FOLLOW Performed at [facility], [place] [address] [place], [place] [address]    Report Status PENDING  Incomplete  Culture, blood (routine x 2)     Status: None (Preliminary result)   Collection Time: 07/17/17 10:30 AM  Result Value Ref Range Status   Specimen Description   Final    BLOOD RFOA Performed at [facility], [address] [place], [place] [address]    Special Requests   Final    BOTTLES DRAWN  AEROBIC AND ANAEROBIC Blood Culture adequate volume Performed at [facility], [place], [place]  [address]    Culture  Setup Time   Final    GRAM NEGATIVE RODS IN BOTH AEROBIC AND ANAEROBIC BOTTLES CRITICAL VALUE NOTED.  VALUE IS CONSISTENT WITH PREVIOUSLY REPORTED AND CALLED VALUE.    Culture   Final    GRAM NEGATIVE RODS IDENTIFICATION AND SUSCEPTIBILITIES TO FOLLOW Performed at [facility], [place] [address] [place], [place] [address]    Report Status PENDING  Incomplete  Blood Culture ID Panel (Reflexed)     Status: Abnormal   Collection Time: 07/17/17 10:30 AM  Result Value Ref Range Status   Enterococcus species NOT DETECTED NOT DETECTED Final   Listeria monocytogenes NOT DETECTED NOT DETECTED Final   Staphylococcus species NOT DETECTED NOT DETECTED Final   Staphylococcus aureus NOT DETECTED NOT DETECTED Final   Streptococcus species NOT DETECTED NOT DETECTED Final   Streptococcus agalactiae NOT DETECTED NOT DETECTED Final   Streptococcus pneumoniae NOT DETECTED NOT DETECTED Final   Streptococcus pyogenes NOT DETECTED NOT DETECTED Final   Acinetobacter baumannii NOT DETECTED NOT DETECTED Final   Enterobacteriaceae species DETECTED (A) NOT DETECTED Final    Comment: Enterobacteriaceae represent a large family of gram-negative bacteria, not a single organism. CRITICAL RESULT CALLED TO, READ BACK BY AND VERIFIED WITH: DAVID BESANTI @ 0136 ON 07/18/2017 BY CAF    Enterobacter cloacae complex NOT DETECTED NOT DETECTED Final   Escherichia coli DETECTED (A) NOT DETECTED Final    Comment: CRITICAL RESULT CALLED TO, READ BACK BY AND VERIFIED WITH: DAVID BESANTI @ 0136 ON 07/18/2017 BY CAF    Klebsiella oxytoca NOT DETECTED NOT DETECTED Final   Klebsiella pneumoniae NOT DETECTED NOT DETECTED Final   Proteus species NOT DETECTED NOT DETECTED Final   Serratia marcescens NOT DETECTED NOT DETECTED Final   Carbapenem resistance NOT DETECTED NOT DETECTED  Final   Haemophilus influenzae NOT DETECTED NOT DETECTED Final   Neisseria meningitidis NOT DETECTED NOT DETECTED Final   Pseudomonas aeruginosa NOT DETECTED NOT DETECTED Final   Candida albicans NOT DETECTED NOT DETECTED Final   Candida glabrata NOT DETECTED NOT DETECTED Final   Candida krusei NOT DETECTED NOT DETECTED Final   Candida parapsilosis NOT DETECTED NOT DETECTED Final   Candida tropicalis NOT DETECTED NOT DETECTED Final    Comment: Performed at [facility], [address] [place], [place] [address]  MRSA PCR Screening     Status: None   Collection Time: 07/18/17  9:34 AM  Result Value Ref Range Status   MRSA by PCR NEGATIVE NEGATIVE Final    Comment:        The GeneXpert MRSA Assay (FDA approved for NASAL specimens only), is one component of a comprehensive MRSA colonization surveillance program. It is not intended to diagnose MRSA infection nor to guide or monitor treatment for MRSA infections. Performed at [facility], [address], [place], [place] [address]     RADIOLOGY:  No results found.    Management plans discussed with the patient, family and they are in agreement.  CODE STATUS:     Code Status Orders  (From admission, onward)        Start     Ordered   07/17/17 1301  Full code  Continuous     07/17/17 1301      TOTAL TIME TAKING CARE OF THIS PATIENT: 40 minutes.    Henreitta Leber M.D on 07/19/2017 at 12:53 PM  Between 7am to 6pm - Pager - [phone]  After 6pm go to [email or web address] -  password Airline pilot  [facility]  Office  [phone]  CC: Primary care physician; Burnard Hawthorne, FNP

## 2017-07-19 NOTE — Progress Notes (Signed)
Pt being discharged home, discharge instructions and prescriptions  reviewed with pt and wife, states understanding, pt states understanding with in and out cath education, pt with no complaints

## 2017-07-20 ENCOUNTER — Telehealth: Payer: Self-pay

## 2017-07-20 LAB — LEGIONELLA PNEUMOPHILA SEROGP 1 UR AG: L. pneumophila Serogp 1 Ur Ag: NEGATIVE

## 2017-07-20 LAB — CULTURE, BLOOD (ROUTINE X 2): Special Requests: ADEQUATE

## 2017-07-20 NOTE — Telephone Encounter (Signed)
Ok to use this slot

## 2017-07-20 NOTE — Telephone Encounter (Signed)
Copied from CRM [phone]. Topic: Appointment Scheduling - Prior Auth Required for Appointment >> Jul 20, 2017  9:22 AM Terry Vaughn, Terry Vaughn wrote: Pt's wife called and stated he was Discharged from the hospital on 12/23. Pt needs a hospital f/u. There is nothing avail for any provider until after the new year. I tried calling office 6 times (all numbers) there is an appt for 12/28 Same Day slot that I was wanting permission to use for Dr. Shirlee LatchMclean @ 3pm. Pt's wife would like a call back today if all possible @ [phone].

## 2017-07-21 LAB — CULTURE, BLOOD (ROUTINE X 2): Special Requests: ADEQUATE

## 2017-07-22 ENCOUNTER — Telehealth: Payer: Self-pay

## 2017-07-22 NOTE — Telephone Encounter (Signed)
Copied from CRM #[id number]. Topic: Appointment Scheduling - Prior Auth Required for Appointment >> Jul 20, 2017  9:22 AM Taylor, Brittany L wrote: Pt's wife called and stated he was Discharged from the hospital on 12/23. Pt needs a hospital f/u. There is nothing avail for any provider until after the new year. I tried calling office 6 times (all numbers) there is an appt for 12/28 Same Day slot that I was wanting permission to use for Dr. Mclean @ 3pm. Pt's wife would like a call back today if all possible @ [phone]. 

## 2017-07-22 NOTE — Telephone Encounter (Signed)
Transition Care Management Follow-up Telephone Call  How have you been since you were released from the hospital?  Patient seems to be getting stronger since being home from hospital, still coughing pretty much the same as in hospital. Cough clear mucus per patient wife.   Do you understand why you were in the hospital? yes   Do you understand the discharge instrcutions? yes  Items Reviewed:  Medications reviewed: yes  Allergies reviewed: yes  Dietary changes reviewed: yes  Referrals reviewed: yes   Functional Questionnaire:   Activities of Daily Living (ADLs):   He states they are independent in the following: ambulation, bathing and hygiene, feeding, continence, grooming, toileting and dressing States they require assistance with the following: Walks with walker and wife helps with self catherization.   Any transportation issues/concerns?: no   Any patient concerns? no   Confirmed importance and date/time of follow-up visits scheduled: Yes   Confirmed with patient if condition begins to worsen call PCP or go to the ER.  Patient was given the Call-a-Nurse line [phone]: Yes

## 2017-07-22 NOTE — Telephone Encounter (Signed)
Today is my 1/2 day tomorrow and Friday I look booked but if you all thiink you can find a spot with enough time to see all of my patients and hospital f/u (which I think is 30 minutes) for the day ok to book only if time available otherwise soonest next week if time available   Thanks TMS

## 2017-07-22 NOTE — Telephone Encounter (Signed)
Tried to contact Patient for TCM no answer, due to line busy will contact later today.

## 2017-07-22 NOTE — Telephone Encounter (Signed)
Patient scheduled.

## 2017-07-22 NOTE — Telephone Encounter (Signed)
Patient has been scheduled for 1.7.19.

## 2017-07-27 ENCOUNTER — Telehealth: Payer: Self-pay | Admitting: Family

## 2017-07-27 NOTE — Telephone Encounter (Signed)
LMTCB

## 2017-07-27 NOTE — Telephone Encounter (Signed)
Copied from CRM [phone]. Topic: Quick Communication - See Telephone Encounter >> Jul 27, 2017 11:10 AM Terry Vaughn, Rosey Batheresa D wrote: CRM for notification. See Telephone encounter for: 07/27/17. Will with [facility] called and would like a verbal order for occupational therapy for 1X for 1 week and 2X for 2 weeks. He can be reached at [phone].

## 2017-07-27 NOTE — Telephone Encounter (Signed)
Will called back but will reach out Wednesday for the orders, but the practice can reach back out as well to give verbals

## 2017-07-29 ENCOUNTER — Ambulatory Visit (INDEPENDENT_AMBULATORY_CARE_PROVIDER_SITE_OTHER): Payer: Medicare Other | Admitting: Urology

## 2017-07-29 ENCOUNTER — Encounter: Payer: Self-pay | Admitting: Urology

## 2017-07-29 VITALS — BP 151/67 | HR 94 | Ht 73.0 in | Wt 248.0 lb

## 2017-07-29 DIAGNOSIS — R7881 Bacteremia: Secondary | ICD-10-CM

## 2017-07-29 DIAGNOSIS — R339 Retention of urine, unspecified: Secondary | ICD-10-CM

## 2017-07-29 DIAGNOSIS — N451 Epididymitis: Secondary | ICD-10-CM | POA: Diagnosis not present

## 2017-07-29 NOTE — Progress Notes (Signed)
07/29/2017 10:30 AM   Terry Vaughn 12/27/1932 [id number]  Referring provider: Allegra GranaArnett, [name], Vaughn [address] [place], [place] [id number]  Chief Complaint  Patient presents with  . Urinary Retention    HPI: 84 year old male who returns sooner than expected after being scheduled follow-up after recent inpatient admission for UTI/E. coli bacteremia and pneumonia.  He was discharged on 07/19/2017 initially on broad-spectrum antibiotics ultimately discharged home on oral Ceftin to complete a full 2-week course.  He has 2 additional doses.  He reports that he is feeling well.  He initially developed fevers to 102 and weakness.    He has been able to self cath 4 times daily.  Over the past 2 days, he started to void some spontaneously but only in small amounts.  No issues with self cath.  He and his wife do report that they catheterized for up to 1 L on occasion.  He does report palpating a "lump" in his left groin area a few days ago.  This is remained relatively stable in size.  He does have a personal history of left epididymal orchitis.   PMH: Past Medical History:  Diagnosis Date  . Bone spur 1988   left heel  . Bursitis 1987   right shoulder  . Cellulitis 1986   right leg  . Shingles 1959    Surgical History: Past Surgical History:  Procedure Laterality Date  . APPENDECTOMY  1942  . CHOLECYSTECTOMY  1966  . EYE SURGERY  2015   catarcts extracted  . MELANOMA EXCISION  04.2010   mole removal right arm  . TONSILLECTOMY AND ADENOIDECTOMY  1944  . TOTAL HIP ARTHROPLASTY  1999   right hip    Home Medications:  Allergies as of 07/29/2017      Reactions   Codeine Other (See Comments)   Alters mental state      Medication List        Accurate as of 07/29/17 10:30 AM. Always use your most recent med list.          aspirin 81 MG chewable tablet Chew 81 mg by mouth daily.   cefUROXime 500 MG tablet Commonly known as:  CEFTIN Take 1 tablet (500  mg total) by mouth 2 (two) times daily with a meal for 12 days.   CENTRUM SILVER ULTRA MENS Tabs Take 1 each by mouth daily.   DERMACLOUD Crea Apply topically.   feeding supplement (ENSURE ENLIVE) Liqd Take 237 mLs by mouth 2 (two) times daily between meals.   finasteride 5 MG tablet Commonly known as:  PROSCAR Take 1 tablet (5 mg total) by mouth daily.   potassium chloride 10 MEQ CR capsule Commonly known as:  MICRO-K Take 1 capsule (10 mEq total) by mouth daily.   tamsulosin 0.4 MG Caps capsule Commonly known as:  FLOMAX Take 1 capsule (0.4 mg total) by mouth at bedtime.   torsemide 5 MG tablet Commonly known as:  DEMADEX Take 1 tablet (5 mg total) by mouth daily.   Vitamin D2 400 units Tabs Take 1 tablet by mouth daily.       Allergies:  Allergies  Allergen Reactions  . Codeine Other (See Comments)    Alters mental state    Family History: Family History  Problem Relation Age of Onset  . Clotting disorder Mother   . Heart disease Father [phone]  . Cancer Sister        breast  . Cerebral aneurysm Brother  hemmorage  . Cancer Daughter        breast    Social History:  reports that  has never smoked. he has never used smokeless tobacco. He reports that he does not drink alcohol or use drugs.  ROS: UROLOGY Frequent Urination?: No Hard to postpone urination?: No Burning/pain with urination?: No Get up at night to urinate?: No Leakage of urine?: No Urine stream starts and stops?: No Trouble starting stream?: No Do you have to strain to urinate?: No Blood in urine?: No Urinary tract infection?: No Sexually transmitted disease?: No Injury to kidneys or bladder?: No Painful intercourse?: No Weak stream?: No Erection problems?: No Penile pain?: No  Gastrointestinal Nausea?: No Vomiting?: No Indigestion/heartburn?: No Diarrhea?: No Constipation?: No  Constitutional Fever: No Night sweats?: No Weight loss?: No Fatigue?: No  Skin Skin  rash/lesions?: No Itching?: No  Eyes Blurred vision?: No Double vision?: No  Ears/Nose/Throat Sore throat?: No Sinus problems?: No  Hematologic/Lymphatic Swollen glands?: No Easy bruising?: No  Cardiovascular Leg swelling?: No Chest pain?: No  Respiratory Cough?: No Shortness of breath?: No  Endocrine Excessive thirst?: No  Musculoskeletal Back pain?: No Joint pain?: No  Neurological Headaches?: No Dizziness?: No  Psychologic Depression?: No Anxiety?: No  Physical Exam: BP (!) 151/67   Pulse 94   Ht 6\' 1"  (1.854 m)   Wt 248 lb (112.5 kg)   BMI 32.72 kg/m   Constitutional:  Alert and oriented, No acute distress.  Accompanied by wife.  Ambulating slowly with walker. HEENT: [place] AT, moist mucus membranes.  Trachea midline, no masses. Cardiovascular: No clubbing, cyanosis, or edema. Respiratory: Normal respiratory effort, no increased work of breathing. GI: Abdomen is soft, nontender, nondistended, no abdominal masses GU: Circumcised phallus with orthotopic meatus.  Bilateral descended testicles, left testicle slightly more enlarged and tender than the right.  Thickened left cord without overlying induration or skin changes.  No fluctuance or crepitus. Skin: No rashes, bruises or suspicious lesions. Neurologic: Grossly intact, no focal deficits, moving all 4 extremities. Psychiatric: Normal mood and affect.  Laboratory Data: Lab Results  Component Value Date   WBC 10.1 07/19/2017   HGB 11.6 (L) 07/19/2017   HCT 34.0 (L) 07/19/2017   MCV 92.1 07/19/2017   PLT 249 07/19/2017    Lab Results  Component Value Date   CREATININE 1.23 07/19/2017    Lab Results  Component Value Date   HGBA1C 5.1 04/15/2017    Urinalysis Lab Results  Component Value Date   APPEARANCEUR HAZY (A) 07/17/2017   LEUKOCYTESUR LARGE (A) 07/17/2017   PROTEINUR 100 (A) 07/17/2017   GLUCOSEU NEGATIVE 07/17/2017   RBCU 6-30 07/17/2017   BILIRUBINUR NEGATIVE 07/17/2017    NITRITE NEGATIVE 07/17/2017    Lab Results  Component Value Date   BACTERIA RARE (A) 07/17/2017    Pertinent Imaging: N/a  Assessment & Plan:    1. E coli bacteremia Hospital records from inpatient admission reviewed/ UCx/ Bx 2 additional days of Ceftin Clinically improving If fever recurs or with left cord induration fails to resolve, would continue antibiotics for an additional week-they will call and let us know  2. Urinary retention Continue self cath-recommend increasing frequency of self cath to 6 times daily given relatively large volumes to avoid stretch injury to the bladder Continue Flomax and finasteride Suboptimal candidate for outlet surgery at this time  3. Left epididymitis Induration of left cord with slightly enlarged testicle, suspect he may have had a recurrence of his epididymal orchitis as  nidus for the infection Will consider additional week of antibiotic as above  Follow-up in March as scheduled  Vanna Scotland, MD  [facility] [address] [place], [place] [address] [phone]

## 2017-07-29 NOTE — Telephone Encounter (Signed)
Verbal given for OT. OK?

## 2017-07-29 NOTE — Telephone Encounter (Signed)
Thanks yes!  Always ok to give  verbals for [facility]

## 2017-07-30 ENCOUNTER — Telehealth: Payer: Self-pay

## 2017-07-30 NOTE — Telephone Encounter (Signed)
Pt has indefinite retention and caths 4x daily.

## 2017-08-03 ENCOUNTER — Encounter: Payer: Self-pay | Admitting: Internal Medicine

## 2017-08-03 ENCOUNTER — Telehealth: Payer: Self-pay | Admitting: Urology

## 2017-08-03 ENCOUNTER — Ambulatory Visit: Payer: Medicare Other | Admitting: Internal Medicine

## 2017-08-03 VITALS — BP 130/64 | HR 86 | Temp 97.6°F | Resp 16 | Ht 73.0 in | Wt 245.2 lb

## 2017-08-03 DIAGNOSIS — J189 Pneumonia, unspecified organism: Secondary | ICD-10-CM

## 2017-08-03 DIAGNOSIS — N138 Other obstructive and reflux uropathy: Secondary | ICD-10-CM

## 2017-08-03 DIAGNOSIS — N401 Enlarged prostate with lower urinary tract symptoms: Secondary | ICD-10-CM

## 2017-08-03 NOTE — Telephone Encounter (Signed)
Pt's knot (enlarged lymph node) cord to left testicle is still very thick.  He saw primary care dr today and she suggested letting you know.  Please advise.

## 2017-08-03 NOTE — Progress Notes (Signed)
Chief Complaint  Patient presents with  . Hospitalization Follow-up   HFU with wife present  1. He was in the hospital 12/21-12/23 for sepsis E coli UTI and bacteremia, CXR+pneumonia was given Rocephin x 1, Cefepime x 1 and sent home on Ceftin 500 mg bid x 12 days which he completed  -he is coughing clear sputum but not taking any meds for cough advised Robitussin DM/Mucinex DM   2. C/o hard area to left testicle just saw Dr. Erlene Quan told to call back by Wednesday if not better and she will continue Ab he is doing self cath for urinary retention 4-5x per day but they state she may change to 6x per day.   3. Reviewed labs from 07/19/17 elevated Bun, K 3.7 would continue K for now given he is on diuretic    Review of Systems  Constitutional: Negative for fever.  HENT: Negative for hearing loss.   Respiratory: Positive for cough and sputum production.   Cardiovascular: Negative for chest pain.  Gastrointestinal: Negative for abdominal pain.  Genitourinary:       +urinary retention +hard area to left testicle   Musculoskeletal:       Walking with walker   Skin: Negative for rash.  Neurological: Negative for headaches.  Psychiatric/Behavioral: Negative for memory loss.   Past Medical History:  Diagnosis Date  . Bone spur 1988   left heel  . Bursitis 1987   right shoulder  . Cellulitis 1986   right leg  . Shingles 1959   Past Surgical History:  Procedure Laterality Date  . APPENDECTOMY  1942  . CHOLECYSTECTOMY  1966  . EYE SURGERY  2015   catarcts extracted  . MELANOMA EXCISION  04.2010   mole removal right arm  . TONSILLECTOMY AND ADENOIDECTOMY  1944  . TOTAL HIP ARTHROPLASTY  1999   right hip   Family History  Problem Relation Age of Onset  . Clotting disorder Mother   . Heart disease Father 47  . Cancer Sister        breast  . Cerebral aneurysm Brother        [place]  . Cancer Daughter        breast   Social History   Socioeconomic History  . Marital  status: Married    Spouse name: Jeannett Senior  . Number of children: 3  . Years of education: 46  . Highest education level: Some college, no degree  Social Needs  . Financial resource strain: Not on file  . Food insecurity - worry: Not on file  . Food insecurity - inability: Not on file  . Transportation needs - medical: Not on file  . Transportation needs - non-medical: Not on file  Occupational History  . Not on file  Tobacco Use  . Smoking status: Never Smoker  . Smokeless tobacco: Never Used  Substance and Sexual Activity  . Alcohol use: No    Alcohol/week: 0.0 oz  . Drug use: No  . Sexual activity: Not Currently  Other Topics Concern  . Not on file  Social History Narrative   Lives in [place] with wife.   Diet - regular   Exercise - none   Current Meds  Medication Sig  . aspirin 81 MG chewable tablet Chew 81 mg by mouth daily.  . Ergocalciferol (VITAMIN D2) 400 units TABS Take 1 tablet by mouth daily.  . feeding supplement, ENSURE ENLIVE, (ENSURE ENLIVE) LIQD Take 237 mLs by mouth 2 (two) times daily between meals.  Marland Kitchen  finasteride (PROSCAR) 5 MG tablet Take 1 tablet (5 mg total) by mouth daily.  . Infant Care Products (DERMACLOUD) CREA Apply topically.  . Multiple Vitamins-Minerals (CENTRUM SILVER ULTRA MENS) TABS Take 1 each by mouth daily.  . potassium chloride (MICRO-K) 10 MEQ CR capsule Take 1 capsule (10 mEq total) by mouth daily.  . tamsulosin (FLOMAX) 0.4 MG CAPS capsule Take 1 capsule (0.4 mg total) by mouth at bedtime.  . torsemide (DEMADEX) 5 MG tablet Take 1 tablet (5 mg total) by mouth daily.   Allergies  Allergen Reactions  . Codeine Other (See Comments)    Alters mental state   Recent Results (from the past 2160 hour(s))  Urinalysis, Complete w Microscopic     Status: Abnormal   Collection Time: 05/19/17 12:00 PM  Result Value Ref Range   Color, Urine YELLOW (A) YELLOW   APPearance CLOUDY (A) CLEAR   Specific Gravity, Urine 1.010 1.005 - 1.030   pH  5.0 5.0 - 8.0   Glucose, UA NEGATIVE NEGATIVE mg/dL   Hgb urine dipstick SMALL (A) NEGATIVE   Bilirubin Urine NEGATIVE NEGATIVE   Ketones, ur NEGATIVE NEGATIVE mg/dL   Protein, ur NEGATIVE NEGATIVE mg/dL   Nitrite NEGATIVE NEGATIVE   Leukocytes, UA LARGE (A) NEGATIVE   RBC / HPF 0-5 0 - 5 RBC/hpf   WBC, UA TOO NUMEROUS TO COUNT 0 - 5 WBC/hpf   Bacteria, UA MANY (A) NONE SEEN   Squamous Epithelial / LPF NONE SEEN NONE SEEN   WBC Clumps PRESENT   Urine Culture     Status: Abnormal   Collection Time: 05/19/17 12:00 PM  Result Value Ref Range   Specimen Description URINE, CLEAN CATCH    Special Requests NONE    Culture >=100,000 COLONIES/mL ESCHERICHIA COLI (A)    Report Status 05/21/2017 FINAL    Organism ID, Bacteria ESCHERICHIA COLI (A)       Susceptibility   Escherichia coli - MIC*    AMPICILLIN 4 SENSITIVE Sensitive     CEFAZOLIN <=4 SENSITIVE Sensitive     CEFTRIAXONE <=1 SENSITIVE Sensitive     CIPROFLOXACIN <=0.25 SENSITIVE Sensitive     GENTAMICIN <=1 SENSITIVE Sensitive     IMIPENEM <=0.25 SENSITIVE Sensitive     NITROFURANTOIN <=16 SENSITIVE Sensitive     TRIMETH/SULFA <=20 SENSITIVE Sensitive     AMPICILLIN/SULBACTAM <=2 SENSITIVE Sensitive     PIP/TAZO <=4 SENSITIVE Sensitive     Extended ESBL NEGATIVE Sensitive     * >=100,000 COLONIES/mL ESCHERICHIA COLI  Urinalysis, Complete w Microscopic     Status: Abnormal   Collection Time: 05/20/17  6:25 PM  Result Value Ref Range   Color, Urine YELLOW (A) YELLOW   APPearance CLEAR (A) CLEAR   Specific Gravity, Urine 1.008 1.005 - 1.030   pH 5.0 5.0 - 8.0   Glucose, UA NEGATIVE NEGATIVE mg/dL   Hgb urine dipstick MODERATE (A) NEGATIVE   Bilirubin Urine NEGATIVE NEGATIVE   Ketones, ur NEGATIVE NEGATIVE mg/dL   Protein, ur NEGATIVE NEGATIVE mg/dL   Nitrite NEGATIVE NEGATIVE   Leukocytes, UA TRACE (A) NEGATIVE   RBC / HPF 0-5 0 - 5 RBC/hpf   WBC, UA 0-5 0 - 5 WBC/hpf   Bacteria, UA MANY (A) NONE SEEN   Squamous  Epithelial / LPF NONE SEEN NONE SEEN  Urine Culture     Status: Abnormal   Collection Time: 05/20/17  6:25 PM  Result Value Ref Range   Specimen Description URINE, RANDOM  Special Requests NONE    Culture >=100,000 COLONIES/mL ESCHERICHIA COLI (A)    Report Status 05/23/2017 FINAL    Organism ID, Bacteria ESCHERICHIA COLI (A)       Susceptibility   Escherichia coli - MIC*    AMPICILLIN 4 SENSITIVE Sensitive     CEFAZOLIN <=4 SENSITIVE Sensitive     CEFTRIAXONE <=1 SENSITIVE Sensitive     CIPROFLOXACIN <=0.25 SENSITIVE Sensitive     GENTAMICIN <=1 SENSITIVE Sensitive     IMIPENEM <=0.25 SENSITIVE Sensitive     NITROFURANTOIN <=16 SENSITIVE Sensitive     TRIMETH/SULFA <=20 SENSITIVE Sensitive     AMPICILLIN/SULBACTAM <=2 SENSITIVE Sensitive     PIP/TAZO <=4 SENSITIVE Sensitive     Extended ESBL NEGATIVE Sensitive     * >=100,000 COLONIES/mL ESCHERICHIA COLI  CBC with Differential/Platelet     Status: Abnormal   Collection Time: 05/20/17  6:25 PM  Result Value Ref Range   WBC 23.9 (H) 3.8 - 10.6 K/uL   RBC 3.36 (L) 4.40 - 5.90 MIL/uL   Hemoglobin 10.6 (L) 13.0 - 18.0 g/dL   HCT 31.2 (L) 40.0 - 52.0 %   MCV 93.0 80.0 - 100.0 fL   MCH 31.5 26.0 - 34.0 pg   MCHC 33.9 32.0 - 36.0 g/dL   RDW 14.9 (H) 11.5 - 14.5 %   Platelets 653 (H) 150 - 440 K/uL   Neutrophils Relative % 95 %   Neutro Abs 22.8 (H) 1.4 - 6.5 K/uL   Lymphocytes Relative 3 %   Lymphs Abs 0.6 (L) 1.0 - 3.6 K/uL   Monocytes Relative 2 %   Monocytes Absolute 0.5 0.2 - 1.0 K/uL   Eosinophils Relative 0 %   Eosinophils Absolute 0.0 0 - 0.7 K/uL   Basophils Relative 0 %   Basophils Absolute 0.0 0 - 0.1 K/uL  Basic metabolic panel     Status: Abnormal   Collection Time: 05/20/17  6:25 PM  Result Value Ref Range   Sodium 128 (L) 135 - 145 mmol/L   Potassium 5.2 (H) 3.5 - 5.1 mmol/L   Chloride 93 (L) 101 - 111 mmol/L   CO2 22 22 - 32 mmol/L   Glucose, Bld 105 (H) 65 - 99 mg/dL   BUN 52 (H) 6 - 20 mg/dL    Creatinine, Ser 3.53 (H) 0.61 - 1.24 mg/dL   Calcium 8.0 (L) 8.9 - 10.3 mg/dL   GFR calc non Af Amer 15 (L) >60 mL/min   GFR calc Af Amer 17 (L) >60 mL/min    Comment: (NOTE) The eGFR has been calculated using the CKD EPI equation. This calculation has not been validated in all clinical situations. eGFR's persistently <60 mL/min signify possible Chronic Kidney Disease.    Anion gap 13 5 - 15  Lactic acid, plasma     Status: Abnormal   Collection Time: 05/20/17  7:40 PM  Result Value Ref Range   Lactic Acid, Venous 2.4 (HH) 0.5 - 1.9 mmol/L    Comment: CRITICAL RESULT CALLED TO, READ BACK BY AND VERIFIED WITH REBECCA UHORCHUK 05/20/17 @ 2042  [place]   Blood culture (routine x 2)     Status: None   Collection Time: 05/20/17  8:18 PM  Result Value Ref Range   Specimen Description BLOOD RFOA    Special Requests      BOTTLES DRAWN AEROBIC AND ANAEROBIC Blood Culture adequate volume   Culture NO GROWTH 5 DAYS    Report Status 05/25/2017 FINAL   Blood  culture (routine x 2)     Status: None   Collection Time: 05/20/17  8:19 PM  Result Value Ref Range   Specimen Description BLOOD LFOA    Special Requests      BOTTLES DRAWN AEROBIC AND ANAEROBIC Blood Culture adequate volume   Culture NO GROWTH 5 DAYS    Report Status 05/25/2017 FINAL   Lactic acid, plasma     Status: Abnormal   Collection Time: 05/20/17 11:27 PM  Result Value Ref Range   Lactic Acid, Venous 2.4 (HH) 0.5 - 1.9 mmol/L    Comment: CRITICAL RESULT CALLED TO, READ BACK BY AND VERIFIED WITH LATONYA WILLIAMS 05/21/17 @ 0010  MLK   Procalcitonin     Status: None   Collection Time: 05/20/17 11:27 PM  Result Value Ref Range   Procalcitonin 3.94 ng/mL    Comment:        Interpretation: PCT > 2 ng/mL: Systemic infection (sepsis) is likely, unless other causes are known. (NOTE)         ICU PCT Algorithm               Non ICU PCT Algorithm    ----------------------------     ------------------------------         PCT <  0.25 ng/mL                 PCT < 0.1 ng/mL     Stopping of antibiotics            Stopping of antibiotics       strongly encouraged.               strongly encouraged.    ----------------------------     ------------------------------       PCT level decrease by               PCT < 0.25 ng/mL       >= 80% from peak PCT       OR PCT 0.25 - 0.5 ng/mL          Stopping of antibiotics                                             encouraged.     Stopping of antibiotics           encouraged.    ----------------------------     ------------------------------       PCT level decrease by              PCT >= 0.25 ng/mL       < 80% from peak PCT        AND PCT >= 0.5 ng/mL            Continuing antibiotics                                               encouraged.       Continuing antibiotics            encouraged.    ----------------------------     ------------------------------     PCT level increase compared          PCT > 0.5 ng/mL         with peak  PCT AND          PCT >= 0.5 ng/mL             Escalation of antibiotics                                          strongly encouraged.      Escalation of antibiotics        strongly encouraged.   Protime-INR     Status: Abnormal   Collection Time: 05/20/17 11:27 PM  Result Value Ref Range   Prothrombin Time 16.9 (H) 11.4 - 15.2 seconds   INR 1.39   APTT     Status: None   Collection Time: 05/20/17 11:27 PM  Result Value Ref Range   aPTT 36 24 - 36 seconds  Lactic acid, plasma     Status: Abnormal   Collection Time: 05/21/17  1:31 AM  Result Value Ref Range   Lactic Acid, Venous 2.0 (HH) 0.5 - 1.9 mmol/L    Comment: CRITICAL RESULT CALLED TO, READ BACK BY AND VERIFIED WITH MARCELLA TURNER 05/21/17 @ [place]   Basic metabolic panel     Status: Abnormal   Collection Time: 05/21/17  1:31 AM  Result Value Ref Range   Sodium 134 (L) 135 - 145 mmol/L   Potassium 4.7 3.5 - 5.1 mmol/L   Chloride 101 101 - 111 mmol/L   CO2 25 22 - 32 mmol/L    Glucose, Bld 93 65 - 99 mg/dL   BUN 47 (H) 6 - 20 mg/dL   Creatinine, Ser 2.74 (H) 0.61 - 1.24 mg/dL   Calcium 7.4 (L) 8.9 - 10.3 mg/dL   GFR calc non Af Amer 20 (L) >60 mL/min   GFR calc Af Amer 23 (L) >60 mL/min    Comment: (NOTE) The eGFR has been calculated using the CKD EPI equation. This calculation has not been validated in all clinical situations. eGFR's persistently <60 mL/min signify possible Chronic Kidney Disease.    Anion gap 8 5 - 15  CBC     Status: Abnormal   Collection Time: 05/21/17  1:31 AM  Result Value Ref Range   WBC 19.6 (H) 3.8 - 10.6 K/uL   RBC 2.81 (L) 4.40 - 5.90 MIL/uL   Hemoglobin 8.8 (L) 13.0 - 18.0 g/dL   HCT 26.0 (L) 40.0 - 52.0 %   MCV 92.7 80.0 - 100.0 fL   MCH 31.2 26.0 - 34.0 pg   MCHC 33.6 32.0 - 36.0 g/dL   RDW 14.9 (H) 11.5 - 14.5 %   Platelets 504 (H) 150 - 440 K/uL  MRSA PCR Screening     Status: None   Collection Time: 05/21/17  6:49 AM  Result Value Ref Range   MRSA by PCR NEGATIVE NEGATIVE    Comment:        The GeneXpert MRSA Assay (FDA approved for NASAL specimens only), is one component of a comprehensive MRSA colonization surveillance program. It is not intended to diagnose MRSA infection nor to guide or monitor treatment for MRSA infections.   Basic metabolic panel     Status: Abnormal   Collection Time: 05/22/17  4:13 AM  Result Value Ref Range   Sodium 136 135 - 145 mmol/L   Potassium 3.9 3.5 - 5.1 mmol/L   Chloride 106 101 - 111 mmol/L   CO2 27 22 - 32  mmol/L   Glucose, Bld 100 (H) 65 - 99 mg/dL   BUN 36 (H) 6 - 20 mg/dL   Creatinine, Ser 1.31 (H) 0.61 - 1.24 mg/dL   Calcium 7.2 (L) 8.9 - 10.3 mg/dL   GFR calc non Af Amer 49 (L) >60 mL/min   GFR calc Af Amer 57 (L) >60 mL/min    Comment: (NOTE) The eGFR has been calculated using the CKD EPI equation. This calculation has not been validated in all clinical situations. eGFR's persistently <60 mL/min signify possible Chronic Kidney Disease.    Anion gap 3  (L) 5 - 15  CBC     Status: Abnormal   Collection Time: 05/22/17  4:13 AM  Result Value Ref Range   WBC 14.9 (H) 3.8 - 10.6 K/uL   RBC 2.68 (L) 4.40 - 5.90 MIL/uL   Hemoglobin 8.5 (L) 13.0 - 18.0 g/dL   HCT 25.0 (L) 40.0 - 52.0 %   MCV 93.4 80.0 - 100.0 fL   MCH 31.7 26.0 - 34.0 pg   MCHC 33.9 32.0 - 36.0 g/dL   RDW 14.9 (H) 11.5 - 14.5 %   Platelets 467 (H) 150 - 440 K/uL  C difficile quick scan w PCR reflex     Status: Abnormal   Collection Time: 05/25/17  9:54 AM  Result Value Ref Range   C Diff antigen POSITIVE (A) NEGATIVE   C Diff toxin NEGATIVE NEGATIVE   C Diff interpretation Results are indeterminate. See PCR results.   Clostridium Difficile by PCR     Status: Abnormal   Collection Time: 05/25/17  9:54 AM  Result Value Ref Range   Toxigenic C. Difficile by PCR POSITIVE (A) NEGATIVE    Comment: Positive for toxigenic C. difficile with little to no toxin production. Only treat if clinical presentation suggests symptomatic illness.  CBC with Differential/Platelet     Status: Abnormal   Collection Time: 05/25/17 11:40 AM  Result Value Ref Range   WBC 20.1 (H) 3.8 - 10.6 K/uL   RBC 3.11 (L) 4.40 - 5.90 MIL/uL   Hemoglobin 9.6 (L) 13.0 - 18.0 g/dL   HCT 28.8 (L) 40.0 - 52.0 %   MCV 92.7 80.0 - 100.0 fL   MCH 30.9 26.0 - 34.0 pg   MCHC 33.3 32.0 - 36.0 g/dL   RDW 15.2 (H) 11.5 - 14.5 %   Platelets 565 (H) 150 - 440 K/uL   Neutrophils Relative % 86 %   Lymphocytes Relative 6 %   Monocytes Relative 7 %   Eosinophils Relative 1 %   Basophils Relative 0 %   Neutro Abs 17.3 (H) 1.4 - 6.5 K/uL   Lymphs Abs 1.2 1.0 - 3.6 K/uL   Monocytes Absolute 1.4 (H) 0.2 - 1.0 K/uL   Eosinophils Absolute 0.2 0 - 0.7 K/uL   Basophils Absolute 0.0 0 - 0.1 K/uL  Comprehensive metabolic panel     Status: Abnormal   Collection Time: 05/25/17 11:40 AM  Result Value Ref Range   Sodium 133 (L) 135 - 145 mmol/L   Potassium 3.7 3.5 - 5.1 mmol/L   Chloride 99 (L) 101 - 111 mmol/L   CO2 26 22 -  32 mmol/L   Glucose, Bld 102 (H) 65 - 99 mg/dL   BUN 15 6 - 20 mg/dL   Creatinine, Ser 0.95 0.61 - 1.24 mg/dL   Calcium 7.5 (L) 8.9 - 10.3 mg/dL   Total Protein 4.6 (L) 6.5 - 8.1 g/dL   Albumin 1.8 (  L) 3.5 - 5.0 g/dL   AST 76 (H) 15 - 41 U/L   ALT 73 (H) 17 - 63 U/L   Alkaline Phosphatase 175 (H) 38 - 126 U/L   Total Bilirubin 0.8 0.3 - 1.2 mg/dL   GFR calc non Af Amer >60 >60 mL/min   GFR calc Af Amer >60 >60 mL/min    Comment: (NOTE) The eGFR has been calculated using the CKD EPI equation. This calculation has not been validated in all clinical situations. eGFR's persistently <60 mL/min signify possible Chronic Kidney Disease.    Anion gap 8 5 - 15  Urinalysis, Complete w Microscopic     Status: Abnormal   Collection Time: 05/28/17  4:00 PM  Result Value Ref Range   Color, Urine YELLOW (A) YELLOW   APPearance CLEAR (A) CLEAR   Specific Gravity, Urine 1.012 1.005 - 1.030   pH 7.0 5.0 - 8.0   Glucose, UA NEGATIVE NEGATIVE mg/dL   Hgb urine dipstick MODERATE (A) NEGATIVE   Bilirubin Urine NEGATIVE NEGATIVE   Ketones, ur NEGATIVE NEGATIVE mg/dL   Protein, ur NEGATIVE NEGATIVE mg/dL   Nitrite NEGATIVE NEGATIVE   Leukocytes, UA TRACE (A) NEGATIVE   RBC / HPF TOO NUMEROUS TO COUNT 0 - 5 RBC/hpf   WBC, UA 6-30 0 - 5 WBC/hpf   Bacteria, UA NONE SEEN NONE SEEN   Squamous Epithelial / LPF NONE SEEN NONE SEEN   Budding Yeast PRESENT   Urine Culture     Status: Abnormal   Collection Time: 05/28/17  4:00 PM  Result Value Ref Range   Specimen Description URINE, RANDOM    Special Requests NONE    Culture >=100,000 COLONIES/mL YEAST (A)    Report Status 05/30/2017 FINAL   C difficile quick scan w PCR reflex     Status: None   Collection Time: 06/05/17  9:45 AM  Result Value Ref Range   C Diff antigen NEGATIVE NEGATIVE   C Diff toxin NEGATIVE NEGATIVE   C Diff interpretation No C. difficile detected.   CBC with Differential/Platelet     Status: Abnormal   Collection Time:  06/12/17  9:30 AM  Result Value Ref Range   WBC 15.1 (H) 3.8 - 10.6 K/uL   RBC 4.18 (L) 4.40 - 5.90 MIL/uL   Hemoglobin 12.4 (L) 13.0 - 18.0 g/dL   HCT 38.5 (L) 40.0 - 52.0 %   MCV 92.0 80.0 - 100.0 fL   MCH 29.8 26.0 - 34.0 pg   MCHC 32.3 32.0 - 36.0 g/dL   RDW 16.0 (H) 11.5 - 14.5 %   Platelets 514 (H) 150 - 440 K/uL   Neutrophils Relative % 74 %   Neutro Abs 11.2 (H) 1.4 - 6.5 K/uL   Lymphocytes Relative 18 %   Lymphs Abs 2.7 1.0 - 3.6 K/uL   Monocytes Relative 6 %   Monocytes Absolute 0.9 0.2 - 1.0 K/uL   Eosinophils Relative 1 %   Eosinophils Absolute 0.1 0 - 0.7 K/uL   Basophils Relative 1 %   Basophils Absolute 0.1 0 - 0.1 K/uL  Comprehensive metabolic panel     Status: Abnormal   Collection Time: 06/12/17  9:30 AM  Result Value Ref Range   Sodium 133 (L) 135 - 145 mmol/L   Potassium 3.9 3.5 - 5.1 mmol/L   Chloride 92 (L) 101 - 111 mmol/L   CO2 29 22 - 32 mmol/L   Glucose, Bld 131 (H) 65 - 99 mg/dL   BUN  28 (H) 6 - 20 mg/dL   Creatinine, Ser 0.99 0.61 - 1.24 mg/dL   Calcium 8.8 (L) 8.9 - 10.3 mg/dL   Total Protein 7.0 6.5 - 8.1 g/dL   Albumin 3.1 (L) 3.5 - 5.0 g/dL   AST 69 (H) 15 - 41 U/L   ALT 77 (H) 17 - 63 U/L   Alkaline Phosphatase 122 38 - 126 U/L   Total Bilirubin 1.1 0.3 - 1.2 mg/dL   GFR calc non Af Amer >60 >60 mL/min   GFR calc Af Amer >60 >60 mL/min    Comment: (NOTE) The eGFR has been calculated using the CKD EPI equation. This calculation has not been validated in all clinical situations. eGFR's persistently <60 mL/min signify possible Chronic Kidney Disease.    Anion gap 12 5 - 15  Comprehensive metabolic panel     Status: None   Collection Time: 07/15/17 11:45 AM  Result Value Ref Range   Sodium 140 135 - 145 mEq/L   Potassium 4.8 3.5 - 5.1 mEq/L   Chloride 103 96 - 112 mEq/L   CO2 30 19 - 32 mEq/L   Glucose, Bld 95 70 - 99 mg/dL   BUN 16 6 - 23 mg/dL   Creatinine, Ser 1.04 0.40 - 1.50 mg/dL   Total Bilirubin 0.8 0.2 - 1.2 mg/dL    Alkaline Phosphatase 64 39 - 117 U/L   AST 26 0 - 37 U/L   ALT 26 0 - 53 U/L   Total Protein 6.8 6.0 - 8.3 g/dL   Albumin 3.6 3.5 - 5.2 g/dL   Calcium 9.4 8.4 - 10.5 mg/dL   GFR 72.54 >60.00 mL/min  CBC w/Diff     Status: Abnormal   Collection Time: 07/15/17 11:55 AM  Result Value Ref Range   WBC 9.4 3.8 - 10.8 Thousand/uL   RBC 4.06 (L) 4.20 - 5.80 Million/uL   Hemoglobin 12.8 (L) 13.2 - 17.1 g/dL   HCT 36.5 (L) 38.5 - 50.0 %   MCV 89.9 80.0 - 100.0 fL   MCH 31.5 27.0 - 33.0 pg   MCHC 35.1 32.0 - 36.0 g/dL   RDW 15.4 (H) 11.0 - 15.0 %   Platelets 343 140 - 400 Thousand/uL   MPV 10.6 7.5 - 12.5 fL   Neutro Abs 6,655 1,500 - 7,800 cells/uL   Lymphs Abs 1,748 850 - 3,900 cells/uL   WBC mixed population 771 200 - 950 cells/uL   Eosinophils Absolute 179 15 - 500 cells/uL   Basophils Absolute 47 0 - 200 cells/uL   Neutrophils Relative % 70.8 %   Total Lymphocyte 18.6 %   Monocytes Relative 8.2 %   Eosinophils Relative 1.9 %   Basophils Relative 0.5 %  Urinalysis, Complete w Microscopic     Status: Abnormal   Collection Time: 07/17/17 10:20 AM  Result Value Ref Range   Color, Urine YELLOW (A) YELLOW   APPearance HAZY (A) CLEAR   Specific Gravity, Urine 1.010 1.005 - 1.030   pH 7.0 5.0 - 8.0   Glucose, UA NEGATIVE NEGATIVE mg/dL   Hgb urine dipstick SMALL (A) NEGATIVE   Bilirubin Urine NEGATIVE NEGATIVE   Ketones, ur NEGATIVE NEGATIVE mg/dL   Protein, ur 100 (A) NEGATIVE mg/dL   Nitrite NEGATIVE NEGATIVE   Leukocytes, UA LARGE (A) NEGATIVE   RBC / HPF 6-30 0 - 5 RBC/hpf   WBC, UA TOO NUMEROUS TO COUNT 0 - 5 WBC/hpf   Bacteria, UA RARE (A) NONE SEEN  Squamous Epithelial / LPF NONE SEEN NONE SEEN   WBC Clumps PRESENT    Mucus PRESENT    Hyaline Casts, UA PRESENT   Urine culture     Status: Abnormal   Collection Time: 07/17/17 10:20 AM  Result Value Ref Range   Specimen Description      URINE, CATHETERIZED Performed at [facility], [address]  [place], [place] [address]    Special Requests      NONE Performed at [facility], [address] [place], [place] [address]    Culture >=100,000 COLONIES/mL ESCHERICHIA COLI (A)    Report Status 07/19/2017 FINAL    Organism ID, Bacteria ESCHERICHIA COLI (A)       Susceptibility   Escherichia coli - MIC*    AMPICILLIN 4 SENSITIVE Sensitive     CEFAZOLIN <=4 SENSITIVE Sensitive     CEFTRIAXONE <=1 SENSITIVE Sensitive     CIPROFLOXACIN <=0.25 SENSITIVE Sensitive     GENTAMICIN <=1 SENSITIVE Sensitive     IMIPENEM <=0.25 SENSITIVE Sensitive     NITROFURANTOIN <=16 SENSITIVE Sensitive     TRIMETH/SULFA <=20 SENSITIVE Sensitive     AMPICILLIN/SULBACTAM <=2 SENSITIVE Sensitive     PIP/TAZO <=4 SENSITIVE Sensitive     Extended ESBL NEGATIVE Sensitive     * >=100,000 COLONIES/mL ESCHERICHIA COLI  Legionella Pneumophila Serogp 1 Ur Ag     Status: None   Collection Time: 07/17/17 10:20 AM  Result Value Ref Range   L. pneumophila Serogp 1 Ur Ag Negative Negative    Comment: (NOTE) Presumptive negative for L. pneumophila serogroup 1 antigen in urine, suggesting no recent or current infection. Legionnaires' disease cannot be ruled out since other serogroups and species may also cause disease. Performed At: [facility] [place], [place] [id number] Rush Farmer MD [id number]    Source of Sample URINE, RANDOM     Comment: Performed at [facility], [address] [place], [place] [address]  Strep pneumoniae urinary antigen     Status: None   Collection Time: 07/17/17 10:20 AM  Result Value Ref Range   Strep Pneumo Urinary Antigen NEGATIVE NEGATIVE    Comment:        Infection due to S. pneumoniae cannot be absolutely ruled out since the antigen present may be below the detection limit of the test. Performed at [facility], [address], [place], [place] [address]   Comprehensive metabolic panel     Status: Abnormal   Collection  Time: 07/17/17 10:21 AM  Result Value Ref Range   Sodium 136 135 - 145 mmol/L   Potassium 3.8 3.5 - 5.1 mmol/L   Chloride 99 (L) 101 - 111 mmol/L   CO2 28 22 - 32 mmol/L   Glucose, Bld 148 (H) 65 - 99 mg/dL   BUN 20 6 - 20 mg/dL   Creatinine, Ser 1.25 (H) 0.61 - 1.24 mg/dL   Calcium 8.9 8.9 - 10.3 mg/dL   Total Protein 6.8 6.5 - 8.1 g/dL   Albumin 3.7 3.5 - 5.0 g/dL   AST 34 15 - 41 U/L   ALT 24 17 - 63 U/L   Alkaline Phosphatase 70 38 - 126 U/L   Total Bilirubin 1.5 (H) 0.3 - 1.2 mg/dL   GFR calc non Af Amer 52 (L) >60 mL/min   GFR calc Af Amer >60 >60 mL/min    Comment: (NOTE) The eGFR has been calculated using the CKD EPI equation. This calculation has  not been validated in all clinical situations. eGFR's persistently <60 mL/min signify possible Chronic Kidney Disease.    Anion gap 9 5 - 15  CBC with Differential     Status: Abnormal   Collection Time: 07/17/17 10:21 AM  Result Value Ref Range   WBC 20.9 (H) 3.8 - 10.6 K/uL   RBC 4.03 (L) 4.40 - 5.90 MIL/uL   Hemoglobin 12.6 (L) 13.0 - 18.0 g/dL   HCT 37.4 (L) 40.0 - 52.0 %   MCV 92.9 80.0 - 100.0 fL   MCH 31.3 26.0 - 34.0 pg   MCHC 33.7 32.0 - 36.0 g/dL   RDW 16.8 (H) 11.5 - 14.5 %   Platelets 293 150 - 440 K/uL   Neutrophils Relative % 87 %   Neutro Abs 18.3 (H) 1.4 - 6.5 K/uL   Lymphocytes Relative 5 %   Lymphs Abs 1.0 1.0 - 3.6 K/uL   Monocytes Relative 7 %   Monocytes Absolute 1.5 (H) 0.2 - 1.0 K/uL   Eosinophils Relative 0 %   Eosinophils Absolute 0.0 0 - 0.7 K/uL   Basophils Relative 1 %   Basophils Absolute 0.1 0 - 0.1 K/uL  Lactic acid, plasma     Status: Abnormal   Collection Time: 07/17/17 10:21 AM  Result Value Ref Range   Lactic Acid, Venous 2.2 (HH) 0.5 - 1.9 mmol/L    Comment: CRITICAL RESULT CALLED TO, READ BACK BY AND VERIFIED WITH LAURA CATES 07/17/17 @ [place]   Culture, blood (routine x 2)     Status: Abnormal   Collection Time: 07/17/17 10:30 AM  Result Value Ref Range   Specimen  Description      BLOOD LFOA Performed at [facility], [address] [place], [place] [address]    Special Requests      BOTTLES DRAWN AEROBIC AND ANAEROBIC Blood Culture adequate volume Performed at [facility], [address] [place], [place] [address]    Culture  Setup Time      IN BOTH AEROBIC AND ANAEROBIC BOTTLES GRAM NEGATIVE RODS CRITICAL RESULT CALLED TO, READ BACK BY AND VERIFIED WITH: DAVID BESANTI @ 0136 ON 07/18/2017 BY CAF    Culture ESCHERICHIA COLI (A)    Report Status 07/20/2017 FINAL    Organism ID, Bacteria ESCHERICHIA COLI       Susceptibility   Escherichia coli - MIC*    AMPICILLIN 4 SENSITIVE Sensitive     CEFAZOLIN <=4 SENSITIVE Sensitive     CEFEPIME <=1 SENSITIVE Sensitive     CEFTAZIDIME <=1 SENSITIVE Sensitive     CEFTRIAXONE <=1 SENSITIVE Sensitive     CIPROFLOXACIN <=0.25 SENSITIVE Sensitive     GENTAMICIN <=1 SENSITIVE Sensitive     IMIPENEM <=0.25 SENSITIVE Sensitive     TRIMETH/SULFA <=20 SENSITIVE Sensitive     AMPICILLIN/SULBACTAM <=2 SENSITIVE Sensitive     PIP/TAZO <=4 SENSITIVE Sensitive     Extended ESBL NEGATIVE Sensitive     * ESCHERICHIA COLI  Culture, blood (routine x 2)     Status: Abnormal   Collection Time: 07/17/17 10:30 AM  Result Value Ref Range   Specimen Description      BLOOD RFOA Performed at [facility], [address], [place], [place] [address]    Special Requests      BOTTLES DRAWN AEROBIC AND ANAEROBIC Blood Culture adequate volume Performed at [facility], [address] [place], [place] [address]    Culture  Setup Time  GRAM NEGATIVE RODS IN BOTH AEROBIC AND ANAEROBIC BOTTLES CRITICAL VALUE NOTED.  VALUE IS CONSISTENT WITH PREVIOUSLY REPORTED AND CALLED VALUE.    Culture (A)     ESCHERICHIA COLI SUSCEPTIBILITIES PERFORMED ON PREVIOUS CULTURE WITHIN THE LAST 5 DAYS. Performed at [facility], [place] [address] [place], [place] [address]    Report Status  07/21/2017 FINAL   Blood Culture ID Panel (Reflexed)     Status: Abnormal   Collection Time: 07/17/17 10:30 AM  Result Value Ref Range   Enterococcus species NOT DETECTED NOT DETECTED   Listeria monocytogenes NOT DETECTED NOT DETECTED   Staphylococcus species NOT DETECTED NOT DETECTED   Staphylococcus aureus NOT DETECTED NOT DETECTED   Streptococcus species NOT DETECTED NOT DETECTED   Streptococcus agalactiae NOT DETECTED NOT DETECTED   Streptococcus pneumoniae NOT DETECTED NOT DETECTED   Streptococcus pyogenes NOT DETECTED NOT DETECTED   Acinetobacter baumannii NOT DETECTED NOT DETECTED   Enterobacteriaceae species DETECTED (A) NOT DETECTED    Comment: Enterobacteriaceae represent a large family of gram-negative bacteria, not a single organism. CRITICAL RESULT CALLED TO, READ BACK BY AND VERIFIED WITH: DAVID BESANTI @ 0136 ON 07/18/2017 BY CAF    Enterobacter cloacae complex NOT DETECTED NOT DETECTED   Escherichia coli DETECTED (A) NOT DETECTED    Comment: CRITICAL RESULT CALLED TO, READ BACK BY AND VERIFIED WITH: DAVID BESANTI @ 0136 ON 07/18/2017 BY CAF    Klebsiella oxytoca NOT DETECTED NOT DETECTED   Klebsiella pneumoniae NOT DETECTED NOT DETECTED   Proteus species NOT DETECTED NOT DETECTED   Serratia marcescens NOT DETECTED NOT DETECTED   Carbapenem resistance NOT DETECTED NOT DETECTED   Haemophilus influenzae NOT DETECTED NOT DETECTED   Neisseria meningitidis NOT DETECTED NOT DETECTED   Pseudomonas aeruginosa NOT DETECTED NOT DETECTED   Candida albicans NOT DETECTED NOT DETECTED   Candida glabrata NOT DETECTED NOT DETECTED   Candida krusei NOT DETECTED NOT DETECTED   Candida parapsilosis NOT DETECTED NOT DETECTED   Candida tropicalis NOT DETECTED NOT DETECTED    Comment: Performed at [facility], [address] [place], [place] [address]  Lactic acid, plasma     Status: Abnormal   Collection Time: 07/17/17  1:15 PM  Result Value Ref Range   Lactic Acid,  Venous 2.2 (HH) 0.5 - 1.9 mmol/L    Comment: CRITICAL RESULT CALLED TO, READ BACK BY AND VERIFIED WITH LAURA CATES ON 07/17/17 AT 1404 QSD Performed at [facility], [address] [place], [place] [address]   Lactic acid, plasma     Status: Abnormal   Collection Time: 07/17/17  2:59 PM  Result Value Ref Range   Lactic Acid, Venous 2.1 (HH) 0.5 - 1.9 mmol/L    Comment: CRITICAL RESULT CALLED TO, READ BACK BY AND VERIFIED WITH MEGAN OAKLEY 07/17/17 @ [place] Performed at [facility], [address] [place], [place] [address]   Creatinine, serum     Status: Abnormal   Collection Time: 07/18/17  5:41 AM  Result Value Ref Range   Creatinine, Ser 1.24 0.61 - 1.24 mg/dL   GFR calc non Af Amer 52 (L) >60 mL/min   GFR calc Af Amer >60 >60 mL/min    Comment: (NOTE) The eGFR has been calculated using the CKD EPI equation. This calculation has not been validated in all clinical situations. eGFR's persistently <60 mL/min signify possible Chronic Kidney Disease. Performed at [facility], [address] [place], [place] [address]  HIV antibody     Status: None   Collection Time: 07/18/17  5:41 AM  Result Value Ref Range   HIV Screen 4th Generation wRfx Non Reactive Non Reactive    Comment: (NOTE) Performed At: [facility] [place], [place] [id number] Rush Farmer MD [id number] Performed at [facility], [address] [place], [place] [address]   MRSA PCR Screening     Status: None   Collection Time: 07/18/17  9:34 AM  Result Value Ref Range   MRSA by PCR NEGATIVE NEGATIVE    Comment:        The GeneXpert MRSA Assay (FDA approved for NASAL specimens only), is one component of a comprehensive MRSA colonization surveillance program. It is not intended to diagnose MRSA infection nor to guide or monitor treatment for MRSA infections. Performed at [facility], [address] [place], [place]  [address]   CBC     Status: Abnormal   Collection Time: 07/19/17  7:12 AM  Result Value Ref Range   WBC 10.1 3.8 - 10.6 K/uL   RBC 3.70 (L) 4.40 - 5.90 MIL/uL   Hemoglobin 11.6 (L) 13.0 - 18.0 g/dL   HCT 34.0 (L) 40.0 - 52.0 %   MCV 92.1 80.0 - 100.0 fL   MCH 31.4 26.0 - 34.0 pg   MCHC 34.1 32.0 - 36.0 g/dL   RDW 16.7 (H) 11.5 - 14.5 %   Platelets 249 150 - 440 K/uL    Comment: Performed at [facility], [address] [place], [place] [address]  Basic metabolic panel     Status: Abnormal   Collection Time: 07/19/17  7:12 AM  Result Value Ref Range   Sodium 135 135 - 145 mmol/L   Potassium 3.7 3.5 - 5.1 mmol/L   Chloride 101 101 - 111 mmol/L   CO2 27 22 - 32 mmol/L   Glucose, Bld 102 (H) 65 - 99 mg/dL   BUN 23 (H) 6 - 20 mg/dL   Creatinine, Ser 1.23 0.61 - 1.24 mg/dL   Calcium 8.4 (L) 8.9 - 10.3 mg/dL   GFR calc non Af Amer 53 (L) >60 mL/min   GFR calc Af Amer >60 >60 mL/min    Comment: (NOTE) The eGFR has been calculated using the CKD EPI equation. This calculation has not been validated in all clinical situations. eGFR's persistently <60 mL/min signify possible Chronic Kidney Disease.    Anion gap 7 5 - 15    Comment: Performed at [facility], [address] [place], [place] [address]  Vancomycin, trough     Status: Abnormal   Collection Time: 07/19/17  7:12 AM  Result Value Ref Range   Vancomycin Tr 7 (L) 15 - 20 ug/mL    Comment: Performed at [facility], [address] [place], [place] [address]   Objective  Body mass index is 32.36 kg/m. Wt Readings from Last 3 Encounters:  08/03/17 245 lb 4 oz (111.2 kg)  07/29/17 248 lb (112.5 kg)  07/17/17 250 lb 9.6 oz (113.7 kg)   Temp Readings from Last 3 Encounters:  08/03/17 97.6 F (36.4 C) (Oral)  07/19/17 98.6 F (37 C) (Oral)  07/15/17 97.8 F (36.6 C) (Oral)   BP Readings from Last 3 Encounters:  08/03/17 130/64  07/29/17 (!) 151/67  07/19/17 139/60   Pulse Readings from  Last 3 Encounters:  08/03/17 86  07/29/17 94  07/19/17 78   O2 sat room air 97%  Physical Exam  Constitutional: He is oriented to person, place, and time and well-developed, well-nourished, and in no distress. Vital signs are normal.  HENT:  Head: Normocephalic and atraumatic.  Mouth/Throat: Oropharynx is clear and moist and mucous membranes are normal.  Eyes: Conjunctivae are normal. Pupils are equal, round, and reactive to light.  Cardiovascular: Normal rate, regular rhythm and normal heart sounds.  No murmur heard. Pulmonary/Chest: Effort normal and breath sounds normal. He has no wheezes.  Genitourinary:  Genitourinary Comments: Hardened area to left testicle superior region   Neurological: He is alert and oriented to person, place, and time.  BL walking with rolling walker   Skin: Skin is warm and dry.  Psychiatric: Mood, memory, affect and judgment normal.  Nursing note and vitals reviewed.   Assessment   1. H/o sepsis 2/2 pneumonia, E coli UTI/bacteremia  Resolved sepsis  2. Hardened area to left testicle, BPH with urinary retention  3.HM  Plan  1. Doing better repeat CXR 09/04/17  rec  Try Mucinex DM or Robitussin DM OTC for cough  Completed Ceftin 500 mg bid x 12 days  F/u with PCP in 6-8 weeks   2. rec pt and wife call Dr. Erlene Quan they saw on 07/29/17 and she rec call back  Self cath 4-5 x per day currently per pt and wife disc possibly going up to 6x with urology  3. UTD vaccines except  Consider repeat pna 23 vaccine and shingrix in future   Provider: Dr. Olivia Mackie McLean-Scocuzza-Internal Medicine

## 2017-08-03 NOTE — Patient Instructions (Addendum)
Results for Terry Vaughn, Terry E (MRN [id number]) as of 08/03/2017 11:57  Ref. Range 07/19/2017 07:12  Sodium Latest Ref Range: 135 - 145 mmol/L 135  Potassium Latest Ref Range: 3.5 - 5.1 mmol/L 3.7  Chloride Latest Ref Range: 101 - 111 mmol/L 101  CO2 Latest Ref Range: 22 - 32 mmol/L 27  Glucose Latest Ref Range: 65 - 99 mg/dL 829102 (H)  BUN Latest Ref Range: 6 - 20 mg/dL 23 (H)  Creatinine Latest Ref Range: 0.61 - 1.24 mg/dL 5.621.23  Calcium Latest Ref Range: 8.9 - 10.3 mg/dL 8.4 (L)  Anion gap Latest Ref Range: 5 - 15  7  GFR, Est Non African American Latest Ref Range: >60 mL/min 53 (L)  GFR, Est African American Latest Ref Range: >60 mL/min >60   Please come back for repeat Chest Xray 09/04/17  Follow up with your regular doctor in 6-8 weeks  Try Robitussin DM or Mucinex DM for cough  Please call Dr. Apolinar JunesBrandon (urology)  Take care     Community-Acquired Pneumonia, Adult Pneumonia is an infection of the lungs. One type of pneumonia can happen while a person is in a hospital. A different type can happen when a person is not in a hospital (community-acquired pneumonia). It is easy for this kind to spread from person to person. It can spread to you if you breathe near an infected person who coughs or sneezes. Some symptoms include:  A dry cough.  A wet (productive) cough.  Fever.  Sweating.  Chest pain.  Follow these instructions at home:  Take over-the-counter and prescription medicines only as told by your doctor. ? Only take cough medicine if you are losing sleep. ? If you were prescribed an antibiotic medicine, take it as told by your doctor. Do not stop taking the antibiotic even if you start to feel better.  Sleep with your head and neck raised (elevated). You can do this by putting a few pillows under your head, or you can sleep in a recliner.  Do not use tobacco products. These include cigarettes, chewing tobacco, and e-cigarettes. If you need help quitting, ask your  doctor.  Drink enough water to keep your pee (urine) clear or pale yellow. A shot (vaccine) can help prevent pneumonia. Shots are often suggested for:  People older than 82 years of age.  People older than 82 years of age: ? Who are having cancer treatment. ? Who have long-term (chronic) lung disease. ? Who have problems with their body's defense system (immune system).  You may also prevent pneumonia if you take these actions:  Get the flu (influenza) shot every year.  Go to the dentist as often as told.  Wash your hands often. If soap and water are not available, use hand sanitizer.  Contact a doctor if:  You have a fever.  You lose sleep because your cough medicine does not help. Get help right away if:  You are short of breath and it gets worse.  You have more chest pain.  Your sickness gets worse. This is very serious if: ? You are an older adult. ? Your body's defense system is weak.  You cough up blood. This information is not intended to replace advice given to you by your health care provider. Make sure you discuss any questions you have with your health care provider. Document Released: 12/31/2007 Document Revised: 12/20/2015 Document Reviewed: 11/08/2014 Elsevier Interactive Patient Education  [employer]2018 Elsevier Inc.

## 2017-08-03 NOTE — Telephone Encounter (Signed)
Given his history, I would treat him for an additional week with Ceftin 500 mg twice daily times 7 days.  If it fails to resolve after this, please have him schedule an appointment with me.    Vanna ScotlandAshley Dajohn Ellender, MD

## 2017-08-04 MED ORDER — CEFUROXIME AXETIL 500 MG PO TABS: 500.0000 mg | ORAL_TABLET | Freq: Two times a day (BID) | ORAL | 0 refills | Status: DC

## 2017-08-04 NOTE — Telephone Encounter (Signed)
Spoke with pt wife in reference to abx. Made aware if s/s dont resolve will need an appt. Wife voiced understanding.

## 2017-08-18 ENCOUNTER — Ambulatory Visit (INDEPENDENT_AMBULATORY_CARE_PROVIDER_SITE_OTHER): Payer: Medicare Other | Admitting: Urology

## 2017-08-18 ENCOUNTER — Encounter: Payer: Self-pay | Admitting: Urology

## 2017-08-18 VITALS — BP 165/74 | HR 84 | Ht 73.0 in | Wt 251.2 lb

## 2017-08-18 DIAGNOSIS — B9620 Unspecified Escherichia coli [E. coli] as the cause of diseases classified elsewhere: Secondary | ICD-10-CM

## 2017-08-18 DIAGNOSIS — R7881 Bacteremia: Secondary | ICD-10-CM

## 2017-08-18 DIAGNOSIS — N5089 Other specified disorders of the male genital organs: Secondary | ICD-10-CM | POA: Diagnosis not present

## 2017-08-18 DIAGNOSIS — R339 Retention of urine, unspecified: Secondary | ICD-10-CM

## 2017-08-18 LAB — URINALYSIS, COMPLETE
Bilirubin, UA: NEGATIVE
Glucose, UA: NEGATIVE
Ketones, UA: NEGATIVE
Leukocytes, UA: NEGATIVE
Nitrite, UA: NEGATIVE
Protein, UA: NEGATIVE
Specific Gravity, UA: 1.015 (ref 1.005–1.030)
Urobilinogen, Ur: 0.2 mg/dL (ref 0.2–1.0)
pH, UA: 7 (ref 5.0–7.5)

## 2017-08-18 LAB — MICROSCOPIC EXAMINATION
Bacteria, UA: NONE SEEN
Epithelial Cells (non renal): NONE SEEN /hpf (ref 0–10)
WBC, UA: NONE SEEN /hpf (ref 0–?)

## 2017-08-19 NOTE — Progress Notes (Signed)
08/18/2017 4:24 PM   [name] March 18, 1933 [id number]  Referring provider: Allegra Grana, FNP [address] [place], [place] [address]  Chief Complaint  Patient presents with  . Groin Swelling    HPI: 84 year old male with urinary retention and history of urosepsis, severe epididmoorchitis who returns today for recheck.    Since last visit, he has completed a prolonged course of E. Coli UTI/ bacteriemia with keflex x 12 days then given an additional 7 days due to persistently firm cord.    He denies any fever or chills.  Overall, continues to do well with energy and mobility.    No further fever or chills.  He continues to self cath but with decreasing frequency.  He is now voiding spontaneously more on his own.  His post void residuals range from 125-225 cc after voiding approximately similar amount.  This is improved.    PMH: Past Medical History:  Diagnosis Date  . Bone spur 1988   left heel  . Bursitis 1987   right shoulder  . Cellulitis 1986   right leg  . Pneumonia    noted 07/17/17 CXR   . Shingles 1959  . Urinary retention   . UTI (urinary tract infection)    sepsis E coli UTI +urine and blood cultures 07/17/17     Surgical History: Past Surgical History:  Procedure Laterality Date  . APPENDECTOMY  1942  . CHOLECYSTECTOMY  1966  . EYE SURGERY  2015   catarcts extracted  . JOINT REPLACEMENT     right hip 20 + years from 2019, left hip replaced 05/01/17   . MELANOMA EXCISION  04.2010   mole removal right arm  . TONSILLECTOMY AND ADENOIDECTOMY  1944  . TOTAL HIP ARTHROPLASTY  1999   right hip    Home Medications:  Allergies as of 08/18/2017      Reactions   Codeine Other (See Comments)   Alters mental state      Medication List        Accurate as of 08/18/17 11:59 PM. Always use your most recent med list.          aspirin 81 MG chewable tablet Chew 81 mg by mouth daily.   CENTRUM SILVER ULTRA MENS Tabs Take 1 each by  mouth daily.   DERMACLOUD Crea Apply topically.   feeding supplement (ENSURE ENLIVE) Liqd Take 237 mLs by mouth 2 (two) times daily between meals.   finasteride 5 MG tablet Commonly known as:  PROSCAR Take 1 tablet (5 mg total) by mouth daily.   potassium chloride 10 MEQ CR capsule Commonly known as:  MICRO-K Take 1 capsule (10 mEq total) by mouth daily.   tamsulosin 0.4 MG Caps capsule Commonly known as:  FLOMAX Take 1 capsule (0.4 mg total) by mouth at bedtime.   torsemide 5 MG tablet Commonly known as:  DEMADEX Take 1 tablet (5 mg total) by mouth daily.   Vitamin D2 400 units Tabs Take 1 tablet by mouth daily.       Allergies:  Allergies  Allergen Reactions  . Codeine Other (See Comments)    Alters mental state    Family History: Family History  Problem Relation Age of Onset  . Clotting disorder Mother   . Heart disease Father 72  . Cancer Sister        breast  . Cerebral aneurysm Brother        hemmorage  . Cancer Daughter  breast    Social History:  reports that  has never smoked. he has never used smokeless tobacco. He reports that he does not drink alcohol or use drugs.  ROS: UROLOGY Frequent Urination?: No Hard to postpone urination?: No Burning/pain with urination?: No Get up at night to urinate?: No Leakage of urine?: No Urine stream starts and stops?: No Trouble starting stream?: No Do you have to strain to urinate?: No Blood in urine?: No Urinary tract infection?: No Sexually transmitted disease?: No Injury to kidneys or bladder?: No Painful intercourse?: No Weak stream?: No Erection problems?: No Penile pain?: No  Gastrointestinal Nausea?: No Vomiting?: No Indigestion/heartburn?: No Diarrhea?: No Constipation?: No  Constitutional Fever: No Night sweats?: No Weight loss?: No Fatigue?: No  Skin Skin rash/lesions?: No Itching?: No  Eyes Blurred vision?: No Double vision?: No  Ears/Nose/Throat Sore throat?:  No Sinus problems?: No  Hematologic/Lymphatic Swollen glands?: No Easy bruising?: No  Cardiovascular Leg swelling?: No Chest pain?: No  Respiratory Cough?: No Shortness of breath?: No  Endocrine Excessive thirst?: No  Musculoskeletal Back pain?: No Joint pain?: No  Neurological Headaches?: No Dizziness?: No  Psychologic Depression?: No Anxiety?: No  Physical Exam: BP (!) 165/74 (BP Location: Left Arm, Patient Position: Sitting, Cuff Size: Normal)   Pulse 84   Ht 6\' 1"  (1.854 m)   Wt 251 lb 3.2 oz (113.9 kg)   BMI 33.14 kg/m   Constitutional:  Alert and oriented, No acute distress.  Accompanied by wife.  No longer ambulating with cane. HEENT: [place] AT, moist mucus membranes.  Trachea midline, no masses. Cardiovascular: No clubbing, cyanosis, or edema. Respiratory: Normal respiratory effort, no increased work of breathing. GU: Circumcised phallus.  Bilateral descended testicles.  Left testicle with resolved firmness and enlargement, now very similar to right testicle.  The left cord continues to be thickened, however nontender.  There is no obvious palpable hernia bilaterally.  No obvious inguinal adenopathy. Skin: No rashes, bruises or suspicious lesions. Neurologic: Grossly intact, no focal deficits, moving all 4 extremities. Psychiatric: Normal mood and affect.  Laboratory Data: Lab Results  Component Value Date   WBC 10.1 07/19/2017   HGB 11.6 (L) 07/19/2017   HCT 34.0 (L) 07/19/2017   MCV 92.1 07/19/2017   PLT 249 07/19/2017    Lab Results  Component Value Date   CREATININE 1.23 07/19/2017    Lab Results  Component Value Date   HGBA1C 5.1 04/15/2017    Urinalysis Lab Results  Component Value Date   SPECGRAV 1.015 08/18/2017   PHUR 7.0 08/18/2017   COLORU Yellow 08/18/2017   APPEARANCEUR Clear 08/18/2017   LEUKOCYTESUR Negative 08/18/2017   PROTEINUR Negative 08/18/2017   GLUCOSEU Negative 08/18/2017   KETONESU Negative 08/18/2017   RBCU  Trace (A) 08/18/2017   BILIRUBINUR Negative 08/18/2017   UUROB 0.2 08/18/2017   NITRITE Negative 08/18/2017    Lab Results  Component Value Date   LABMICR See below: 08/18/2017   WBCUA None seen 08/18/2017   RBCUA 0-2 08/18/2017   LABEPIT None seen 08/18/2017   BACTERIA None seen 08/18/2017    Pertinent Imaging: N/a  Assessment & Plan:    1. E coli bacteremia S/p completion of prolonged course of ceftin UA today completely clear Clinically improving  2. Urinary retention Continue self cath-recommend increasing frequency of self cath to 6 times as needed Starting to void some independently with lower PVRs which is reassuring  Continue Flomax and finasteride  3. Left epididymitis Resolving, testicle now back to  normal Continues to have induration of the left cord but no overlying skin changes or any other concern for ongoing infection.  Suspect this is residual inflammation.  Follow-up in March as scheduled  Vanna ScotlandAshley Camauri Fleece, MD  [facility] [address], [address] [place], [place] [address] [phone]

## 2017-08-20 LAB — CULTURE, URINE COMPREHENSIVE

## 2017-09-04 ENCOUNTER — Ambulatory Visit (INDEPENDENT_AMBULATORY_CARE_PROVIDER_SITE_OTHER): Payer: Medicare Other

## 2017-09-04 DIAGNOSIS — J189 Pneumonia, unspecified organism: Secondary | ICD-10-CM

## 2017-10-07 ENCOUNTER — Encounter: Payer: Self-pay | Admitting: Urology

## 2017-10-07 ENCOUNTER — Ambulatory Visit (INDEPENDENT_AMBULATORY_CARE_PROVIDER_SITE_OTHER): Payer: Medicare Other | Admitting: Urology

## 2017-10-07 VITALS — BP 145/73 | HR 96 | Ht 73.0 in | Wt 238.0 lb

## 2017-10-07 DIAGNOSIS — N390 Urinary tract infection, site not specified: Secondary | ICD-10-CM

## 2017-10-07 DIAGNOSIS — R1909 Other intra-abdominal and pelvic swelling, mass and lump: Secondary | ICD-10-CM

## 2017-10-07 DIAGNOSIS — R339 Retention of urine, unspecified: Secondary | ICD-10-CM | POA: Diagnosis not present

## 2017-10-07 LAB — URINALYSIS, COMPLETE
Bilirubin, UA: NEGATIVE
Glucose, UA: NEGATIVE
Ketones, UA: NEGATIVE
Leukocytes, UA: NEGATIVE
Nitrite, UA: NEGATIVE
Protein, UA: NEGATIVE
Specific Gravity, UA: 1.015 (ref 1.005–1.030)
Urobilinogen, Ur: 0.2 mg/dL (ref 0.2–1.0)
pH, UA: 6 (ref 5.0–7.5)

## 2017-10-07 LAB — MICROSCOPIC EXAMINATION
Bacteria, UA: NONE SEEN
Epithelial Cells (non renal): NONE SEEN /hpf (ref 0–10)

## 2017-10-07 NOTE — Progress Notes (Signed)
10/07/2017 9:57 AM   Terry BoardsFrank E Vaughn 08/12/1932 [id number]  Referring provider: Allegra GranaArnett, [name], FNP [address] [place], [place] [id number]  Chief Complaint  Patient presents with  . Follow-up    70month    HPI: 84 year old male with urinary retention and history of urosepsis, severe epididmoorchitis who returns today for recheck.    He [facility] with a low-grade fever and a cough.  He was diagnosed with a UTI.  His UA was nitrite positive.  He was treated with Bactrim which he just completed.  He denies any fever or chills.  Overall, continues to do well with energy and mobility.    No further fever or chills.  He is now voiding 4-5 times daily now and as cut back cathing to tid.  Review of voiding diary reveals voided volumes randing from 75 - 250 cc with post void caths from 95-300 cc.  He is voiding approximately 1/2 of volume on most occations.    PMH: Past Medical History:  Diagnosis Date  . Bone spur 1988   left heel  . Bursitis 1987   right shoulder  . Cellulitis 1986   right leg  . Pneumonia    noted 07/17/17 CXR   . Shingles 1959  . Urinary retention   . UTI (urinary tract infection)    sepsis E coli UTI +urine and blood cultures 07/17/17     Surgical History: Past Surgical History:  Procedure Laterality Date  . APPENDECTOMY  1942  . CHOLECYSTECTOMY  1966  . EYE SURGERY  2015   catarcts extracted  . JOINT REPLACEMENT     right hip 20 + years from 2019, left hip replaced 05/01/17   . MELANOMA EXCISION  04.2010   mole removal right arm  . TONSILLECTOMY AND ADENOIDECTOMY  1944  . TOTAL HIP ARTHROPLASTY  1999   right hip    Home Medications:  Allergies as of 10/07/2017      Reactions   Codeine Other (See Comments)   Alters mental state      Medication List        Accurate as of 10/07/17  9:57 AM. Always use your most recent med list.          aspirin 81 MG chewable tablet Chew 81 mg by mouth daily.   CENTRUM SILVER ULTRA MENS Tabs Take 1 each by mouth daily.   DERMACLOUD Crea Apply topically.   feeding supplement (ENSURE ENLIVE) Liqd Take 237 mLs by mouth 2 (two) times daily between meals.   finasteride 5 MG tablet Commonly known as:  PROSCAR Take 1 tablet (5 mg total) by mouth daily.   potassium chloride 10 MEQ CR capsule Commonly known as:  MICRO-K Take 1 capsule (10 mEq total) by mouth daily.   tamsulosin 0.4 MG Caps capsule Commonly known as:  FLOMAX Take 1 capsule (0.4 mg total) by mouth at bedtime.   torsemide 5 MG tablet Commonly known as:  DEMADEX Take 1 tablet (5 mg total) by mouth daily.   Vitamin D2 400 units Tabs Take 1 tablet by mouth daily.       Allergies:  Allergies  Allergen Reactions  . Codeine Other (See Comments)    Alters mental state    Family History: Family History  Problem Relation Age of Onset  . Clotting disorder Mother   . Heart disease Father 1156  . Cancer Sister        breast  .  Cerebral aneurysm Brother        hemmorage  . Cancer Daughter        breast    Social History:  reports that  has never smoked. he has never used smokeless tobacco. He reports that he does not drink alcohol or use drugs.  ROS: UROLOGY Frequent Urination?: No Hard to postpone urination?: No Burning/pain with urination?: No Get up at night to urinate?: No Leakage of urine?: No Urine stream starts and stops?: No Trouble starting stream?: No Do you have to strain to urinate?: No Blood in urine?: No Urinary tract infection?: No Sexually transmitted disease?: No Injury to kidneys or bladder?: No Painful intercourse?: No Weak stream?: No Erection problems?: No Penile pain?: No  Gastrointestinal Nausea?: No Vomiting?: No Indigestion/heartburn?: No Diarrhea?: No Constipation?: No  Constitutional Fever: No Night sweats?: No Weight loss?: No Fatigue?: No  Skin Skin rash/lesions?: No Itching?: No  Eyes Blurred vision?: No Double  vision?: No  Ears/Nose/Throat Sore throat?: No Sinus problems?: No  Hematologic/Lymphatic Swollen glands?: No Easy bruising?: No  Cardiovascular Leg swelling?: No Chest pain?: No  Respiratory Cough?: Yes Shortness of breath?: No  Endocrine Excessive thirst?: No  Musculoskeletal Back pain?: No Joint pain?: No  Neurological Headaches?: No Dizziness?: No  Psychologic Depression?: No Anxiety?: No  Physical Exam: BP (!) 145/73   Pulse 96   Ht 6\' 1"  (1.854 m)   Wt 238 lb (108 kg)   BMI 31.40 kg/m   Constitutional:  Alert and oriented, No acute distress.  Accompanied by wife.  Ambulating with cane but gait is much steadier than previous. HEENT: [place] AT, moist mucus membranes.  Trachea midline, no masses. Cardiovascular: No clubbing, cyanosis, or edema. Respiratory: Normal respiratory effort, no increased work of breathing. GU: Circumcised phallus.  Bilateral descended testicles.  Left testicle with resolved firmness and enlargement, now very similar to right testicle.  The left cord continues to be thickened, firm and extending into the left inguinal area.  It is nontender.  There is no associated palpable adenopathy.  Skin: No rashes, bruises or suspicious lesions. Neurologic: Grossly intact, no focal deficits, moving all 4 extremities. Psychiatric: Normal mood and affect.  Laboratory Data: Lab Results  Component Value Date   WBC 10.1 07/19/2017   HGB 11.6 (L) 07/19/2017   HCT 34.0 (L) 07/19/2017   MCV 92.1 07/19/2017   PLT 249 07/19/2017    Lab Results  Component Value Date   CREATININE 1.23 07/19/2017    Lab Results  Component Value Date   HGBA1C 5.1 04/15/2017    Urinalysis Results for orders placed or performed in visit on 10/07/17  Microscopic Examination  Result Value Ref Range   WBC, UA 0-5 0 - 5 /hpf   RBC, UA 0-2 0 - 2 /hpf   Epithelial Cells (non renal) None seen 0 - 10 /hpf   Casts Present (A) None seen /lpf   Cast Type Hyaline casts N/A     Mucus, UA Present (A) Not Estab.   Bacteria, UA None seen None seen/Few  Urinalysis, Complete  Result Value Ref Range   Specific Gravity, UA 1.015 1.005 - 1.030   pH, UA 6.0 5.0 - 7.5   Color, UA Yellow Yellow   Appearance Ur Clear Clear   Leukocytes, UA Negative Negative   Protein, UA Negative Negative/Trace   Glucose, UA Negative Negative   Ketones, UA Negative Negative   RBC, UA Trace (A) Negative   Bilirubin, UA Negative Negative   Urobilinogen,  Ur 0.2 0.2 - 1.0 mg/dL   Nitrite, UA Negative Negative   Microscopic Examination See below:     Pertinent Imaging: N/a  Assessment & Plan:     1. Urinary retention Continue self cath and given that he is voiding more spontaneously, can decrease to 2-3 times per day as needed after voiding Continue Flomax and finasteride Continues to rebound from his severe illness, may consider further evaluation for possible outlet procedure upon reassessment in 3 months, patient is unsure if he wants to undergo any general anesthesia for intervention   2.  History of left epididymitis/massive left inguinal area Resolving, testicle now back to normal Assistant firm induration in left cord extending into the left inguinal area At this point, would have expected resolution of this, recommend ultrasound for further evaluation to rule out other etiologies He is agreeable with this plan, will plan for scrotal ultrasound with special attention to the left inguinal area  Return in about 3 months (around 01/07/2018) for UA (will call with scrotal ultrasound results).  Vanna Scotland, MD  [facility] [address], [address] [place], [place] [address] [phone]

## 2017-10-12 ENCOUNTER — Ambulatory Visit (INDEPENDENT_AMBULATORY_CARE_PROVIDER_SITE_OTHER): Payer: Medicare Other | Admitting: Podiatry

## 2017-10-12 ENCOUNTER — Encounter: Payer: Self-pay | Admitting: Podiatry

## 2017-10-12 DIAGNOSIS — M2042 Other hammer toe(s) (acquired), left foot: Secondary | ICD-10-CM

## 2017-10-12 DIAGNOSIS — B351 Tinea unguium: Secondary | ICD-10-CM

## 2017-10-12 DIAGNOSIS — M79676 Pain in unspecified toe(s): Secondary | ICD-10-CM

## 2017-10-12 DIAGNOSIS — M2041 Other hammer toe(s) (acquired), right foot: Secondary | ICD-10-CM

## 2017-10-12 DIAGNOSIS — M2030 Hallux varus (acquired), unspecified foot: Secondary | ICD-10-CM

## 2017-10-12 NOTE — Progress Notes (Signed)
Complaint:  Visit Type: Patient returns to my office for continued preventative foot care services. Complaint: Patient states" my nails have grown long and thick and become painful to walk and wear shoes" Patient says he has two painful calluses on his right foot.  One callus on the inside right heel and one at tip right big toe. The patient presents for preventative foot care services. No changes to ROS  Podiatric Exam: Vascular: dorsalis pedis and posterior tibial pulses are palpable bilateral. Capillary return is immediate. Temperature gradient is WNL. Skin turgor WNL  Sensorium: Normal Semmes Weinstein monofilament test. Normal tactile sensation bilaterally. Nail Exam: Pt has thick disfigured discolored nails with subungual debris noted bilateral entire nail hallux through fifth toenails Ulcer Exam: Pre-ulcerous callus at distal aspect medial border right hallux. Orthopedic Exam: Muscle tone and strength are WNL. No limitations in general ROM. No crepitus or effusions noted. Foot type and digits show no abnormalities. Bony prominences are unremarkable. Skin: No Porokeratosis. No infection or ulcers.     Diagnosis:  Onychomycosis, , Pain in right toe, pain in left toes    Treatment & Plan Procedures and Treatment: Consent by patient was obtained for treatment procedures. The patient understood the discussion of treatment and procedures well. All questions were answered thoroughly reviewed. Debridement of mycotic and hypertrophic toenails, 1 through 5 bilateral and clearing of subungual debris. No ulceration, no infection noted. Return Visit-Office Procedure: Patient instructed to return to the office for a follow up visit 3 months for continued evaluation and treatment.    Derico Mitton DPM 

## 2017-10-13 ENCOUNTER — Ambulatory Visit: Payer: Medicare Other | Admitting: Family

## 2017-10-14 ENCOUNTER — Encounter: Payer: Self-pay | Admitting: Family

## 2017-10-14 ENCOUNTER — Ambulatory Visit: Payer: Medicare Other | Admitting: Family

## 2017-10-14 VITALS — BP 145/80 | HR 78 | Temp 97.9°F | Resp 16 | Wt 250.4 lb

## 2017-10-14 DIAGNOSIS — N138 Other obstructive and reflux uropathy: Secondary | ICD-10-CM | POA: Diagnosis not present

## 2017-10-14 DIAGNOSIS — N401 Enlarged prostate with lower urinary tract symptoms: Secondary | ICD-10-CM

## 2017-10-14 DIAGNOSIS — I10 Essential (primary) hypertension: Secondary | ICD-10-CM | POA: Diagnosis not present

## 2017-10-14 DIAGNOSIS — N289 Disorder of kidney and ureter, unspecified: Secondary | ICD-10-CM | POA: Diagnosis not present

## 2017-10-14 DIAGNOSIS — M1612 Unilateral primary osteoarthritis, left hip: Secondary | ICD-10-CM | POA: Diagnosis not present

## 2017-10-14 MED ORDER — FINASTERIDE 5 MG PO TABS: 5.0000 mg | ORAL_TABLET | Freq: Every day | ORAL | 1 refills | Status: DC

## 2017-10-14 MED ORDER — TAMSULOSIN HCL 0.4 MG PO CAPS: 0.4000 mg | ORAL_CAPSULE | Freq: Every day | ORAL | 1 refills | Status: DC

## 2017-10-14 NOTE — Assessment & Plan Note (Signed)
Doing well  post replacement.  Pleased to see patient is using cane , and ambulating.  Will follow

## 2017-10-14 NOTE — Assessment & Plan Note (Addendum)
Resolved.  Advised to stop torsemide, potassium as not having any edema.  Patient understands to call and let me know if he has swelling.

## 2017-10-14 NOTE — Assessment & Plan Note (Signed)
Doing well on current regimen.  Please that he only having to self cath twice a day, down from 4 times.  He is able to urinate on his own.  He continues to follow with urology, Dr. Apolinar JunesBrandon.

## 2017-10-14 NOTE — Patient Instructions (Addendum)
Trial stop demadex ( fluid pill) AND potassium. Let me know if any swelling in legs   May stop ASA due to bruising and as we discussed, the absence of strong indication of you to be on asa at this time.   Monitor blood pressure,  Slightly elevated today.   Goal is less than 130/80; if persistently higher, please make sooner follow up appointment so we can recheck you blood pressure and manage medications.   Managing Your Hypertension Hypertension is commonly called high blood pressure. This is when the force of your blood pressing against the walls of your arteries is too strong. Arteries are blood vessels that carry blood from your heart throughout your body. Hypertension forces the heart to work harder to pump blood, and may cause the arteries to become narrow or stiff. Having untreated or uncontrolled hypertension can cause heart attack, stroke, kidney disease, and other problems. What are blood pressure readings? A blood pressure reading consists of a higher number over a lower number. Ideally, your blood pressure should be below 120/80. The first ("top") number is called the systolic pressure. It is a measure of the pressure in your arteries as your heart beats. The second ("bottom") number is called the diastolic pressure. It is a measure of the pressure in your arteries as the heart relaxes. What does my blood pressure reading mean? Blood pressure is classified into four stages. Based on your blood pressure reading, your health care provider may use the following stages to determine what type of treatment you need, if any. Systolic pressure and diastolic pressure are measured in a unit called mm Hg. Normal  Systolic pressure: below 120.  Diastolic pressure: below 80. Elevated  Systolic pressure: 120-129.  Diastolic pressure: below 80. Hypertension stage 1  Systolic pressure: 130-139.  Diastolic pressure: 80-89. Hypertension stage 2  Systolic pressure: 140 or  above.  Diastolic pressure: 90 or above. What health risks are associated with hypertension? Managing your hypertension is an important responsibility. Uncontrolled hypertension can lead to:  A heart attack.  A stroke.  A weakened blood vessel (aneurysm).  Heart failure.  Kidney damage.  Eye damage.  Metabolic syndrome.  Memory and concentration problems.  What changes can I make to manage my hypertension? Hypertension can be managed by making lifestyle changes and possibly by taking medicines. Your health care provider will help you make a plan to bring your blood pressure within a normal range. Eating and drinking  Eat a diet that is high in fiber and potassium, and low in salt (sodium), added sugar, and fat. An example eating plan is called the DASH (Dietary Approaches to Stop Hypertension) diet. To eat this way: ? Eat plenty of fresh fruits and vegetables. Try to fill half of your plate at each meal with fruits and vegetables. ? Eat whole grains, such as whole wheat pasta, brown rice, or whole grain bread. Fill about one quarter of your plate with whole grains. ? Eat low-fat diary products. ? Avoid fatty cuts of meat, processed or cured meats, and poultry with skin. Fill about one quarter of your plate with lean proteins such as fish, chicken without skin, beans, eggs, and tofu. ? Avoid premade and processed foods. These tend to be higher in sodium, added sugar, and fat.  Reduce your daily sodium intake. Most people with hypertension should eat less than 1,500 mg of sodium a day.  Limit alcohol intake to no more than 1 drink a day for nonpregnant women and 2 drinks  a day for men. One drink equals 12 oz of beer, 5 oz of wine, or 1 oz of hard liquor. Lifestyle  Work with your health care provider to maintain a healthy body weight, or to lose weight. Ask what an ideal weight is for you.  Get at least 30 minutes of exercise that causes your heart to beat faster (aerobic  exercise) most days of the week. Activities may include walking, swimming, or biking.  Include exercise to strengthen your muscles (resistance exercise), such as weight lifting, as part of your weekly exercise routine. Try to do these types of exercises for 30 minutes at least 3 days a week.  Do not use any products that contain nicotine or tobacco, such as cigarettes and e-cigarettes. If you need help quitting, ask your health care provider.  Control any long-term (chronic) conditions you have, such as high cholesterol or diabetes. Monitoring  Monitor your blood pressure at home as told by your health care provider. Your personal target blood pressure may vary depending on your medical conditions, your age, and other factors.  Have your blood pressure checked regularly, as often as told by your health care provider. Working with your health care provider  Review all the medicines you take with your health care provider because there may be side effects or interactions.  Talk with your health care provider about your diet, exercise habits, and other lifestyle factors that may be contributing to hypertension.  Visit your health care provider regularly. Your health care provider can help you create and adjust your plan for managing hypertension. Will I need medicine to control my blood pressure? Your health care provider may prescribe medicine if lifestyle changes are not enough to get your blood pressure under control, and if:  Your systolic blood pressure is 130 or higher.  Your diastolic blood pressure is 80 or higher.  Take medicines only as told by your health care provider. Follow the directions carefully. Blood pressure medicines must be taken as prescribed. The medicine does not work as well when you skip doses. Skipping doses also puts you at risk for problems. Contact a health care provider if:  You think you are having a reaction to medicines you have taken.  You have repeated  (recurrent) headaches.  You feel dizzy.  You have swelling in your ankles.  You have trouble with your vision. Get help right away if:  You develop a severe headache or confusion.  You have unusual weakness or numbness, or you feel faint.  You have severe pain in your chest or abdomen.  You vomit repeatedly.  You have trouble breathing. Summary  Hypertension is when the force of blood pumping through your arteries is too strong. If this condition is not controlled, it may put you at risk for serious complications.  Your personal target blood pressure may vary depending on your medical conditions, your age, and other factors. For most people, a normal blood pressure is less than 120/80.  Hypertension is managed by lifestyle changes, medicines, or both. Lifestyle changes include weight loss, eating a healthy, low-sodium diet, exercising more, and limiting alcohol. This information is not intended to replace advice given to you by your health care provider. Make sure you discuss any questions you have with your health care provider. Document Released: 04/07/2012 Document Revised: 06/11/2016 Document Reviewed: 06/11/2016 Elsevier Interactive Patient Education  [employer].

## 2017-10-14 NOTE — Progress Notes (Signed)
Subjective:    Patient ID: Terry Vaughn, male    DOB: July 02, 1933, 84 y.o.   MRN: [id number]  CC: Terry Vaughn is a 84 y.o. male who presents today for follow up.   HPI: Feeling well today. Accompanied by wife.   Walking better now after left hip replacement by Dr Odis Luster and pleased with home PT.   Able to urinate on his own, pleased with progress; will straight cath twice per day ( had been doing self cath 4 times per day). Great improvement from skilled nursing when wearing foley bag.   At skilled nursing , started on demadex. No LE swelling at this. Has seen Dr Wyn Quaker in the past for laser ablation.  No h/o CHF.   A lot of bruising on ASA. No medication for HTN. Denies exertional chest pain or pressure, numbness or tingling radiating to left arm or jaw, palpitations, dizziness, frequent headaches, changes in vision, or shortness of breath.    Dr Apolinar Junes -urology - self cathing, flomax, finasteride; epididymitis. Pending US testicle.   UTI  And URI at urgent care - on bactrim with improvement. Also treated with tamiflu. States resolved.   Had hip replacement 04/2018 and then in rehab.  ED for sepsis, AR[facility] 04/2017  Sepsis 06/2017 Hospital follow up   [facility] 05/26/18.   ARF has resolved.      Crt 1.23 06/2017  HISTORY:  Past Medical History:  Diagnosis Date  . Bone spur 1988   left heel  . Bursitis 1987   right shoulder  . Cellulitis 1986   right leg  . Pneumonia    noted 07/17/17 CXR   . Shingles 1959  . Urinary retention   . UTI (urinary tract infection)    sepsis E coli UTI +urine and blood cultures 07/17/17    Past Surgical History:  Procedure Laterality Date  . APPENDECTOMY  1942  . CHOLECYSTECTOMY  1966  . EYE SURGERY  2015   catarcts extracted  . JOINT REPLACEMENT     right hip 20 + years from 2019, left hip replaced 05/01/17   . MELANOMA EXCISION  04.2010   mole removal right arm  . TONSILLECTOMY AND ADENOIDECTOMY  1944  . TOTAL HIP  ARTHROPLASTY  1999   right hip   Family History  Problem Relation Age of Onset  . Clotting disorder Mother   . Heart disease Father 99  . Cancer Sister        breast  . Cerebral aneurysm Brother        hemmorage  . Cancer Daughter        breast    Allergies: Codeine Current Outpatient Medications on File Prior to Visit  Medication Sig Dispense Refill  . aspirin 81 MG chewable tablet Chew 81 mg by mouth daily.    . Ergocalciferol (VITAMIN D2) 400 units TABS Take 1 tablet by mouth daily.    . Multiple Vitamins-Minerals (CENTRUM SILVER ULTRA MENS) TABS Take 1 each by mouth daily.     No current facility-administered medications on file prior to visit.     Social History   Tobacco Use  . Smoking status: Never Smoker  . Smokeless tobacco: Never Used  Substance Use Topics  . Alcohol use: No    Alcohol/week: 0.0 oz  . Drug use: No    Review of Systems  Constitutional: Negative for chills and fever.  Respiratory: Negative for cough, shortness of breath and wheezing.   Cardiovascular: Negative for chest pain,  palpitations and leg swelling.  Gastrointestinal: Negative for nausea and vomiting.  Musculoskeletal: Negative for back pain and gait problem.      Objective:    BP (!) 145/80   Pulse 78   Temp 97.9 F (36.6 C) (Oral)   Resp 16   Wt 250 lb 6 oz (113.6 kg)   SpO2 95%   BMI 33.03 kg/m  BP Readings from Last 3 Encounters:  10/14/17 (!) 145/80  10/07/17 (!) 145/73  08/18/17 (!) 165/74   Wt Readings from Last 3 Encounters:  10/14/17 250 lb 6 oz (113.6 kg)  10/07/17 238 lb (108 kg)  08/18/17 251 lb 3.2 oz (113.9 kg)    Physical Exam  Constitutional: He appears well-developed and well-nourished.  Cardiovascular: Regular rhythm and normal heart sounds.  No LE edema, palpable cords or masses. No erythema or increased warmth. No asymmetry in calf size when compared bilaterally LE hair growth symmetric and present. No discoloration of varicosities noted. LE  warm and palpable pedal pulses.   Pulmonary/Chest: Effort normal and breath sounds normal. No respiratory distress. He has no wheezes. He has no rhonchi. He has no rales.  Neurological: He is alert.  Skin: Skin is warm and dry.  Psychiatric: He has a normal mood and affect. His speech is normal and behavior is normal.  Vitals reviewed.      Assessment & Plan:   Problem List Items Addressed This Visit      Cardiovascular and Mediastinum   Hypertension    Blood pressure mildly elevated today.  Patient would like to try lifestyle measures including low salt and exercise program.  Advise very close vigilance and explained the importance of monitoring blood pressure to reduce cardiovascular risk.    Of note, we discussed literature on aspirin and his age group.  He notes a lot of bruising on his arms, we discussed that his age (beyond 51 years) and taking for another 10 years , the risk of bleeding may outweigh known benefit from aspirin.  At this time we jointly decided that he will stop aspirin therapy in the absence of history of stroke, heart attack, stenting, or other risk factors for cardiovascular disease beyond hypertension, age, sex.  Patient, wife, and I felt comfortable with this decision and can readdress at future visits.        Musculoskeletal and Integument   Primary osteoarthritis of left hip    Doing well  post replacement.  Pleased to see patient is using cane , and ambulating.  Will follow        Genitourinary   Acute renal insufficiency    Resolved.  Advised to stop torsemide, potassium as not having any edema.  Patient understands to call and let me know if he has swelling.      BPH with urinary obstruction - Primary    Doing well on current regimen.  Please that he only having to self cath twice a day, down from 4 times.  He is able to urinate on his own.  He continues to follow with urology, Dr. Apolinar Junes.      Relevant Medications   finasteride (PROSCAR) 5 MG  tablet   tamsulosin (FLOMAX) 0.4 MG CAPS capsule       I have discontinued Terry Vaughn's DERMACLOUD, feeding supplement (ENSURE ENLIVE), torsemide, and potassium chloride. I am also having him maintain his CENTRUM SILVER ULTRA MENS, aspirin, Vitamin D2, finasteride, and tamsulosin.   Meds ordered this encounter  Medications  . finasteride (  PROSCAR) 5 MG tablet    Sig: Take 1 tablet (5 mg total) by mouth daily.    Dispense:  90 tablet    Refill:  1  . tamsulosin (FLOMAX) 0.4 MG CAPS capsule    Sig: Take 1 capsule (0.4 mg total) by mouth at bedtime.    Dispense:  90 capsule    Refill:  1    Return precautions given.   Risks, benefits, and alternatives of the medications and treatment plan prescribed today were discussed, and patient expressed understanding.   Education regarding symptom management and diagnosis given to patient on AVS.  Continue to follow with Allegra GranaArnett, [name], FNP for routine health maintenance.   Barkley BoardsFrank E Sainato and I agreed with plan.   Rennie PlowmanMargaret [name], FNP

## 2017-10-14 NOTE — Assessment & Plan Note (Addendum)
Blood pressure mildly elevated today.  Patient would like to try lifestyle measures including low salt and exercise program.  Advise very close vigilance and explained the importance of monitoring blood pressure to reduce cardiovascular risk.    Of note, we discussed literature on aspirin and his age group.  He notes a lot of bruising on his arms, we discussed that his age (beyond 7259 years) and taking for another 10 years , the risk of bleeding may outweigh known benefit from aspirin.  At this time we jointly decided that he will stop aspirin therapy in the absence of history of stroke, heart attack, stenting, or other risk factors for cardiovascular disease beyond hypertension, age, sex.  Patient, wife, and I felt comfortable with this decision and can readdress at future visits.

## 2017-10-15 ENCOUNTER — Ambulatory Visit
Admission: RE | Admit: 2017-10-15 | Discharge: 2017-10-15 | Disposition: A | Discharge status: Home / Self Care | Payer: Medicare Other | Source: Ambulatory Visit | Attending: Urology | Admitting: Urology

## 2017-10-15 DIAGNOSIS — R1909 Other intra-abdominal and pelvic swelling, mass and lump: Secondary | ICD-10-CM | POA: Insufficient documentation

## 2017-10-16 ENCOUNTER — Telehealth: Payer: Self-pay | Admitting: Urology

## 2017-10-16 DIAGNOSIS — R599 Enlarged lymph nodes, unspecified: Secondary | ICD-10-CM

## 2017-10-16 NOTE — Telephone Encounter (Signed)
Please let this patient know that the ultrasound did not give a clear picture of what is going on in your scrotum.  I recommended CT of the pelvis.  This was ordered.  Please let him know.  I will call him with these results.  Vanna ScotlandAshley Lindee Leason, MD

## 2017-10-19 NOTE — Telephone Encounter (Signed)
LMOM

## 2017-10-20 NOTE — Telephone Encounter (Signed)
Spoke with patient's wife Terry Vaughn and told her what the message said. His CT scan is scheduled for 10-27-17  michelle

## 2017-10-27 ENCOUNTER — Ambulatory Visit
Admission: RE | Admit: 2017-10-27 | Discharge: 2017-10-27 | Disposition: A | Discharge status: Home / Self Care | Payer: Medicare Other | Source: Ambulatory Visit | Attending: Urology | Admitting: Urology

## 2017-10-27 DIAGNOSIS — K4090 Unilateral inguinal hernia, without obstruction or gangrene, not specified as recurrent: Secondary | ICD-10-CM | POA: Diagnosis not present

## 2017-10-27 DIAGNOSIS — N400 Enlarged prostate without lower urinary tract symptoms: Secondary | ICD-10-CM | POA: Diagnosis not present

## 2017-10-27 DIAGNOSIS — R599 Enlarged lymph nodes, unspecified: Secondary | ICD-10-CM | POA: Diagnosis present

## 2017-10-27 DIAGNOSIS — I700 Atherosclerosis of aorta: Secondary | ICD-10-CM | POA: Insufficient documentation

## 2017-10-27 LAB — POCT I-STAT CREATININE: Creatinine, Ser: 1.2 mg/dL (ref 0.61–1.24)

## 2017-10-27 MED ORDER — IOHEXOL 300 MG/ML  SOLN
100.0000 mL | Freq: Once | INTRAMUSCULAR | Status: AC | PRN
  Administered 2017-10-27: 100 mL via INTRAVENOUS

## 2017-10-28 ENCOUNTER — Telehealth: Payer: Self-pay

## 2017-10-28 NOTE — Telephone Encounter (Signed)
-----   Message from Vanna ScotlandAshley Brandon, MD sent at 10/28/2017  3:43 PM EDT ----- Please that this patient know that his CT scan looks fine.  He has a inguinal hernia on the right but nothing obvious on the left.  It is probably just still thickening from his cord.  Nothing concerning at this point.  I will see him in a few months as scheduled.  Vanna ScotlandAshley Brandon, MD

## 2017-10-28 NOTE — Telephone Encounter (Signed)
Letter sent.

## 2017-11-30 ENCOUNTER — Ambulatory Visit (INDEPENDENT_AMBULATORY_CARE_PROVIDER_SITE_OTHER): Payer: Medicare Other

## 2017-11-30 VITALS — BP 138/66 | HR 75 | Temp 98.3°F | Resp 15 | Ht 73.0 in | Wt 252.8 lb

## 2017-11-30 DIAGNOSIS — Z0000 Encounter for general adult medical examination without abnormal findings: Secondary | ICD-10-CM | POA: Diagnosis not present

## 2017-11-30 NOTE — Progress Notes (Addendum)
Subjective:   Terry Vaughn is a 84 y.o. male who presents for Medicare Annual (Subsequent) preventive examination.  Review of Systems:  No ROS.  Medicare Wellness Visit. Additional risk factors are reflected in the social history.  Cardiac Risk Factors include: male gender;obesity (BMI >30kg/m2);hypertension     Objective:     Vitals: BP 138/66 (BP Location: Left Arm, Patient Position: Sitting, Cuff Size: Normal)   Pulse 75   Temp 98.3 F (36.8 C) (Oral)   Resp 15   Ht  (1.854 m)   Wt 252 lb 12.8 oz (114.7 kg)   SpO2 97%   BMI 33.35 kg/m   Body mass index is 33.35 kg/m.  Advanced Directives 11/30/2017 07/17/2017 07/17/2017 06/17/2017 06/09/2017 06/02/2017 05/20/2017  Does Patient Have a Medical Advance Directive? Yes Yes Yes No No No Yes  Type of Estate agent of [place];Living will Healthcare Power of Attorney - - - - Midwife;Living will  Does patient want to make changes to medical advance directive? No - Patient declined No - Patient declined - No - Patient declined - No - Patient declined No - Patient declined  Copy of Healthcare Power of Attorney in Chart? Yes Yes - - - - No - copy requested    Tobacco Social History   Tobacco Use  Smoking Status Never Smoker  Smokeless Tobacco Never Used     Counseling given: Not Answered   Clinical Intake:  Pre-visit preparation completed: Yes  Pain : No/denies pain     Nutritional Status: BMI > 30  Obese Diabetes: No  How often do you need to have someone help you when you read instructions, pamphlets, or other written materials from your doctor or pharmacy?: 1 - Never  Interpreter Needed?: No     Past Medical History:  Diagnosis Date  . Bone spur 1988   left heel  . Bursitis 1987   right shoulder  . Cellulitis 1986   right leg  . Pneumonia    noted 07/17/17 CXR   . Shingles 1959  . Urinary retention   . UTI (urinary tract infection)    sepsis E coli  UTI +urine and blood cultures 07/17/17    Past Surgical History:  Procedure Laterality Date  . APPENDECTOMY  1942  . CHOLECYSTECTOMY  1966  . EYE SURGERY  2015   catarcts extracted  . JOINT REPLACEMENT     right hip 20 + years from 2019, left hip replaced 05/01/17   . MELANOMA EXCISION  04.2010   mole removal right arm  . TONSILLECTOMY AND ADENOIDECTOMY  1944  . TOTAL HIP ARTHROPLASTY  1999   right hip   Family History  Problem Relation Age of Onset  . Clotting disorder Mother   . Heart disease Father 38  . Cancer Sister        breast  . Cerebral aneurysm Brother        hemmorage  . Cancer Daughter        breast   Social History   Socioeconomic History  . Marital status: Married    Spouse name: Terry Vaughn  . Number of children: 3  . Years of education: 73  . Highest education level: Some college, no degree  Occupational History  . Not on file  Social Needs  . Financial resource strain: Not hard at all  . Food insecurity:    Worry: Never true    Inability: Never true  . Transportation needs:  Medical: No    Non-medical: No  Tobacco Use  . Smoking status: Never Smoker  . Smokeless tobacco: Never Used  Substance and Sexual Activity  . Alcohol use: No    Alcohol/week: 0.0 oz  . Drug use: No  . Sexual activity: Not Currently  Lifestyle  . Physical activity:    Days per week: Not on file    Minutes per session: Not on file  . Stress: Not on file  Relationships  . Social connections:    Talks on phone: Not on file    Gets together: Not on file    Attends religious service: Not on file    Active member of club or organization: Not on file    Attends meetings of clubs or organizations: Not on file    Relationship status: Not on file  Other Topics Concern  . Not on file  Social History Narrative   Lives in [place] with wife.   Diet - regular   Exercise - none    Outpatient Encounter Medications as of 11/30/2017  Medication Sig  . aspirin 81 MG chewable  tablet Chew 81 mg by mouth daily.  . Ergocalciferol (VITAMIN D2) 400 units TABS Take 1 tablet by mouth daily.  . finasteride (PROSCAR) 5 MG tablet Take 1 tablet (5 mg total) by mouth daily.  . Multiple Vitamins-Minerals (CENTRUM SILVER ULTRA MENS) TABS Take 1 each by mouth daily.  . tamsulosin (FLOMAX) 0.4 MG CAPS capsule Take 1 capsule (0.4 mg total) by mouth at bedtime.   No facility-administered encounter medications on file as of 11/30/2017.     Activities of Daily Living In your present state of health, do you have any difficulty performing the following activities: 11/30/2017 07/17/2017  Hearing? N N  Vision? N N  Difficulty concentrating or making decisions? N N  Walking or climbing stairs? Y N  Comment Self pace -  Dressing or bathing? N N  Doing errands, shopping? N N  Preparing Food and eating ? N -  Using the Toilet? N -  In the past six months, have you accidently leaked urine? N -  Comment Cathether; self -  Do you have problems with loss of bowel control? N -  Managing your Medications? Y -  Comment Managed by wife -  Managing your Finances? N -  Housekeeping or managing your Housekeeping? N -  Some recent data might be hidden    Patient Care Team: Terry Grana, FNP as PCP - General (Family Medicine)    Assessment:   This is a routine wellness examination for Terry Vaughn.  The goal of the wellness visit is to assist the patient how to close the gaps in care and create a preventative care plan for the patient.   The roster of all physicians providing medical care to patient is listed in the Snapshot section of the chart.  Taking calcium VIT D as appropriate/Osteoporosis risk reviewed.    Safety issues reviewed; Smoke and carbon monoxide detectors in the home. No firearms in the home. Wears seatbelts when driving or riding with others. No violence in the home.  They do not have excessive sun exposure.  Discussed the need for sun protection: hats, long sleeves and  the use of sunscreen if there is significant sun exposure.  Patient is alert, normal appearance, oriented to person/place/and time.  Correctly identified the president of the [place] and recalls of 3/3 words. Performs simple calculations and can read correct time from watch face. Displays  appropriate judgement.  No new identified risk were noted.  No failures at ADL's or IADL's.  Ambulates with cane.   BMI- discussed the importance of a healthy diet, water intake and the benefits of aerobic exercise. Educational material provided.   24 hour diet recall: Regular diet  Eye- Visual acuity not assessed per patient preference since they have regular follow up with the ophthalmologist.  Wears corrective lenses.  Sleep patterns- Sleeps without issues.    Health maintenance gaps- closed.  Patient Concerns: None at this time. Follow up with PCP as needed.  Exercise Activities and Dietary recommendations Current Exercise Habits: Home exercise routine, Type of exercise: walking, Time (Minutes): 20, Frequency (Times/Week): 2, Weekly Exercise (Minutes/Week): 40, Intensity: Mild  Goals    . Increase physical activity     Walk for exercise       Fall Risk Fall Risk  11/30/2017 11/27/2016 05/08/2016 11/28/2015 03/24/2014  Falls in the past year? No No No No No   Depression Screen PHQ 2/9 Scores 11/30/2017 11/27/2016 11/28/2015 03/24/2014  PHQ - 2 Score 0 1 0 0  PHQ- 9 Score - 2 - -     Cognitive Function MMSE - Mini Mental State Exam 11/28/2015  Orientation to time 5  Orientation to Place 5  Registration 3  Attention/ Calculation 5  Recall 3  Language- name 2 objects 2  Language- repeat 1  Language- follow 3 step command 3  Language- read & follow direction 1  Write a sentence 1  Copy design 1  Total score 30     6CIT Screen 11/30/2017 11/27/2016  What Year? 0 points 0 points  What month? 0 points 0 points  What time? 0 points 0 points  Count back from 20 0 points 0 points  Months in reverse  0 points 0 points  Repeat phrase 0 points 0 points  Total Score 0 0    Immunization History  Administered Date(s) Administered  . Hepatitis B 07/06/1992  . Influenza Nasal 03/29/2011  . Influenza Split 05/02/2012, 05/11/2014  . Influenza Whole 05/28/2008  . Influenza, High Dose Seasonal PF 04/15/2017  . Influenza-Unspecified 05/02/2015, 05/06/2016  . Pneumococcal Conjugate-13 03/24/2014  . Pneumococcal Polysaccharide-23 05/28/2000  . Td 05/28/2008  . Tdap 12/27/2010  . Zoster 05/29/2007   Screening Tests Health Maintenance  Topic Date Due  . INFLUENZA VACCINE  02/25/2018  . TETANUS/TDAP  12/26/2020  . PNA vac Low Risk Adult  Completed      Plan:    End of life planning; Advance aging; Advanced directives discussed. Copy of current HCPOA/Living Will on file.    I have personally reviewed and noted the following in the patient's chart:   . Medical and social history . Use of alcohol, tobacco or illicit drugs  . Current medications and supplements . Functional ability and status . Nutritional status . Physical activity . Advanced directives . List of other physicians . Hospitalizations, surgeries, and ER visits in previous 12 months . Vitals . Screenings to include cognitive, depression, and falls . Referrals and appointments  In addition, I have reviewed and discussed with patient certain preventive protocols, quality metrics, and best practice recommendations. A written personalized care plan for preventive services as well as general preventive health recommendations were provided to patient.     Ashok Pall, LPN  08/02/1094  Agree with plan. Rennie Plowman, NP

## 2017-11-30 NOTE — Patient Instructions (Addendum)
Terry Vaughn , Thank you for taking time to come for your Medicare Wellness Visit. I appreciate your ongoing commitment to your health goals. Please review the following plan we discussed and let me know if I can assist you in the future.   Follow up as needed.    Have a great day! These are the goals we discussed: Goals    . Increase physical activity     Walk for exercise       This is a list of the screening recommended for you and due dates:  Health Maintenance  Topic Date Due  . Flu Shot  02/25/2018  . Tetanus Vaccine  12/26/2020  . Pneumonia vaccines  Completed    Hearing Loss Hearing loss is a partial or total loss of the ability to hear. This can be temporary or permanent, and it can happen in one or both ears. Hearing loss may be referred to as deafness. Medical care is necessary to treat hearing loss properly and to prevent the condition from getting worse. Your hearing may partially or completely come back, depending on what caused your hearing loss and how severe it is. In some cases, hearing loss is permanent. What are the causes? Common causes of hearing loss include:  Too much wax in the ear canal.  Infection of the ear canal or middle ear.  Fluid in the middle ear.  Injury to the ear or surrounding area.  An object stuck in the ear.  Prolonged exposure to loud sounds, such as music.  Less common causes of hearing loss include:  Tumors in the ear.  Viral or bacterial infections, such as meningitis.  A hole in the eardrum (perforated eardrum).  Problems with the hearing nerve that sends signals between the brain and the ear.  Certain medicines.  What are the signs or symptoms? Symptoms of this condition may include:  Difficulty telling the difference between sounds.  Difficulty following a conversation when there is background noise.  Lack of response to sounds in your environment. This may be most noticeable when you do not respond to startling  sounds.  Needing to turn up the volume on the television, radio, etc.  Ringing in the ears.  Dizziness.  Pain in the ears.  How is this diagnosed? This condition is diagnosed based on a physical exam and a hearing test (audiometry). The audiometry test will be performed by a hearing specialist (audiologist). You may also be referred to an ear, nose, and throat (ENT) specialist (otolaryngologist). How is this treated? Treatment for recent onset of hearing loss may include:  Ear wax removal.  Being prescribed medicines to prevent infection (antibiotics).  Being prescribed medicines to reduce inflammation (corticosteroids).  Follow these instructions at home:  If you were prescribed an antibiotic medicine, take it as told by your health care provider. Do not stop taking the antibiotic even if you start to feel better.  Take over-the-counter and prescription medicines only as told by your health care provider.  Avoid loud noises.  Return to your normal activities as told by your health care provider. Ask your health care provider what activities are safe for you.  Keep all follow-up visits as told by your health care provider. This is important. Contact a health care provider if:  You feel dizzy.  You develop new symptoms.  You vomit or feel nauseous.  You have a fever. Get help right away if:  You develop sudden changes in your vision.  You have severe  ear pain.  You have new or increased weakness.  You have a severe headache. This information is not intended to replace advice given to you by your health care provider. Make sure you discuss any questions you have with your health care provider. Document Released: 07/14/2005 Document Revised: 12/20/2015 Document Reviewed: 11/29/2014 Elsevier Interactive Patient Education  2018 [employer].

## 2018-01-07 ENCOUNTER — Encounter: Payer: Self-pay | Admitting: Urology

## 2018-01-07 ENCOUNTER — Ambulatory Visit (INDEPENDENT_AMBULATORY_CARE_PROVIDER_SITE_OTHER): Payer: Medicare Other | Admitting: Urology

## 2018-01-07 VITALS — BP 157/73 | HR 77 | Ht 73.0 in | Wt 262.0 lb

## 2018-01-07 DIAGNOSIS — N390 Urinary tract infection, site not specified: Secondary | ICD-10-CM | POA: Diagnosis not present

## 2018-01-07 DIAGNOSIS — N138 Other obstructive and reflux uropathy: Secondary | ICD-10-CM | POA: Diagnosis not present

## 2018-01-07 DIAGNOSIS — Z87898 Personal history of other specified conditions: Secondary | ICD-10-CM | POA: Diagnosis not present

## 2018-01-07 DIAGNOSIS — N401 Enlarged prostate with lower urinary tract symptoms: Secondary | ICD-10-CM | POA: Diagnosis not present

## 2018-01-07 LAB — URINALYSIS, COMPLETE
Bilirubin, UA: NEGATIVE
Glucose, UA: NEGATIVE
Ketones, UA: NEGATIVE
Nitrite, UA: NEGATIVE
Protein, UA: NEGATIVE
Specific Gravity, UA: 1.015 (ref 1.005–1.030)
Urobilinogen, Ur: 0.2 mg/dL (ref 0.2–1.0)
pH, UA: 6.5 (ref 5.0–7.5)

## 2018-01-07 LAB — MICROSCOPIC EXAMINATION

## 2018-01-07 NOTE — Progress Notes (Signed)
01/07/2018 9:48 AM   Terry Vaughn 03/01/1933 [id number]  Referring provider: Allegra GranaArnett, [name], FNP [address] [place], [place] [id number]  Chief Complaint  Patient presents with  . Recurrent UTI    31month    HPI: 84 year old male with a history of urinary retention, history of urosepsis and severe epididymitis who returns today for routine follow-up.  He is now voiding symptoms spontaneously but continues to self cath twice daily.  They bring a voiding diary with him today indicating that he voids anywhere up to 400 cc at a time.  Postvoid residuals are generally less than 100 cc on most occations.  He has had one more elevated PVR back in March of approximately 300 after voiding to 275.  Overall, he feels like he is doing very well.  No recent urinary tract infections.  No testicular complaints today.  No inguinal complaints.  No dysuria, fevers, chills, urgency, frequency.  He does have nocturia but drinks water.  He continues on Flomax/finasteride.   PMH: Past Medical History:  Diagnosis Date  . Bone spur 1988   left heel  . Bursitis 1987   right shoulder  . Cellulitis 1986   right leg  . Pneumonia    noted 07/17/17 CXR   . Shingles 1959  . Urinary retention   . UTI (urinary tract infection)    sepsis E coli UTI +urine and blood cultures 07/17/17     Surgical History: Past Surgical History:  Procedure Laterality Date  . APPENDECTOMY  1942  . CHOLECYSTECTOMY  1966  . EYE SURGERY  2015   catarcts extracted  . JOINT REPLACEMENT     right hip 20 + years from 2019, left hip replaced 05/01/17   . MELANOMA EXCISION  04.2010   mole removal right arm  . TONSILLECTOMY AND ADENOIDECTOMY  1944  . TOTAL HIP ARTHROPLASTY  1999   right hip    Home Medications:  Allergies as of 01/07/2018      Reactions   Codeine Other (See Comments)   Alters mental state      Medication List        Accurate as of 01/07/18  9:48 AM. Always use your most recent med  list.          aspirin 81 MG chewable tablet Chew 81 mg by mouth daily.   CENTRUM SILVER ULTRA MENS Tabs Take 1 each by mouth daily.   finasteride 5 MG tablet Commonly known as:  PROSCAR Take 1 tablet (5 mg total) by mouth daily.   tamsulosin 0.4 MG Caps capsule Commonly known as:  FLOMAX Take 1 capsule (0.4 mg total) by mouth at bedtime.   Vitamin D2 400 units Tabs Take 1 tablet by mouth daily.       Allergies:  Allergies  Allergen Reactions  . Codeine Other (See Comments)    Alters mental state    Family History: Family History  Problem Relation Age of Onset  . Clotting disorder Mother   . Heart disease Father [id number]  . Cancer Sister        breast  . Cerebral aneurysm Brother        hemmorage  . Cancer Daughter        breast    Social History:  reports that he has never smoked. He has never used smokeless tobacco. He reports that he does not drink alcohol or use drugs.  ROS: UROLOGY Frequent Urination?: No Hard to postpone urination?: No Burning/pain  with urination?: No Get up at night to urinate?: Yes Leakage of urine?: No Urine stream starts and stops?: No Trouble starting stream?: No Do you have to strain to urinate?: No Blood in urine?: No Urinary tract infection?: No Sexually transmitted disease?: No Injury to kidneys or bladder?: No Painful intercourse?: No Weak stream?: No Erection problems?: No Penile pain?: No  Gastrointestinal Nausea?: No Vomiting?: No Indigestion/heartburn?: No Diarrhea?: No Constipation?: No  Constitutional Fever: No Night sweats?: No Weight loss?: No Fatigue?: No  Skin Skin rash/lesions?: No Itching?: No  Eyes Blurred vision?: No Double vision?: No  Ears/Nose/Throat Sore throat?: No Sinus problems?: No  Hematologic/Lymphatic Swollen glands?: No Easy bruising?: No  Cardiovascular Leg swelling?: No Chest pain?: No  Respiratory Cough?: No Shortness of breath?: No  Endocrine Excessive  thirst?: No  Musculoskeletal Back pain?: No Joint pain?: No  Neurological Headaches?: No Dizziness?: No  Psychologic Depression?: No Anxiety?: No  Physical Exam: BP (!) 157/73   Pulse 77   Ht 6\' 1"  (1.854 m)   Wt 262 lb (118.8 kg)   BMI 34.57 kg/m   Constitutional:  Alert and oriented, No acute distress.  Accompanied by wife today. HEENT: [place] AT, moist mucus membranes.  Trachea midline, no masses. Cardiovascular: No clubbing, cyanosis, or edema. Respiratory: Normal respiratory effort, no increased work of breathing. Skin: No rashes, bruises or suspicious lesions. Neurologic: Grossly intact, no focal deficits, moving all 4 extremities.  Ambulating with cane. Psychiatric: Normal mood and affect.  Laboratory Data: Lab Results  Component Value Date   WBC 10.1 07/19/2017   HGB 11.6 (L) 07/19/2017   HCT 34.0 (L) 07/19/2017   MCV 92.1 07/19/2017   PLT 249 07/19/2017    Lab Results  Component Value Date   CREATININE 1.20 10/27/2017    Lab Results  Component Value Date   HGBA1C 5.1 04/15/2017    Urinalysis UA reviewed, unremarkable  Pertinent Imaging: CLINICAL DATA:  Evaluate for adenopathy.  EXAM: CT PELVIS WITH CONTRAST  TECHNIQUE: Multidetector CT imaging of the pelvis was performed using the standard protocol following the bolus administration of intravenous contrast.  CONTRAST:  OMNIPAQUE IOHEXOL 300 MG/ML  SOLN  COMPARISON:  None.  FINDINGS: Urinary Tract:  No abnormality visualized.  Bowel:  Unremarkable visualized pelvic bowel loops.  Vascular/Lymphatic: Aortic atherosclerosis. No aneurysm. No pelvic or inguinal adenopathy identified.  Reproductive: Partially calcified take partially calcified and enlarged prostate gland measures 4.6 by 5.7 by 3.7 cm (volume = 51 cm^3).  Other: No free fluid or fluid collections identified. There is a right inguinal hernia which contains fat and a nonobstructed loop of small bowel, image  51/4. Smaller fat containing left inguinal hernia noted  Musculoskeletal: Previous bilateral hip arthroplasty. 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  IMPRESSION: 1. No pelvic or inguinal adenopathy identified. 2. There is a right inguinal hernia which contains fat and a nonobstructed loop of small bowel. 3.  Aortic Atherosclerosis (ICD10-I70.0). 4. Prostate gland enlargement.   Electronically Signed   By: Signa Kell M.D.   On: 10/27/2017 11:15  CT scan reviewed again today.  Assessment & Plan:    1. Benign prostatic hyperplasia with urinary obstruction Doing well on finasteride and flomax Not interested in surgical intervention  2. History of urinary retention May cut down to self cath once daily May DC self cath if volume is less than 100 cc, patient is wife are agreeable this plan - Urinalysis, Complete  3. Recurrent UTI No recent infections UA  today benign  Return in about 1 year (around 01/08/2019) for IPSS/ PVR/ UA.  Vanna Scotland, MD  [facility] [address] [place], [place] [address] [phone]

## 2018-01-14 ENCOUNTER — Encounter: Payer: Self-pay | Admitting: Podiatry

## 2018-01-14 ENCOUNTER — Ambulatory Visit (INDEPENDENT_AMBULATORY_CARE_PROVIDER_SITE_OTHER): Payer: Medicare Other | Admitting: Podiatry

## 2018-01-14 DIAGNOSIS — M79676 Pain in unspecified toe(s): Secondary | ICD-10-CM | POA: Diagnosis not present

## 2018-01-14 DIAGNOSIS — M2030 Hallux varus (acquired), unspecified foot: Secondary | ICD-10-CM

## 2018-01-14 DIAGNOSIS — M2042 Other hammer toe(s) (acquired), left foot: Secondary | ICD-10-CM

## 2018-01-14 DIAGNOSIS — B351 Tinea unguium: Secondary | ICD-10-CM | POA: Diagnosis not present

## 2018-01-14 DIAGNOSIS — M2041 Other hammer toe(s) (acquired), right foot: Secondary | ICD-10-CM

## 2018-01-14 NOTE — Progress Notes (Signed)
Complaint:  Visit Type: Patient returns to my office for continued preventative foot care services. Complaint: Patient states" my nails have grown long and thick and become painful to walk and wear shoes" Patient says he has two painful calluses on his right foot.  One callus on the inside right heel and one at tip right big toe. The patient presents for preventative foot care services. No changes to ROS  Podiatric Exam: Vascular: dorsalis pedis and posterior tibial pulses are palpable bilateral. Capillary return is immediate. Temperature gradient is WNL. Skin turgor WNL  Sensorium: Normal Semmes Weinstein monofilament test. Normal tactile sensation bilaterally. Nail Exam: Pt has thick disfigured discolored nails with subungual debris noted bilateral entire nail hallux through fifth toenails Ulcer Exam: Pre-ulcerous callus at distal aspect medial border right hallux. Orthopedic Exam: Muscle tone and strength are WNL. No limitations in general ROM. No crepitus or effusions noted. Foot type and digits show no abnormalities. Bony prominences are unremarkable. Skin: No Porokeratosis. No infection or ulcers.     Diagnosis:  Onychomycosis, , Pain in right toe, pain in left toes    Treatment & Plan Procedures and Treatment: Consent by patient was obtained for treatment procedures. The patient understood the discussion of treatment and procedures well. All questions were answered thoroughly reviewed. Debridement of mycotic and hypertrophic toenails, 1 through 5 bilateral and clearing of subungual debris. No ulceration, no infection noted. Return Visit-Office Procedure: Patient instructed to return to the office for a follow up visit 3 months for continued evaluation and treatment.    Keanu Lesniak DPM 

## 2018-04-09 ENCOUNTER — Other Ambulatory Visit: Payer: Self-pay | Admitting: Family

## 2018-04-09 DIAGNOSIS — N138 Other obstructive and reflux uropathy: Secondary | ICD-10-CM

## 2018-04-09 DIAGNOSIS — N401 Enlarged prostate with lower urinary tract symptoms: Principal | ICD-10-CM

## 2018-04-13 NOTE — Telephone Encounter (Signed)
Both last filled 01/11/18 Last office visit 10/14/17 Next office visit 04/16/18

## 2018-04-14 NOTE — Telephone Encounter (Signed)
Please call patient, advised him that he needs to have his Proscar and Flomax refilled by urology who he follows with.

## 2018-04-15 NOTE — Telephone Encounter (Signed)
Spoke to [place] wife per DPR and she states that urology is managing medications.

## 2018-04-16 ENCOUNTER — Ambulatory Visit: Payer: Medicare Other | Admitting: Family

## 2018-04-16 ENCOUNTER — Other Ambulatory Visit: Payer: Self-pay | Admitting: Family

## 2018-04-16 ENCOUNTER — Encounter: Payer: Self-pay | Admitting: Family

## 2018-04-16 VITALS — BP 134/88 | HR 64 | Temp 97.8°F | Resp 15 | Ht 73.0 in | Wt 273.0 lb

## 2018-04-16 DIAGNOSIS — R17 Unspecified jaundice: Secondary | ICD-10-CM

## 2018-04-16 DIAGNOSIS — R635 Abnormal weight gain: Secondary | ICD-10-CM | POA: Diagnosis not present

## 2018-04-16 DIAGNOSIS — I10 Essential (primary) hypertension: Secondary | ICD-10-CM

## 2018-04-16 LAB — COMPREHENSIVE METABOLIC PANEL
ALT: 28 U/L (ref 0–53)
AST: 23 U/L (ref 0–37)
Albumin: 4.3 g/dL (ref 3.5–5.2)
Alkaline Phosphatase: 49 U/L (ref 39–117)
BUN: 17 mg/dL (ref 6–23)
CO2: 32 mEq/L (ref 19–32)
Calcium: 9.4 mg/dL (ref 8.4–10.5)
Chloride: 104 mEq/L (ref 96–112)
Creatinine, Ser: 1.12 mg/dL (ref 0.40–1.50)
GFR: 66.47 mL/min (ref >60.00)
Glucose, Bld: 98 mg/dL (ref 70–99)
Potassium: 4.2 mEq/L (ref 3.5–5.1)
Sodium: 141 mEq/L (ref 135–145)
Total Bilirubin: 1.6 mg/dL — ABNORMAL HIGH (ref 0.2–1.2)
Total Protein: 6.9 g/dL (ref 6.0–8.3)

## 2018-04-16 NOTE — Patient Instructions (Signed)
Monitor blood pressure,  Goal is less than 130/80; if persistently higher, please make sooner follow up appointment so we can recheck you blood pressure and manage medications  EXERCISE -- this is the most important as we discussed today  We will check your cholesterol in early 2020 - please ensure a you have an appointment with me and are fasting

## 2018-04-16 NOTE — Progress Notes (Signed)
Subjective:    Patient ID: Terry Vaughn, male    DOB: 12/02/1932, 84 y.o.   MRN: [id number]  CC: Terry Vaughn is a 84 y.o. male who presents today for follow up.   HPI: HTN- at home 146/79. No medication. Denies exertional chest pain or pressure, numbness or tingling radiating to left arm or jaw, palpitations, dizziness, frequent headaches, changes in vision, or shortness of breath.   Le swelling has improved. 'have a little, but not much'  Sleeping in recliner in 'flat' position as 'more comfortable'. No pain in left hip since surgery.   Has gained weight,  hasnt been exercising. Eating more, and bigger portions. Couple of days ago, dug a hole for a post in yard, no CP, SOB at that time.    BPH- Stable. Dr Apolinar JunesBrandon, follow up in one year.   Stress test per patient 30years ago , normal.     HISTORY:  Past Medical History:  Diagnosis Date  . Bone spur 1988   left heel  . Bursitis 1987   right shoulder  . Cellulitis 1986   right leg  . Pneumonia    noted 07/17/17 CXR   . Shingles 1959  . Urinary retention   . UTI (urinary tract infection)    sepsis E coli UTI +urine and blood cultures 07/17/17    Past Surgical History:  Procedure Laterality Date  . APPENDECTOMY  1942  . CHOLECYSTECTOMY  1966  . EYE SURGERY  2015   catarcts extracted  . JOINT REPLACEMENT     right hip 20 + years from 2019, left hip replaced 05/01/17   . MELANOMA EXCISION  04.2010   mole removal right arm  . TONSILLECTOMY AND ADENOIDECTOMY  1944  . TOTAL HIP ARTHROPLASTY  1999   right hip   Family History  Problem Relation Age of Onset  . Clotting disorder Mother   . Heart disease Father 1456  . Cancer Sister        breast  . Cerebral aneurysm Brother        hemmorage  . Cancer Daughter        breast    Allergies: Codeine Current Outpatient Medications on File Prior to Visit  Medication Sig Dispense Refill  . aspirin 81 MG chewable tablet Chew 81 mg by mouth daily.    . Ergocalciferol  (VITAMIN D2) 400 units TABS Take 1 tablet by mouth daily.    . finasteride (PROSCAR) 5 MG tablet Take 1 tablet (5 mg total) by mouth daily. 90 tablet 1  . Multiple Vitamins-Minerals (CENTRUM SILVER ULTRA MENS) TABS Take 1 each by mouth daily.    . tamsulosin (FLOMAX) 0.4 MG CAPS capsule Take 1 capsule (0.4 mg total) by mouth at bedtime. 90 capsule 1   No current facility-administered medications on file prior to visit.     Social History   Tobacco Use  . Smoking status: Never Smoker  . Smokeless tobacco: Never Used  Substance Use Topics  . Alcohol use: No    Alcohol/week: 0.0 standard drinks  . Drug use: No    Review of Systems  Constitutional: Positive for activity change and appetite change. Negative for chills, fever and unexpected weight change.  Respiratory: Negative for cough, shortness of breath and wheezing.   Cardiovascular: Negative for chest pain, palpitations and leg swelling (improved).  Gastrointestinal: Negative for nausea and vomiting.      Objective:    BP 134/88 (BP Location: Left Arm, Patient Position: Sitting,  Cuff Size: Large)   Pulse 64   Temp 97.8 F (36.6 C) (Oral)   Resp 15   Ht 6\' 1"  (1.854 m)   Wt 273 lb (123.8 kg)   SpO2 98%   BMI 36.02 kg/m  BP Readings from Last 3 Encounters:  04/16/18 134/88  01/07/18 (!) 157/73  11/30/17 138/66   Wt Readings from Last 3 Encounters:  04/16/18 273 lb (123.8 kg)  01/07/18 262 lb (118.8 kg)  11/30/17 252 lb 12.8 oz (114.7 kg)    Physical Exam  Constitutional: He appears well-developed and well-nourished.  Cardiovascular: Regular rhythm and normal heart sounds.  Trace nonpitting BLE edema No palpable cords or masses. No erythema or increased warmth. No asymmetry in calf size when compared bilaterally LE hair growth symmetric and present. No discoloration of varicosities noted. LE warm and palpable pedal pulses.   Pulmonary/Chest: Effort normal and breath sounds normal. No respiratory distress. He  has no wheezes. He has no rhonchi. He has no rales.  Lymphadenopathy:       Head (left side): No submandibular and no preauricular adenopathy present.  Neurological: He is alert.  Skin: Skin is warm and dry.  Psychiatric: He has a normal mood and affect. His speech is normal and behavior is normal.  Vitals reviewed.      Assessment & Plan:   Problem List Items Addressed This Visit      Cardiovascular and Mediastinum   Hypertension - Primary    Improved since last OV. No medication.       Relevant Orders   Comprehensive metabolic panel     Other   Weight gain    Approximately 10 pound weight gain.  Trace bilateral lateral edema ( improved).  No evidence of lung sounds to suggest fluid volume overload such as in heart failure.  Patient admittedly states he is not exercising like he had in the past , especially since hot over the summer, and eating more.  He politely declines a cardiology referral as he denies any CP, shortness of breath, orthopnea.  He states he just sleeping in the recliner because it is comfortable.  He plans to start exercising again to lose the weight.  Will follow.          I am having [name]. [name] maintain his CENTRUM SILVER ULTRA MENS, aspirin, Vitamin D2, finasteride, and tamsulosin.   No orders of the defined types were placed in this encounter.   Return precautions given.   Risks, benefits, and alternatives of the medications and treatment plan prescribed today were discussed, and patient expressed understanding.   Education regarding symptom management and diagnosis given to patient on AVS.  Continue to follow with Allegra Grana, FNP for routine health maintenance.   Terry Boards and I agreed with plan.   Rennie Plowman, FNP

## 2018-04-16 NOTE — Assessment & Plan Note (Signed)
Improved since last OV. No medication.

## 2018-04-16 NOTE — Assessment & Plan Note (Addendum)
Approximately 10 pound weight gain.  Trace bilateral lateral edema ( improved).  No evidence of lung sounds to suggest fluid volume overload such as in heart failure.  Patient admittedly states he is not exercising like he had in the past , especially since hot over the summer, and eating more.  He politely declines a cardiology referral as he denies any CP, shortness of breath, orthopnea.  He states he just sleeping in the recliner because it is comfortable.  He plans to start exercising again to lose the weight.  Will follow.

## 2018-04-16 NOTE — Progress Notes (Unsigned)
Can I add on hepatic screen via [facility] to labs today? I ordered

## 2018-04-19 NOTE — Progress Notes (Signed)
No labs were sent to [facility] and too late to add on to the tube [facility].

## 2018-04-20 ENCOUNTER — Other Ambulatory Visit: Payer: Self-pay | Admitting: Family

## 2018-04-20 DIAGNOSIS — R17 Unspecified jaundice: Secondary | ICD-10-CM

## 2018-04-21 ENCOUNTER — Ambulatory Visit: Payer: Medicare Other

## 2018-04-21 VITALS — BP 120/72 | HR 91 | Ht 73.0 in

## 2018-04-21 DIAGNOSIS — N390 Urinary tract infection, site not specified: Secondary | ICD-10-CM

## 2018-04-21 LAB — MICROSCOPIC EXAMINATION: Epithelial Cells (non renal): NONE SEEN /hpf (ref 0–10)

## 2018-04-21 LAB — URINALYSIS, COMPLETE
Bilirubin, UA: NEGATIVE
Glucose, UA: NEGATIVE
Ketones, UA: NEGATIVE
Nitrite, UA: POSITIVE — AB
Specific Gravity, UA: 1.015 (ref 1.005–1.030)
Urobilinogen, Ur: 0.2 mg/dL (ref 0.2–1.0)
pH, UA: 7 (ref 5.0–7.5)

## 2018-04-21 MED ORDER — SULFAMETHOXAZOLE-TRIMETHOPRIM 800-160 MG PO TABS
1.0000 | ORAL_TABLET | Freq: Two times a day (BID) | ORAL | 0 refills | Status: AC
Start: 2018-04-21 — End: 2018-05-01

## 2018-04-21 NOTE — Progress Notes (Signed)
Pt is present in office today for possible uti. He is complaining of cloudy urine, frequent urination, and dysuria. Pt provided urine sample for analysis and culture. His urine analysis was significant for infection. Urine was sent for culture, per Dr Richardo Hanks, pt started on Bactrim bid for 10 days while we wait for culture results.

## 2018-04-22 ENCOUNTER — Encounter: Payer: Self-pay | Admitting: Podiatry

## 2018-04-22 ENCOUNTER — Ambulatory Visit (INDEPENDENT_AMBULATORY_CARE_PROVIDER_SITE_OTHER): Payer: Medicare Other | Admitting: Podiatry

## 2018-04-22 DIAGNOSIS — M2041 Other hammer toe(s) (acquired), right foot: Secondary | ICD-10-CM

## 2018-04-22 DIAGNOSIS — M2042 Other hammer toe(s) (acquired), left foot: Secondary | ICD-10-CM

## 2018-04-22 DIAGNOSIS — M79676 Pain in unspecified toe(s): Secondary | ICD-10-CM

## 2018-04-22 DIAGNOSIS — B351 Tinea unguium: Secondary | ICD-10-CM

## 2018-04-22 DIAGNOSIS — M2030 Hallux varus (acquired), unspecified foot: Secondary | ICD-10-CM

## 2018-04-22 NOTE — Progress Notes (Signed)
Complaint:  Visit Type: Patient returns to my office for continued preventative foot care services. Complaint: Patient states" my nails have grown long and thick and become painful to walk and wear shoes" Patient says he has two painful calluses on his right foot.  One callus on the inside right heel and one at tip right big toe. The patient presents for preventative foot care services. No changes to ROS  Podiatric Exam: Vascular: dorsalis pedis and posterior tibial pulses are palpable bilateral. Capillary return is immediate. Temperature gradient is WNL. Skin turgor WNL  Sensorium: Normal Semmes Weinstein monofilament test. Normal tactile sensation bilaterally. Nail Exam: Pt has thick disfigured discolored nails with subungual debris noted bilateral entire nail hallux through fifth toenails Ulcer Exam: Pre-ulcerous callus at distal aspect medial border right hallux. Orthopedic Exam: Muscle tone and strength are WNL. No limitations in general ROM. No crepitus or effusions noted. Foot type and digits show no abnormalities. Bony prominences are unremarkable. Skin: No Porokeratosis. No infection or ulcers.     Diagnosis:  Onychomycosis, , Pain in right toe, pain in left toes    Treatment & Plan Procedures and Treatment: Consent by patient was obtained for treatment procedures. The patient understood the discussion of treatment and procedures well. All questions were answered thoroughly reviewed. Debridement of mycotic and hypertrophic toenails, 1 through 5 bilateral and clearing of subungual debris. No ulceration, no infection noted. Return Visit-Office Procedure: Patient instructed to return to the office for a follow up visit 3 months for continued evaluation and treatment.    Matteson Blue DPM 

## 2018-04-29 ENCOUNTER — Other Ambulatory Visit (INDEPENDENT_AMBULATORY_CARE_PROVIDER_SITE_OTHER): Payer: Medicare Other

## 2018-04-29 DIAGNOSIS — R17 Unspecified jaundice: Secondary | ICD-10-CM | POA: Diagnosis not present

## 2018-04-29 LAB — HEPATIC FUNCTION PANEL
ALT: 35 U/L (ref 0–53)
AST: 35 U/L (ref 0–37)
Albumin: 4.3 g/dL (ref 3.5–5.2)
Alkaline Phosphatase: 55 U/L (ref 39–117)
Bilirubin, Direct: 0.2 mg/dL (ref 0.0–0.3)
Total Bilirubin: 1.1 mg/dL (ref 0.2–1.2)
Total Protein: 6.9 g/dL (ref 6.0–8.3)

## 2018-05-11 ENCOUNTER — Ambulatory Visit: Payer: Medicare Other

## 2018-05-11 VITALS — BP 145/67 | HR 76 | Ht 73.0 in | Wt 269.3 lb

## 2018-05-11 DIAGNOSIS — N390 Urinary tract infection, site not specified: Secondary | ICD-10-CM

## 2018-05-11 LAB — URINALYSIS, COMPLETE
Bilirubin, UA: NEGATIVE
Glucose, UA: NEGATIVE
Ketones, UA: NEGATIVE
Nitrite, UA: NEGATIVE
Protein, UA: NEGATIVE
Specific Gravity, UA: 1.015 (ref 1.005–1.030)
Urobilinogen, Ur: 0.2 mg/dL (ref 0.2–1.0)
pH, UA: 6.5 (ref 5.0–7.5)

## 2018-05-11 LAB — MICROSCOPIC EXAMINATION: Epithelial Cells (non renal): NONE SEEN /hpf (ref 0–10)

## 2018-05-11 MED ORDER — SULFAMETHOXAZOLE-TRIMETHOPRIM 800-160 MG PO TABS: 1.0000 | ORAL_TABLET | Freq: Two times a day (BID) | ORAL | 0 refills | Status: AC

## 2018-05-11 NOTE — Progress Notes (Signed)
Patient presents in office with signs and symptoms of a UTI. Patient c/o cloudy urine with foul odor. Patient has significant history of recurrent UTIs. Urinalysis completed, results reviewed by Dr.Brandon. Dr.Brandon gave verbal orders for pt to begin Septra DS, RX sent in. Urine sent for CX.

## 2018-05-17 LAB — CULTURE, URINE COMPREHENSIVE

## 2018-05-18 ENCOUNTER — Telehealth: Payer: Self-pay

## 2018-05-18 NOTE — Telephone Encounter (Signed)
Yup, he's good.   Vanna Scotland, MD

## 2018-05-18 NOTE — Telephone Encounter (Signed)
Patient notified

## 2018-05-18 NOTE — Telephone Encounter (Signed)
Pt's wife calling for results of urine cx, looks like the results are back and current antibiotic is appropriate but just want to confirm.

## 2018-07-05 ENCOUNTER — Telehealth: Payer: Self-pay | Admitting: Urology

## 2018-07-05 DIAGNOSIS — N401 Enlarged prostate with lower urinary tract symptoms: Principal | ICD-10-CM

## 2018-07-05 DIAGNOSIS — N138 Other obstructive and reflux uropathy: Secondary | ICD-10-CM

## 2018-07-05 MED ORDER — TORSEMIDE 5 MG PO TABS: ORAL_TABLET | ORAL | 3 refills | Status: DC

## 2018-07-05 MED ORDER — FINASTERIDE 5 MG PO TABS: 5.0000 mg | ORAL_TABLET | Freq: Every day | ORAL | 3 refills | Status: DC

## 2018-07-05 NOTE — Telephone Encounter (Signed)
Pt would like Brandon to write RX for Finasteride 5 mg and Tamsulosin 0.4 mg [facility] to [facility].  PCP recommended that Apolinar JunesBrandon give new RX.  He only has 1 wk left and follow up w/Brandon 6/20.

## 2018-07-08 ENCOUNTER — Other Ambulatory Visit: Payer: Self-pay | Admitting: Family Medicine

## 2018-07-08 DIAGNOSIS — N401 Enlarged prostate with lower urinary tract symptoms: Principal | ICD-10-CM

## 2018-07-08 DIAGNOSIS — N138 Other obstructive and reflux uropathy: Secondary | ICD-10-CM

## 2018-07-08 MED ORDER — TAMSULOSIN HCL 0.4 MG PO CAPS: 0.4000 mg | ORAL_CAPSULE | Freq: Every day | ORAL | 3 refills | Status: DC

## 2018-07-19 ENCOUNTER — Ambulatory Visit: Payer: Medicare Other | Admitting: Podiatry

## 2018-08-02 ENCOUNTER — Ambulatory Visit (INDEPENDENT_AMBULATORY_CARE_PROVIDER_SITE_OTHER): Payer: Medicare Other | Admitting: Podiatry

## 2018-08-02 ENCOUNTER — Encounter: Payer: Self-pay | Admitting: Podiatry

## 2018-08-02 DIAGNOSIS — M2041 Other hammer toe(s) (acquired), right foot: Secondary | ICD-10-CM

## 2018-08-02 DIAGNOSIS — M2042 Other hammer toe(s) (acquired), left foot: Secondary | ICD-10-CM

## 2018-08-02 DIAGNOSIS — M79676 Pain in unspecified toe(s): Secondary | ICD-10-CM | POA: Diagnosis not present

## 2018-08-02 DIAGNOSIS — M2030 Hallux varus (acquired), unspecified foot: Secondary | ICD-10-CM

## 2018-08-02 DIAGNOSIS — B351 Tinea unguium: Secondary | ICD-10-CM | POA: Diagnosis not present

## 2018-08-02 NOTE — Progress Notes (Signed)
Complaint:  Visit Type: Patient returns to my office for continued preventative foot care services. Complaint: Patient states" my nails have grown long and thick and become painful to walk and wear shoes" Patient says he has two painful calluses on his right foot.  One callus on the inside right heel and one at tip right big toe. The patient presents for preventative foot care services. No changes to ROS  Podiatric Exam: Vascular: dorsalis pedis and posterior tibial pulses are palpable bilateral. Capillary return is immediate. Temperature gradient is WNL. Skin turgor WNL  Sensorium: Normal Semmes Weinstein monofilament test. Normal tactile sensation bilaterally. Nail Exam: Pt has thick disfigured discolored nails with subungual debris noted bilateral entire nail hallux through fifth toenails Ulcer Exam: Pre-ulcerous callus at distal aspect medial border right hallux. Orthopedic Exam: Muscle tone and strength are WNL. No limitations in general ROM. No crepitus or effusions noted. Foot type and digits show no abnormalities. Bony prominences are unremarkable. Skin: No Porokeratosis. No infection or ulcers.     Diagnosis:  Onychomycosis, , Pain in right toe, pain in left toes    Treatment & Plan Procedures and Treatment: Consent by patient was obtained for treatment procedures. The patient understood the discussion of treatment and procedures well. All questions were answered thoroughly reviewed. Debridement of mycotic and hypertrophic toenails, 1 through 5 bilateral and clearing of subungual debris. No ulceration, no infection noted. Return Visit-Office Procedure: Patient instructed to return to the office for a follow up visit 3 months for continued evaluation and treatment.    Helane Gunther DPM

## 2018-09-15 ENCOUNTER — Ambulatory Visit (INDEPENDENT_AMBULATORY_CARE_PROVIDER_SITE_OTHER): Payer: Medicare Other

## 2018-09-15 ENCOUNTER — Telehealth: Payer: Self-pay | Admitting: Urology

## 2018-09-15 VITALS — BP 155/79 | HR 84 | Ht 74.0 in | Wt 278.0 lb

## 2018-09-15 DIAGNOSIS — R8290 Unspecified abnormal findings in urine: Secondary | ICD-10-CM | POA: Diagnosis not present

## 2018-09-15 DIAGNOSIS — R350 Frequency of micturition: Secondary | ICD-10-CM | POA: Diagnosis not present

## 2018-09-15 LAB — URINALYSIS, COMPLETE
Bilirubin, UA: NEGATIVE
Glucose, UA: NEGATIVE
Ketones, UA: NEGATIVE
Nitrite, UA: NEGATIVE
Protein, UA: NEGATIVE
Specific Gravity, UA: 1.01 (ref 1.005–1.030)
Urobilinogen, Ur: 0.2 mg/dL (ref 0.2–1.0)
pH, UA: 7 (ref 5.0–7.5)

## 2018-09-15 LAB — BLADDER SCAN AMB NON-IMAGING

## 2018-09-15 LAB — MICROSCOPIC EXAMINATION
Bacteria, UA: NONE SEEN
Epithelial Cells (non renal): NONE SEEN /hpf (ref 0–10)

## 2018-09-15 NOTE — Telephone Encounter (Signed)
Pt's wife called and said pt self caths once per day.  He is having frequency, cloudy urine and pressure.  No fever or chills.  Pt was added to nurse schedule today at 11:30.

## 2018-09-15 NOTE — Progress Notes (Signed)
Patient presents today complaining of urinary frequency and urgency. Having a hard time postponing urination. Denies nausea, pain, fever or chills. Patients wife states that urine last night was slightly darker than normal. Patient self caths at home. Patient provided urine sample. PVR . Per Carollee Herter, cath specimen needed. Cath Specimen collected by Eligha Bridegroom, CMA.. UA repeated on cath specimen. Urine sent for culture. Will call with results. Carollee Herter wants to wait on culture results before treating.

## 2018-09-16 ENCOUNTER — Telehealth: Payer: Self-pay

## 2018-09-16 MED ORDER — SULFAMETHOXAZOLE-TRIMETHOPRIM 800-160 MG PO TABS: 1.0000 | ORAL_TABLET | Freq: Two times a day (BID) | ORAL | 0 refills | Status: AC

## 2018-09-16 NOTE — Telephone Encounter (Signed)
Patients wife stopped by the office to request an antibiotic until culture comes back. Per Carollee Herter, ok to send in Bactrim BID x 5 days. Advised patients wife and will call with culture results.

## 2018-09-17 LAB — CULTURE, URINE COMPREHENSIVE

## 2018-09-20 NOTE — Telephone Encounter (Signed)
Spoke to patients wife, Garlan Fair, and relayed results from culture. Patients wife verbalized understanding.

## 2018-09-20 NOTE — Telephone Encounter (Signed)
-----   Message from Harle Battiest, PA-C sent at 09/17/2018  4:40 PM EST ----- Please let Mr. Peel know that his urine culture was positive for infection.  The Septra is the appropriate antibiotic and he should start feeling better.

## 2018-09-20 NOTE — Telephone Encounter (Signed)
Left message on machine to return call to clinic

## 2018-10-30 IMAGING — CR DG CHEST 2V
1 series · 2 of 2 positions shown · non-contrast
Comparison: Chest x-ray dated 05/20/2017.

CLINICAL DATA: Fever and slight cough.

EXAM:
CHEST  2 VIEW

[Series 1: dg chest 2 view · 0.14mm/px · 2 of 2 slices shown]
[im 1/2]
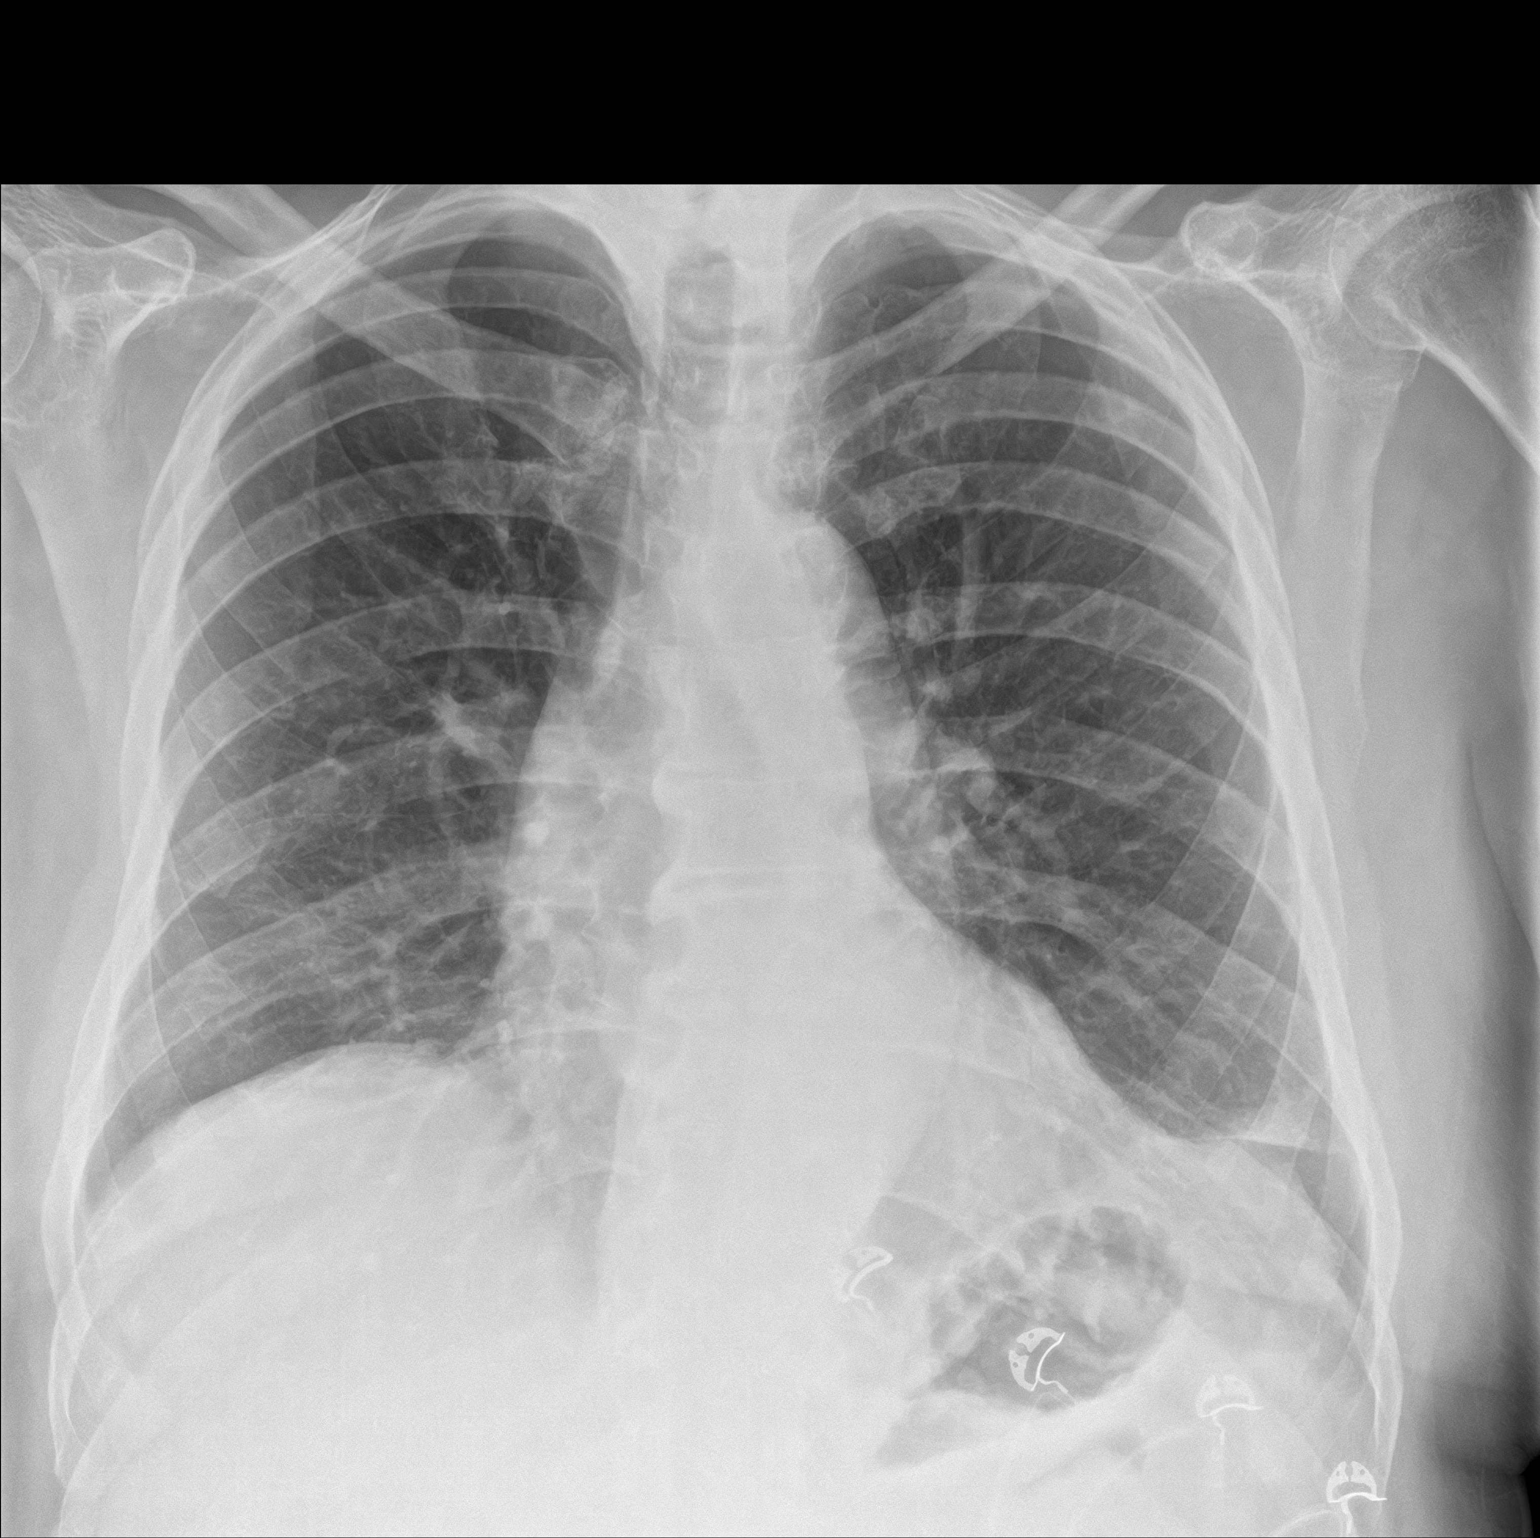
[im 2/2]
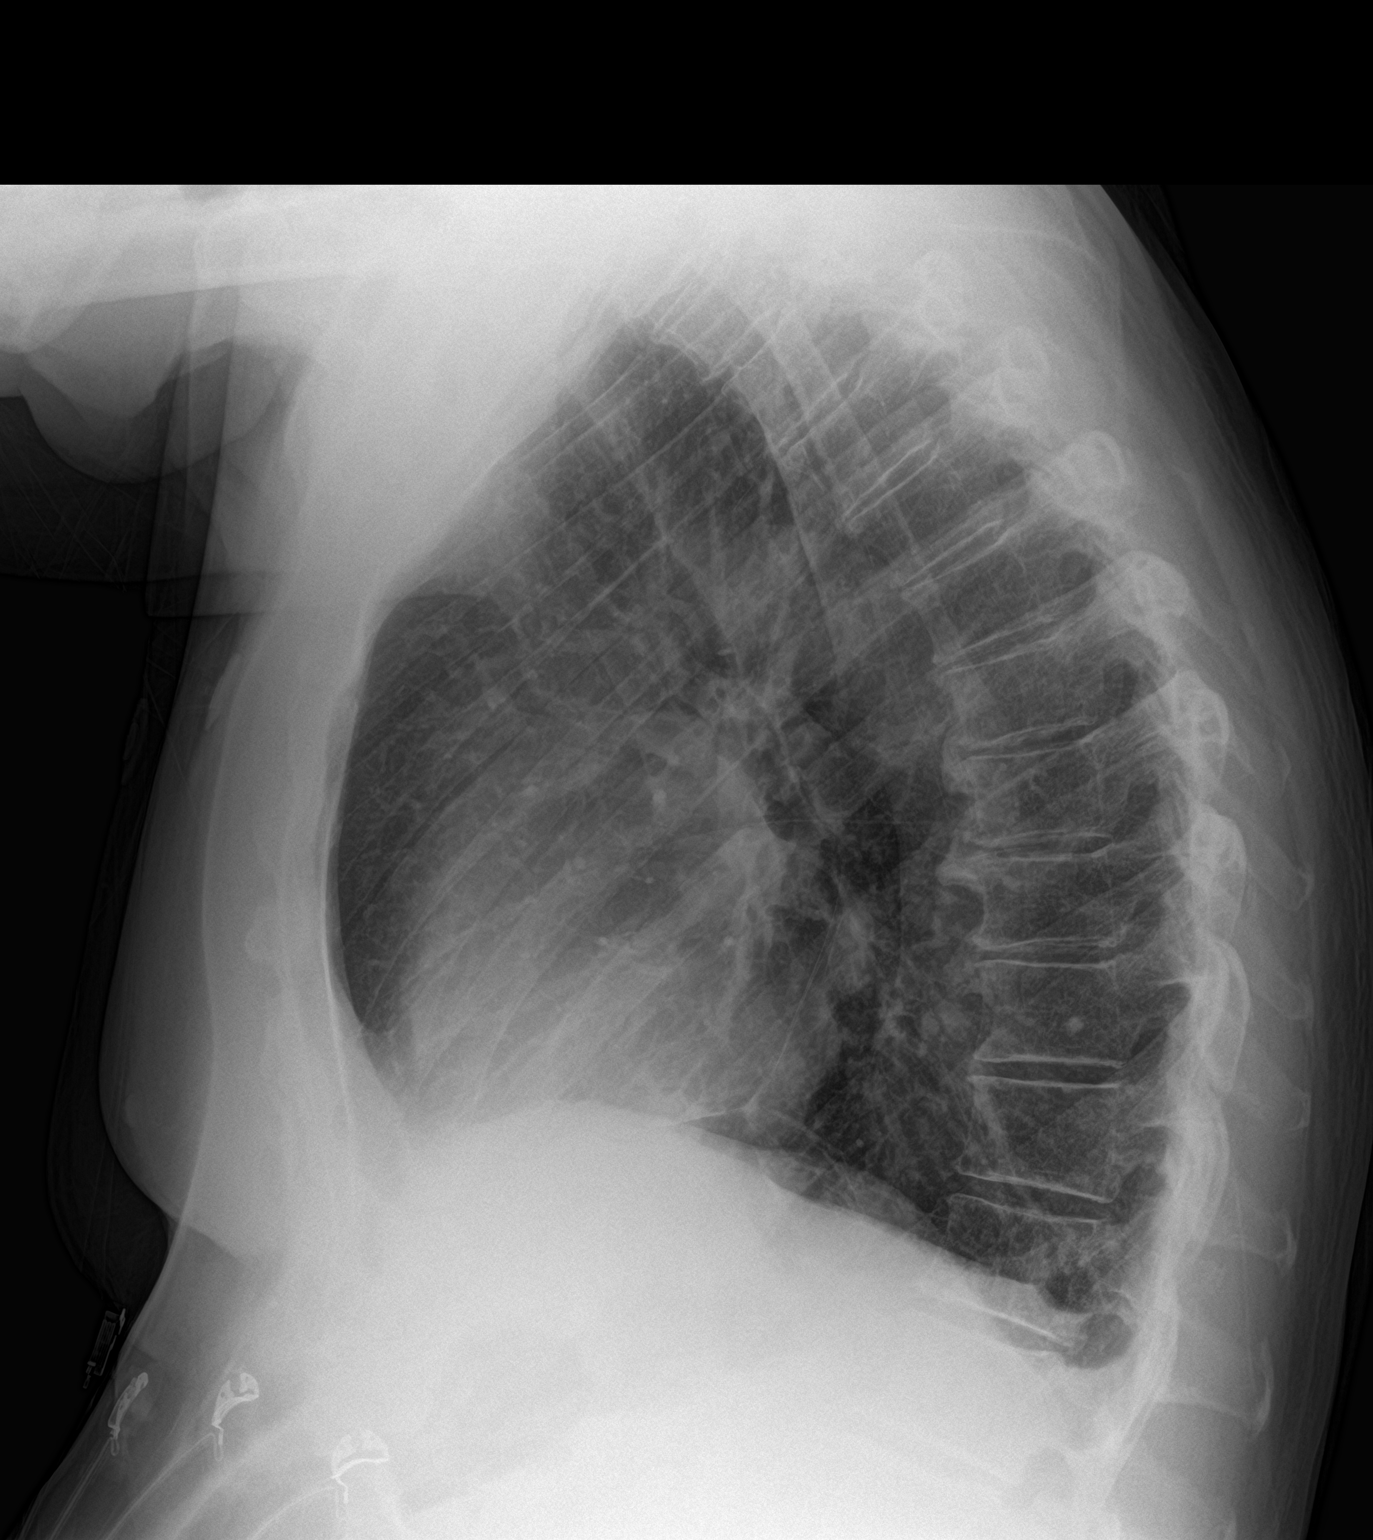

[2 of 2 positions shown; findings below may reference images not displayed]

FINDINGS: Heart size and mediastinal contours are stable. Small subtle opacity
at the left lung base, best seen on the AP view. Lungs otherwise
clear. No pleural effusion or pneumothorax seen.
IMPRESSION: Small subtle opacity at the left lung base, lingula, suspicious for
small/early pneumonia. Recommend follow-up chest x-ray to ensure
resolution.

## 2018-11-01 ENCOUNTER — Ambulatory Visit: Payer: Medicare Other | Admitting: Podiatry

## 2018-11-17 IMAGING — US US SCROTUM W/ DOPPLER COMPLETE
1 series · 13 of 25 positions shown · non-contrast
Comparison: None.

CLINICAL DATA: Scrotal pain, swelling and erythema.

EXAM:
SCROTAL ULTRASOUND
DOPPLER ULTRASOUND OF THE TESTICLES
TECHNIQUE: Complete ultrasound examination of the testicles, epididymis, and
other scrotal structures was performed. Color and spectral Doppler
ultrasound were also utilized to evaluate blood flow to the
testicles.

[Series 1: us scrotum w/ doppler complete · 0.09mm/px · 13 of 55 slices shown]
[im 1/55]
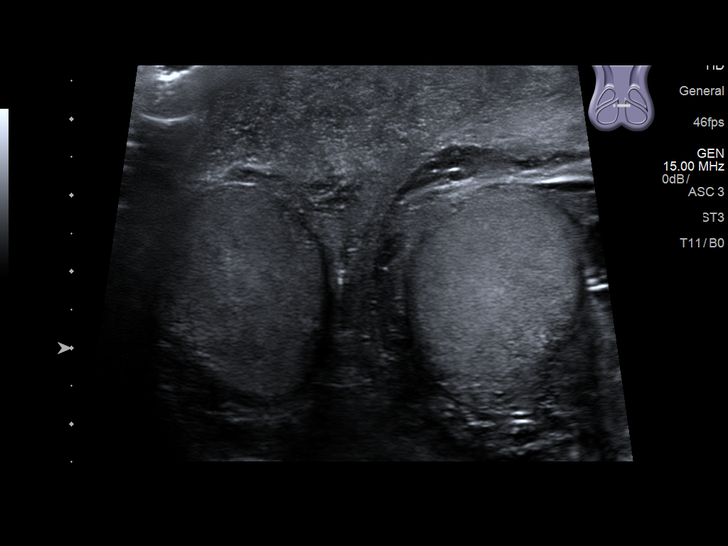
[im 5/55]
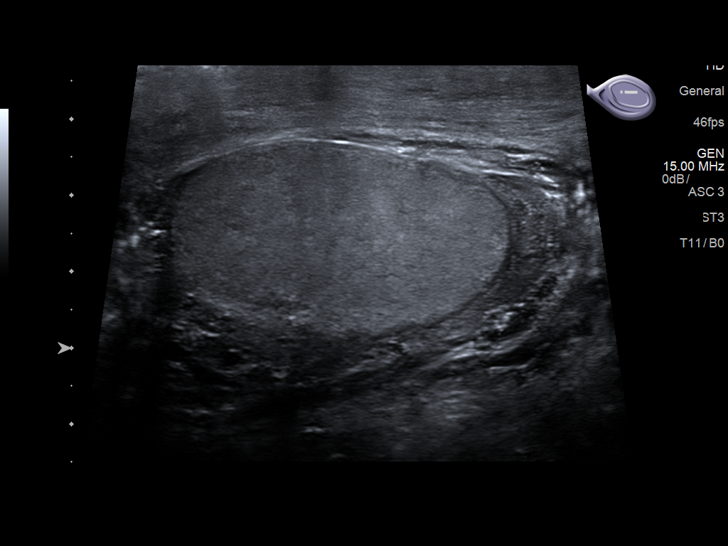
[im 10/55]
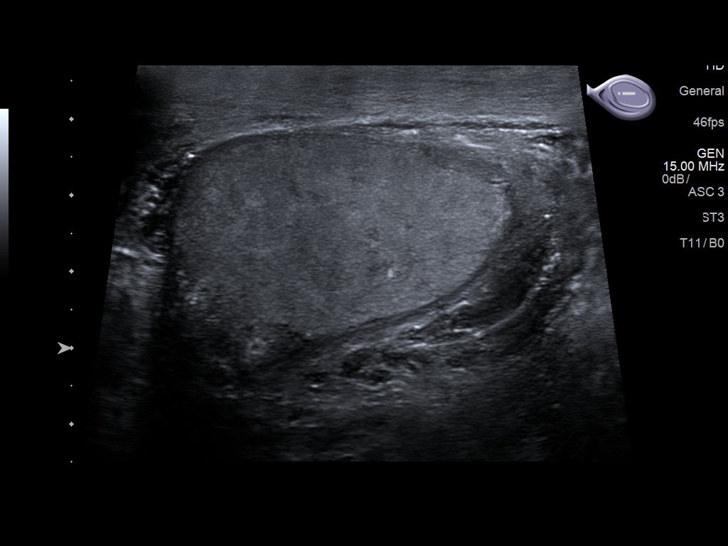
[im 14/55]
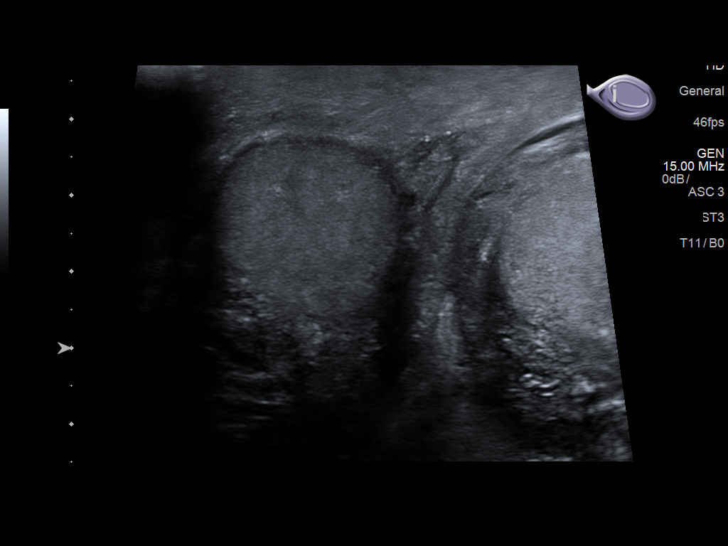
[im 19/55]
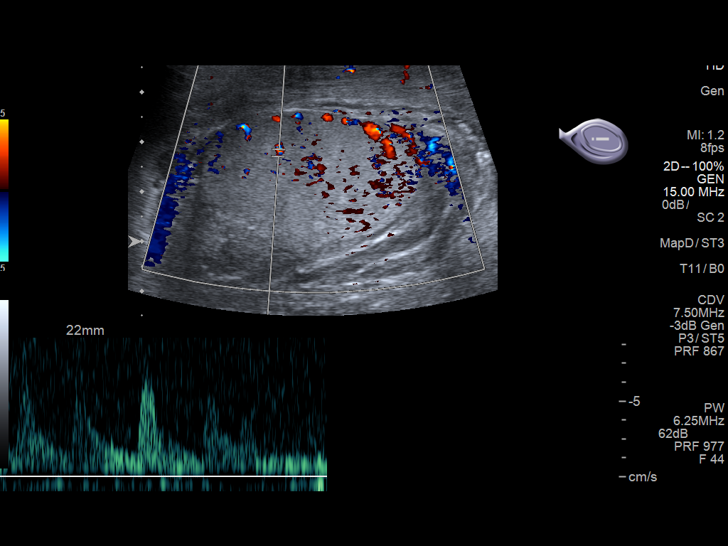
[im 23/55]
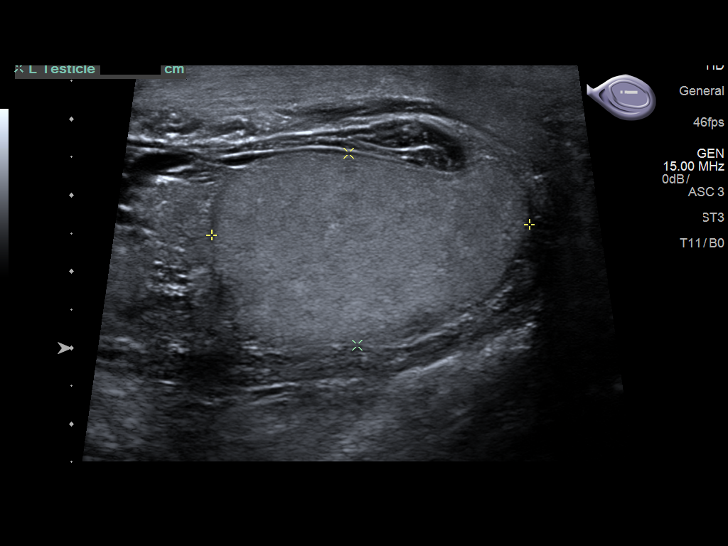
[im 28/55]
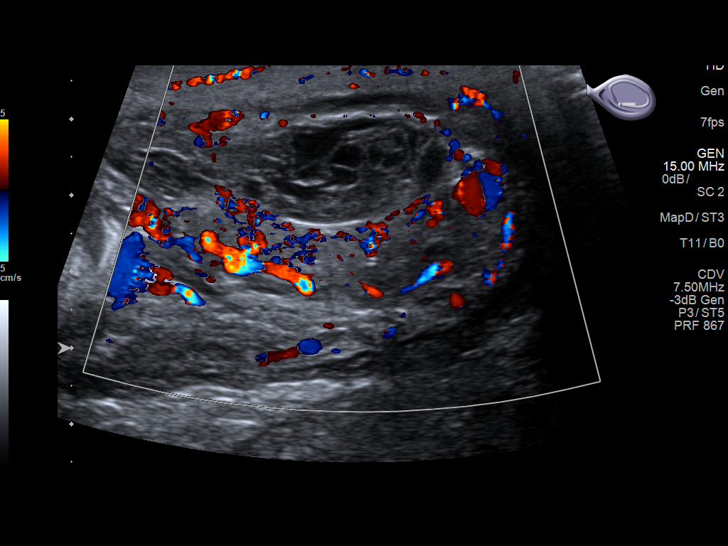
[im 32/55]
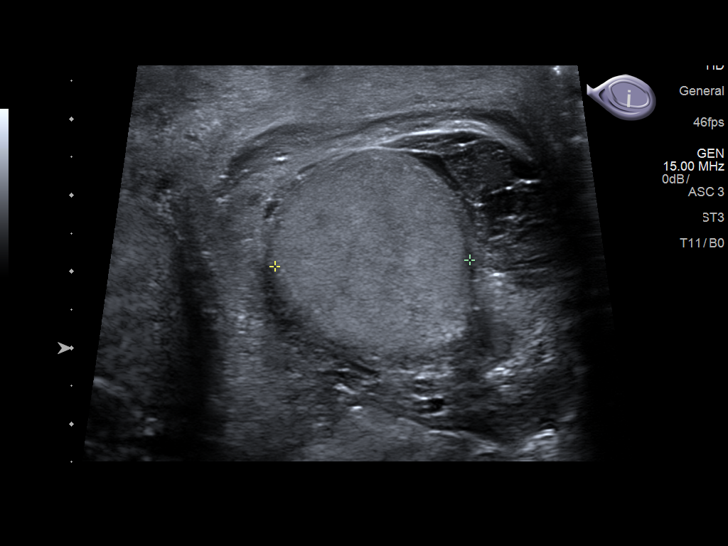
[im 37/55]
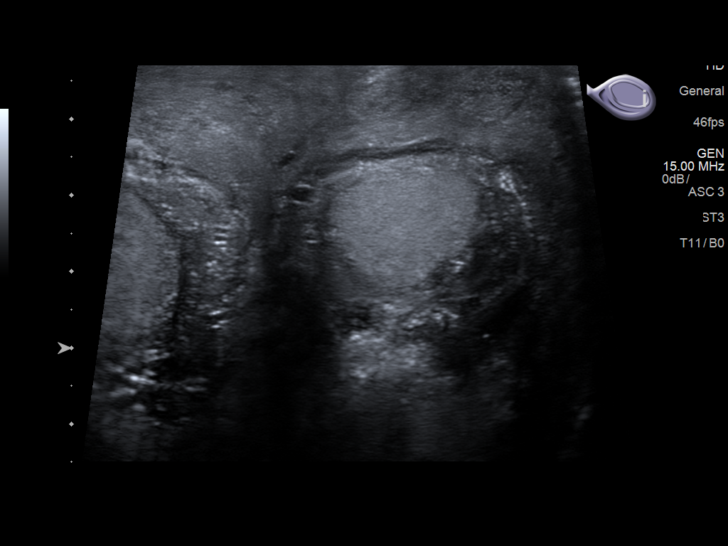
[im 41/55]
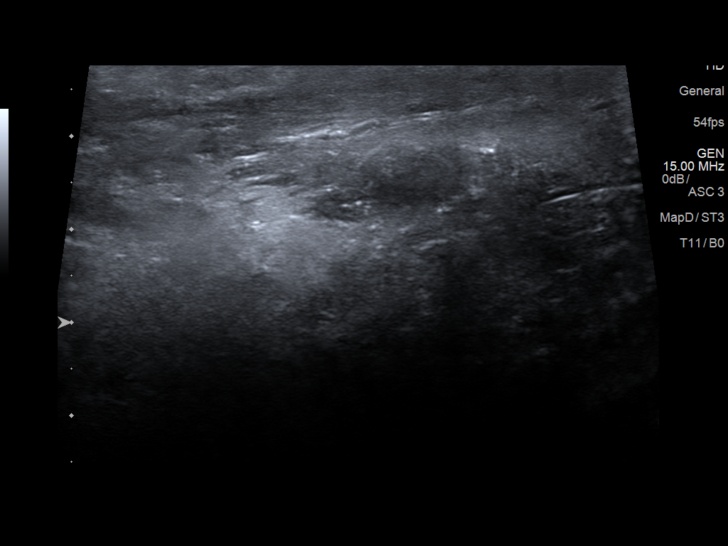
[im 46/55]
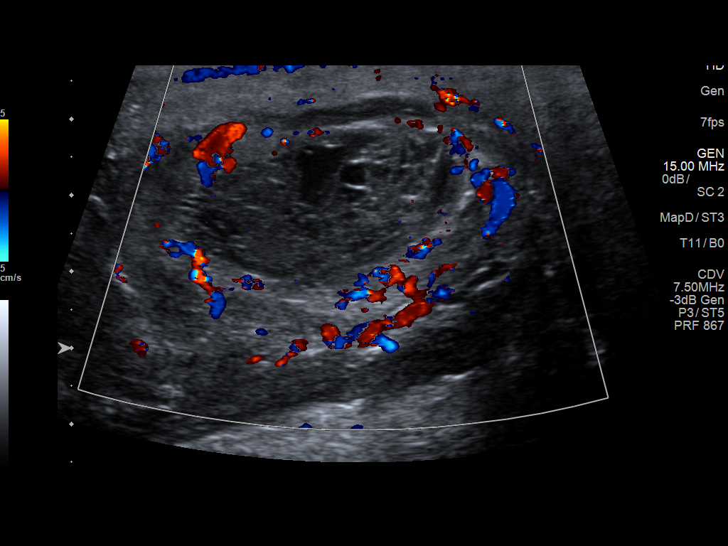
[im 50/55]
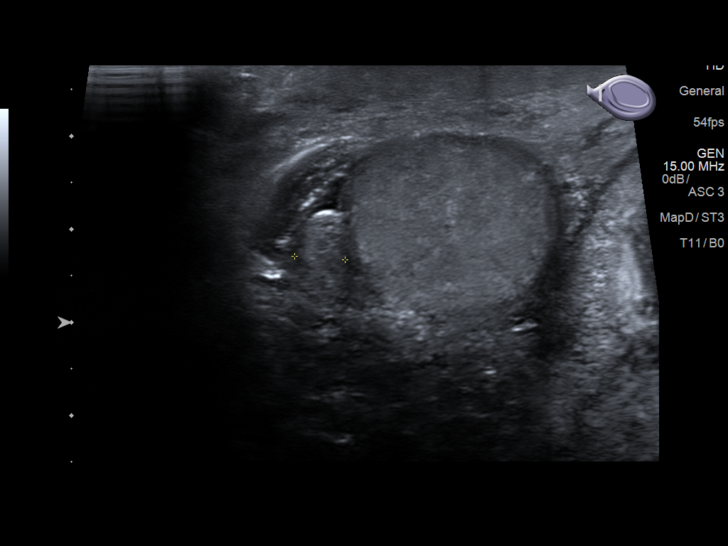
[im 55/55]
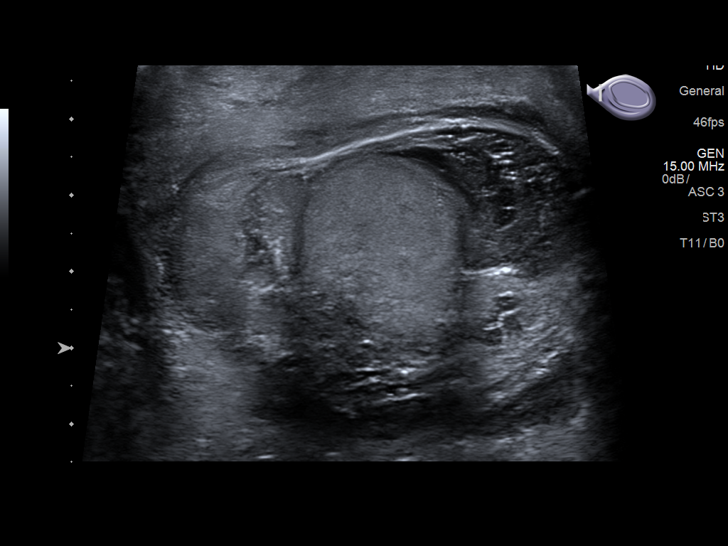

[13 of 25 positions shown; findings below may reference images not displayed]

FINDINGS: Right testicle

Measurements: 4.5 x 2.5 x 2.3 cm. No mass or microlithiasis
visualized.

Left testicle

Measurements: 4.2 x 2.6 x 2.5 cm. No mass or microlithiasis
visualized.

Right epididymis:  Normal in size and appearance.

Left epididymis:  Normal in size and appearance.

Hydrocele: Oval cystic and solid appearing area along the lateral
aspect of the left testicle measuring 3.4 x 1.6 x 1.5 cm. No
internal blood flow seen within this area with color Doppler.

Varicocele:  None visualized.

Other: Diffuse scrotal skin thickening.

Pulsed Doppler interrogation of both testes demonstrates normal low
resistance arterial and venous waveforms bilaterally.
IMPRESSION: 1. 3.4 x 1.6 x 1.5 cm probable complicated hydrocele lateral to the
left testicle. This could represent a hydrocele complicated by
infection or hemorrhage.
2. Diffuse scrotal skin thickening compatible with diffuse scrotal
cellulitis.
3. Normal appearing testicles and epididymides.

## 2018-12-03 ENCOUNTER — Ambulatory Visit: Payer: Medicare Other

## 2018-12-18 IMAGING — DX DG CHEST 2V
2 series · 2 of 2 positions shown · non-contrast
Comparison: 07/17/2017

CLINICAL DATA: Follow up xray for pneumonia. PT denies any symptoms
at this time

EXAM:
CHEST  2 VIEW

[chest pa]
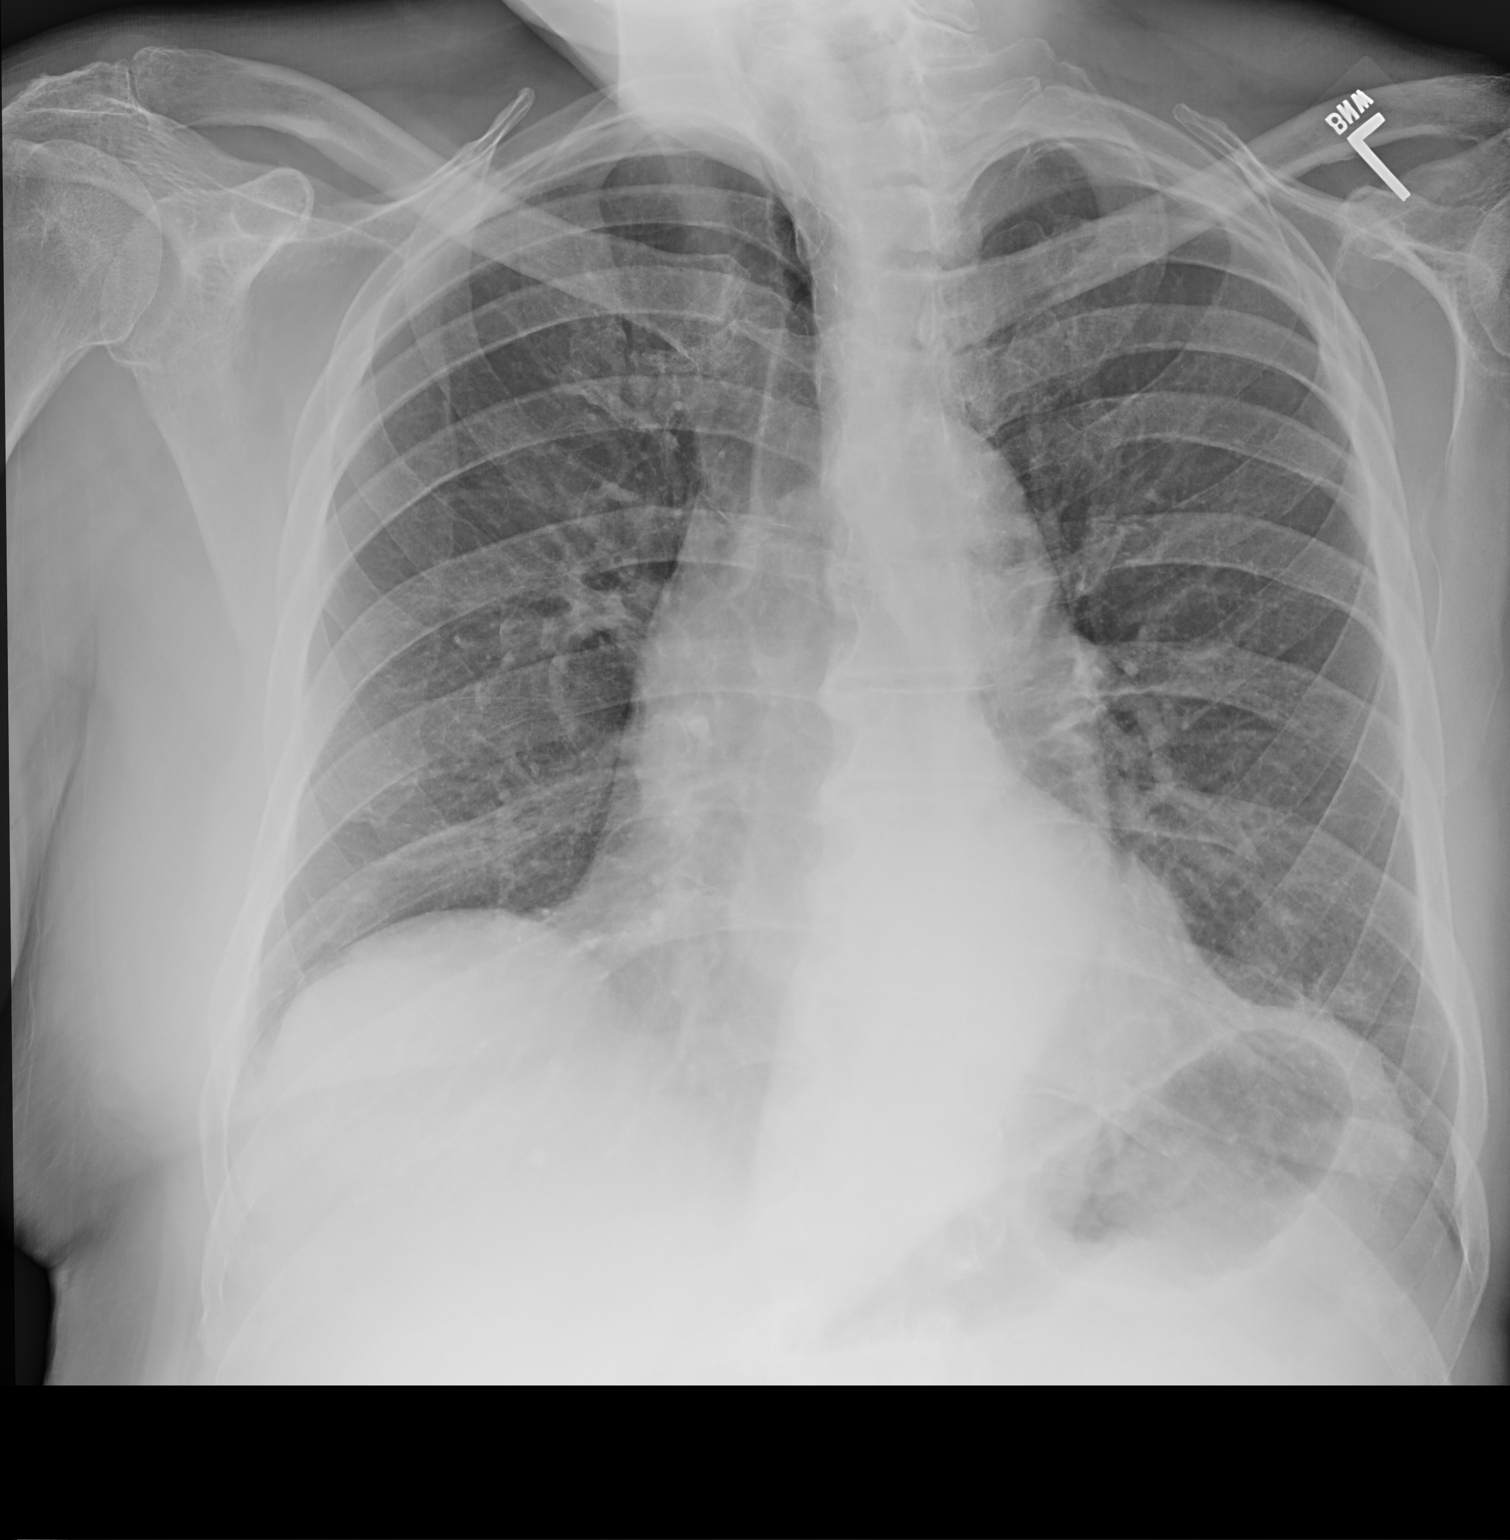

[chest lat]
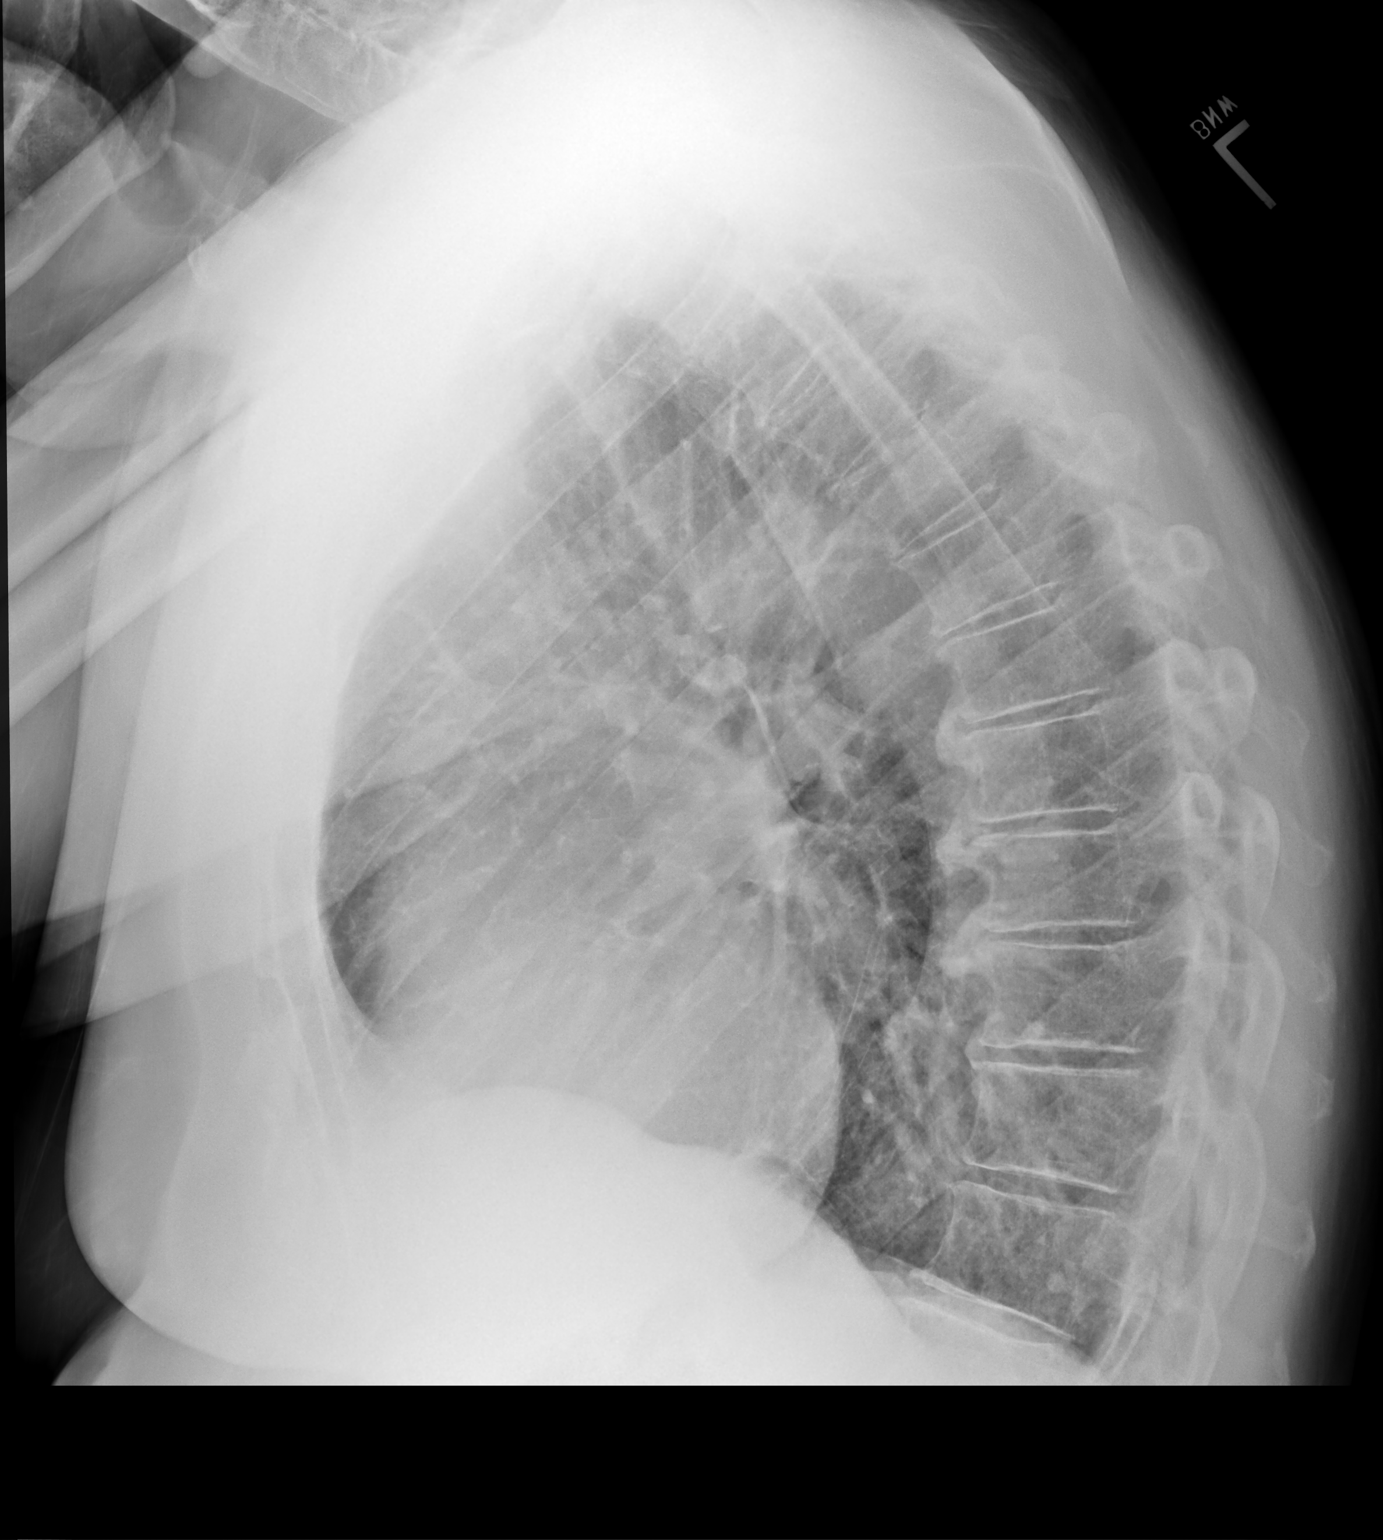

[2 of 2 positions shown; findings below may reference images not displayed]

FINDINGS: There is a small area of reticular opacity in the left upper lobe
lingula in the area of the previously seen focal ground-glass
opacity. This is likely postinflammatory atelectasis. No convincing
residual pneumonia. Remainder of the lungs is clear.

Cardiac silhouette is borderline enlarged. No mediastinal or hilar
masses. No evidence of adenopathy.

No pleural effusion or pneumothorax.

Skeletal structures are intact.
IMPRESSION: 1. No acute cardiopulmonary disease. Small focus of post
inflammatory atelectasis in the left upper lobe lingula, but no
convincing residual pneumonia.

## 2019-01-05 ENCOUNTER — Ambulatory Visit: Payer: Medicare Other | Admitting: Urology

## 2019-04-14 ENCOUNTER — Ambulatory Visit (INDEPENDENT_AMBULATORY_CARE_PROVIDER_SITE_OTHER): Payer: Medicare Other | Admitting: Podiatry

## 2019-04-14 ENCOUNTER — Encounter: Payer: Self-pay | Admitting: Podiatry

## 2019-04-14 ENCOUNTER — Other Ambulatory Visit: Payer: Self-pay

## 2019-04-14 DIAGNOSIS — M2041 Other hammer toe(s) (acquired), right foot: Secondary | ICD-10-CM

## 2019-04-14 DIAGNOSIS — M79676 Pain in unspecified toe(s): Secondary | ICD-10-CM

## 2019-04-14 DIAGNOSIS — B351 Tinea unguium: Secondary | ICD-10-CM | POA: Diagnosis not present

## 2019-04-14 DIAGNOSIS — M2030 Hallux varus (acquired), unspecified foot: Secondary | ICD-10-CM

## 2019-04-14 DIAGNOSIS — M2042 Other hammer toe(s) (acquired), left foot: Secondary | ICD-10-CM

## 2019-04-14 IMAGING — US US ART/VEN ABD/PELV/SCROTUM DOPPLER LTD
1 series · 13 of 25 positions shown · non-contrast
Comparison: None.

CLINICAL DATA: Left inguinal pain.

EXAM:
SCROTAL ULTRASOUND
DOPPLER ULTRASOUND OF THE TESTICLES
TECHNIQUE: Complete ultrasound examination of the testicles, epididymis, and
other scrotal structures was performed. Color and spectral Doppler
ultrasound were also utilized to evaluate blood flow to the
testicles.

[Series 1: us art/ven abd/pelv/scrotum doppler ltd · 0.07mm/px · 55 acquisitions, 13 frames shown]
[im 1/55]
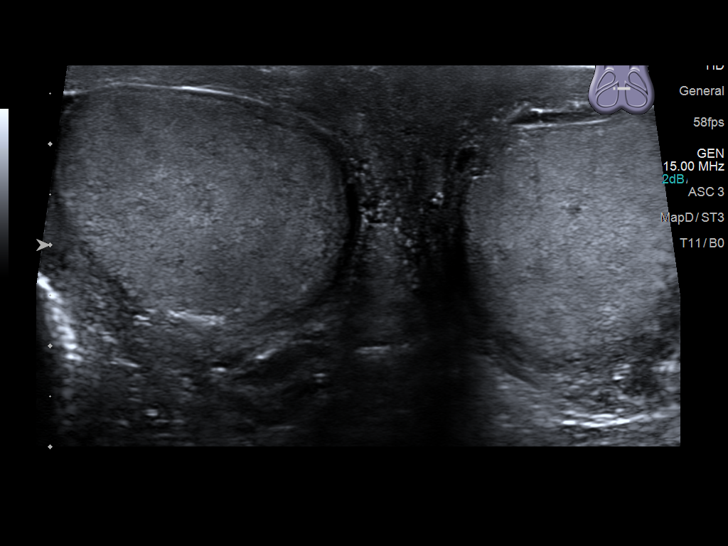
[im 5/55]
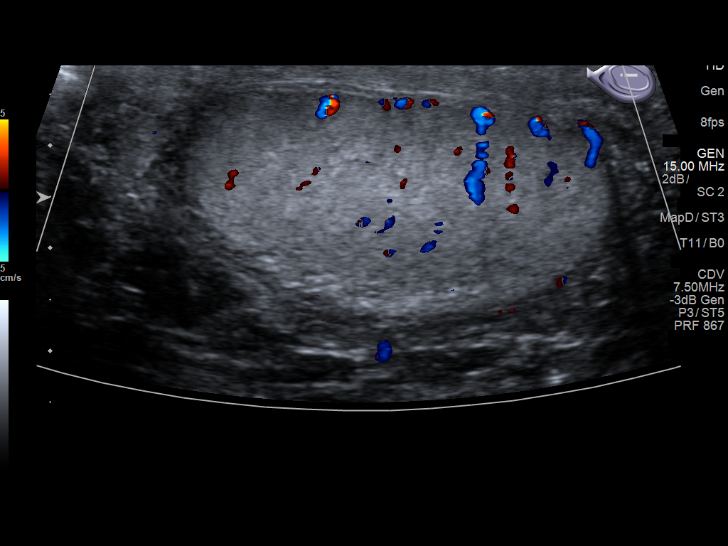
[im 10/55]
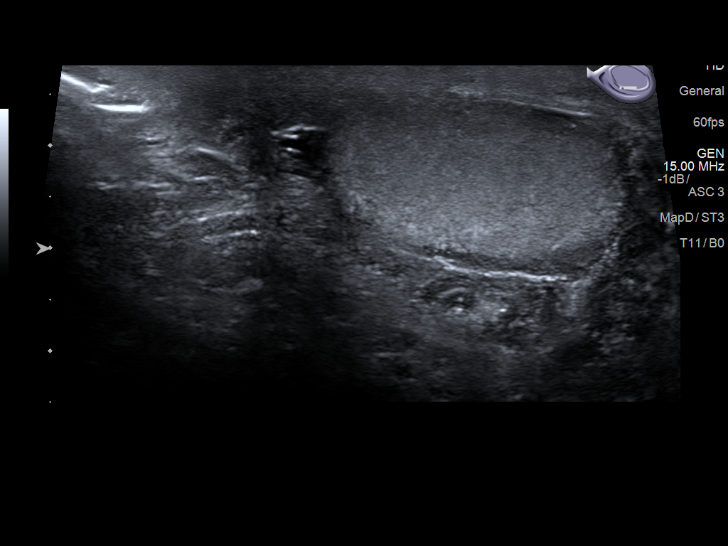
[im 14/55]
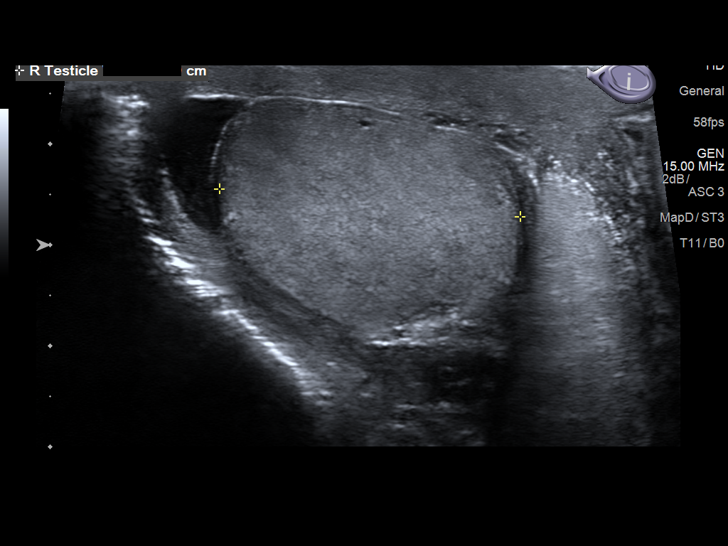
[im 19/55]
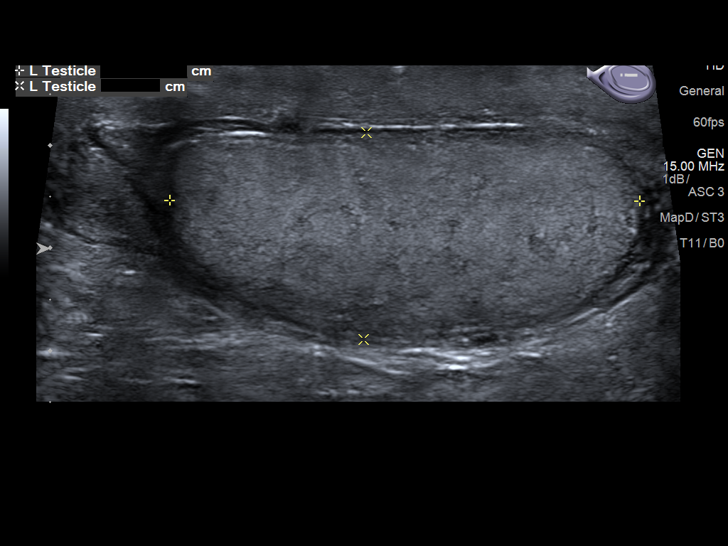
[im 23/55]
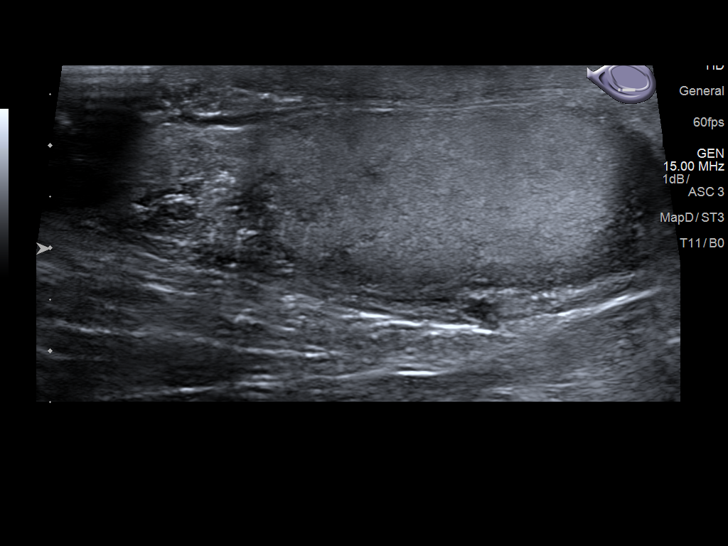
[im 28/55]
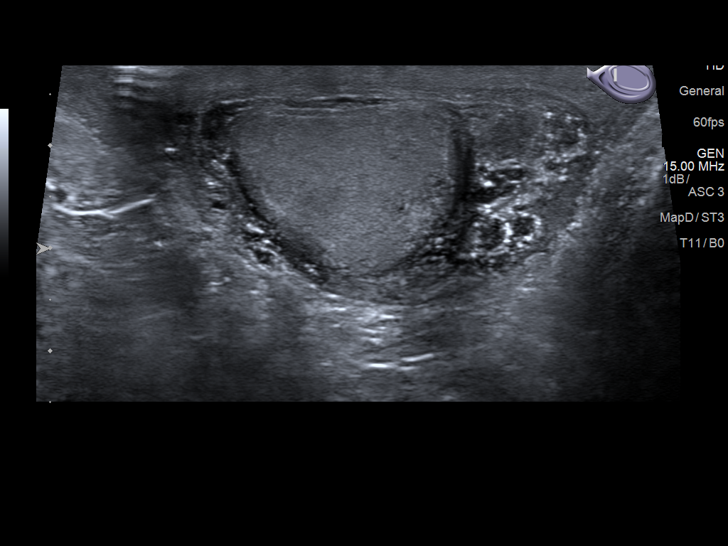
[im 32/55]
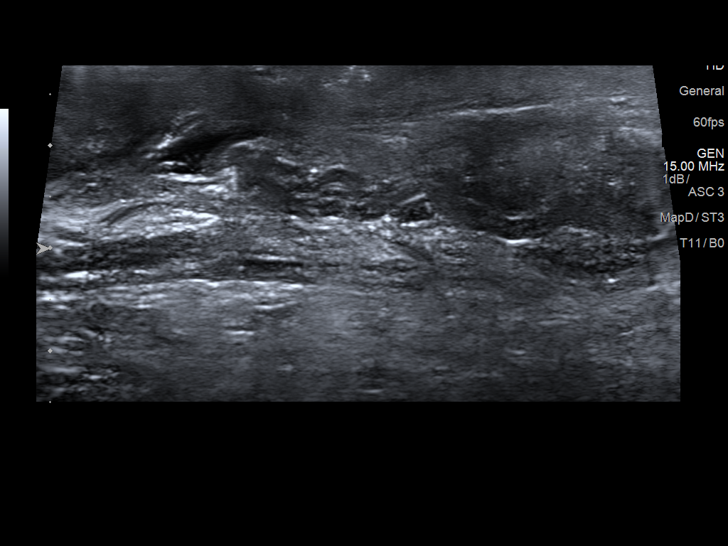
[im 37/55]
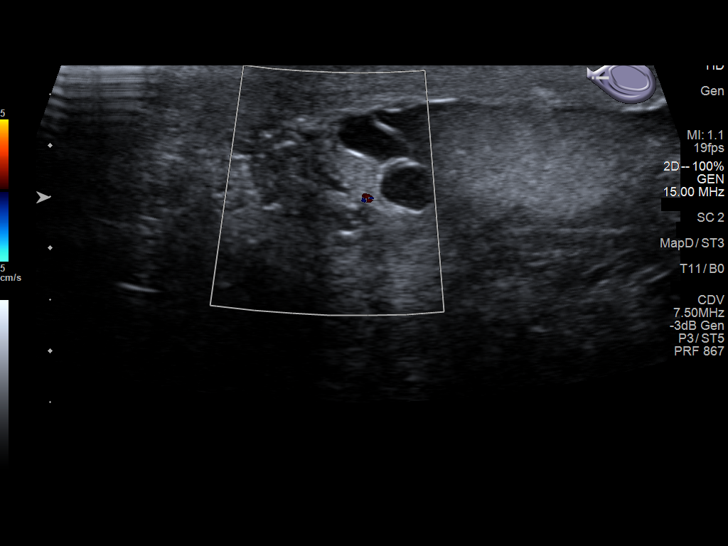
[im 41/55]
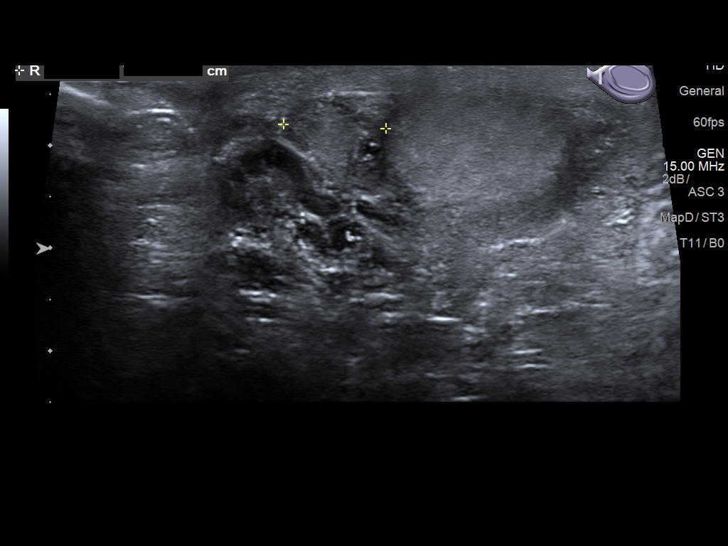
[im 46/55]
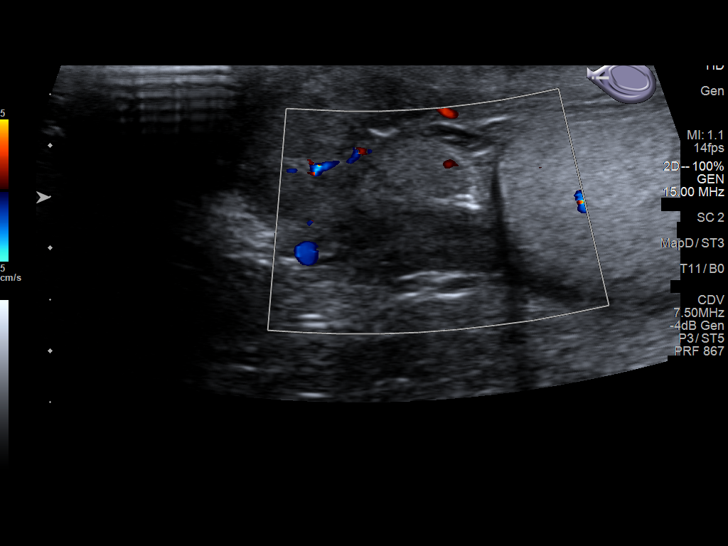
[im 50/55]
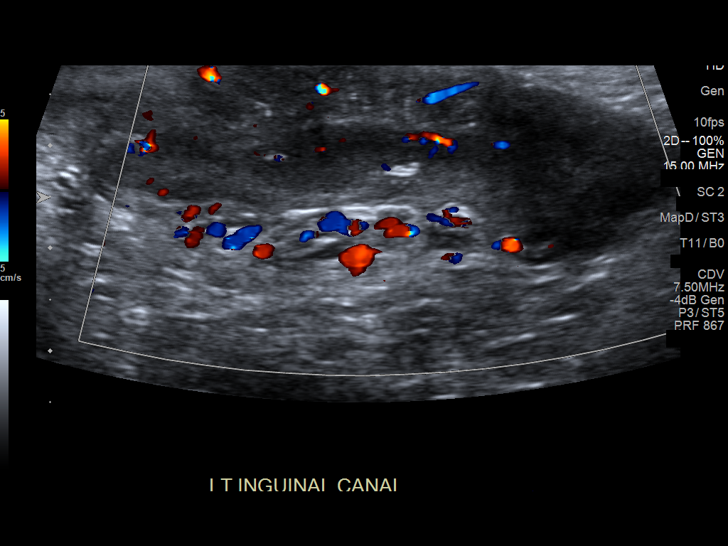
[im 55/55]
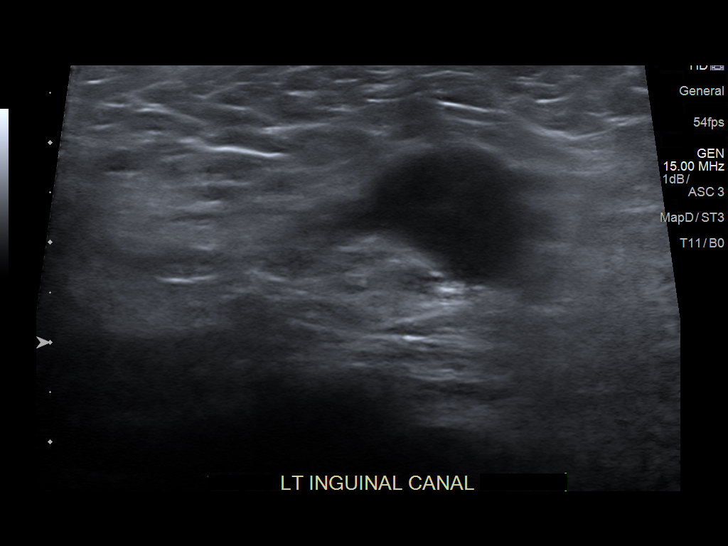

[13 of 25 positions shown; findings below may reference images not displayed]

FINDINGS: Right testicle

Measurements: 4.4 x 2.1 by 3.0 cm. No mass or microlithiasis
visualized.

Left testicle

Measurements: 4.6 x 2.0 by 2.4 cm. No mass or microlithiasis
visualized.

Right epididymis:  Normal in size and appearance.

Left epididymis:  Normal in size and appearance.

Hydrocele:  None visualized.

Varicocele:  None visualized.

Pulsed Doppler interrogation of both testes demonstrates normal low
resistance arterial and venous waveforms bilaterally.

In the left inguinal canal, in correlation with the area of palpable
concern, there is an elongated complex hypoechoic structure. This
may represent abnormally thickened spermatic cord, inguinal hernia
or lymphadenopathy. Further evaluation with CT of the abdomen may be
considered.
IMPRESSION: Normal appearance of the testicles.

The area of palpable concern in the left inguinal canal corresponds
to an abnormal elongated complex hypoechoic structure, for which
further evaluation with CT of the abdomen may be considered.

## 2019-04-14 NOTE — Progress Notes (Signed)
Complaint:  Visit Type: Patient returns to my office for continued preventative foot care services. Complaint: Patient states" my nails have grown long and thick and become painful to walk and wear shoes" Patient says he has two painful calluses on his right foot.  One callus on the inside right heel and one at tip right big toe. The patient presents for preventative foot care services. No changes to ROS.  Patient has not been seen in over 8 months.  Podiatric Exam: Vascular: dorsalis pedis and posterior tibial pulses are palpable bilateral. Capillary return is immediate. Temperature gradient is WNL. Skin turgor WNL  Sensorium: Normal Semmes Weinstein monofilament test. Normal tactile sensation bilaterally. Nail Exam: Pt has thick disfigured discolored nails with subungual debris noted bilateral entire nail hallux through fifth toenails Ulcer Exam: Pre-ulcerous callus at distal aspect medial border right hallux. Orthopedic Exam: Muscle tone and strength are WNL. No limitations in general ROM. No crepitus or effusions noted. Foot type and digits show no abnormalities. Bony prominences are unremarkable. Skin: No Porokeratosis. No infection or ulcers.     Diagnosis:  Onychomycosis, , Pain in right toe, pain in left toes    Treatment & Plan Procedures and Treatment: Consent by patient was obtained for treatment procedures. The patient understood the discussion of treatment and procedures well. All questions were answered thoroughly reviewed. Debridement of mycotic and hypertrophic toenails, 1 through 5 bilateral and clearing of subungual debris. No ulceration, no infection noted. Return Visit-Office Procedure: Patient instructed to return to the office for a follow up visit 3 months for continued evaluation and treatment.    Gardiner Barefoot DPM

## 2019-04-18 ENCOUNTER — Ambulatory Visit: Payer: Medicare Other | Admitting: Family

## 2019-04-21 ENCOUNTER — Ambulatory Visit (INDEPENDENT_AMBULATORY_CARE_PROVIDER_SITE_OTHER): Payer: Medicare Other

## 2019-04-21 ENCOUNTER — Other Ambulatory Visit: Payer: Self-pay

## 2019-04-21 DIAGNOSIS — Z0000 Encounter for general adult medical examination without abnormal findings: Secondary | ICD-10-CM | POA: Diagnosis not present

## 2019-04-21 NOTE — Progress Notes (Addendum)
Subjective:   FIELDS OROS is a 85 y.o. male who presents for Medicare Annual/Subsequent preventive examination.  Review of Systems:  No ROS.  Medicare Wellness Virtual Visit.  Visual/audio telehealth visit, UTA vital signs.   See social history for additional risk factors.   Cardiac Risk Factors include: advanced age (>69men, >25 women);hypertension;male gender     Objective:    Vitals: There were no vitals taken for this visit.  There is no height or weight on file to calculate BMI.  Advanced Directives 04/21/2019 11/30/2017 07/17/2017 07/17/2017 06/17/2017 06/09/2017 06/02/2017  Does Patient Have a Medical Advance Directive? Yes Yes Yes Yes No No No  Type of Paramedic of [place];Living will [place];Living will [place]  Does patient want to make changes to medical advance directive? No - Patient declined No - Patient declined No - Patient declined - No - Patient declined - No - Patient declined  Copy of [place] in Chart? Yes - validated most recent copy scanned in chart (See row information) Yes Yes - - - -    Tobacco Social History   Tobacco Use  Smoking Status Never Smoker  Smokeless Tobacco Never Used     Counseling given: Not Answered   Clinical Intake:  Pre-visit preparation completed: Yes        Diabetes: No  How often do you need to have someone help you when you read instructions, pamphlets, or other written materials from your doctor or pharmacy?: 1 - Never  Interpreter Needed?: No     Past Medical History:  Diagnosis Date  . Bone spur 1988   left heel  . Bursitis 1987   right shoulder  . Cellulitis 1986   right leg  . Pneumonia    noted 07/17/17 CXR   . Shingles 1959  . Urinary retention   . UTI (urinary tract infection)    sepsis E coli UTI +urine and blood cultures 07/17/17    Past Surgical History:  Procedure Laterality Date  .  APPENDECTOMY  1942  . CHOLECYSTECTOMY  1966  . EYE SURGERY  2015   catarcts extracted  . JOINT REPLACEMENT     right hip 20 + years from 2019, left hip replaced 05/01/17   . MELANOMA EXCISION  04.2010   mole removal right arm  . TONSILLECTOMY AND ADENOIDECTOMY  1944  . TOTAL HIP ARTHROPLASTY  1999   right hip   Family History  Problem Relation Age of Onset  . Clotting disorder Mother   . Heart disease Father 41  . Cancer Sister        breast  . Cerebral aneurysm Brother        hemmorage  . Cancer Daughter        breast   Social History   Socioeconomic History  . Marital status: Married    Spouse name: Jeannett Senior  . Number of children: 3  . Years of education: 63  . Highest education level: Some college, no degree  Occupational History  . Not on file  Social Needs  . Financial resource strain: Not hard at all  . Food insecurity    Worry: Never true    Inability: Never true  . Transportation needs    Medical: No    Non-medical: No  Tobacco Use  . Smoking status: Never Smoker  . Smokeless tobacco: Never Used  Substance and Sexual Activity  . Alcohol use:  No    Alcohol/week: 0.0 standard drinks  . Drug use: No  . Sexual activity: Not Currently  Lifestyle  . Physical activity    Days per week: 0 days    Minutes per session: Not on file  . Stress: Not at all  Relationships  . Social Musician on phone: Not on file    Gets together: Not on file    Attends religious service: Not on file    Active member of club or organization: Not on file    Attends meetings of clubs or organizations: Not on file    Relationship status: Married  Other Topics Concern  . Not on file  Social History Narrative   Lives in [place] with wife.   Diet - regular   Exercise - none    Outpatient Encounter Medications as of 04/21/2019  Medication Sig  . aspirin 81 MG chewable tablet Chew 81 mg by mouth daily.  . Ergocalciferol (VITAMIN D2) 400 units TABS Take 1 tablet by  mouth daily.  . finasteride (PROSCAR) 5 MG tablet Take 1 tablet (5 mg total) by mouth daily.  . Multiple Vitamins-Minerals (CENTRUM SILVER ULTRA MENS) TABS Take 1 each by mouth daily.  . tamsulosin (FLOMAX) 0.4 MG CAPS capsule Take 1 capsule (0.4 mg total) by mouth at bedtime.  . torsemide (DEMADEX) 5 MG tablet torsemide 5 mg tablet  . [DISCONTINUED] potassium chloride (MICRO-K) 10 MEQ CR capsule potassium chloride ER 10 mEq capsule,extended release   No facility-administered encounter medications on file as of 04/21/2019.     Activities of Daily Living In your present state of health, do you have any difficulty performing the following activities: 04/21/2019  Hearing? N  Vision? N  Difficulty concentrating or making decisions? Y  Comment Notes difficulty remembering  Walking or climbing stairs? N  Comment Unsteady gait, cane in use.  Dressing or bathing? N  Doing errands, shopping? Y  Comment He does not Engineer, manufacturing and eating ? Y  Comment Wife manages the cooking. Self feeds.  Using the Toilet? N  In the past six months, have you accidently leaked urine? N  Comment Followed by Urology, Dr. Apolinar Junes. Straight cath in use every other day.  Do you have problems with loss of bowel control? N  Managing your Medications? Y  Comment Wife manages  Managing your Finances? Y  Comment Wife manages  Housekeeping or managing your Housekeeping? Y  Some recent data might be hidden    Patient Care Team: Allegra Grana, FNP as PCP - General (Family Medicine)   Assessment:   This is a routine wellness examination for [place].  I connected with patient 04/21/19 at 11:00 AM EDT by a video/audio enabled telemedicine application and verified that I am speaking with the correct person using two identifiers. Patient stated full name and DOB. Patient gave permission to continue with virtual visit. Patient's location was at home and Nurse's location was at [place] office.   Health  Maintenance Due: -Influenza vaccine 2020- discussed; to be completed in season with doctor or local pharmacy.   Update all pending maintenance due as appropriate.   See completed HM at the end of note.   Eye: Visual acuity not assessed. Virtual visit. Wears corrective lenses. Followed by their ophthalmologist.  Dental: UTD  Hearing: Demonstrates normal hearing during visit.  Safety:  Patient feels safe at home- yes Patient does have smoke detectors at home- yes Patient does wear sunscreen or protective  clothing when in direct sunlight - yes Patient does wear seat belt when in a moving vehicle - yes Patient drives- no Adequate lighting in walkways free from debris- yes Grab bars and handrails used as appropriate- yes Ambulates with an assistive device- cane Cell phone on person when ambulating outside of the home- yes  Social: Alcohol intake - no    Smoking history- never   Smokers in home? none Illicit drug use? none  Depression: PHQ 2 &9 complete. See screening below. Denies irritability, anhedonia, sadness/tearfullness.  Stable.   Falls: See screening below.    Medication: Taking as directed and without issues.   Covid-19: Precautions and sickness symptoms discussed. Wears mask, social distancing, hand hygiene as appropriate.   Activities of Daily Living Patient denies needing assistance with: household chores, feeding themselves, getting from bed to chair, getting to the toilet, bathing/showering, dressing, managing money, or preparing meals.   Memory: Patient is alert. Patient denies difficulty focusing or concentrating. Correctly identified the president of the [place], season and recall. Patient likes to read for brain stimulation.  BMI- discussed the importance of a healthy diet, water intake and the benefits of aerobic exercise.  Educational material provided.  Physical activity- walking, no routine.  Diet: Healthy  Water: good intake Caffeine: no  Other  Providers Patient Care Team: Allegra Grana, FNP as PCP - General (Family Medicine)  Exercise Activities and Dietary recommendations    Goals      Patient Stated   . Weight (lb) < 278 lb (126.1 kg) (pt-stated)      Other   . Increase physical activity     Walk for exercise       Fall Risk Fall Risk  04/21/2019 11/30/2017 11/27/2016 05/08/2016 11/28/2015  Falls in the past year? 0 No No No No   Timed Get Up and Go Performed: no, virtual visit  Depression Screen PHQ 2/9 Scores 04/21/2019 11/30/2017 11/27/2016 11/28/2015  PHQ - 2 Score 0 0 1 0  PHQ- 9 Score - - 2 -    Cognitive Function MMSE - Mini Mental State Exam 11/28/2015  Orientation to time 5  Orientation to Place 5  Registration 3  Attention/ Calculation 5  Recall 3  Language- name 2 objects 2  Language- repeat 1  Language- follow 3 step command 3  Language- read & follow direction 1  Write a sentence 1  Copy design 1  Total score 30     6CIT Screen 04/21/2019 11/30/2017 11/27/2016  What Year? 0 points 0 points 0 points  What month? 0 points 0 points 0 points  What time? 0 points 0 points 0 points  Count back from 20 0 points 0 points 0 points  Months in reverse 0 points 0 points 0 points  Repeat phrase 0 points 0 points 0 points  Total Score 0 0 0    Immunization History  Administered Date(s) Administered  . Hepatitis B 07/06/1992  . Influenza Nasal 03/29/2011  . Influenza Split 05/02/2012, 05/11/2014  . Influenza Whole 05/28/2008  . Influenza, High Dose Seasonal PF 04/15/2017, 05/31/2018  . Influenza-Unspecified 05/02/2015, 05/06/2016  . Pneumococcal Conjugate-13 03/24/2014  . Pneumococcal Polysaccharide-23 05/28/2000  . Td 05/28/2008  . Tdap 12/27/2010  . Zoster 05/29/2007   Screening Tests Health Maintenance  Topic Date Due  . INFLUENZA VACCINE  10/26/2019 (Originally 02/26/2019)  . TETANUS/TDAP  12/26/2020  . PNA vac Low Risk Adult  Completed      Plan:   Keep all routine maintenance  appointments.   Cpe 07/13/19 @ 830  Medicare Attestation I have personally reviewed: The patient's medical and social history Their use of alcohol, tobacco or illicit drugs Their current medications and supplements The patient's functional ability including ADLs,fall risks, home safety risks, cognitive, and hearing and visual impairment Diet and physical activities Evidence for depression   In addition, I have reviewed and discussed with patient certain preventive protocols, quality metrics, and best practice recommendations. A written personalized care plan for preventive services as well as general preventive health recommendations were provided to patient via mail.     Ashok PallOBrien-Blaney, Karma Hiney L, LPN  6/04/54099/24/2020   Reviewed above information.  Agree with assessment and plan.    Dr Lorin PicketScott

## 2019-04-21 NOTE — Patient Instructions (Addendum)
  Mr. Corpus , Thank you for taking time to come for your Medicare Wellness Visit. I appreciate your ongoing commitment to your health goals. Please review the following plan we discussed and let me know if I can assist you in the future.   These are the goals we discussed: Goals      Patient Stated   . Weight (lb) < 278 lb (126.1 kg) (pt-stated)      Other   . Increase physical activity     Walk for exercise       This is a list of the screening recommended for you and due dates:  Health Maintenance  Topic Date Due  . Flu Shot  10/26/2019*  . Tetanus Vaccine  12/26/2020  . Pneumonia vaccines  Completed  *Topic was postponed. The date shown is not the original due date.

## 2019-06-22 ENCOUNTER — Ambulatory Visit (INDEPENDENT_AMBULATORY_CARE_PROVIDER_SITE_OTHER): Payer: Medicare Other | Admitting: Urology

## 2019-06-22 ENCOUNTER — Encounter: Payer: Self-pay | Admitting: Urology

## 2019-06-22 ENCOUNTER — Other Ambulatory Visit: Payer: Self-pay

## 2019-06-22 VITALS — BP 152/70 | HR 79 | Ht 74.0 in | Wt 270.0 lb

## 2019-06-22 DIAGNOSIS — R339 Retention of urine, unspecified: Secondary | ICD-10-CM | POA: Diagnosis not present

## 2019-06-22 DIAGNOSIS — N138 Other obstructive and reflux uropathy: Secondary | ICD-10-CM | POA: Diagnosis not present

## 2019-06-22 DIAGNOSIS — N401 Enlarged prostate with lower urinary tract symptoms: Secondary | ICD-10-CM

## 2019-06-22 LAB — URINALYSIS, COMPLETE
Bilirubin, UA: NEGATIVE
Glucose, UA: NEGATIVE
Ketones, UA: NEGATIVE
Leukocytes,UA: NEGATIVE
Nitrite, UA: NEGATIVE
Protein,UA: NEGATIVE
RBC, UA: NEGATIVE
Specific Gravity, UA: 1.015 (ref 1.005–1.030)
Urobilinogen, Ur: 0.2 mg/dL (ref 0.2–1.0)
pH, UA: 6.5 (ref 5.0–7.5)

## 2019-06-22 LAB — MICROSCOPIC EXAMINATION
Bacteria, UA: NONE SEEN
Epithelial Cells (non renal): NONE SEEN /hpf (ref 0–10)
RBC: NONE SEEN /hpf (ref 0–2)

## 2019-06-22 LAB — BLADDER SCAN AMB NON-IMAGING

## 2019-06-22 MED ORDER — TAMSULOSIN HCL 0.4 MG PO CAPS: 0.4000 mg | ORAL_CAPSULE | Freq: Every day | ORAL | 3 refills | Status: DC

## 2019-06-22 MED ORDER — FINASTERIDE 5 MG PO TABS: 5.0000 mg | ORAL_TABLET | Freq: Every day | ORAL | 3 refills | Status: DC

## 2019-06-22 NOTE — Progress Notes (Signed)
06/22/2019 11:38 AM   Terry Vaughn Apr 30, 1933 [id number]  Referring provider: Allegra Grana, FNP [address] [place],  [place] [address]  Chief Complaint  Patient presents with  . Urinary Retention    HPI: 85 year old male with a extensive GU history who returns today for routine annual follow-up.  He has a personal history of urinary retention, sepsis of urinary source and severe epididymoorchitis.  Over the past year or so, his urinary symptoms have been relatively stable.  He is currently on Flomax and finasteride.  Due to his history of urinary retention, he was self cathing fairly frequently and more recently less often.  He rings in a list with him today.  He is cut back to cathing every other day or symptoms even every third day.  He generally is able to urinate anywhere from a volume of 50 cc up to 300 cc at a time.  His PVRs by cath are no greater than 75 cc but generally significantly lower.  No recent urinary tract infections.  He was treated for for an isolated infection in February/2020 which he relates to using none prelubricated catheter.  Since switching the catheter type, he is not had any issues with no other infections.  His wife caths him.  Overall, he is relatively pleased with his urinary symptoms.  UA today is negative.    IPSS    Row Name 06/22/19 1100         International Prostate Symptom Score   How often have you had the sensation of not emptying your bladder?  Almost always     How often have you had to urinate less than every two hours?  More than half the time     How often have you found you stopped and started again several times when you urinated?  More than half the time     How often have you found it difficult to postpone urination?  Not at All     How often have you had a weak urinary stream?  Less than 1 in 5 times     How often have you had to strain to start urination?  Less than 1 in 5 times     How many times  did you typically get up at night to urinate?  4 Times     Total IPSS Score  19       Quality of Life due to urinary symptoms   If you were to spend the rest of your life with your urinary condition just the way it is now how would you feel about that?  Mostly Satisfied        Score:  1-7 Mild 8-19 Moderate 20-35 Severe   PMH: Past Medical History:  Diagnosis Date  . Bone spur 1988   left heel  . Bursitis 1987   right shoulder  . Cellulitis 1986   right leg  . Pneumonia    noted 07/17/17 CXR   . Shingles 1959  . Urinary retention   . UTI (urinary tract infection)    sepsis E coli UTI +urine and blood cultures 07/17/17     Surgical History: Past Surgical History:  Procedure Laterality Date  . APPENDECTOMY  1942  . CHOLECYSTECTOMY  1966  . EYE SURGERY  2015   catarcts extracted  . JOINT REPLACEMENT     right hip 20 + years from 2019, left hip replaced 05/01/17   . MELANOMA EXCISION  04.2010  mole removal right arm  . TONSILLECTOMY AND ADENOIDECTOMY  1944  . TOTAL HIP ARTHROPLASTY  1999   right hip    Home Medications:  Allergies as of 06/22/2019      Reactions   Codeine Other (See Comments)   Alters mental state      Medication List       Accurate as of June 22, 2019 11:38 AM. If you have any questions, ask your nurse or doctor.        aspirin 81 MG chewable tablet Chew 81 mg by mouth daily.   Centrum Silver Ultra Mens Tabs Take 1 each by mouth daily.   finasteride 5 MG tablet Commonly known as: PROSCAR Take 1 tablet (5 mg total) by mouth daily.   tamsulosin 0.4 MG Caps capsule Commonly known as: FLOMAX Take 1 capsule (0.4 mg total) by mouth at bedtime.   torsemide 5 MG tablet Commonly known as: DEMADEX torsemide 5 mg tablet   Vitamin D2 10 MCG (400 UNIT) Tabs Take 1 tablet by mouth daily.       Allergies:  Allergies  Allergen Reactions  . Codeine Other (See Comments)    Alters mental state    Family History: Family  History  Problem Relation Age of Onset  . Clotting disorder Mother   . Heart disease Father 18  . Cancer Sister        breast  . Cerebral aneurysm Brother        [place]  . Cancer Daughter        breast    Social History:  reports that he has never smoked. He has never used smokeless tobacco. He reports that he does not drink alcohol or use drugs.  ROS: UROLOGY Frequent Urination?: Yes Hard to postpone urination?: No Burning/pain with urination?: No Get up at night to urinate?: Yes Leakage of urine?: No Urine stream starts and stops?: No Trouble starting stream?: No Do you have to strain to urinate?: No Blood in urine?: No Urinary tract infection?: No Sexually transmitted disease?: No Injury to kidneys or bladder?: No Painful intercourse?: No Weak stream?: No Erection problems?: No Penile pain?: No  Gastrointestinal Nausea?: No Vomiting?: No Indigestion/heartburn?: No Diarrhea?: No Constipation?: No  Constitutional Fever: No Night sweats?: No Weight loss?: No Fatigue?: No  Skin Skin rash/lesions?: No Itching?: No  Eyes Blurred vision?: No Double vision?: No  Ears/Nose/Throat Sore throat?: No Sinus problems?: No  Hematologic/Lymphatic Swollen glands?: No Easy bruising?: No  Cardiovascular Leg swelling?: No Chest pain?: No  Respiratory Cough?: No Shortness of breath?: No  Endocrine Excessive thirst?: No  Musculoskeletal Back pain?: No Joint pain?: No  Neurological Headaches?: No Dizziness?: No  Psychologic Depression?: No Anxiety?: No  Physical Exam: BP (!) 152/70   Pulse 79   Ht 6\' 2"  (1.88 m)   Wt 270 lb (122.5 kg)   BMI 34.67 kg/m   Constitutional:  Alert and oriented, No acute distress. HEENT: [place] AT, moist mucus membranes.  Trachea midline, no masses. Cardiovascular: No clubbing, cyanosis, or edema. Respiratory: Normal respiratory effort, no increased work of breathing. GI: Abdomen is soft, nontender, nondistended, no  abdominal masses GU: No CVA tenderness Lymph: No cervical or inguinal lymphadenopathy. Skin: No rashes, bruises or suspicious lesions. Neurologic: Grossly intact, no focal deficits, moving all 4 extremities. Psychiatric: Normal mood and affect.  Laboratory Data: Lab Results  Component Value Date   WBC 10.1 07/19/2017   HGB 11.6 (L) 07/19/2017   HCT 34.0 (L) 07/19/2017  MCV 92.1 07/19/2017   PLT 249 07/19/2017    Lab Results  Component Value Date   CREATININE 1.12 04/16/2018    Lab Results  Component Value Date   HGBA1C 5.1 04/15/2017    Urinalysis/ Pertinent Imaging:  Results for orders placed or performed in visit on 06/22/19  Microscopic Examination   URINE  Result Value Ref Range   WBC, UA 0-5 0 - 5 /hpf   RBC None seen 0 - 2 /hpf   Epithelial Cells (non renal) None seen 0 - 10 /hpf   Bacteria, UA None seen None seen/Few  Urinalysis, Complete  Result Value Ref Range   Specific Gravity, UA 1.015 1.005 - 1.030   pH, UA 6.5 5.0 - 7.5   Color, UA Yellow Yellow   Appearance Ur Clear Clear   Leukocytes,UA Negative Negative   Protein,UA Negative Negative/Trace   Glucose, UA Negative Negative   Ketones, UA Negative Negative   RBC, UA Negative Negative   Bilirubin, UA Negative Negative   Urobilinogen, Ur 0.2 0.2 - 1.0 mg/dL   Nitrite, UA Negative Negative   Microscopic Examination See below:   Bladder Scan (Post Void Residual) in office  Result Value Ref Range   Scan Result 45ML     Assessment & Plan:    1. BPH with urinary obstruction Symptoms stable, satisfied on Flomax and finasteride  Infrequent infections which is reassuring, return as needed for worsening symptoms  Continue to follow annually  Medications refilled  - Bladder Scan (Post Void Residual) in office - tamsulosin (FLOMAX) 0.4 MG CAPS capsule; Take 1 capsule (0.4 mg total) by mouth at bedtime.  Dispense: 90 capsule; Refill: 3 - finasteride (PROSCAR) 5 MG tablet; Take 1 tablet (5 mg total)  by mouth daily.  Dispense: 90 tablet; Refill: 3  2. Incomplete bladder emptying Personal history of incomplete bladder emptying now markedly improved  Based on review of voiding journal and self cath, he no longer needs to catheterize himself.  Adequate bladder emptying this point time.  Resume as needed. - Urinalysis, Complete   Return in about 1 year (around 06/21/2020) for IPSS/ PVR/ UA.  Vanna ScotlandAshley Hobert Poplaski, MD  [facility] [address], [address] [place], [place] [address] [phone]

## 2019-07-13 ENCOUNTER — Encounter: Payer: Medicare Other | Admitting: Family

## 2019-07-14 ENCOUNTER — Other Ambulatory Visit: Payer: Self-pay

## 2019-07-14 ENCOUNTER — Ambulatory Visit (INDEPENDENT_AMBULATORY_CARE_PROVIDER_SITE_OTHER): Payer: Medicare Other | Admitting: Podiatry

## 2019-07-14 ENCOUNTER — Encounter: Payer: Self-pay | Admitting: Podiatry

## 2019-07-14 DIAGNOSIS — M79676 Pain in unspecified toe(s): Secondary | ICD-10-CM | POA: Diagnosis not present

## 2019-07-14 DIAGNOSIS — B351 Tinea unguium: Secondary | ICD-10-CM | POA: Diagnosis not present

## 2019-07-14 DIAGNOSIS — M2030 Hallux varus (acquired), unspecified foot: Secondary | ICD-10-CM

## 2019-07-14 NOTE — Progress Notes (Signed)
Complaint:  Visit Type: Patient returns to my office for continued preventative foot care services. Complaint: Patient states" my nails have grown long and thick and become painful to walk and wear shoes"  The patient presents for preventative foot care services. No changes to ROS  Podiatric Exam: Vascular: dorsalis pedis and posterior tibial pulses are palpable bilateral. Capillary return is immediate. Temperature gradient is WNL. Skin turgor WNL  Sensorium: Normal Semmes Weinstein monofilament test. Normal tactile sensation bilaterally. Nail Exam: Pt has thick disfigured discolored nails with subungual debris noted bilateral entire nail hallux through fifth toenails Ulcer Exam: Pre-ulcerous callus at distal aspect medial border right hallux. Orthopedic Exam: Muscle tone and strength are WNL. No limitations in general ROM. No crepitus or effusions noted. Foot type and digits show no abnormalities. Bony prominences are unremarkable. Skin: No Porokeratosis. No infection or ulcers.  Callus noted hallux malleus right hallux.   Diagnosis:  Onychomycosis, , Pain in right toe, pain in left toes    Treatment & Plan Procedures and Treatment: Consent by patient was obtained for treatment procedures. The patient understood the discussion of treatment and procedures well. All questions were answered thoroughly reviewed. Debridement of mycotic and hypertrophic toenails, 1 through 5 bilateral and clearing of subungual debris. No ulceration, no infection noted. Return Visit-Office Procedure: Patient instructed to return to the office for a follow up visit 3 months for continued evaluation and treatment.    Gardiner Barefoot DPM

## 2019-09-01 ENCOUNTER — Other Ambulatory Visit: Payer: Self-pay | Admitting: Urology

## 2019-09-01 DIAGNOSIS — N401 Enlarged prostate with lower urinary tract symptoms: Secondary | ICD-10-CM

## 2019-09-01 DIAGNOSIS — N138 Other obstructive and reflux uropathy: Secondary | ICD-10-CM

## 2019-10-06 ENCOUNTER — Ambulatory Visit: Payer: Medicare Other | Admitting: Podiatry

## 2019-11-07 ENCOUNTER — Ambulatory Visit (INDEPENDENT_AMBULATORY_CARE_PROVIDER_SITE_OTHER): Payer: Medicare Other | Admitting: Podiatry

## 2019-11-07 ENCOUNTER — Other Ambulatory Visit: Payer: Self-pay

## 2019-11-07 ENCOUNTER — Ambulatory Visit (INDEPENDENT_AMBULATORY_CARE_PROVIDER_SITE_OTHER): Payer: Medicare Other

## 2019-11-07 ENCOUNTER — Encounter: Payer: Self-pay | Admitting: Podiatry

## 2019-11-07 VITALS — Temp 98.1°F

## 2019-11-07 DIAGNOSIS — L97412 Non-pressure chronic ulcer of right heel and midfoot with fat layer exposed: Secondary | ICD-10-CM | POA: Diagnosis not present

## 2019-11-07 NOTE — Progress Notes (Signed)
Complaint:  Visit Type: Patient returns to my office for an evaluation of his right heel.  He says that he has an open wound on the bottom of his right heel where the skin has broken down and is draining fluid.  He says he is unaware of how long this open wound has been present.  He does say he went to the urgent care 2 weeks ago and they placed a bandage on his heel and told him to follow-up with podiatry.  They assured him there was no evidence of infection and no x-ray was taken at the time.  He presents the office today stating he is having severe pain in his right heel upon weightbearing.  He says he has developed significant drainage coming from the right heel after walking.  He states he has  pain at the site of the open wound as well as around the sides of the right heel bone.  Patient does state he feels his  right foot extending to his ankle is swollen.  Patient denies any chills or fever.  He presents the office today for an evaluation of this open wound right heel. He also has a n open wound on left big toe and pre-ulcerous wound digit right foot.  Podiatric Exam: Vascular: dorsalis pedis and posterior tibial pulses are palpable bilateral. Capillary return is immediate. Temperature gradient is WNL. Skin turgor WNL  Sensorium: Normal Semmes Weinstein monofilament test. Normal tactile sensation bilaterally. Nail Exam: Pt has thick disfigured discolored nails with subungual debris noted bilateral entire nail hallux through fifth toenails Ulcer Exam: Open wound left hallux.  No infection noted.   Orthopedic Exam: Muscle tone and strength are WNL. No limitations in general ROM. No crepitus or effusions noted. Foot type and digits show no abnormalities. Bony prominences are unremarkable. Skin: There is a 15 mm x 15 mm open wound on the plantar aspect of the right heel.  No evidence of drainage or infection noted.  Healthy granulation tissue noted in the center of this ulcer.  There is hyperkeratotic  tissue buildup around the ulcer on the right heel.  Diagnosis:  Ulcer right foot  Treatment & Plan Procedures and Treatment:  ROV.  Xray taken reveal no evidence of FB right heel or bony reactivity right heel.  The open wound was bandaged with antibiotic cream a bandage to soak up additional bleeding and cushion so the pain in his right heel is reduced.  This whole bandage was then covered with Coban and a clear plastic bag to help to limit bleeding into his foot wear.  .  Patient was dispensed a surgical shoe to wear . He was instructed to return to the office tomorrow with Dr.  Allena Katz.    Return Visit-Office Procedure: Patient instructed to return to the office tomorrow for a visit with Dr.  Allena Katz.    Helane Gunther DPM

## 2019-11-08 ENCOUNTER — Ambulatory Visit (INDEPENDENT_AMBULATORY_CARE_PROVIDER_SITE_OTHER): Payer: Medicare Other | Admitting: Podiatry

## 2019-11-08 ENCOUNTER — Other Ambulatory Visit: Payer: Self-pay

## 2019-11-08 DIAGNOSIS — L97412 Non-pressure chronic ulcer of right heel and midfoot with fat layer exposed: Secondary | ICD-10-CM

## 2019-11-09 ENCOUNTER — Encounter: Payer: Self-pay | Admitting: Podiatry

## 2019-11-09 NOTE — Progress Notes (Signed)
Subjective:  Patient ID: Terry Vaughn, male    DOB: 11-Mar-1933,  MRN: [id number]  Chief Complaint  Patient presents with  . Wound Check    pt is here for a wound on the bottom of the left heel, pt states that he was seen yesterday by Dr. Stacie Acres, for wound care, pt states that it is painful to the touch. pt also states that he has been keeping it well bandaged as well.    86 y.o. male presents for wound care.  Patient presents with right heel ulceration that has been present for quite some time.  Patient was seen by Dr. Stacie Acres who has been treating for past 1 week.  Patient states it may have been present for about a month.  It is painful to walk on it been keeping it well bandaged.  Patient is currently wearing surgical shoe.  He would like to know what is the best when the fastest way to treat this and heal this as he is worried that this may lead to worsening wound and or infection to the heel bone.  He denies any other acute complaints.  He has not been taking any antibiotics.   Review of Systems: Negative except as noted in the HPI. Denies N/V/F/Ch.  Past Medical History:  Diagnosis Date  . Bone spur 1988   left heel  . Bursitis 1987   right shoulder  . Cellulitis 1986   right leg  . Pneumonia    noted 07/17/17 CXR   . Shingles 1959  . Urinary retention   . UTI (urinary tract infection)    sepsis E coli UTI +urine and blood cultures 07/17/17     Current Outpatient Medications:  .  aspirin 81 MG chewable tablet, Chew 81 mg by mouth daily., Disp: , Rfl:  .  Ergocalciferol (VITAMIN D2) 400 units TABS, Take 1 tablet by mouth daily., Disp: , Rfl:  .  finasteride (PROSCAR) 5 MG tablet, TAKE 1 TABLET(5 MG) BY MOUTH DAILY, Disp: 90 tablet, Rfl: 2 .  Multiple Vitamins-Minerals (CENTRUM SILVER ULTRA MENS) TABS, Take 1 each by mouth daily., Disp: , Rfl:  .  omeprazole (PRILOSEC) 20 MG capsule, omeprazole 20 mg capsule,delayed release, Disp: , Rfl:  .  potassium chloride (KLOR-CON) 10 MEQ  tablet, potassium chloride ER 10 mEq tablet,extended release, Disp: , Rfl:  .  tamsulosin (FLOMAX) 0.4 MG CAPS capsule, TAKE 1 CAPSULE(0.4 MG) BY MOUTH AT BEDTIME, Disp: 90 capsule, Rfl: 2 .  torsemide (DEMADEX) 5 MG tablet, torsemide 5 mg tablet, Disp: 90 tablet, Rfl: 3  Social History   Tobacco Use  Smoking Status Never Smoker  Smokeless Tobacco Never Used    Allergies  Allergen Reactions  . Codeine Other (See Comments)    Alters mental state   Objective:  There were no vitals filed for this visit. There is no height or weight on file to calculate BMI. Constitutional Well developed. Well nourished.  Vascular Dorsalis pedis faintly palpable bilaterally. Posterior tibial faintly palpable bilaterally. Capillary refill normal to all digits.  No cyanosis or clubbing noted. Pedal hair growth normal.  Neurologic Normal speech. Oriented to person, place, and time. Protective sensation absent  Dermatologic Wound Location: Right heel ulceration with fat layer exposed Wound Base: Mixed Granular/Fibrotic Peri-wound: Calloused Exudate: None: wound tissue dry Wound Measurements: -See below  Orthopedic: No pain to palpation either foot.   Radiographs: None however the previous x-ray showed no cortical irregularity consistent with osteomyelitis.  Calcification noted in the Achilles  tendon as well as plantar and posterior heel spurring noted.  Arthritic changes noted at the midfoot.  No soft tissue emphysema noted. Assessment:  No diagnosis found. Plan:  Patient was evaluated and treated and all questions answered.  Ulcer right heel with fat layer exposed -Debridement as below. -Dressed with Betadine wet-to-dry, DSD. -Continue off-loading with surgical shoe. -After further debridement the size of the wound had increased from last time when seen by Dr. Prudence Davidson.  There was plenty of hyperkeratotic lesions circumferentially around the wound therefore leading to increase in the wound  size. -If no improvement in the wound will consider vascular work-up as well given that the pedal pulses was very faintly palpable.  Procedure: Excisional Debridement of Wound Rationale: Removal of non-viable soft tissue from the wound to promote healing.  Anesthesia: none Pre-Debridement Wound Measurements: 2.5 cm x 1.5 cm x 0.3 cm  Post-Debridement Wound Measurements: Same Type of Debridement: Sharp Excisional Tissue Removed: Non-viable soft tissue Depth of Debridement: subcutaneous tissue. Technique: Sharp excisional debridement to bleeding, viable wound base.  Dressing: Dry, sterile, compression dressing. Disposition: Patient tolerated procedure well. Patient to return in 1 week for follow-up.  No follow-ups on file.

## 2019-11-15 ENCOUNTER — Encounter: Payer: Self-pay | Admitting: Podiatry

## 2019-11-15 ENCOUNTER — Other Ambulatory Visit: Payer: Self-pay

## 2019-11-15 ENCOUNTER — Ambulatory Visit (INDEPENDENT_AMBULATORY_CARE_PROVIDER_SITE_OTHER): Payer: Medicare Other | Admitting: Podiatry

## 2019-11-15 DIAGNOSIS — L97412 Non-pressure chronic ulcer of right heel and midfoot with fat layer exposed: Secondary | ICD-10-CM

## 2019-11-15 NOTE — Progress Notes (Addendum)
Subjective:  Patient ID: Terry Vaughn, male    DOB: Dec 02, 1932,  MRN: [id number]  Chief Complaint  Patient presents with  . Foot Pain    pt is here for a skin ulcer of the right heel, pt states that he is concerned that the bottom of the right heel, is not getting better, pt is also concerned about the left heel getting wet as well.    86 y.o. male presents for wound care.  Patient states is doing well he presents with a follow-up of right heel ulceration.  Patient got his ulceration wet and therefore there is macerated skin around the heel.  Patient states his been doing Betadine wet-to-dry dressing changes every other day.  He has been ambulating with a surgical shoe on.  He denies any other acute complaints.  Review of Systems: Negative except as noted in the HPI. Denies N/V/F/Ch.  Past Medical History:  Diagnosis Date  . Bone spur 1988   left heel  . Bursitis 1987   right shoulder  . Cellulitis 1986   right leg  . Pneumonia    noted 07/17/17 CXR   . Shingles 1959  . Urinary retention   . UTI (urinary tract infection)    sepsis E coli UTI +urine and blood cultures 07/17/17     Current Outpatient Medications:  .  aspirin 81 MG chewable tablet, Chew 81 mg by mouth daily., Disp: , Rfl:  .  Ergocalciferol (VITAMIN D2) 400 units TABS, Take 1 tablet by mouth daily., Disp: , Rfl:  .  finasteride (PROSCAR) 5 MG tablet, TAKE 1 TABLET(5 MG) BY MOUTH DAILY, Disp: 90 tablet, Rfl: 2 .  Multiple Vitamins-Minerals (CENTRUM SILVER ULTRA MENS) TABS, Take 1 each by mouth daily., Disp: , Rfl:  .  omeprazole (PRILOSEC) 20 MG capsule, omeprazole 20 mg capsule,delayed release, Disp: , Rfl:  .  potassium chloride (KLOR-CON) 10 MEQ tablet, potassium chloride ER 10 mEq tablet,extended release, Disp: , Rfl:  .  tamsulosin (FLOMAX) 0.4 MG CAPS capsule, TAKE 1 CAPSULE(0.4 MG) BY MOUTH AT BEDTIME, Disp: 90 capsule, Rfl: 2 .  torsemide (DEMADEX) 5 MG tablet, torsemide 5 mg tablet, Disp: 90 tablet, Rfl:  3  Social History   Tobacco Use  Smoking Status Never Smoker  Smokeless Tobacco Never Used    Allergies  Allergen Reactions  . Codeine Other (See Comments)    Alters mental state   Objective:  There were no vitals filed for this visit. There is no height or weight on file to calculate BMI. Constitutional Well developed. Well nourished.  Vascular Dorsalis pedis faintly palpable bilaterally. Posterior tibial faintly palpable bilaterally. Capillary refill normal to all digits.  No cyanosis or clubbing noted. Pedal hair growth normal.  Neurologic Normal speech. Oriented to person, place, and time. Protective sensation absent  Dermatologic Wound Location: Right heel ulceration with fat layer exposed Wound Base: Mixed Granular/Fibrotic Peri-wound: Calloused Exudate: None: wound tissue moderate serosanguinous Wound Measurements: -See below  Orthopedic: No pain to palpation either foot.   Radiographs: None however the previous x-ray showed no cortical irregularity consistent with osteomyelitis.  Calcification noted in the Achilles tendon as well as plantar and posterior heel spurring noted.  Arthritic changes noted at the midfoot.  No soft tissue emphysema noted. Assessment:   1. Skin ulcer of right heel with fat layer exposed ([facility])    Plan:  Patient was evaluated and treated and all questions answered.  Ulcer right heel with fat layer exposed~stagnant -Debridement as below. -Dressed  with Betadine wet-to-dry, DSD. -Continue off-loading with surgical shoe. -Home care supplies were ordered for the patient. -After further debridement the size of the wound had increased from last time when seen by Dr. Prudence Davidson.  There was plenty of hyperkeratotic lesions circumferentially around the wound therefore leading to increase in the wound size. -I will plan on getting ABIs PVRs as patient has faintly palpable/diminished flow to the right lower extremity.  If there is concerning for vascular  compromise will discuss improving the vascular flow by following up with the vascular surgeon. -I will also be working on ties medical graft approval for this patient as well.  Procedure: Excisional Debridement of Wound~stagnant Rationale: Removal of non-viable soft tissue from the wound to promote healing.  Anesthesia: none Pre-Debridement Wound Measurements: 2.5 cm x 1.5 cm x 0.3 cm  Post-Debridement Wound Measurements: Same Type of Debridement: Sharp Excisional Tissue Removed: Non-viable soft tissue Depth of Debridement: subcutaneous tissue. Technique: Sharp excisional debridement to bleeding, viable wound base.  Dressing: Dry, sterile, compression dressing. Disposition: Patient tolerated procedure well. Patient to return in 1 week for follow-up.  No follow-ups on file.

## 2019-11-16 ENCOUNTER — Telehealth: Payer: Self-pay | Admitting: *Deleted

## 2019-11-16 ENCOUNTER — Telehealth: Payer: Self-pay

## 2019-11-16 DIAGNOSIS — I739 Peripheral vascular disease, unspecified: Secondary | ICD-10-CM

## 2019-11-16 DIAGNOSIS — L97412 Non-pressure chronic ulcer of right heel and midfoot with fat layer exposed: Secondary | ICD-10-CM

## 2019-11-16 NOTE — Telephone Encounter (Signed)
Referral for vascular studies and consult if abnormal have been entered in chart

## 2019-11-16 NOTE — Telephone Encounter (Signed)
-----   Message from Marissa Nestle, RN sent at 11/16/2019  8:27 AM EDT ----- Regarding: FW: Home care supplies Angie, please assist pt. Joya San  ----- Message ----- From: Candelaria Stagers, DPM Sent: 11/15/2019   1:06 PM EDT To: Marissa Nestle, RN Subject: Home care supplies                             Hi Joya San,  Would you be able to her home care supplies that includes Betadine wet-to-dry, 4 x 4 gauze, Kerlix, Ace bandage, Coban.  Let me know if you have any questions  Thanks

## 2019-11-16 NOTE — Telephone Encounter (Signed)
-----   Message from Candelaria Stagers, DPM sent at 11/15/2019  1:04 PM EDT ----- Regarding: ABI/PVR Hi Valery,   Would you be  able to order ABIs PVRs for this patient.  Patient has a right heel ulceration.  Thanks Caryn Bee

## 2019-11-17 ENCOUNTER — Telehealth: Payer: Self-pay

## 2019-11-19 ENCOUNTER — Other Ambulatory Visit: Payer: Self-pay | Admitting: Urology

## 2019-11-19 DIAGNOSIS — N401 Enlarged prostate with lower urinary tract symptoms: Secondary | ICD-10-CM

## 2019-11-19 DIAGNOSIS — N138 Other obstructive and reflux uropathy: Secondary | ICD-10-CM

## 2019-11-24 ENCOUNTER — Other Ambulatory Visit: Payer: Self-pay

## 2019-11-24 ENCOUNTER — Ambulatory Visit (INDEPENDENT_AMBULATORY_CARE_PROVIDER_SITE_OTHER): Payer: Medicare Other | Admitting: Podiatry

## 2019-11-24 ENCOUNTER — Encounter: Payer: Self-pay | Admitting: Podiatry

## 2019-11-24 VITALS — Temp 97.9°F

## 2019-11-24 DIAGNOSIS — L97412 Non-pressure chronic ulcer of right heel and midfoot with fat layer exposed: Secondary | ICD-10-CM

## 2019-11-24 DIAGNOSIS — I739 Peripheral vascular disease, unspecified: Secondary | ICD-10-CM

## 2019-11-25 ENCOUNTER — Encounter: Payer: Self-pay | Admitting: Podiatry

## 2019-11-25 ENCOUNTER — Ambulatory Visit (INDEPENDENT_AMBULATORY_CARE_PROVIDER_SITE_OTHER): Payer: Medicare Other

## 2019-11-25 DIAGNOSIS — L97412 Non-pressure chronic ulcer of right heel and midfoot with fat layer exposed: Secondary | ICD-10-CM | POA: Diagnosis not present

## 2019-11-25 DIAGNOSIS — I739 Peripheral vascular disease, unspecified: Secondary | ICD-10-CM

## 2019-11-25 NOTE — Progress Notes (Signed)
Subjective:  Patient ID: Terry Vaughn, male    DOB: 1933-04-10,  MRN: [id number]  Chief Complaint  Patient presents with  . Foot Ulcer    "its about the same"    86 y.o. male presents for wound care.  Patient states is doing well he presents with a follow-up of right heel ulceration.  He states that he has been trying to do Betadine wet-to-dry dressing changes every other day.  There is still some maceration present to the heel.  He has been ambulating with surgical shoe.  He denies any other acute complaints.  Review of Systems: Negative except as noted in the HPI. Denies N/V/F/Ch.  Past Medical History:  Diagnosis Date  . Bone spur 1988   left heel  . Bursitis 1987   right shoulder  . Cellulitis 1986   right leg  . Pneumonia    noted 07/17/17 CXR   . Shingles 1959  . Urinary retention   . UTI (urinary tract infection)    sepsis E coli UTI +urine and blood cultures 07/17/17     Current Outpatient Medications:  .  aspirin 81 MG chewable tablet, Chew 81 mg by mouth daily., Disp: , Rfl:  .  Ergocalciferol (VITAMIN D2) 400 units TABS, Take 1 tablet by mouth daily., Disp: , Rfl:  .  finasteride (PROSCAR) 5 MG tablet, TAKE 1 TABLET(5 MG) BY MOUTH DAILY, Disp: 90 tablet, Rfl: 2 .  Multiple Vitamins-Minerals (CENTRUM SILVER ULTRA MENS) TABS, Take 1 each by mouth daily., Disp: , Rfl:  .  omeprazole (PRILOSEC) 20 MG capsule, omeprazole 20 mg capsule,delayed release, Disp: , Rfl:  .  potassium chloride (KLOR-CON) 10 MEQ tablet, potassium chloride ER 10 mEq tablet,extended release, Disp: , Rfl:  .  tamsulosin (FLOMAX) 0.4 MG CAPS capsule, TAKE 1 CAPSULE(0.4 MG) BY MOUTH AT BEDTIME, Disp: 90 capsule, Rfl: 2 .  torsemide (DEMADEX) 5 MG tablet, torsemide 5 mg tablet, Disp: 90 tablet, Rfl: 3  Social History   Tobacco Use  Smoking Status Never Smoker  Smokeless Tobacco Never Used    Allergies  Allergen Reactions  . Codeine Other (See Comments)    Alters mental state   Objective:     Vitals:   11/24/19 1549  Temp: 97.9 F (36.6 C)   There is no height or weight on file to calculate BMI. Constitutional Well developed. Well nourished.  Vascular Dorsalis pedis faintly palpable bilaterally. Posterior tibial faintly palpable bilaterally. Capillary refill normal to all digits.  No cyanosis or clubbing noted. Pedal hair growth normal.  Neurologic Normal speech. Oriented to person, place, and time. Protective sensation absent  Dermatologic Wound Location: Right heel ulceration with fat layer exposed Wound Base: Mixed Granular/Fibrotic Peri-wound: Calloused Exudate: None: wound tissue moderate serosanguinous Wound Measurements: -See below  Orthopedic: No pain to palpation either foot.   Radiographs: None however the previous x-ray showed no cortical irregularity consistent with osteomyelitis.  Calcification noted in the Achilles tendon as well as plantar and posterior heel spurring noted.  Arthritic changes noted at the midfoot.  No soft tissue emphysema noted. Assessment:   No diagnosis found. Plan:  Patient was evaluated and treated and all questions answered.  Ulcer right heel with fat layer exposed~stagnant -Debridement as below. -Dressed with Betadine wet-to-dry, DSD. -Continue off-loading with surgical shoe. -Home care supplies were ordered for the patient. -After further debridement the size of the wound had increased from last time when seen by Dr. Stacie Acres.  There was plenty of hyperkeratotic lesions circumferentially  around the wound therefore leading to increase in the wound size. -He is scheduled to get his ABIs PVRs tomorrows as patient has faintly palpable/diminished flow to the right lower extremity.  If there is concerning for vascular compromise will discuss improving the vascular flow by following up with the vascular surgeon. -I will also be working on ties medical graft approval for this patient as well.  Procedure: Excisional Debridement of  Wound~stagnant Rationale: Removal of non-viable soft tissue from the wound to promote healing.  Anesthesia: none Pre-Debridement Wound Measurements: 2.5 cm x 1.5 cm x 0.3 cm  Post-Debridement Wound Measurements: Same Type of Debridement: Sharp Excisional Tissue Removed: Non-viable soft tissue Depth of Debridement: subcutaneous tissue. Technique: Sharp excisional debridement to bleeding, viable wound base.  Dressing: Dry, sterile, compression dressing. Disposition: Patient tolerated procedure well. Patient to return in 1 week for follow-up.  No follow-ups on file.

## 2019-12-01 ENCOUNTER — Other Ambulatory Visit: Payer: Self-pay

## 2019-12-01 ENCOUNTER — Ambulatory Visit (INDEPENDENT_AMBULATORY_CARE_PROVIDER_SITE_OTHER): Payer: Medicare Other | Admitting: Podiatry

## 2019-12-01 DIAGNOSIS — L97412 Non-pressure chronic ulcer of right heel and midfoot with fat layer exposed: Secondary | ICD-10-CM

## 2019-12-01 DIAGNOSIS — I739 Peripheral vascular disease, unspecified: Secondary | ICD-10-CM

## 2019-12-02 ENCOUNTER — Encounter: Payer: Self-pay | Admitting: Podiatry

## 2019-12-02 ENCOUNTER — Telehealth: Payer: Self-pay | Admitting: *Deleted

## 2019-12-02 DIAGNOSIS — L97412 Non-pressure chronic ulcer of right heel and midfoot with fat layer exposed: Secondary | ICD-10-CM

## 2019-12-02 DIAGNOSIS — I739 Peripheral vascular disease, unspecified: Secondary | ICD-10-CM

## 2019-12-02 NOTE — Telephone Encounter (Signed)
-----   Message from Candelaria Stagers, DPM sent at 12/02/2019  7:34 AM EDT ----- Regarding: Referral to the wound care center at [facility],  Would you be able to refer this patient to the [facility] wound care center.  I believe that patient will benefit from hyperbaric oxygen therapy with more aggressive wound care management.  Thank you

## 2019-12-02 NOTE — Progress Notes (Signed)
Subjective:  Patient ID: Terry Vaughn, male    DOB: January 26, 1933,  MRN: [id number]  Chief Complaint  Patient presents with  . Foot Ulcer    pt is here for a skin ulcer f/u of the right foot, pt states that he wants to have his bandages looked at, just in case he is not wearing his shoe right    86 y.o. male presents for wound care.  Patient states is doing well he presents with a follow-up of right heel ulceration.  He states that he has been trying to do Betadine wet-to-dry dressing changes every other day.  There has been excessive drainage present to the right heel.  Even Betadine wet-to-dry dressing changes has not been helping to dry up the wound.  He denies any complaint.  He would like to discuss referral to the wound care center.  Review of Systems: Negative except as noted in the HPI. Denies N/V/F/Ch.  Past Medical History:  Diagnosis Date  . Bone spur 1988   left heel  . Bursitis 1987   right shoulder  . Cellulitis 1986   right leg  . Pneumonia    noted 07/17/17 CXR   . Shingles 1959  . Urinary retention   . UTI (urinary tract infection)    sepsis E coli UTI +urine and blood cultures 07/17/17     Current Outpatient Medications:  .  aspirin 81 MG chewable tablet, Chew 81 mg by mouth daily., Disp: , Rfl:  .  Ergocalciferol (VITAMIN D2) 400 units TABS, Take 1 tablet by mouth daily., Disp: , Rfl:  .  finasteride (PROSCAR) 5 MG tablet, TAKE 1 TABLET(5 MG) BY MOUTH DAILY, Disp: 90 tablet, Rfl: 2 .  Multiple Vitamins-Minerals (CENTRUM SILVER ULTRA MENS) TABS, Take 1 each by mouth daily., Disp: , Rfl:  .  omeprazole (PRILOSEC) 20 MG capsule, omeprazole 20 mg capsule,delayed release, Disp: , Rfl:  .  potassium chloride (KLOR-CON) 10 MEQ tablet, potassium chloride ER 10 mEq tablet,extended release, Disp: , Rfl:  .  tamsulosin (FLOMAX) 0.4 MG CAPS capsule, TAKE 1 CAPSULE(0.4 MG) BY MOUTH AT BEDTIME, Disp: 90 capsule, Rfl: 2 .  torsemide (DEMADEX) 5 MG tablet, torsemide 5 mg  tablet, Disp: 90 tablet, Rfl: 3  Social History   Tobacco Use  Smoking Status Never Smoker  Smokeless Tobacco Never Used    Allergies  Allergen Reactions  . Codeine Other (See Comments)    Alters mental state   Objective:   There were no vitals filed for this visit. There is no height or weight on file to calculate BMI. Constitutional Well developed. Well nourished.  Vascular Dorsalis pedis faintly palpable bilaterally. Posterior tibial faintly palpable bilaterally. Capillary refill normal to all digits.  No cyanosis or clubbing noted. Pedal hair growth normal.  Neurologic Normal speech. Oriented to person, place, and time. Protective sensation absent  Dermatologic Wound Location: Right heel ulceration with fat layer exposed Wound Base: Mixed Granular/Fibrotic Peri-wound: Calloused Exudate: None: wound tissue moderate serosanguinous Wound Measurements: -See below  Orthopedic: No pain to palpation either foot.   Radiographs: None however the previous x-ray showed no cortical irregularity consistent with osteomyelitis.  Calcification noted in the Achilles tendon as well as plantar and posterior heel spurring noted.  Arthritic changes noted at the midfoot.  No soft tissue emphysema noted. Assessment:   1. PVD (peripheral vascular disease) ([place])   2. Skin ulcer of right heel with fat layer exposed ([place])    Plan:  Patient was evaluated and  treated and all questions answered.  Ulcer right heel with fat layer exposed~stagnant -Debridement as below. -Dressed with Betadine wet-to-dry, DSD. -Continue off-loading with surgical shoe. -After further debridement the size of the wound had increased from last time when seen by Dr. Stacie Acres.  There was plenty of hyperkeratotic lesions circumferentially around the wound therefore leading to increase in the wound size. -ABIs PVRs were reviewed with the patient: It appears that they are within normal limits and does not have limitation to  the flow. -It appears that I may be very limited with the wound care products I have an office I believe patient will benefit from evaluation and management of this right lower extremity wound at the wound care center. -I will send in referral for the wound care center.  Procedure: Excisional Debridement of Wound~stagnant Rationale: Removal of non-viable soft tissue from the wound to promote healing.  Anesthesia: none Pre-Debridement Wound Measurements: 2.5 cm x 1.5 cm x 0.3 cm  Post-Debridement Wound Measurements: Same Type of Debridement: Sharp Excisional Tissue Removed: Non-viable soft tissue Depth of Debridement: subcutaneous tissue. Technique: Sharp excisional debridement to bleeding, viable wound base.  Dressing: Dry, sterile, compression dressing. Disposition: Patient tolerated procedure well. Patient to return in 1 week for follow-up.  No follow-ups on file.

## 2019-12-02 NOTE — Telephone Encounter (Signed)
Faxed referral, clinicals and demographics to [facility] - [place].

## 2019-12-07 ENCOUNTER — Encounter: Payer: Medicare Other | Attending: Internal Medicine | Admitting: Internal Medicine

## 2019-12-07 ENCOUNTER — Inpatient Hospital Stay
Admission: EM | Admit: 2019-12-07 | Discharge: 2019-12-13 | DRG: 580 | Disposition: A | Discharge status: Skilled Nursing Facility | Payer: Medicare Other | Source: Ambulatory Visit | Attending: Internal Medicine | Admitting: Internal Medicine

## 2019-12-07 ENCOUNTER — Emergency Department: Payer: Medicare Other

## 2019-12-07 ENCOUNTER — Other Ambulatory Visit: Payer: Self-pay

## 2019-12-07 ENCOUNTER — Encounter: Payer: Self-pay | Admitting: Emergency Medicine

## 2019-12-07 DIAGNOSIS — Z09 Encounter for follow-up examination after completed treatment for conditions other than malignant neoplasm: Secondary | ICD-10-CM

## 2019-12-07 DIAGNOSIS — N179 Acute kidney failure, unspecified: Secondary | ICD-10-CM

## 2019-12-07 DIAGNOSIS — I872 Venous insufficiency (chronic) (peripheral): Secondary | ICD-10-CM | POA: Diagnosis not present

## 2019-12-07 DIAGNOSIS — G629 Polyneuropathy, unspecified: Secondary | ICD-10-CM | POA: Diagnosis present

## 2019-12-07 DIAGNOSIS — I739 Peripheral vascular disease, unspecified: Secondary | ICD-10-CM | POA: Diagnosis present

## 2019-12-07 DIAGNOSIS — L97418 Non-pressure chronic ulcer of right heel and midfoot with other specified severity: Secondary | ICD-10-CM

## 2019-12-07 DIAGNOSIS — D72829 Elevated white blood cell count, unspecified: Secondary | ICD-10-CM

## 2019-12-07 DIAGNOSIS — L02611 Cutaneous abscess of right foot: Secondary | ICD-10-CM | POA: Diagnosis present

## 2019-12-07 DIAGNOSIS — L03115 Cellulitis of right lower limb: Secondary | ICD-10-CM | POA: Diagnosis present

## 2019-12-07 DIAGNOSIS — Z79899 Other long term (current) drug therapy: Secondary | ICD-10-CM | POA: Diagnosis not present

## 2019-12-07 DIAGNOSIS — Z96643 Presence of artificial hip joint, bilateral: Secondary | ICD-10-CM | POA: Diagnosis present

## 2019-12-07 DIAGNOSIS — L97514 Non-pressure chronic ulcer of other part of right foot with necrosis of bone: Secondary | ICD-10-CM | POA: Diagnosis present

## 2019-12-07 DIAGNOSIS — Q6671 Congenital pes cavus, right foot: Secondary | ICD-10-CM

## 2019-12-07 DIAGNOSIS — Z20822 Contact with and (suspected) exposure to covid-19: Secondary | ICD-10-CM | POA: Diagnosis present

## 2019-12-07 DIAGNOSIS — Z885 Allergy status to narcotic agent status: Secondary | ICD-10-CM | POA: Diagnosis not present

## 2019-12-07 DIAGNOSIS — L089 Local infection of the skin and subcutaneous tissue, unspecified: Secondary | ICD-10-CM

## 2019-12-07 DIAGNOSIS — N400 Enlarged prostate without lower urinary tract symptoms: Secondary | ICD-10-CM | POA: Diagnosis present

## 2019-12-07 DIAGNOSIS — B964 Proteus (mirabilis) (morganii) as the cause of diseases classified elsewhere: Secondary | ICD-10-CM | POA: Diagnosis present

## 2019-12-07 DIAGNOSIS — Z7982 Long term (current) use of aspirin: Secondary | ICD-10-CM

## 2019-12-07 DIAGNOSIS — M869 Osteomyelitis, unspecified: Secondary | ICD-10-CM | POA: Diagnosis present

## 2019-12-07 DIAGNOSIS — Z9049 Acquired absence of other specified parts of digestive tract: Secondary | ICD-10-CM

## 2019-12-07 LAB — COMPREHENSIVE METABOLIC PANEL
ALT: 22 U/L (ref 0–44)
AST: 22 U/L (ref 15–41)
Albumin: 4.2 g/dL (ref 3.5–5.0)
Alkaline Phosphatase: 60 U/L (ref 38–126)
Anion gap: 9 (ref 5–15)
BUN: 24 mg/dL — ABNORMAL HIGH (ref 8–23)
CO2: 28 mmol/L (ref 22–32)
Calcium: 8.9 mg/dL (ref 8.9–10.3)
Chloride: 101 mmol/L (ref 98–111)
Creatinine, Ser: 1.47 mg/dL — ABNORMAL HIGH (ref 0.61–1.24)
GFR calc Af Amer: 50 mL/min — ABNORMAL LOW (ref >60)
GFR calc non Af Amer: 43 mL/min — ABNORMAL LOW (ref >60)
Glucose, Bld: 99 mg/dL (ref 70–99)
Potassium: 4.5 mmol/L (ref 3.5–5.1)
Sodium: 138 mmol/L (ref 135–145)
Total Bilirubin: 1.8 mg/dL — ABNORMAL HIGH (ref 0.3–1.2)
Total Protein: 7.2 g/dL (ref 6.5–8.1)

## 2019-12-07 LAB — URINALYSIS, COMPLETE (UACMP) WITH MICROSCOPIC
Bacteria, UA: NONE SEEN
Bilirubin Urine: NEGATIVE
Glucose, UA: NEGATIVE mg/dL
Ketones, ur: 5 mg/dL — AB
Leukocytes,Ua: NEGATIVE
Nitrite: NEGATIVE
Protein, ur: NEGATIVE mg/dL
Specific Gravity, Urine: 1.018 (ref 1.005–1.030)
Squamous Epithelial / HPF: NONE SEEN (ref 0–5)
pH: 6 (ref 5.0–8.0)

## 2019-12-07 LAB — HEMOGLOBIN A1C
Hgb A1c MFr Bld: 5.4 % (ref 4.8–5.6)
Mean Plasma Glucose: 108.28 mg/dL

## 2019-12-07 LAB — CBC WITH DIFFERENTIAL/PLATELET
Abs Immature Granulocytes: 0.06 10*3/uL (ref 0.00–0.07)
Basophils Absolute: 0 10*3/uL (ref 0.0–0.1)
Basophils Relative: 0 %
Eosinophils Absolute: 0 10*3/uL (ref 0.0–0.5)
Eosinophils Relative: 0 %
HCT: 39.9 % (ref 39.0–52.0)
Hemoglobin: 13.5 g/dL (ref 13.0–17.0)
Immature Granulocytes: 0 %
Lymphocytes Relative: 9 %
Lymphs Abs: 1.2 10*3/uL (ref 0.7–4.0)
MCH: 32.9 pg (ref 26.0–34.0)
MCHC: 33.8 g/dL (ref 30.0–36.0)
MCV: 97.3 fL (ref 80.0–100.0)
Monocytes Absolute: 1.2 10*3/uL — ABNORMAL HIGH (ref 0.1–1.0)
Monocytes Relative: 9 %
Neutro Abs: 10.9 10*3/uL — ABNORMAL HIGH (ref 1.7–7.7)
Neutrophils Relative %: 82 %
Platelets: 341 10*3/uL (ref 150–400)
RBC: 4.1 MIL/uL — ABNORMAL LOW (ref 4.22–5.81)
RDW: 13.3 % (ref 11.5–15.5)
WBC: 13.5 10*3/uL — ABNORMAL HIGH (ref 4.0–10.5)
nRBC: 0 % (ref 0.0–0.2)

## 2019-12-07 LAB — CREATININE, SERUM
Creatinine, Ser: 1.45 mg/dL — ABNORMAL HIGH (ref 0.61–1.24)
GFR calc Af Amer: 51 mL/min — ABNORMAL LOW (ref >60)
GFR calc non Af Amer: 44 mL/min — ABNORMAL LOW (ref >60)

## 2019-12-07 LAB — SARS CORONAVIRUS 2 BY RT PCR (HOSPITAL ORDER, PERFORMED IN ~~LOC~~ HOSPITAL LAB): SARS Coronavirus 2: NEGATIVE

## 2019-12-07 LAB — TSH: TSH: 2.771 u[IU]/mL (ref 0.350–4.500)

## 2019-12-07 MED ORDER — SODIUM CHLORIDE 0.9 % IV SOLN
1.0000 g | Freq: Three times a day (TID) | INTRAVENOUS | Status: DC
  Administered 2019-12-07 – 2019-12-08 (×2): 1 g via INTRAVENOUS
  Filled 2019-12-07 (×3): qty 1

## 2019-12-07 MED ORDER — SODIUM CHLORIDE 0.9 % IV SOLN: 2.0000 g | Freq: Once | INTRAVENOUS | Status: DC

## 2019-12-07 MED ORDER — SODIUM CHLORIDE 0.9% FLUSH: 3.0000 mL | Freq: Once | INTRAVENOUS | Status: DC

## 2019-12-07 MED ORDER — CHOLECALCIFEROL 10 MCG (400 UNIT) PO TABS
400.0000 [IU] | ORAL_TABLET | Freq: Every day | ORAL | Status: DC
  Administered 2019-12-08 – 2019-12-13 (×6): 400 [IU] via ORAL
  Filled 2019-12-07 (×8): qty 1

## 2019-12-07 MED ORDER — ADULT MULTIVITAMIN W/MINERALS CH
1.0000 | ORAL_TABLET | Freq: Every day | ORAL | Status: DC
  Administered 2019-12-07 – 2019-12-13 (×7): 1 via ORAL
  Filled 2019-12-07 (×7): qty 1

## 2019-12-07 MED ORDER — FINASTERIDE 5 MG PO TABS
5.0000 mg | ORAL_TABLET | Freq: Every day | ORAL | Status: DC
  Administered 2019-12-07 – 2019-12-13 (×7): 5 mg via ORAL
  Filled 2019-12-07 (×8): qty 1

## 2019-12-07 MED ORDER — DOCUSATE SODIUM 100 MG PO CAPS
100.0000 mg | ORAL_CAPSULE | Freq: Two times a day (BID) | ORAL | Status: DC
  Administered 2019-12-07 – 2019-12-12 (×11): 100 mg via ORAL
  Filled 2019-12-07 (×11): qty 1

## 2019-12-07 MED ORDER — FLEET ENEMA 7-19 GM/118ML RE ENEM: 1.0000 | ENEMA | Freq: Once | RECTAL | Status: DC | PRN

## 2019-12-07 MED ORDER — ASPIRIN 81 MG PO CHEW
81.0000 mg | CHEWABLE_TABLET | Freq: Every day | ORAL | Status: DC
  Administered 2019-12-07 – 2019-12-13 (×7): 81 mg via ORAL
  Filled 2019-12-07 (×7): qty 1

## 2019-12-07 MED ORDER — SENNA 8.6 MG PO TABS
1.0000 | ORAL_TABLET | Freq: Two times a day (BID) | ORAL | Status: DC
  Administered 2019-12-07 – 2019-12-12 (×11): 8.6 mg via ORAL
  Filled 2019-12-07 (×11): qty 1

## 2019-12-07 MED ORDER — ONDANSETRON HCL 4 MG/2ML IJ SOLN: 4.0000 mg | Freq: Four times a day (QID) | INTRAMUSCULAR | Status: DC | PRN

## 2019-12-07 MED ORDER — VANCOMYCIN HCL 1500 MG/300ML IV SOLN
1500.0000 mg | INTRAVENOUS | Status: DC
  Filled 2019-12-07: qty 300

## 2019-12-07 MED ORDER — HEPARIN SODIUM (PORCINE) 5000 UNIT/ML IJ SOLN
5000.0000 [IU] | Freq: Three times a day (TID) | INTRAMUSCULAR | Status: DC
  Administered 2019-12-07 – 2019-12-13 (×16): 5000 [IU] via SUBCUTANEOUS
  Filled 2019-12-07 (×17): qty 1

## 2019-12-07 MED ORDER — VANCOMYCIN HCL 2000 MG/400ML IV SOLN
2000.0000 mg | Freq: Once | INTRAVENOUS | Status: AC
  Administered 2019-12-07: 2000 mg via INTRAVENOUS
  Filled 2019-12-07: qty 400

## 2019-12-07 MED ORDER — SODIUM CHLORIDE 0.9 % IV SOLN
1.0000 g | Freq: Once | INTRAVENOUS | Status: DC
  Filled 2019-12-07: qty 1

## 2019-12-07 MED ORDER — SENNOSIDES-DOCUSATE SODIUM 8.6-50 MG PO TABS: 1.0000 | ORAL_TABLET | Freq: Every evening | ORAL | Status: DC | PRN

## 2019-12-07 MED ORDER — PANTOPRAZOLE SODIUM 40 MG PO TBEC
40.0000 mg | DELAYED_RELEASE_TABLET | Freq: Every day | ORAL | Status: DC
  Administered 2019-12-07 – 2019-12-13 (×7): 40 mg via ORAL
  Filled 2019-12-07 (×7): qty 1

## 2019-12-07 MED ORDER — ONDANSETRON HCL 4 MG PO TABS: 4.0000 mg | ORAL_TABLET | Freq: Four times a day (QID) | ORAL | Status: DC | PRN

## 2019-12-07 MED ORDER — VANCOMYCIN HCL IN DEXTROSE 1-5 GM/200ML-% IV SOLN: 1000.0000 mg | Freq: Once | INTRAVENOUS | Status: DC

## 2019-12-07 MED ORDER — TAMSULOSIN HCL 0.4 MG PO CAPS
0.4000 mg | ORAL_CAPSULE | Freq: Every day | ORAL | Status: DC
  Administered 2019-12-07 – 2019-12-08 (×2): 0.4 mg via ORAL
  Filled 2019-12-07 (×2): qty 1

## 2019-12-07 NOTE — Progress Notes (Addendum)
PODIATRY / FOOT AND ANKLE SURGERY CONSULTATION NOTE  Requesting Physician: Dr. Max Sane  Reason for consult: Right heel infection  Chief Complaint: Right heel wound/drainage   HPI: Terry Vaughn is a 86 y.o. male who presents with a chronic ulceration to the plantar aspect of the right heel.  Patient was seen by Dr. Posey Pronto at [facility] due to this ulceration a number of months ago.  Patient was seen today as a consultation at [facility] wound care center was told to come to the emergency room due to the condition of the wound and concern for possible bone infection.  X-rays were not taken at the time.  Patient was noted to have increased redness and swelling around the periphery of the wound and the wound was noted to probe very close to bone.  Patient subsequently was seen in the emergency room and an MRI was performed which did not show any bone infection to the calcaneus or notable abscess.  Podiatry team was consulted for further evaluation.  Patient denies any history of diabetes or neuropathy.  Patient does have some numbness to the right foot.  He notes he has had a couple of right lower extremity surgeries in the past for his hip.  PMHx:  Past Medical History:  Diagnosis Date  . Bone spur 1988   left heel  . Bursitis 1987   right shoulder  . Cellulitis 1986   right leg  . Pneumonia    noted 07/17/17 CXR   . Shingles 1959  . Urinary retention   . UTI (urinary tract infection)    sepsis E coli UTI +urine and blood cultures 07/17/17     Surgical Hx:  Past Surgical History:  Procedure Laterality Date  . APPENDECTOMY  1942  . CHOLECYSTECTOMY  1966  . EYE SURGERY  2015   catarcts extracted  . JOINT REPLACEMENT     right hip 20 + years from 2019, left hip replaced 05/01/17   . MELANOMA EXCISION  04.2010   mole removal right arm  . TONSILLECTOMY AND ADENOIDECTOMY  1944  . TOTAL HIP ARTHROPLASTY  1999   right hip    FHx:  Family History  Problem Relation Age  of Onset  . Clotting disorder Mother   . Heart disease Father 71  . Cancer Sister        breast  . Cerebral aneurysm Brother        hemm[place]  . Cancer Daughter        breast    Social History:  reports that he has never smoked. He has never used smokeless tobacco. He reports that he does not drink alcohol or use drugs.  Allergies:  Allergies  Allergen Reactions  . Codeine Other (See Comments)    Alters mental state    Review of Systems: General ROS: negative Respiratory ROS: no cough, shortness of breath, or wheezing Cardiovascular ROS: no chest pain or dyspnea on exertion Gastrointestinal ROS: no abdominal pain, change in bowel habits, or black or bloody stools Musculoskeletal ROS: positive for - joint pain, joint stiffness and joint swelling Neurological ROS: positive for - numbness/tingling Dermatological ROS: positive for Right heel ulcer  (Not in a hospital admission)   Physical Exam: General: Alert and oriented.  No apparent distress.  Vascular: DP/PT pulses palpable bilateral.  Capillary fill time appears to be intact to digits bilateral.  No hair growth present to digits bilateral.  Mild to moderate erythema around the periphery of  the right heel around the ulceration with mild nonpitting edema to the right lower extremity.  Neuro: Light touch sensation reduced to bilateral lower extremities right greater than left.  Derm: Ulceration to the plantar aspect of the right heel that probes very close to bone and is likely at the plantar fascial level.  Mild amount of purulence able to be expressed from the wound with pain on palpation.  Appears to have periwound erythema and edema consistent with cellulitic changes.  Large hyperkeratotic periphery to the wound with mild maceration present around the borders of the wound as well.  Mild odor.  Central plug of fibronecrotic tissue within the wound but the periphery of the wound appears to be granular.  Wound measures 3 cm x  2.5 cm x 1 cm.    MSK: Right plantar heel pain.  Hammertoe contractures digits 2 through 5 bilateral, rigid.  Pes cavus foot type bilateral right greater than left.  Results for orders placed or performed during the hospital encounter of 12/07/19 (from the past 48 hour(s))  Comprehensive metabolic panel     Status: Abnormal   Collection Time: 12/07/19  2:13 PM  Result Value Ref Range   Sodium 138 135 - 145 mmol/L   Potassium 4.5 3.5 - 5.1 mmol/L   Chloride 101 98 - 111 mmol/L   CO2 28 22 - 32 mmol/L   Glucose, Bld 99 70 - 99 mg/dL    Comment: Glucose reference range applies only to samples taken after fasting for at least 8 hours.   BUN 24 (H) 8 - 23 mg/dL   Creatinine, Ser 1.47 (H) 0.61 - 1.24 mg/dL   Calcium 8.9 8.9 - 10.3 mg/dL   Total Protein 7.2 6.5 - 8.1 g/dL   Albumin 4.2 3.5 - 5.0 g/dL   AST 22 15 - 41 U/L   ALT 22 0 - 44 U/L   Alkaline Phosphatase 60 38 - 126 U/L   Total Bilirubin 1.8 (H) 0.3 - 1.2 mg/dL   GFR calc non Af Amer 43 (L) >60 mL/min   GFR calc Af Amer 50 (L) >60 mL/min   Anion gap 9 5 - 15    Comment: Performed at [facility], [address] [place], [place] [address]  CBC with Differential     Status: Abnormal   Collection Time: 12/07/19  2:13 PM  Result Value Ref Range   WBC 13.5 (H) 4.0 - 10.5 K/uL   RBC 4.10 (L) 4.22 - 5.81 MIL/uL   Hemoglobin 13.5 13.0 - 17.0 g/dL   HCT 39.9 39.0 - 52.0 %   MCV 97.3 80.0 - 100.0 fL   MCH 32.9 26.0 - 34.0 pg   MCHC 33.8 30.0 - 36.0 g/dL   RDW 13.3 11.5 - 15.5 %   Platelets 341 150 - 400 K/uL   nRBC 0.0 0.0 - 0.2 %   Neutrophils Relative % 82 %   Neutro Abs 10.9 (H) 1.7 - 7.7 K/uL   Lymphocytes Relative 9 %   Lymphs Abs 1.2 0.7 - 4.0 K/uL   Monocytes Relative 9 %   Monocytes Absolute 1.2 (H) 0.1 - 1.0 K/uL   Eosinophils Relative 0 %   Eosinophils Absolute 0.0 0.0 - 0.5 K/uL   Basophils Relative 0 %   Basophils Absolute 0.0 0.0 - 0.1 K/uL   Immature Granulocytes 0 %   Abs Immature Granulocytes  0.06 0.00 - 0.07 K/uL    Comment: Performed at [facility], [address],  [place], [place] [address]  TSH     Status: None   Collection Time: 12/07/19  2:13 PM  Result Value Ref Range   TSH 2.771 0.350 - 4.500 uIU/mL    Comment: Performed by a 3rd Generation assay with a functional sensitivity of <=0.01 uIU/mL. Performed at [facility], [address] [place], [place] [address]   Creatinine, serum     Status: Abnormal   Collection Time: 12/07/19  2:13 PM  Result Value Ref Range   Creatinine, Ser 1.45 (H) 0.61 - 1.24 mg/dL   GFR calc non Af Amer 44 (L) >60 mL/min   GFR calc Af Amer 51 (L) >60 mL/min    Comment: Performed at [facility], [address] [place], [place] [address]  SARS Coronavirus 2 by RT PCR (hospital order, performed in [facility] hospital lab) Nasopharyngeal Nasopharyngeal Swab     Status: None   Collection Time: 12/07/19  3:49 PM   Specimen: Nasopharyngeal Swab  Result Value Ref Range   SARS Coronavirus 2 NEGATIVE NEGATIVE    Comment: (NOTE) SARS-CoV-2 target nucleic acids are NOT DETECTED. The SARS-CoV-2 RNA is generally detectable in upper and lower respiratory specimens during the acute phase of infection. The lowest concentration of SARS-CoV-2 viral copies this assay can detect is 250 copies / mL. A negative result does not preclude SARS-CoV-2 infection and should not be used as the sole basis for treatment or other patient management decisions.  A negative result may occur with improper specimen collection / handling, submission of specimen other than nasopharyngeal swab, presence of viral mutation(s) within the areas targeted by this assay, and inadequate number of viral copies (<250 copies / mL). A negative result must be combined with clinical observations, patient history, and epidemiological information. Fact Sheet for Patients:   StrictlyIdeas.no Fact Sheet for Healthcare  Providers: BankingDealers.co.za This test is not yet approved or cleared  by the [place] FDA and has been authorized for detection and/or diagnosis of SARS-CoV-2 by FDA under an Emergency Use Authorization (EUA).  This EUA will remain in effect (meaning this test can be used) for the duration of the COVID-19 declaration under Section 564(b)(1) of the Act, 21 U.S.C. section 360bbb-3(b)(1), unless the authorization is terminated or revoked sooner. Performed at [facility], [address] [place], [place] [address]    MR FOOT RIGHT WO CONTRAST  Result Date: 12/07/2019 CLINICAL DATA:  Right heel ulcer EXAM: MRI OF THE RIGHT HINDFOOT WITHOUT CONTRAST TECHNIQUE: Multiplanar, multisequence MR imaging of the ankle/hindfoot was performed. No intravenous contrast was administered. COMPARISON:  X-ray 11/07/2019 FINDINGS: Plantar soft tissue ulceration of the right heel underlying the posterior calcaneus (series 8, image 11). There is underlying soft tissue edema without a discrete sinus tract or fluid collection extending to the underlying cortex. Preservation of the fatty bone marrow signal within the calcaneus without edema or cortical destruction to suggest osteomyelitis. TENDONS Peroneal: Intact peroneus longus and peroneus brevis tendons. Posteromedial: Distal tibialis posterior tendon is thickened and intermediate in signal suggesting tendinosis. Flexor digitorum longus and flexor hallucis longus tendons intact. No tenosynovitis. Anterior: Intact tibialis anterior, extensor hallucis longus and extensor digitorum longus tendons. Achilles: Thickening of the distal Achilles tendon measuring 16 mm in AP dimension with intermediate intrasubstance signal and a moderate partial-thickness insertional tear which measures 2.2 cm in transverse dimension (series 5, image 15; series 8, images 8-11). There are multiple ossifications within the distal tendon. No full-thickness  Achilles tendon tear. No retrocalcaneal bursitis or  peritendinous edema. Plantar Fascia: Fusiform thickening of the central band of the plantar fascia proximally. LIGAMENTS Lateral: The anterior and posterior tibiofibular ligaments are intact. The anterior and posterior talofibular ligaments are intact. Intact calcaneofibular ligament. Medial: Intact. CARTILAGE Ankle Joint: No joint effusion or chondral defect. Subtalar Joints/Sinus Tarsi: No joint effusion or chondral defect. Preservation of the anatomic fat within the sinus tarsi. Bones: No acute fracture. No dislocation. Degenerative subchondral marrow changes at the first tarsometatarsal joint. No evidence of osteomyelitis. Other: Diffuse subcutaneous edema. No organized fluid collection. IMPRESSION: 1. Negative for acute osteomyelitis. 2. Soft tissue ulceration and cellulitis of the right heel underlying the posterior calcaneus without discrete sinus tract or fluid collection extending to the underlying cortex. No abnormal marrow signal within the calcaneus. 3. Distal Achilles tendinosis with moderate partial-thickness insertional tear. 4. Distal tibialis posterior tendinosis. 5. Findings suggesting chronic plantar fasciitis. Electronically Signed   By: Davina Poke D.O.   On: 12/07/2019 16:15    Blood pressure (!) 149/71, pulse 68, temperature 98.2 F (36.8 C), temperature source Oral, resp. rate 16, height 6' 2"  (1.88 m), weight 122.5 kg, SpO2 100 %.  Assessment 1. Right foot cellulitis secondary to ulceration at the plantar aspect of the right heel to the level of the plantar fascia and subcutaneous fat 2. Neuropathy 3. Pes cavus with calcaneal gait  Plan -Patient seen and examined. -Previous x-rays and MRI reviewed and discussed with patient in detail.  No abscess or evidence of osteomyelitis is seen at this time but does appear to have a large plantar heel ulceration with necrosis of tissues. -Patient clinically has cellulitic changes to  the right heel area with a large ulceration that probes fairly deep to the plantar fascia and subcutaneous tissue.  Fairly close to bone overall. -100% excisional wound debridement performed to the level of the plantar fascia/muscle removing all hyperkeratotic tissue around the periphery of the wound as well as removing central fibrotic plug with 10 blade and suture removal kit to the right heel ulcer.  The wound bed appeared to be granular and bleeding but there appeared to be still a small central area of necrotic tissue present. -Wound culture taken and sent off.  Appreciate medicine recommendations for antibiotic therapy.  We will continue to follow culture results. -Dressed with Betadine soaked 4 x 4's, ABD, Kerlix, Ace wrap with mild compression.  Patient have dressing changes performed daily. -Appreciate vascular recommendations.  Believe patient has sufficient blood flow but patient has calcinosis of arteries that is able to be visualized on x-ray imaging.  Likely has some component of vascular disease that could have contributed to patient's slow wound healing. -Patient to be nonweightbearing at all times to the right lower extremity.  Order placed. -Discussed all treatment options with the patient both conservative and surgical attempts at correction including potential risks and complications of surgical intervention at this time patient has elected for procedure consisting of right heel wound debridement including all nonviable necrotic tissues to the level of bone with possible bone biopsy/culture, possible antibiotic bead placement.  To perform procedure at some point on Friday afternoon on 12/09/2019.  All questions answered. -Discussed with patient that this wound is likely going to take a very long time to heal and we will likely have to apply wound VAC after the procedure as well as see him in clinic at least once a week to provide wound care.  We could also potentially look into the  potential for putting grafts on the  area in the future once the infection is gone.  Patient understands. -Patient to be NPO at 0001 on 12/09/19 for surgery. Surgery scheduled for 12pm on 12/09/19.  Caroline More, DPM 12/07/2019, 6:19 PM

## 2019-12-07 NOTE — ED Notes (Signed)
Message sent ot pharmacy to verify remaining meds

## 2019-12-07 NOTE — ED Provider Notes (Signed)
[facility] Emergency Department Provider Note   ____________________________________________   First MD Initiated Contact with Patient 12/07/19 1455     (approximate)  I have reviewed the triage vital signs and the nursing notes.   HISTORY  Chief Complaint Wound Infection    HPI Terry Vaughn is a 86 y.o. male history of prior pneumonia shingles urinary tract infection  Patient presents for evaluation for right foot ulcer.  He reports that he was seen today at the wound clinic they sent him here for antibiotics and an MRI told him they would see him in a week after he got out of the hospital.  Reports he is been having tenderness redness pain and abnormal odor coming from his right foot for several days  The right heel is very sore.  He had an ulcer present has been being followed by podiatry Dr. Allena Katz since about March but it does not seem to be getting better seem to be getting worse  No noted fevers or chills.  No known Covid exposure.  He reports he had 2 Covid vaccinations at this point   Past Medical History:  Diagnosis Date  . Bone spur 1988   left heel  . Bursitis 1987   right shoulder  . Cellulitis 1986   right leg  . Pneumonia    noted 07/17/17 CXR   . Shingles 1959  . Urinary retention   . UTI (urinary tract infection)    sepsis E coli UTI +urine and blood cultures 07/17/17     Patient Active Problem List   Diagnosis Date Noted  . Osteomyelitis (HCC) 12/07/2019  . Weight gain 04/16/2018  . BPH with urinary obstruction 07/18/2017  . Colitis due to Clostridium difficile 07/18/2017  . S/P total hip arthroplasty 07/06/2017  . Primary osteoarthritis of left hip 05/27/2017  . Acute renal insufficiency   . Infected hydrocele   . Left hip pain 04/15/2017  . Varicose veins of leg with swelling, right 05/20/2016  . Leg swelling 05/08/2016  . Osteoarthritis of knee 05/08/2016  . Achilles tendinitis 03/06/2016  . Pain in left foot  03/06/2016  . Encounter to establish care 01/28/2012  . Hypertension 12/12/2011  . Edema 12/12/2011  . Obesity 12/12/2011    Past Surgical History:  Procedure Laterality Date  . APPENDECTOMY  1942  . CHOLECYSTECTOMY  1966  . EYE SURGERY  2015   catarcts extracted  . JOINT REPLACEMENT     right hip 20 + years from 2019, left hip replaced 05/01/17   . MELANOMA EXCISION  04.2010   mole removal right arm  . TONSILLECTOMY AND ADENOIDECTOMY  1944  . TOTAL HIP ARTHROPLASTY  1999   right hip    Prior to Admission medications   Medication Sig Start Date End Date Taking? Authorizing Provider  acidophilus (RISAQUAD) CAPS capsule Take 1 capsule by mouth daily.   Yes [provider]  aspirin 81 MG chewable tablet Chew 81 mg by mouth daily.   Yes [provider]  docusate sodium (COLACE) 100 MG capsule Take 100 mg by mouth daily as needed for mild constipation.   Yes [provider]  finasteride (PROSCAR) 5 MG tablet TAKE 1 TABLET(5 MG) BY MOUTH DAILY Patient taking differently: Take 5 mg by mouth daily.  11/21/19  Yes McGowan, Carollee Herter A, PA-C  tamsulosin (FLOMAX) 0.4 MG CAPS capsule TAKE 1 CAPSULE(0.4 MG) BY MOUTH AT BEDTIME Patient taking differently: Take 0.4 mg by mouth at bedtime.  11/21/19  Yes McGowan, Shannon A, PA-C    Allergies Codeine  Family History  Problem Relation Age of Onset  . Clotting disorder Mother   . Heart disease Father 35  . Cancer Sister        breast  . Cerebral aneurysm Brother        [place]  . Cancer Daughter        breast    Social History Social History   Tobacco Use  . Smoking status: Never Smoker  . Smokeless tobacco: Never Used  Substance Use Topics  . Alcohol use: No    Alcohol/week: 0.0 standard drinks  . Drug use: No    Review of Systems Constitutional: No fever/chills Eyes: No visual changes. Cardiovascular: Denies chest pain. Respiratory: Denies shortness of breath. Gastrointestinal: No abdominal  pain.   Genitourinary: Negative for dysuria. Musculoskeletal: See HPI Skin: Negative for rash. Neurological: Negative for headaches or weakness.  No numbness.    ____________________________________________   PHYSICAL EXAM:  VITAL SIGNS: ED Triage Vitals  Enc Vitals Group     BP 12/07/19 1410 (!) 149/71     Pulse Rate 12/07/19 1410 86     Resp 12/07/19 1410 16     Temp 12/07/19 1410 98.2 F (36.8 C)     Temp Source 12/07/19 1410 Oral     SpO2 12/07/19 1410 98 %     Weight 12/07/19 1409 270 lb 1 oz (122.5 kg)     Height 12/07/19 1409 6\' 2"  (1.88 m)     Head Circumference --      Peak Flow --      Pain Score 12/07/19 1409 5     Pain Loc --      Pain Edu? --      Excl. in GC? --     Constitutional: Alert and oriented. Well appearing and in no acute distress. Eyes: Conjunctivae are normal. Head: Atraumatic. Nose: No congestion/rhinnorhea. Mouth/Throat: Mucous membranes are moist. Neck: No stridor.  Cardiovascular: Normal rate, regular rhythm.  Respiratory: Normal respiratory effort.  No retractions.  Gastrointestinal: Soft and nontender. No distention. Musculoskeletal: Left lower extremity grossly normal.  Palpable pulses in both lower extremities with strong dorsalis pedis and posterior tibial pulses.  The right calcaneus is erythematous, has an ulcer to it, very abnormal odor, and has a wound about a centimeter in size over the right calcaneus that appears quite deep. Neurologic:  Normal speech and language. No gross focal neurologic deficits are appreciated.  Skin:  Skin is warm, dry and intact. No rash noted. Psychiatric: Mood and affect are normal. Speech and behavior are normal.  ____________________________________________   LABS (all labs ordered are listed, but only abnormal results are displayed)  Labs Reviewed  COMPREHENSIVE METABOLIC PANEL - Abnormal; Notable for the following components:      Result Value   BUN 24 (*)    Creatinine, Ser 1.47 (*)     Total Bilirubin 1.8 (*)    GFR calc non Af Amer 43 (*)    GFR calc Af Amer 50 (*)    All other components within normal limits  CBC WITH DIFFERENTIAL/PLATELET - Abnormal; Notable for the following components:   WBC 13.5 (*)    RBC 4.10 (*)    Neutro Abs 10.9 (*)    Monocytes Absolute 1.2 (*)    All other components within normal limits  URINALYSIS, COMPLETE (UACMP) WITH MICROSCOPIC - Abnormal; Notable for the following components:   Color, Urine YELLOW (*)  APPearance CLEAR (*)    Hgb urine dipstick SMALL (*)    Ketones, ur 5 (*)    All other components within normal limits  CREATININE, SERUM - Abnormal; Notable for the following components:   Creatinine, Ser 1.45 (*)    GFR calc non Af Amer 44 (*)    GFR calc Af Amer 51 (*)    All other components within normal limits  SARS CORONAVIRUS 2 BY RT PCR (HOSPITAL ORDER, [facility])  CULTURE, BLOOD (ROUTINE X 2)  CULTURE, BLOOD (ROUTINE X 2)  AEROBIC/ANAEROBIC CULTURE (SURGICAL/DEEP WOUND)  HEMOGLOBIN A1C  TSH   ____________________________________________  EKG   ____________________________________________  RADIOLOGY  MRI right foot pending at time of admission.  Will be followed up by Dr. Luana Shu of podiatry during inpatient ____________________________________________   PROCEDURES  Procedure(s) performed: None  Procedures  Critical Care performed: No  ____________________________________________   INITIAL IMPRESSION / ASSESSMENT AND PLAN / ED COURSE  Pertinent labs & imaging results that were available during my care of the patient were reviewed by me and considered in my medical decision making (see chart for details).   Obvious right foot infection involving the calcaneal region of the right heel.  Foul odor, redness and tenderness.  It appears infected.  Vascular neuro intact involving both lower feet.  Discussed case with podiatry Dr. Luana Shu, he advises start empiric antibiotic  which we will start vancomycin and cefepime.  Podiatry will provide consultation today and recommends MRI of the right foot without contrast which has been ordered.  Admission discussed with Dr. Manuella Ghazi of the hospitalist service at 3 PM  Patient understand agreeable with plan for admission  Terry Vaughn was evaluated in Emergency Department on 12/07/2019 for the symptoms described in the history of present illness. He was evaluated in the context of the global COVID-19 pandemic, which necessitated consideration that the patient might be at risk for infection with the SARS-CoV-2 virus that causes COVID-19. Institutional protocols and algorithms that pertain to the evaluation of patients at risk for COVID-19 are in a state of rapid change based on information released by regulatory bodies including the CDC and federal and state organizations. These policies and algorithms were followed during the patient's care in the ED.       ____________________________________________   FINAL CLINICAL IMPRESSION(S) / ED DIAGNOSES  Final diagnoses:  Wound infection  Right foot infection        Note:  This document was prepared using Dragon voice recognition software and may include unintentional dictation errors       Delman Kitten, MD 12/07/19 2221

## 2019-12-07 NOTE — ED Notes (Signed)
Pt given urinal.

## 2019-12-07 NOTE — Consult Note (Signed)
WOC Nurse Consult Note: Reason for Consult: WOC Nursing is simultaneously consulted with Podiatry for the right heel wound, plantar aspect. Dr. Excell Seltzer has seen patient and debrided at bedside.  Additionally, Podiatric MD (Dr. Excell Seltzer) has provided Nursing with wound care guidance via the orders and will be following. Wound type:Infectious Pressure Injury POA: N/A Measurement:per Dr. Bernette Redbird note: 3cm x 2.5cm x 1cm  WOC nursing team will not follow, but will remain available to this patient, the nursing and medical teams.  Please re-consult if needed. Thanks, Ladona Mow, MSN, RN, GNP, [place]  Pager# [phone]

## 2019-12-07 NOTE — Consult Note (Signed)
Pharmacy Antibiotic Note  Terry Vaughn is a 86 y.o. male admitted on 12/07/2019 with cellulitis.  Pharmacy has been consulted for Vancomycin and Meropenem dosing.  Plan: Vancomycin 1500 IV every 24 hours per [facility].  Goal trough 10-15 mcg/mL.  2) Meropenem 1g IV Q8 hours  Height: 6\' 2"  (188 cm) Weight: 122.5 kg (270 lb 1 oz) IBW/kg (Calculated) : 82.2  Temp (24hrs), Avg:98.2 F (36.8 C), Min:98.2 F (36.8 C), Max:98.2 F (36.8 C)  Recent Labs  Lab 12/07/19 1413  WBC 13.5*  CREATININE 1.47*    Estimated Creatinine Clearance: 51.1 mL/min (A) (by C-G formula based on SCr of 1.47 mg/dL (H)).    Allergies  Allergen Reactions  . Codeine Other (See Comments)    Alters mental state    Antimicrobials this admission: Vancomycin 5/12  >>  Meropenem 5/12 >>   Microbiology results: 5/12 BCx: pending   Thank you for allowing pharmacy to be a part of this patient's care.  Terry Vaughn A Marynell Bies 12/07/2019 4:02 PM

## 2019-12-07 NOTE — ED Triage Notes (Signed)
Sent from [facility] by Dr. Allena Katz for evaluation of right heel ulcer.  Concerned for Osteomylitis.

## 2019-12-07 NOTE — Progress Notes (Signed)
PHARMACY -  BRIEF ANTIBIOTIC NOTE   Pharmacy has received consult(s) for Vancomycin and Cefepime from an ED provider.  The patient's profile has been reviewed for ht/wt/allergies/indication/available labs.    One time order(s) placed for Vancomycin 2g IV  And Cefepime 2g IV x 2 dose each  Further antibiotics/pharmacy consults should be ordered by admitting physician if indicated.                       Thank you, Bettey Costa 12/07/2019  3:00 PM

## 2019-12-07 NOTE — H&P (Signed)
[place] at [facility]   PATIENT NAME: Terry Vaughn    MR#:  [id number]  DATE OF BIRTH:  1932-11-17  DATE OF ADMISSION:  12/07/2019  PRIMARY CARE PHYSICIAN: Allegra Grana, FNP   REQUESTING/REFERRING PHYSICIAN: Sharyn Creamer, MD  CHIEF COMPLAINT:   Chief Complaint  Patient presents with  . Wound Infection    HISTORY OF PRESENT ILLNESS:  Landry Kamath  is a 86 y.o. male with a known history of chronic right lower extremity foot ulceration is being admitted for worsening pain and infection in the same area. He reports that he was seen today at the wound clinic they sent him here for antibiotics and to obtain MRI to rule out osteomyelitis. He reports he is been having tenderness, redness, pain and abnormal odor coming from his right foot for several days.  He has being followed by podiatry Dr. Nicholes Rough ([facility] since about March.  Ischial debridement done on his office visit on 5/6  No noted fevers or chills.  No known Covid exposure.  He reports he had 2 Covid vaccinations at this point.  His brother is at bedside and patient is thankful for his care here. PAST MEDICAL HISTORY:   Past Medical History:  Diagnosis Date  . Bone spur 1988   left heel  . Bursitis 1987   right shoulder  . Cellulitis 1986   right leg  . Pneumonia    noted 07/17/17 CXR   . Shingles 1959  . Urinary retention   . UTI (urinary tract infection)    sepsis E coli UTI +urine and blood cultures 07/17/17    PAST SURGICAL HISTORY:   Past Surgical History:  Procedure Laterality Date  . APPENDECTOMY  1942  . CHOLECYSTECTOMY  1966  . EYE SURGERY  2015   catarcts extracted  . JOINT REPLACEMENT     right hip 20 + years from 2019, left hip replaced 05/01/17   . MELANOMA EXCISION  04.2010   mole removal right arm  . TONSILLECTOMY AND ADENOIDECTOMY  1944  . TOTAL HIP ARTHROPLASTY  1999   right hip   SOCIAL HISTORY:   Social History   Tobacco Use  . Smoking  status: Never Smoker  . Smokeless tobacco: Never Used  Substance Use Topics  . Alcohol use: No    Alcohol/week: 0.0 standard drinks   FAMILY HISTORY:   Family History  Problem Relation Age of Onset  . Clotting disorder Mother   . Heart disease Father 38  . Cancer Sister        breast  . Cerebral aneurysm Brother        hemmorage  . Cancer Daughter        breast   DRUG ALLERGIES:   Allergies  Allergen Reactions  . Codeine Other (See Comments)    Alters mental state   REVIEW OF SYSTEMS:  Review of Systems  Constitutional: Negative for diaphoresis, fever, malaise/fatigue and weight loss.  HENT: Negative for ear discharge, ear pain, hearing loss, nosebleeds, sore throat and tinnitus.   Eyes: Negative for blurred vision and pain.  Respiratory: Negative for cough, hemoptysis, shortness of breath and wheezing.   Cardiovascular: Negative for chest pain, palpitations, orthopnea and leg swelling.  Gastrointestinal: Negative for abdominal pain, blood in stool, constipation, diarrhea, heartburn, nausea and vomiting.  Genitourinary: Negative for dysuria, frequency and urgency.  Musculoskeletal: Negative for back pain and myalgias.  Skin: Positive for rash. Negative for itching.  Right heel ulcer  Neurological: Negative for dizziness, tingling, tremors, focal weakness, seizures, weakness and headaches.  Psychiatric/Behavioral: Negative for depression. The patient is not nervous/anxious.    MEDICATIONS AT HOME:   Prior to Admission medications   Medication Sig Start Date End Date Taking? Authorizing Provider  aspirin 81 MG chewable tablet Chew 81 mg by mouth daily.    [provider]  Ergocalciferol (VITAMIN D2) 400 units TABS Take 1 tablet by mouth daily.    [provider]  finasteride (PROSCAR) 5 MG tablet TAKE 1 TABLET(5 MG) BY MOUTH DAILY 11/21/19   McGowan, Carollee Herter A, PA-C  Multiple Vitamins-Minerals (CENTRUM SILVER ULTRA MENS) TABS Take 1 each by mouth  daily.    [provider]  omeprazole (PRILOSEC) 20 MG capsule omeprazole 20 mg capsule,delayed release    [provider]  potassium chloride (KLOR-CON) 10 MEQ tablet potassium chloride ER 10 mEq tablet,extended release    [provider]  tamsulosin (FLOMAX) 0.4 MG CAPS capsule TAKE 1 CAPSULE(0.4 MG) BY MOUTH AT BEDTIME 11/21/19   McGowan, Carollee Herter A, PA-C  torsemide (DEMADEX) 5 MG tablet torsemide 5 mg tablet 07/05/18   Vanna Scotland, MD    VITAL SIGNS:  Blood pressure (!) 149/71, pulse 68, temperature 98.2 F (36.8 C), temperature source Oral, resp. rate 16, height 6\' 2"  (1.88 m), weight 122.5 kg, SpO2 100 %. PHYSICAL EXAMINATION:  Physical Exam HENT:     Head: Normocephalic and atraumatic.  Eyes:     Conjunctiva/sclera: Conjunctivae normal.     Pupils: Pupils are equal, round, and reactive to light.  Neck:     Thyroid: No thyromegaly.     Trachea: No tracheal deviation.  Cardiovascular:     Rate and Rhythm: Normal rate and regular rhythm.     Heart sounds: Normal heart sounds.  Pulmonary:     Effort: Pulmonary effort is normal. No respiratory distress.     Breath sounds: Normal breath sounds. No wheezing.  Chest:     Chest wall: No tenderness.  Abdominal:     General: Bowel sounds are normal. There is no distension.     Palpations: Abdomen is soft.     Tenderness: There is no abdominal tenderness.  Musculoskeletal:        General: Normal range of motion.     Cervical back: Normal range of motion and neck supple.       Feet:  Feet:     Right foot:     Skin integrity: Ulcer present.     Toenail Condition: Right toenails are abnormally thick.     Comments: 2.5 cm x 1.5 cm x 0.3 cm ulcer present on the right heel Skin:    General: Skin is warm and dry.     Findings: No rash.  Neurological:     Mental Status: He is alert and oriented to person, place, and time.     Cranial Nerves: No cranial nerve deficit.    LABORATORY PANEL:    CBC Recent Labs  Lab 12/07/19 1413  WBC 13.5*  HGB 13.5  HCT 39.9  PLT 341   ------------------------------------------------------------------------------------------------------------------  Chemistries  Recent Labs  Lab 12/07/19 1413  NA 138  K 4.5  CL 101  CO2 28  GLUCOSE 99  BUN 24*  CREATININE 1.47*  CALCIUM 8.9  AST 22  ALT 22  ALKPHOS 60  BILITOT 1.8*   ------------------------------------------------------------------------------------------------------------------  Cardiac Enzymes No results for input(s): TROPONINI in the last 168 hours. ------------------------------------------------------------------------------------------------------------------  RADIOLOGY:  No results found.  IMPRESSION AND PLAN:  86 year old male being admitted for right heel ulcer, infection  *Right heel ulcer -MRI obtained in the emergency department not showing any osteomyelitis -Start IV vancomycin and meropenem for cellulitis as per order set -Recent excisional debridement done at his podiatry office on 12/01/2019 -Discussed with Dr. Caroline More who will see him while here.  Possible debridement coming Friday.  *Peripheral vascular disease -We will consult vascular surgery  *Acute kidney injury Likely prerenal.  We will start him on IV fluids and hold torsemide  * Leukocytosis Likely due to he will also infection.  Continue antibiotics and monitor white count    All the records are reviewed and case discussed with ED provider. Management plans discussed with the patient, family (brother at bedside) and they are in agreement.  CODE STATUS: Full code  TOTAL TIME TAKING CARE OF THIS PATIENT: 45 minutes.    Max Sane M.D on 12/07/2019 at [place] PM  [facility]   CC: Primary care physician; Burnard Hawthorne, FNP   Note: This dictation was prepared with Dragon dictation along with smaller phrase technology. Any transcriptional errors that result from  this process are unintentional.

## 2019-12-07 NOTE — ED Notes (Signed)
Called dietary to bring pt dinner tray

## 2019-12-07 NOTE — ED Notes (Signed)
Pt on phone with MRI to do screening questions 

## 2019-12-07 NOTE — ED Notes (Signed)
Pt transported to MRI 

## 2019-12-07 NOTE — ED Notes (Signed)
Report given to Gracie, RN 

## 2019-12-08 ENCOUNTER — Encounter: Payer: Self-pay | Admitting: Internal Medicine

## 2019-12-08 LAB — SURGICAL PCR SCREEN
MRSA, PCR: NEGATIVE
Staphylococcus aureus: NEGATIVE

## 2019-12-08 MED ORDER — ENSURE PRE-SURGERY PO LIQD
296.0000 mL | Freq: Once | ORAL | Status: AC
  Administered 2019-12-08: 14:00:00 296 mL via ORAL
  Filled 2019-12-08: qty 296

## 2019-12-08 MED ORDER — LINEZOLID 600 MG/300ML IV SOLN
600.0000 mg | Freq: Two times a day (BID) | INTRAVENOUS | Status: DC
  Administered 2019-12-08 – 2019-12-11 (×7): 600 mg via INTRAVENOUS
  Filled 2019-12-08 (×9): qty 300

## 2019-12-08 MED ORDER — MUPIROCIN 2 % EX OINT
1.0000 "application " | TOPICAL_OINTMENT | Freq: Two times a day (BID) | CUTANEOUS | Status: DC
  Filled 2019-12-08: qty 22

## 2019-12-08 MED ORDER — TAMSULOSIN HCL 0.4 MG PO CAPS
0.4000 mg | ORAL_CAPSULE | Freq: Every day | ORAL | Status: DC
  Administered 2019-12-09 – 2019-12-12 (×4): 0.4 mg via ORAL
  Filled 2019-12-08 (×4): qty 1

## 2019-12-08 MED ORDER — CHLORHEXIDINE GLUCONATE 4 % EX LIQD
60.0000 mL | Freq: Once | CUTANEOUS | Status: AC
  Administered 2019-12-08: 4 via TOPICAL

## 2019-12-08 MED ORDER — SODIUM CHLORIDE 0.9 % IV SOLN
2.0000 g | INTRAVENOUS | Status: DC
  Administered 2019-12-08 – 2019-12-11 (×4): 2 g via INTRAVENOUS
  Filled 2019-12-08: qty 20
  Filled 2019-12-08 (×2): qty 2
  Filled 2019-12-08: qty 20
  Filled 2019-12-08: qty 2

## 2019-12-08 NOTE — Progress Notes (Signed)
1        [place] at [facility]   PATIENT NAME: Terry Vaughn    MR#:  [id number]  DATE OF BIRTH:  05/12/1933  SUBJECTIVE:  CHIEF COMPLAINT:   Chief Complaint  Patient presents with  . Wound Infection  Would like his Flomax to be in the evening and diet to be changed to regular diet heart healthy.  Very appreciative of his care REVIEW OF SYSTEMS:  Review of Systems  Constitutional: Negative for diaphoresis, fever, malaise/fatigue and weight loss.  HENT: Negative for ear discharge, ear pain, hearing loss, nosebleeds, sore throat and tinnitus.   Eyes: Negative for blurred vision and pain.  Respiratory: Negative for cough, hemoptysis, shortness of breath and wheezing.   Cardiovascular: Negative for chest pain, palpitations, orthopnea and leg swelling.  Gastrointestinal: Negative for abdominal pain, blood in stool, constipation, diarrhea, heartburn, nausea and vomiting.  Genitourinary: Negative for dysuria, frequency and urgency.  Musculoskeletal: Negative for back pain and myalgias.  Skin: Positive for rash. Negative for itching.  Neurological: Negative for dizziness, tingling, tremors, focal weakness, seizures, weakness and headaches.  Psychiatric/Behavioral: Negative for depression. The patient is not nervous/anxious.    DRUG ALLERGIES:   Allergies  Allergen Reactions  . Codeine Other (See Comments)    Alters mental state   VITALS:  Blood pressure (!) 145/74, pulse (!) 57, temperature 98.6 F (37 C), resp. rate 18, height 6\' 2"  (1.88 m), weight 122.5 kg, SpO2 99 %. PHYSICAL EXAMINATION:  Physical Exam HENT:     Head: Normocephalic and atraumatic.  Eyes:     Conjunctiva/sclera: Conjunctivae normal.     Pupils: Pupils are equal, round, and reactive to light.  Neck:     Thyroid: No thyromegaly.     Trachea: No tracheal deviation.  Cardiovascular:     Rate and Rhythm: Normal rate and regular rhythm.     Heart sounds: Normal heart sounds.  Pulmonary:   Effort: Pulmonary effort is normal. No respiratory distress.     Breath sounds: Normal breath sounds. No wheezing.  Chest:     Chest wall: No tenderness.  Abdominal:     General: Bowel sounds are normal. There is no distension.     Palpations: Abdomen is soft.     Tenderness: There is no abdominal tenderness.  Musculoskeletal:        General: Normal range of motion.     Cervical back: Normal range of motion and neck supple.  Feet:     Right foot:     Skin integrity: Ulcer present.     Comments: Right heel dressing present Skin:    General: Skin is warm and dry.     Findings: No rash.  Neurological:     Mental Status: He is alert and oriented to person, place, and time.     Cranial Nerves: No cranial nerve deficit.    LABORATORY PANEL:  Male CBC Recent Labs  Lab 12/07/19 1413  WBC 13.5*  HGB 13.5  HCT 39.9  PLT 341   ------------------------------------------------------------------------------------------------------------------ Chemistries  Recent Labs  Lab 12/07/19 1413  NA 138  K 4.5  CL 101  CO2 28  GLUCOSE 99  BUN 24*  CREATININE 1.45*  1.47*  CALCIUM 8.9  AST 22  ALT 22  ALKPHOS 60  BILITOT 1.8*   RADIOLOGY:  MR FOOT RIGHT WO CONTRAST  Result Date: 12/07/2019 CLINICAL DATA:  Right heel ulcer EXAM: MRI OF THE RIGHT HINDFOOT WITHOUT CONTRAST TECHNIQUE: Multiplanar, multisequence MR imaging of  the ankle/hindfoot was performed. No intravenous contrast was administered. COMPARISON:  X-ray 11/07/2019 FINDINGS: Plantar soft tissue ulceration of the right heel underlying the posterior calcaneus (series 8, image 11). There is underlying soft tissue edema without a discrete sinus tract or fluid collection extending to the underlying cortex. Preservation of the fatty bone marrow signal within the calcaneus without edema or cortical destruction to suggest osteomyelitis. TENDONS Peroneal: Intact peroneus longus and peroneus brevis tendons. Posteromedial: Distal  tibialis posterior tendon is thickened and intermediate in signal suggesting tendinosis. Flexor digitorum longus and flexor hallucis longus tendons intact. No tenosynovitis. Anterior: Intact tibialis anterior, extensor hallucis longus and extensor digitorum longus tendons. Achilles: Thickening of the distal Achilles tendon measuring 16 mm in AP dimension with intermediate intrasubstance signal and a moderate partial-thickness insertional tear which measures 2.2 cm in transverse dimension (series 5, image 15; series 8, images 8-11). There are multiple ossifications within the distal tendon. No full-thickness Achilles tendon tear. No retrocalcaneal bursitis or peritendinous edema. Plantar Fascia: Fusiform thickening of the central band of the plantar fascia proximally. LIGAMENTS Lateral: The anterior and posterior tibiofibular ligaments are intact. The anterior and posterior talofibular ligaments are intact. Intact calcaneofibular ligament. Medial: Intact. CARTILAGE Ankle Joint: No joint effusion or chondral defect. Subtalar Joints/Sinus Tarsi: No joint effusion or chondral defect. Preservation of the anatomic fat within the sinus tarsi. Bones: No acute fracture. No dislocation. Degenerative subchondral marrow changes at the first tarsometatarsal joint. No evidence of osteomyelitis. Other: Diffuse subcutaneous edema. No organized fluid collection. IMPRESSION: 1. Negative for acute osteomyelitis. 2. Soft tissue ulceration and cellulitis of the right heel underlying the posterior calcaneus without discrete sinus tract or fluid collection extending to the underlying cortex. No abnormal marrow signal within the calcaneus. 3. Distal Achilles tendinosis with moderate partial-thickness insertional tear. 4. Distal tibialis posterior tendinosis. 5. Findings suggesting chronic plantar fasciitis. Electronically Signed   By: Davina Poke D.O.   On: 12/07/2019 16:15   ASSESSMENT AND PLAN:  86 year old male being admitted  for right heel ulcer, infection  *Right heel ulcer - large ulceration that probes fairly deep to the plantar fascia and subcutaneous tissue. Fairly close to bone overall per podiatry evaluation -MRI obtained in the emergency department not showing any osteomyelitis -Switch to intravenous Zyvox and Rocephin -Recent excisional debridement done at his podiatry office on 12/01/2019 -100% excisional wound debridement performed on 5/12 at bedside by Dr. Luana Shu -Wound culture growing gram negative rods and gram-positive cocci -Dressed in place -Patient to be nonweightbearing at all times to the right lower extremity per podiatry - Right heel wound debridement including all nonviable necrotic tissues to the level of bone with possible bone biopsy/culture, possible antibiotic bead placement planned by podiatry for 5/14  *Peripheral vascular disease -Pending vascular surgery consultation  *Acute kidney injury Likely prerenal.    Continue IV fluids and hold torsemide  * Leukocytosis Likely due to he will also infection.  Continue antibiotics and monitor white count     Status is: Inpatient  Remains inpatient appropriate because:Ongoing diagnostic testing needed not appropriate for outpatient work up   Dispo: The patient is from: Home              Anticipated d/c is to: Home              Anticipated d/c date is: 2 days              Patient currently is not medically stable to d/c.  Podiatry planning procedure on 5/14  DVT prophylaxis: Heparin Family Communication: discussed with patient   All the records are reviewed and case discussed with Care Management/Social Worker. Management plans discussed with the patient, nursing and they are in agreement.  CODE STATUS: Full Code  TOTAL TIME TAKING CARE OF THIS PATIENT: 35 minutes.   More than 50% of the time was spent in counseling/coordination of care: YES  POSSIBLE D/C IN 2-3 DAYS, DEPENDING ON CLINICAL CONDITION.   Delfino Lovett  M.D on 12/08/2019 at 11:51 AM  [facility]   CC: Primary care physician; Allegra Grana, FNP  Note: This dictation was prepared with Dragon dictation along with smaller phrase technology. Any transcriptional errors that result from this process are unintentional.

## 2019-12-08 NOTE — Evaluation (Signed)
Physical Therapy Evaluation Patient Details Name: Terry Vaughn MRN: [id number] DOB: [birth year] Today's Date: 12/08/2019   History of Present Illness  Pt admitted for cellultis of R foot due to chronic ulceration to plantar aspect of R heel. Per MD, continue NWB at this time with possible surgical intervention next date.  Clinical Impression  Pt is a pleasant 86 year old male who was admitted for R foot cellulitis, negative for osteo. Pt performs bed mobility with cga, transfers with mod asisst, and ambulation with mod assist and RW. Educated and able to maintain correct WBing precautions, however is very unsteady and needs physical assist to prevent falls. Pt demonstrates deficits with strength/pain/mobility. Pt is currently not at baseline level.  Would benefit from skilled PT to address above deficits and promote optimal return to PLOF; recommend transition to STR upon discharge from acute hospitalization.     Follow Up Recommendations SNF    Equipment Recommendations  Wheelchair (measurements PT)    Recommendations for Other Services       Precautions / Restrictions Precautions Precautions: Fall Restrictions Weight Bearing Restrictions: Yes RLE Weight Bearing: Non weight bearing      Mobility  Bed Mobility Overal bed mobility: Needs Assistance Bed Mobility: Supine to Sit     Supine to sit: Min guard     General bed mobility comments: follows commands well, able to sit at EOB with extended time. Able to keep R LE NWB  Transfers Overall transfer level: Needs assistance Equipment used: Rolling walker (2 wheeled) Transfers: Sit to/from Stand Sit to Stand: Mod assist         General transfer comment: first attempt, pt unable to fully stand and needs heavy cues for hand placement. Therapist demonstrated technique prior to performance. On second attempt, able to stand with upright posture  Ambulation/Gait Ambulation/Gait assistance: Mod assist Gait Distance (Feet): 6  Feet Assistive device: Rolling walker (2 wheeled) Gait Pattern/deviations: Step-to pattern     General Gait Details: ambulated with combo of hopping and pivoting on L foot over to chair and back to bed. Pt fatigues very quickly and reports increased pain with activity. Several LOB noted with assist needed to prevent falls.  Stairs            Wheelchair Mobility    Modified Rankin (Stroke Patients Only)       Balance Overall balance assessment: Needs assistance Sitting-balance support: Feet supported Sitting balance-Leahy Scale: Good     Standing balance support: Bilateral upper extremity supported Standing balance-Leahy Scale: Poor                               Pertinent Vitals/Pain Pain Assessment: 0-10 Pain Score: 5  Pain Location: R foot Pain Descriptors / Indicators: Discomfort;Guarding;Grimacing Pain Intervention(s): Limited activity within patient's tolerance;Repositioned    Home Living Family/patient expects to be discharged to:: Private residence Living Arrangements: Spouse/significant other Available Help at Discharge: Family(he is primary caregiver for his wife) Type of Home: House Home Access: Stairs to enter Entrance Stairs-Rails: Left;Right(wide) Technical brewer of Steps: 2 Home Layout: One level Home Equipment: Environmental consultant - 2 wheels;Bedside commode;Transport chair      Prior Function Level of Independence: Independent         Comments: was indep prior to admission. He is primary caregiver for wife. Reports no history of recent falls     Hand Dominance        Extremity/Trunk Assessment   Upper  Extremity Assessment Upper Extremity Assessment: Overall WFL for tasks assessed    Lower Extremity Assessment Lower Extremity Assessment: Generalized weakness(B LE grossly 4/5)       Communication   Communication: No difficulties  Cognition Arousal/Alertness: Awake/alert Behavior During Therapy: WFL for tasks  assessed/performed Overall Cognitive Status: Within Functional Limits for tasks assessed                                        General Comments      Exercises Other Exercises Other Exercises: discussed extensively on need for continued therapy and importance of maintaining NWB. Able to sit at EOB and work on seated balance prior to performing activities.   Assessment/Plan    PT Assessment Patient needs continued PT services  PT Problem List Decreased strength;Decreased activity tolerance;Decreased balance;Decreased mobility;Decreased safety awareness;Pain       PT Treatment Interventions Gait training;DME instruction;Therapeutic exercise;Balance training    PT Goals (Current goals can be found in the Care Plan section)  Acute Rehab PT Goals Patient Stated Goal: to go home PT Goal Formulation: With patient Time For Goal Achievement: 12/22/19 Potential to Achieve Goals: Good    Frequency Min 2X/week   Barriers to discharge        Co-evaluation               AM-PAC PT "6 Clicks" Mobility  Outcome Measure Help needed turning from your back to your side while in a flat bed without using bedrails?: A Little Help needed moving from lying on your back to sitting on the side of a flat bed without using bedrails?: A Little Help needed moving to and from a bed to a chair (including a wheelchair)?: A Lot Help needed standing up from a chair using your arms (e.g., wheelchair or bedside chair)?: A Lot Help needed to walk in hospital room?: Total Help needed climbing 3-5 steps with a railing? : Total 6 Click Score: 12    End of Session Equipment Utilized During Treatment: Gait belt Activity Tolerance: Patient limited by pain;Patient limited by fatigue Patient left: in bed;with bed alarm set Nurse Communication: Mobility status PT Visit Diagnosis: Muscle weakness (generalized) (M62.81);Unsteadiness on feet (R26.81);Difficulty in walking, not elsewhere  classified (R26.2);Pain Pain - Right/Left: Right Pain - part of body: Ankle and joints of foot    Time: 5631-4970 PT Time Calculation (min) (ACUTE ONLY): 23 min   Charges:   PT Evaluation $PT Eval Moderate Complexity: 1 Mod PT Treatments $Therapeutic Activity: 8-22 mins        Terry Vaughn, PT, DPT [phone]   Terry Vaughn 12/08/2019, 1:50 PM

## 2019-12-08 NOTE — Progress Notes (Signed)
Terry Vaughn, Terry Vaughn ([id number]) Visit Report for 12/07/2019 Abuse/Suicide Risk Screen Details Patient Name: Terry Vaughn, Terry Vaughn. Date of Service: 12/07/2019 1:00 PM Medical Record Number: [id number] Patient Account Number: [id number] Date of Birth/Sex: June 11, 1933 (86 y.o. M) Treating RN: Army Melia Primary Care Laraya Pestka: Mable Paris Other Clinician: Referring Icela Glymph: Boneta Lucks Treating Kimmberly Wisser/Extender: Tito Dine in Treatment: 0 Abuse/Suicide Risk Screen Items Answer ABUSE RISK SCREEN: Has anyone close to you tried to hurt or harm you recentlyo No Do you feel uncomfortable with anyone in your familyo No Has anyone forced you do things that you didnot want to doo No Electronic Signature(s) Signed: 12/08/2019 11:19:24 AM By: Army Melia Entered By: Army Melia on 12/07/2019 13:17:39 Terry Vaughn ([id number]) -------------------------------------------------------------------------------- Activities of Daily Living Details Patient Name: Terry Vaughn, Terry Vaughn. Date of Service: 12/07/2019 1:00 PM Medical Record Number: [id number] Patient Account Number: [id number] Date of Birth/Sex: September 16, 1932 (86 y.o. M) Treating RN: Army Melia Primary Care Khalik Pewitt: Mable Paris Other Clinician: Referring Amayah Staheli: Boneta Lucks Treating Alexas Basulto/Extender: Tito Dine in Treatment: 0 Activities of Daily Living Items Answer Activities of Daily Living (Please select one for each item) Drive Automobile Not Able Take Medications Completely Able Use Telephone Completely Able Care for Appearance Completely Able Use Toilet Completely Able Bath / Shower Completely Able Dress Self Completely Able Feed Self Completely Able Walk Completely Able Get In / Out Bed Completely Able Housework Completely Able Prepare Meals Completely [place] for Self Completely Able Electronic Signature(s) Signed: 12/08/2019 11:19:24 AM By: Army Melia Entered  By: Army Melia on 12/07/2019 13:17:48 Terry Vaughn ([id number]) -------------------------------------------------------------------------------- Education Screening Details Patient Name: Terry Vaughn, Terry Vaughn. Date of Service: 12/07/2019 1:00 PM Medical Record Number: [id number] Patient Account Number: [id number] Date of Birth/Sex: Jul 10, 1933 (86 y.o. M) Treating RN: Army Melia Primary Care Rachit Grim: Mable Paris Other Clinician: Referring Rakeb Kibble: Boneta Lucks Treating Ryen Heitmeyer/Extender: Tito Dine in Treatment: 0 Learning Preferences/Education Level/Primary Language Learning Preference: Explanation Highest Education Level: College or Above Preferred Language: English Cognitive Barrier Language Barrier: No Translator Needed: No Memory Deficit: No Emotional Barrier: No Cultural/Religious Beliefs Affecting Medical Care: No Physical Barrier Impaired Vision: No Impaired Hearing: No Decreased Hand dexterity: No Knowledge/Comprehension Knowledge Level: High Comprehension Level: High Ability to understand written instructions: High Ability to understand verbal instructions: High Motivation Anxiety Level: Calm Cooperation: Cooperative Education Importance: Acknowledges Need Interest in Health Problems: Asks Questions Perception: Coherent Willingness to Engage in Self-Management High Activities: Readiness to Engage in Self-Management High Activities: Electronic Signature(s) Signed: 12/08/2019 11:19:24 AM By: Army Melia Entered By: Army Melia on 12/07/2019 13:19:32 Terry Vaughn ([id number]) -------------------------------------------------------------------------------- Fall Risk Assessment Details Patient Name: Terry Vaughn. Date of Service: 12/07/2019 1:00 PM Medical Record Number: [id number] Patient Account Number: [id number] Date of Birth/Sex: 05-30-1933 (86 y.o. M) Treating RN: Army Melia Primary Care Ryley Bachtel: Mable Paris Other  Clinician: Referring Bennie Chirico: Boneta Lucks Treating Laylonie Marzec/Extender: Tito Dine in Treatment: 0 Fall Risk Assessment Items Have you had 2 or more falls in the last 12 monthso 0 No Have you had any fall that resulted in injury in the last 12 monthso 0 No FALLS RISK SCREEN History of falling - immediate or within 3 months 0 No Secondary diagnosis (Do you have 2 or more medical diagnoseso) 0 No Ambulatory aid None/bed rest/wheelchair/nurse 0 No Crutches/cane/walker 15 Yes Furniture 0 No Intravenous therapy Access/Saline/Heparin Lock 0 No Gait/Transferring Normal/ bed rest/ wheelchair 0 No Weak (short steps with or without shuffle,  stooped but able to lift head while walking, may 0 No seek support from furniture) Impaired (short steps with shuffle, may have difficulty arising from chair, head down, impaired 0 No balance) Mental Status Oriented to own ability 0 No Electronic Signature(s) Signed: 12/08/2019 11:19:24 AM By: Rodell Perna Entered By: Rodell Perna on 12/07/2019 13:20:05 Terry Vaughn ([id number]) -------------------------------------------------------------------------------- Foot Assessment Details Patient Name: Terry Vaughn, Terry Vaughn. Date of Service: 12/07/2019 1:00 PM Medical Record Number: [id number] Patient Account Number: [id number] Date of Birth/Sex: Feb 22, 1933 (86 y.o. M) Treating RN: Rodell Perna Primary Care Tyshawna Alarid: Rennie Plowman Other Clinician: Referring Fountain Derusha: Nicholes Rough Treating Evlyn Amason/Extender: Altamese [place] in Treatment: 0 Foot Assessment Items Site Locations + = Sensation present, - = Sensation absent, C = Callus, U = Ulcer R = Redness, W = Warmth, M = Maceration, PU = Pre-ulcerative lesion F = Fissure, S = Swelling, D = Dryness Assessment Right: Left: Other Deformity: No No Prior Foot Ulcer: No No Prior Amputation: No No Charcot Joint: No No Ambulatory Status: Ambulatory With Help Assistance Device: Cane Gait:  Steady Electronic Signature(s) Signed: 12/08/2019 11:19:24 AM By: Rodell Perna Entered By: Rodell Perna on 12/07/2019 13:20:45 Terry Vaughn ([id number]) -------------------------------------------------------------------------------- Nutrition Risk Screening Details Patient Name: Terry Vaughn, Terry Vaughn. Date of Service: 12/07/2019 1:00 PM Medical Record Number: [id number] Patient Account Number: [id number] Date of Birth/Sex: [birth year] (86 y.o. M) Treating RN: Rodell Perna Primary Care [place] Urieta: Rennie Plowman Other Clinician: Referring Cavon Nicolls: Nicholes Rough Treating Emmabelle Fear/Extender: Altamese [place] in Treatment: 0 Height (in): 74 Weight (lbs): 254 Body Mass Index (BMI): 32.6 Nutrition Risk Screening Items Score Screening NUTRITION RISK SCREEN: I have an illness or condition that made me change the kind and/or amount of food I eat 0 No I eat fewer than two meals per day 0 No I eat few fruits and vegetables, or milk products 0 No I have three or more drinks of beer, liquor or wine almost every day 0 No I have tooth or mouth problems that make it hard for me to eat 0 No I don't always have enough money to buy the food I need 0 No I eat alone most of the time 0 No I take three or more different prescribed or over-the-counter drugs a day 0 No Without wanting to, I have lost or gained 10 pounds in the last six months 0 No I am not always physically able to shop, cook and/or feed myself 0 No Nutrition Protocols Good Risk Protocol 0 No interventions needed Moderate Risk Protocol High Risk Proctocol Risk Level: Good Risk Score: 0 Electronic Signature(s) Signed: 12/08/2019 11:19:24 AM By: Rodell Perna Entered By: Rodell Perna on 12/07/2019 13:20:15

## 2019-12-09 ENCOUNTER — Inpatient Hospital Stay: Payer: Medicare Other | Admitting: Anesthesiology

## 2019-12-09 ENCOUNTER — Encounter: Payer: Self-pay | Admitting: Internal Medicine

## 2019-12-09 ENCOUNTER — Inpatient Hospital Stay: Payer: Medicare Other

## 2019-12-09 ENCOUNTER — Encounter
Admission: EM | Disposition: A | Discharge status: Skilled Nursing Facility | Payer: Self-pay | Source: Ambulatory Visit | Attending: Internal Medicine

## 2019-12-09 HISTORY — PX: IRRIGATION AND DEBRIDEMENT FOOT: SHX6602

## 2019-12-09 LAB — CBC
HCT: 39.7 % (ref 39.0–52.0)
Hemoglobin: 13.3 g/dL (ref 13.0–17.0)
MCH: 32.3 pg (ref 26.0–34.0)
MCHC: 33.5 g/dL (ref 30.0–36.0)
MCV: 96.4 fL (ref 80.0–100.0)
Platelets: 314 10*3/uL (ref 150–400)
RBC: 4.12 MIL/uL — ABNORMAL LOW (ref 4.22–5.81)
RDW: 13.2 % (ref 11.5–15.5)
WBC: 9.3 10*3/uL (ref 4.0–10.5)
nRBC: 0 % (ref 0.0–0.2)

## 2019-12-09 LAB — BASIC METABOLIC PANEL
Anion gap: 9 (ref 5–15)
BUN: 18 mg/dL (ref 8–23)
CO2: 27 mmol/L (ref 22–32)
Calcium: 8.4 mg/dL — ABNORMAL LOW (ref 8.9–10.3)
Chloride: 104 mmol/L (ref 98–111)
Creatinine, Ser: 1.1 mg/dL (ref 0.61–1.24)
GFR calc Af Amer: >60 mL/min (ref >60)
GFR calc non Af Amer: >60 mL/min (ref >60)
Glucose, Bld: 104 mg/dL — ABNORMAL HIGH (ref 70–99)
Potassium: 3.6 mmol/L (ref 3.5–5.1)
Sodium: 140 mmol/L (ref 135–145)

## 2019-12-09 SURGERY — IRRIGATION AND DEBRIDEMENT FOOT
Anesthesia: General | Site: Foot | Laterality: Right

## 2019-12-09 MED ORDER — SODIUM CHLORIDE 0.9 % IR SOLN
Status: DC | PRN
  Administered 2019-12-09: 3000 mL

## 2019-12-09 MED ORDER — ONDANSETRON HCL 4 MG/2ML IJ SOLN
INTRAMUSCULAR | Status: AC
  Filled 2019-12-09: qty 2

## 2019-12-09 MED ORDER — ONDANSETRON HCL 4 MG/2ML IJ SOLN
INTRAMUSCULAR | Status: DC | PRN
  Administered 2019-12-09: 4 mg via INTRAVENOUS

## 2019-12-09 MED ORDER — DEXAMETHASONE SODIUM PHOSPHATE 10 MG/ML IJ SOLN
INTRAMUSCULAR | Status: AC
  Filled 2019-12-09: qty 1

## 2019-12-09 MED ORDER — VANCOMYCIN HCL 1000 MG IV SOLR
INTRAVENOUS | Status: DC | PRN
  Administered 2019-12-09: 1000 mg

## 2019-12-09 MED ORDER — FENTANYL CITRATE (PF) 100 MCG/2ML IJ SOLN
INTRAMUSCULAR | Status: DC | PRN
  Administered 2019-12-09: 25 ug via INTRAVENOUS
  Administered 2019-12-09: 50 ug via INTRAVENOUS

## 2019-12-09 MED ORDER — VANCOMYCIN HCL 1000 MG IV SOLR
INTRAVENOUS | Status: AC
  Filled 2019-12-09: qty 1000

## 2019-12-09 MED ORDER — PHENYLEPHRINE HCL (PRESSORS) 10 MG/ML IV SOLN
INTRAVENOUS | Status: DC | PRN
  Administered 2019-12-09 (×2): 100 ug via INTRAVENOUS
  Administered 2019-12-09: 200 ug via INTRAVENOUS
  Administered 2019-12-09: 100 ug via INTRAVENOUS

## 2019-12-09 MED ORDER — BACITRACIN 50000 UNITS IM SOLR
INTRAMUSCULAR | Status: AC
  Filled 2019-12-09: qty 1

## 2019-12-09 MED ORDER — LIDOCAINE HCL (CARDIAC) PF 100 MG/5ML IV SOSY
PREFILLED_SYRINGE | INTRAVENOUS | Status: DC | PRN
  Administered 2019-12-09: 100 mg via INTRAVENOUS

## 2019-12-09 MED ORDER — PROPOFOL 10 MG/ML IV BOLUS
INTRAVENOUS | Status: AC
  Filled 2019-12-09: qty 20

## 2019-12-09 MED ORDER — SODIUM CHLORIDE FLUSH 0.9 % IV SOLN
INTRAVENOUS | Status: AC
  Filled 2019-12-09: qty 10

## 2019-12-09 MED ORDER — FENTANYL CITRATE (PF) 100 MCG/2ML IJ SOLN: 25.0000 ug | INTRAMUSCULAR | Status: DC | PRN

## 2019-12-09 MED ORDER — SODIUM CHLORIDE (PF) 0.9 % IJ SOLN
INTRAMUSCULAR | Status: AC
  Filled 2019-12-09: qty 10

## 2019-12-09 MED ORDER — DEXAMETHASONE SODIUM PHOSPHATE 10 MG/ML IJ SOLN
INTRAMUSCULAR | Status: DC | PRN
  Administered 2019-12-09: 8 mg via INTRAVENOUS

## 2019-12-09 MED ORDER — ACETAMINOPHEN 500 MG PO TABS
1000.0000 mg | ORAL_TABLET | Freq: Four times a day (QID) | ORAL | Status: DC | PRN
  Administered 2019-12-09: 23:00:00 1000 mg via ORAL
  Filled 2019-12-09: qty 2

## 2019-12-09 MED ORDER — LIDOCAINE HCL (PF) 2 % IJ SOLN
INTRAMUSCULAR | Status: AC
  Filled 2019-12-09: qty 10

## 2019-12-09 MED ORDER — SUGAMMADEX SODIUM 200 MG/2ML IV SOLN
INTRAVENOUS | Status: DC | PRN
  Administered 2019-12-09: 250 mg via INTRAVENOUS

## 2019-12-09 MED ORDER — LACTATED RINGERS IV SOLN: INTRAVENOUS | Status: DC | PRN

## 2019-12-09 MED ORDER — PROPOFOL 10 MG/ML IV BOLUS
INTRAVENOUS | Status: DC | PRN
  Administered 2019-12-09: 150 mg via INTRAVENOUS

## 2019-12-09 MED ORDER — ROCURONIUM BROMIDE 100 MG/10ML IV SOLN
INTRAVENOUS | Status: DC | PRN
  Administered 2019-12-09: 60 mg via INTRAVENOUS

## 2019-12-09 MED ORDER — FENTANYL CITRATE (PF) 100 MCG/2ML IJ SOLN
INTRAMUSCULAR | Status: AC
  Filled 2019-12-09: qty 2

## 2019-12-09 MED ORDER — EPHEDRINE 5 MG/ML INJ
INTRAVENOUS | Status: AC
  Filled 2019-12-09: qty 10

## 2019-12-09 MED ORDER — BUPIVACAINE HCL (PF) 0.5 % IJ SOLN
INTRAMUSCULAR | Status: DC | PRN
  Administered 2019-12-09: 20 mL

## 2019-12-09 MED ORDER — ROCURONIUM BROMIDE 10 MG/ML (PF) SYRINGE
PREFILLED_SYRINGE | INTRAVENOUS | Status: AC
  Filled 2019-12-09: qty 10

## 2019-12-09 MED ORDER — ONDANSETRON HCL 4 MG/2ML IJ SOLN: 4.0000 mg | Freq: Once | INTRAMUSCULAR | Status: DC | PRN

## 2019-12-09 SURGICAL SUPPLY — 65 items
BAG COUNTER SPONGE EZ (MISCELLANEOUS) ×2 IMPLANT
BAG SPNG 4X4 CLR HAZ (MISCELLANEOUS) ×1
BLADE OSC/SAGITTAL MD 5.5X18 (BLADE) IMPLANT
BLADE OSCILLATING/SAGITTAL (BLADE)
BLADE SW THK.38XMED LNG THN (BLADE) IMPLANT
BNDG CMPR STD VLCR NS LF 5.8X3 (GAUZE/BANDAGES/DRESSINGS) ×1
BNDG CMPR STD VLCR NS LF 5.8X4 (GAUZE/BANDAGES/DRESSINGS)
BNDG CONFORM 2 STRL LF (GAUZE/BANDAGES/DRESSINGS) IMPLANT
BNDG CONFORM 3 STRL LF (GAUZE/BANDAGES/DRESSINGS) IMPLANT
BNDG ELASTIC 3X5.8 VLCR NS LF (GAUZE/BANDAGES/DRESSINGS) ×2 IMPLANT
BNDG ELASTIC 4X5.8 VLCR NS LF (GAUZE/BANDAGES/DRESSINGS) IMPLANT
BNDG ESMARK 4X12 TAN STRL LF (GAUZE/BANDAGES/DRESSINGS) ×3 IMPLANT
BNDG GAUZE 4.5X4.1 6PLY STRL (MISCELLANEOUS) ×3 IMPLANT
CANISTER SUCT 1200ML W/VALVE (MISCELLANEOUS) ×3 IMPLANT
CANISTER SUCT 3000ML PPV (MISCELLANEOUS) ×2 IMPLANT
COUNTER SPONGE BAG EZ (MISCELLANEOUS) ×1
COVER WAND RF STERILE (DRAPES) ×3 IMPLANT
CUFF TOURN SGL QUICK 12 (TOURNIQUET CUFF) IMPLANT
CUFF TOURN SGL QUICK 18X4 (TOURNIQUET CUFF) ×2 IMPLANT
DRAPE FLUOR MINI C-ARM 54X84 (DRAPES) IMPLANT
DRSG EMULSION OIL 3X3 NADH (GAUZE/BANDAGES/DRESSINGS) ×2 IMPLANT
DURAPREP 26ML APPLICATOR (WOUND CARE) ×3 IMPLANT
ELECT REM PT RETURN 9FT ADLT (ELECTROSURGICAL) ×3
ELECTRODE REM PT RTRN 9FT ADLT (ELECTROSURGICAL) ×1 IMPLANT
GAUZE PACKING 1/4 X5 YD (GAUZE/BANDAGES/DRESSINGS) IMPLANT
GAUZE PACKING IODOFORM 1X5 (PACKING) IMPLANT
GAUZE SPONGE 4X4 12PLY STRL (GAUZE/BANDAGES/DRESSINGS) ×3 IMPLANT
GAUZE XEROFORM 1X8 LF (GAUZE/BANDAGES/DRESSINGS) ×3 IMPLANT
GLOVE BIO SURGEON STRL SZ7 (GLOVE) ×3 IMPLANT
GLOVE INDICATOR 7.0 STRL GRN (GLOVE) ×3 IMPLANT
GOWN STRL REUS W/ TWL LRG LVL3 (GOWN DISPOSABLE) ×2 IMPLANT
GOWN STRL REUS W/TWL LRG LVL3 (GOWN DISPOSABLE) ×6
HANDPIECE VERSAJET DEBRIDEMENT (MISCELLANEOUS) ×2 IMPLANT
IV NS 1000ML (IV SOLUTION) ×3
IV NS 1000ML BAXH (IV SOLUTION) IMPLANT
KIT STIMULAN RAPID CURE 5CC (Orthopedic Implant) ×2 IMPLANT
KIT TURNOVER KIT A (KITS) ×3 IMPLANT
LABEL OR SOLS (LABEL) IMPLANT
NDL FILTER BLUNT 18X1 1/2 (NEEDLE) ×1 IMPLANT
NDL HYPO 25X1 1.5 SAFETY (NEEDLE) ×2 IMPLANT
NEEDLE FILTER BLUNT 18X 1/2SAF (NEEDLE) ×2
NEEDLE FILTER BLUNT 18X1 1/2 (NEEDLE) ×1 IMPLANT
NEEDLE HYPO 25X1 1.5 SAFETY (NEEDLE) ×6 IMPLANT
NS IRRIG 500ML POUR BTL (IV SOLUTION) ×3 IMPLANT
PACK EXTREMITY (MISCELLANEOUS) ×3 IMPLANT
PAD ABD DERMACEA PRESS 5X9 (GAUZE/BANDAGES/DRESSINGS) ×3 IMPLANT
PULSAVAC PLUS IRRIG FAN TIP (DISPOSABLE) ×3
RASP SM TEAR CROSS CUT (RASP) IMPLANT
SOL .9 NS 3000ML IRR  AL (IV SOLUTION) ×3
SOL .9 NS 3000ML IRR AL (IV SOLUTION) ×1
SOL .9 NS 3000ML IRR UROMATIC (IV SOLUTION) IMPLANT
SOL PREP PVP 2OZ (MISCELLANEOUS)
SOLUTION PREP PVP 2OZ (MISCELLANEOUS) IMPLANT
STOCKINETTE STRL 6IN 960660 (GAUZE/BANDAGES/DRESSINGS) ×3 IMPLANT
SUT ETHILON 3-0 FS-10 30 BLK (SUTURE)
SUT ETHILON 4-0 (SUTURE)
SUT ETHILON 4-0 FS2 18XMFL BLK (SUTURE)
SUT VIC AB 3-0 SH 27 (SUTURE)
SUT VIC AB 3-0 SH 27X BRD (SUTURE) IMPLANT
SUT VIC AB 4-0 FS2 27 (SUTURE) IMPLANT
SUTURE EHLN 3-0 FS-10 30 BLK (SUTURE) IMPLANT
SUTURE ETHLN 4-0 FS2 18XMF BLK (SUTURE) IMPLANT
SWAB CULTURE AMIES ANAERIB BLU (MISCELLANEOUS) IMPLANT
SYR 10ML LL (SYRINGE) ×3 IMPLANT
TIP FAN IRRIG PULSAVAC PLUS (DISPOSABLE) IMPLANT

## 2019-12-09 NOTE — Op Note (Signed)
PODIATRY / FOOT AND ANKLE SURGERY OPERATIVE REPORT    SURGEON: Caroline More, DPM  PRE-OPERATIVE DIAGNOSIS:  1. Right heel wound to the level of plantar fascia/muscle (measures 2cm x 2cm x 1.5cm) 2. Cellulitis with abscess right heel 3. Neuropathy 4. Calcaneal gait with pes cavus foot type  POST-OPERATIVE DIAGNOSIS: same  PROCEDURE(S): 1. R plantar heel incision and drainage 2. R plantar heel 100% excisional wound debridement to the level of muscle/tendon 3. Application of antibiotic beads R plantar heel wound  HEMOSTASIS: no tourniquet  ANESTHESIA: MAC  ESTIMATED BLOOD LOSS: 50 cc  FINDING(S): 1.  R plantar heel abscess with wound probing to subQ and exposed plantar fascia/plantar foot musculature  PATHOLOGY/SPECIMEN(S):  None taken, previous culture taken at patient's bedside  INDICATIONS:   Terry Vaughn is a 86 y.o. male who presents with a nonhealing ulceration to the plantar aspect of the right heel.  Patient presented to [facility] after being seen at the wound care center and was noted to have cellulitic changes to the right heel with a wound that probes very close to bone.  Patient was seen in the ED and an MRI was performed which showed no obvious abscess or osteomyelitis present.  A wound debridement was performed bedside at that time to the level of plantar fascia and purulence was able to be expressed.  Discussed all treatment options with the patient both conservative and surgical attempts at correction: Potential risks and complications and at this time patient is elected for surgery consisting of right plantar heel incision and drainage with debridement of all nonviable necrotic tissues to the level of bone potentially.  Also discussed potential usage of antibiotic beads and bone biopsy if bone appears to be exposed.  All questions answered with patient and patient is ready to proceed with surgery.  DESCRIPTION: After obtaining full informed  written consent, the patient was brought back to the operating room and placed supine upon the operating table.  The patient received IV antibiotics prior to induction.  After obtaining adequate anesthesia, a heel block was performed with 20 cc of half percent Marcaine plain.  The patient was prepped and draped in the standard fashion.  A tourniquet was applied but not inflated during the case in case hemostasis became an issue.  Attention was directed to the plantar aspect of the right heel where a linear longitudinal incision was made over the area of the previous wound excising the wound removing all nonviable necrotic tissue to the level of plantar fascia.  There appeared to be a small area of purulent drainage within this layer between the subcutaneous tissue and plantar fascia which was drained and a portion of the plantar fascia was also resected revealing the underlying flexor digitorum brevis muscle belly at its insertion point to the calcaneus.  The periosteum appeared to be intact to the calcaneus with no disruption in the bone appeared to be hard and intact.  The area was further then debrided with the Versajet along with a 15 blade removing all nonviable necrotic tissue to the level of healthy bleeding tissue.  The surgical site was then flushed with 3 L of normal saline with bacitracin mix with the pulse lavage.  Antibiotic beads were then prepared with vancomycin infusion and put into the heel ulceration.  Portions of the incision were then reapproximated well coapted with 3-0 nylon.  A postoperative dressing was applied consisting of Adaptic, 4 x 4 gauze, ABD, Kerlix, Ace wrap with compression.  The patient tolerated the procedure and anesthesia well and was transferred to the recovery room vital signs stable vascular status still remaining intact to the right foot.  The patient will be discharged to the inpatient room and will continue to be followed up with until discharge with appropriate  follow-up recommendations.  Patient is to still remain nonweightbearing at all times to right lower extremity.  Likely tomorrow we will order a wound VAC that needs to be applied and change 3 times weekly.  If the tissues appear to be more healthy and viable then in outpatient setting we can apply skin grafts if covered by insurance.  COMPLICATIONS: None  CONDITION: Good, stable  Caroline More, DPM

## 2019-12-09 NOTE — Anesthesia Preprocedure Evaluation (Signed)
Anesthesia Evaluation  Patient identified by MRN, date of birth, ID band Patient awake    Reviewed: Allergy & Precautions, NPO status , Patient's Chart, lab work & pertinent test results  History of Anesthesia Complications Negative for: history of anesthetic complications  Airway Mallampati: II  TM Distance: >3 FB Neck ROM: Full    Dental  (+) Poor Dentition, Chipped   Pulmonary neg pulmonary ROS, neg sleep apnea, neg COPD,    breath sounds clear to auscultation- rhonchi (-) wheezing      Cardiovascular hypertension, + Peripheral Vascular Disease  (-) CAD, (-) Past MI, (-) Cardiac Stents and (-) CABG  Rhythm:Regular Rate:Normal - Systolic murmurs and - Diastolic murmurs    Neuro/Psych neg Seizures negative neurological ROS  negative psych ROS   GI/Hepatic negative GI ROS, Neg liver ROS,   Endo/Other  negative endocrine ROSneg diabetes  Renal/GU Renal InsufficiencyRenal disease     Musculoskeletal  (+) Arthritis ,   Abdominal (+) + obese,   Peds  Hematology negative hematology ROS (+)   Anesthesia Other Findings Past Medical History: 1988: Bone spur     Comment:  left heel 1987: Bursitis     Comment:  right shoulder 1986: Cellulitis     Comment:  right leg No date: Pneumonia     Comment:  noted 07/17/17 CXR  1959: Shingles No date: Urinary retention No date: UTI (urinary tract infection)     Comment:  sepsis E coli UTI +urine and blood cultures 07/17/17    Reproductive/Obstetrics                             Anesthesia Physical Anesthesia Plan  ASA: III  Anesthesia Plan: General   Post-op Pain Management:    Induction: Intravenous  PONV Risk Score and Plan: 1 and Ondansetron and Dexamethasone  Airway Management Planned: LMA  Additional Equipment:   Intra-op Plan:   Post-operative Plan:   Informed Consent: I have reviewed the patients History and Physical, chart,  labs and discussed the procedure including the risks, benefits and alternatives for the proposed anesthesia with the patient or authorized representative who has indicated his/her understanding and acceptance.     Dental advisory given  Plan Discussed with: CRNA and Anesthesiologist  Anesthesia Plan Comments:         Anesthesia Quick Evaluation

## 2019-12-09 NOTE — Care Management Important Message (Signed)
Important Message  Patient Details  Name: Terry Vaughn MRN: [id number] Date of Birth: Nov 18, 1932   Medicare Important Message Given:  Yes     [name] A [name] 12/09/2019, 11:31 AM

## 2019-12-09 NOTE — Transfer of Care (Signed)
Immediate Anesthesia Transfer of Care Note  Patient: Terry Vaughn  Procedure(s) Performed: IRRIGATION AND DEBRIDEMENT HEEL (Right Foot)  Patient Location: PACU  Anesthesia Type:General  Level of Consciousness: drowsy  Airway & Oxygen Therapy: Patient Spontanous Breathing and Patient connected to face mask oxygen  Post-op Assessment: Report given to RN and Post -op Vital signs reviewed and stable  Post vital signs: Reviewed and stable  Last Vitals:  Vitals Value Taken Time  BP 130/70 12/09/19 1333  Temp 36.2 C 12/09/19 1331  Pulse 68 12/09/19 1335  Resp 20 12/09/19 1335  SpO2 98 % 12/09/19 1335  Vitals shown include unvalidated device data.  Last Pain:  Vitals:   12/09/19 1331  TempSrc:   PainSc: (P) 0-No pain      Patients Stated Pain Goal: 0 (12/09/19 1114)  Complications: No apparent anesthesia complications

## 2019-12-09 NOTE — H&P (Signed)
HISTORY AND PHYSICAL INTERVAL NOTE:  12/09/2019  12:04 PM  Terry Vaughn  has presented today for surgery, with the diagnosis of infected heel.  The various methods of treatment have been discussed with the patient.  No guarantees were given.  After consideration of risks, benefits and other options for treatment, the patient has consented to surgery.  I have reviewed the patients' chart and labs.    PROCEDURE: LEFT HEEL INCISION AND DRAINAGE WITH DEBRIDEMENT OF ALL NONVIABLE AND NECROTIC TISSUE TO THE LEVEL OF BONE, POSSIBLE BONE BIOPSY AND ANTIBIOTIC BEAD APPLICATION   A history and physical examination was performed in my office.  The patient was reexamined.  There have been no changes to this history and physical examination.  Rosetta Posner, DPM

## 2019-12-09 NOTE — Progress Notes (Signed)
1        [facility] [place] NAME: Terry Vaughn    MR#:  [id number]  DATE OF BIRTH:  Jan 13, 1933  SUBJECTIVE:  CHIEF COMPLAINT:   Chief Complaint  Patient presents with  . Wound Infection  Requesting to go to rehabilitation at discharge.  Currently waiting for scheduled procedure, had some questions about skin grafting. REVIEW OF SYSTEMS:  Review of Systems  Constitutional: Negative for diaphoresis, fever, malaise/fatigue and weight loss.  HENT: Negative for ear discharge, ear pain, hearing loss, nosebleeds, sore throat and tinnitus.   Eyes: Negative for blurred vision and pain.  Respiratory: Negative for cough, hemoptysis, shortness of breath and wheezing.   Cardiovascular: Negative for chest pain, palpitations, orthopnea and leg swelling.  Gastrointestinal: Negative for abdominal pain, blood in stool, constipation, diarrhea, heartburn, nausea and vomiting.  Genitourinary: Negative for dysuria, frequency and urgency.  Musculoskeletal: Negative for back pain and myalgias.  Skin: Positive for rash. Negative for itching.  Neurological: Negative for dizziness, tingling, tremors, focal weakness, seizures, weakness and headaches.  Psychiatric/Behavioral: Negative for depression. The patient is not nervous/anxious.    DRUG ALLERGIES:   Allergies  Allergen Reactions  . Codeine Other (See Comments)    Alters mental state   VITALS:  Blood pressure (!) 151/73, pulse 63, temperature 97.9 F (36.6 C), temperature source Tympanic, resp. rate 20, height 6\' 2"  (1.88 m), weight 122.5 kg, SpO2 100 %. PHYSICAL EXAMINATION:  Physical Exam HENT:     Head: Normocephalic and atraumatic.  Eyes:     Conjunctiva/sclera: Conjunctivae normal.     Pupils: Pupils are equal, round, and reactive to light.  Neck:     Thyroid: No thyromegaly.     Trachea: No tracheal deviation.  Cardiovascular:     Rate and Rhythm: Normal rate and regular rhythm.     Heart sounds: Normal heart  sounds.  Pulmonary:     Effort: Pulmonary effort is normal. No respiratory distress.     Breath sounds: Normal breath sounds. No wheezing.  Chest:     Chest wall: No tenderness.  Abdominal:     General: Bowel sounds are normal. There is no distension.     Palpations: Abdomen is soft.     Tenderness: There is no abdominal tenderness.  Musculoskeletal:        General: Normal range of motion.     Cervical back: Normal range of motion and neck supple.  Feet:     Right foot:     Skin integrity: Ulcer present.     Comments: Right heel dressing present Skin:    General: Skin is warm and dry.     Findings: No rash.  Neurological:     Mental Status: He is alert and oriented to person, place, and time.     Cranial Nerves: No cranial nerve deficit.    LABORATORY PANEL:  Male CBC Recent Labs  Lab 12/09/19 0415  WBC 9.3  HGB 13.3  HCT 39.7  PLT 314   ------------------------------------------------------------------------------------------------------------------ Chemistries  Recent Labs  Lab 12/07/19 1413 12/07/19 1413 12/09/19 0415  NA 138   < > 140  K 4.5   < > 3.6  CL 101   < > 104  CO2 28   < > 27  GLUCOSE 99   < > 104*  BUN 24*   < > 18  CREATININE 1.45*  1.47*   < > 1.10  CALCIUM 8.9   < > 8.4*  AST  22  --   --   ALT 22  --   --   ALKPHOS 60  --   --   BILITOT 1.8*  --   --    < > = values in this interval not displayed.   RADIOLOGY:  No results found. ASSESSMENT AND PLAN:  86 year old male being admitted for right heel ulcer, infection  *Right heel ulcer - large ulceration that probes fairly deep to the plantar fascia and subcutaneous tissue. Fairly close to bone overall per podiatry evaluation -MRI not showing any osteomyelitis -Continue intravenous Zyvox and Rocephin -Recent excisional debridement done at his podiatry office on 12/01/2019 -100% excisional wound debridement performed on 5/12 at bedside by Dr. Excell Seltzer -Wound culture growing gram negative  rods and gram-positive cocci -Dressing in place -Patient to be nonweightbearing at all times to the right lower extremity per podiatry - Right heel wound debridement including all nonviable necrotic tissues to the level of bone with possible bone biopsy/culture, possible antibiotic bead placement planned by podiatry for today 5/14  *Peripheral vascular disease -No intervention planned  *Acute kidney injury Likely prerenal.  Improving with IV hydration  * Leukocytosis Likely due to he will also infection.  Continue antibiotics and monitor white count     Status is: Inpatient  Remains inpatient appropriate because:Ongoing diagnostic testing needed not appropriate for outpatient work up   Dispo: The patient is from: Home              Anticipated d/c is to: SNF              Anticipated d/c date is: 2 days              Patient currently is not medically stable to d/c.  Podiatry planning procedure on 5/14     DVT prophylaxis: Heparin Family Communication: discussed with patient   All the records are reviewed and case discussed with Care Management/Social Worker. Management plans discussed with the patient, nursing and they are in agreement.  CODE STATUS: Full Code  TOTAL TIME TAKING CARE OF THIS PATIENT: 35 minutes.   More than 50% of the time was spent in counseling/coordination of care: YES  POSSIBLE D/C IN 2-3 DAYS, DEPENDING ON CLINICAL CONDITION.   Delfino Lovett M.D on 12/09/2019 at 11:43 AM  [facility]   CC: Primary care physician; Allegra Grana, FNP  Note: This dictation was prepared with Dragon dictation along with smaller phrase technology. Any transcriptional errors that result from this process are unintentional.

## 2019-12-09 NOTE — Anesthesia Procedure Notes (Signed)
Procedure Name: Intubation Date/Time: 12/09/2019 12:24 PM Performed by: Lynden Oxford, CRNA Pre-anesthesia Checklist: Patient identified, Emergency Drugs available, Suction available and Patient being monitored Patient Re-evaluated:Patient Re-evaluated prior to induction Oxygen Delivery Method: Circle system utilized Preoxygenation: Pre-oxygenation with 100% oxygen Induction Type: IV induction Ventilation: Oral airway inserted - appropriate to patient size and Mask ventilation without difficulty Laryngoscope Size: McGraph and 4 Grade View: Grade I Tube type: Oral Tube size: 8.0 mm Number of attempts: 1 Airway Equipment and Method: Stylet and Oral airway Placement Confirmation: ETT inserted through vocal cords under direct vision,  positive ETCO2 and breath sounds checked- equal and bilateral Secured at: 23 cm Tube secured with: Tape Dental Injury: Teeth and Oropharynx as per pre-operative assessment

## 2019-12-09 NOTE — Anesthesia Postprocedure Evaluation (Signed)
Anesthesia Post Note  Patient: Terry Vaughn  Procedure(s) Performed: IRRIGATION AND DEBRIDEMENT HEEL (Right Foot)  Patient location during evaluation: PACU Anesthesia Type: General Level of consciousness: awake and alert and oriented Pain management: pain level controlled Vital Signs Assessment: post-procedure vital signs reviewed and stable Respiratory status: spontaneous breathing, nonlabored ventilation and respiratory function stable Cardiovascular status: blood pressure returned to baseline and stable Postop Assessment: no signs of nausea or vomiting Anesthetic complications: no     Last Vitals:  Vitals:   12/09/19 1345 12/09/19 1400  BP: 134/72 (!) 152/69  Pulse: 61 63  Resp: (!) 0 11  Temp:    SpO2: 96% 96%    Last Pain:  Vitals:   12/09/19 1400  TempSrc:   PainSc: 0-No pain                 Sakari Raisanen

## 2019-12-09 NOTE — Progress Notes (Signed)
[facility]  Communication Note  1) curbside consult for chronic left lower extremity heel wound 2) s/p left heel debridement by podiatry on Dec 09, 2019 3) last ABI conducted on November 25, 2019 with triphasic blood flow 4) last seen in our office for venous insufficiency and lower extremity edema on 03/10/17 5) our office will reach out to the patient and see him in our clinic in a week to discuss possible angiogram  Discussed with Dr. Wallis Mart Stegmayer PA-C 12/09/2019 3:31 PM

## 2019-12-09 NOTE — Progress Notes (Signed)
PT Cancellation Note  Patient Details Name: JAVARIAN JAKUBIAK MRN: [id number] DOB: June 02, 1933   Cancelled Treatment:     Pt is currently out of room for podiatry procedure. Unavailable for therapy at this time. Will re-attempt next available date.   Donis Pinder 12/09/2019, 11:31 AM Elizabeth Palau, PT, DPT [phone]

## 2019-12-10 MED ORDER — POLYETHYLENE GLYCOL 3350 17 G PO PACK
17.0000 g | PACK | Freq: Every day | ORAL | Status: DC
  Administered 2019-12-10 – 2019-12-12 (×3): 17 g via ORAL
  Filled 2019-12-10 (×3): qty 1

## 2019-12-10 NOTE — Plan of Care (Signed)
  Problem: Health Behavior/Discharge Planning: Goal: Ability to manage health-related needs will improve Outcome: Progressing   Problem: Clinical Measurements: Goal: Ability to maintain clinical measurements within normal limits will improve Outcome: Progressing Goal: Will remain free from infection Outcome: Progressing Goal: Diagnostic test results will improve Outcome: Progressing Goal: Respiratory complications will improve Outcome: Progressing Goal: Cardiovascular complication will be avoided Outcome: Progressing   Problem: Elimination: Goal: Will not experience complications related to bowel motility Outcome: Progressing Goal: Will not experience complications related to urinary retention Outcome: Progressing   Problem: Pain Managment: Goal: General experience of comfort will improve Outcome: Progressing   Problem: Safety: Goal: Ability to remain free from injury will improve Outcome: Progressing   Problem: Skin Integrity: Goal: Risk for impaired skin integrity will decrease Outcome: Progressing   

## 2019-12-10 NOTE — Progress Notes (Addendum)
PODIATRY / FOOT AND ANKLE SURGERY PROGRESS NOTE  Chief Complaint: Right heel wound   HPI: Terry Vaughn is a 86 y.o. male who presents with status post 1 day right heel incision and drainage with debridement of all nonviable and necrotic tissue to the level of tendon/fascia.  Patient doing well and had no overnight issues.  His dressing remains clean, dry, and intact today with no active drainage.  He states that he has had very minimal pain overall to the plantar aspect of the right heel since the procedure and is doing quite well.  Patient states that he is feeling much better since his admission and is really appreciative of his care.  PMHx:  Past Medical History:  Diagnosis Date  . Bone spur 1988   left heel  . Bursitis 1987   right shoulder  . Cellulitis 1986   right leg  . Pneumonia    noted 07/17/17 CXR   . Shingles 1959  . Urinary retention   . UTI (urinary tract infection)    sepsis E coli UTI +urine and blood cultures 07/17/17     Surgical Hx:  Past Surgical History:  Procedure Laterality Date  . APPENDECTOMY  1942  . CHOLECYSTECTOMY  1966  . EYE SURGERY  2015   catarcts extracted  . JOINT REPLACEMENT     right hip 20 + years from 2019, left hip replaced 05/01/17   . MELANOMA EXCISION  04.2010   mole removal right arm  . TONSILLECTOMY AND ADENOIDECTOMY  1944  . TOTAL HIP ARTHROPLASTY  1999   right hip    FHx:  Family History  Problem Relation Age of Onset  . Clotting disorder Mother   . Heart disease Father 42  . Cancer Sister        breast  . Cerebral aneurysm Brother        hemmorage  . Cancer Daughter        breast    Social History:  reports that he has never smoked. He has never used smokeless tobacco. He reports that he does not drink alcohol or use drugs.  Allergies:  Allergies  Allergen Reactions  . Codeine Other (See Comments)    Alters mental state    Review of Systems: General ROS: negative Respiratory ROS: no cough, shortness of  breath, or wheezing Cardiovascular ROS: no chest pain or dyspnea on exertion Gastrointestinal ROS: no abdominal pain, change in bowel habits, or black or bloody stools Musculoskeletal ROS: positive for - joint stiffness and joint swelling Neurological ROS: positive for - numbness/tingling Dermatological ROS: positive for Right plantar heel ulceration  Medications Prior to Admission  Medication Sig Dispense Refill  . acidophilus (RISAQUAD) CAPS capsule Take 1 capsule by mouth daily.    Marland Kitchen aspirin 81 MG chewable tablet Chew 81 mg by mouth daily.    Marland Kitchen docusate sodium (COLACE) 100 MG capsule Take 100 mg by mouth daily as needed for mild constipation.    . finasteride (PROSCAR) 5 MG tablet TAKE 1 TABLET(5 MG) BY MOUTH DAILY (Patient taking differently: Take 5 mg by mouth daily. ) 90 tablet 2  . tamsulosin (FLOMAX) 0.4 MG CAPS capsule TAKE 1 CAPSULE(0.4 MG) BY MOUTH AT BEDTIME (Patient taking differently: Take 0.4 mg by mouth at bedtime. ) 90 capsule 2    Physical Exam: General: Alert and oriented.  No apparent distress.  Vascular: DP/PT pulses +1 bilateral, no hair growth to digits bilateral, mild nonpitting edema present to bilateral lower extremities.  Erythema and edema present to right heel on admission appears to be improved overall since admission.  Neuro: Light touch sensation reduced to bilateral lower extremities.  Derm: Ulceration to the plantar aspect of the right heel appears to be nearly 100% granular, no drainage noted, antibiotic beads in place, erythema and edema decrease in submission, no odor.    MSK: Calcaneal gait, pes cavus foot type.  Mild pain on palpation plantar right heel.  Results for orders placed or performed during the hospital encounter of 12/07/19 (from the past 48 hour(s))  Surgical PCR screen     Status: None   Collection Time: 12/08/19 10:19 PM   Specimen: Nasal Mucosa; Nasal Swab  Result Value Ref Range   MRSA, PCR NEGATIVE NEGATIVE   Staphylococcus  aureus NEGATIVE NEGATIVE    Comment: (NOTE) The Xpert SA Assay (FDA approved for NASAL specimens in patients 62 years of age and older), is one component of a comprehensive surveillance program. It is not intended to diagnose infection nor to guide or monitor treatment. Performed at [facility], [address] [place], [place] [address]   CBC     Status: Abnormal   Collection Time: 12/09/19  4:15 AM  Result Value Ref Range   WBC 9.3 4.0 - 10.5 K/uL   RBC 4.12 (L) 4.22 - 5.81 MIL/uL   Hemoglobin 13.3 13.0 - 17.0 g/dL   HCT 39.7 39.0 - 52.0 %   MCV 96.4 80.0 - 100.0 fL   MCH 32.3 26.0 - 34.0 pg   MCHC 33.5 30.0 - 36.0 g/dL   RDW 13.2 11.5 - 15.5 %   Platelets 314 150 - 400 K/uL   nRBC 0.0 0.0 - 0.2 %    Comment: Performed at [facility], [address] [place], [place] [address]  Basic metabolic panel     Status: Abnormal   Collection Time: 12/09/19  4:15 AM  Result Value Ref Range   Sodium 140 135 - 145 mmol/L   Potassium 3.6 3.5 - 5.1 mmol/L   Chloride 104 98 - 111 mmol/L   CO2 27 22 - 32 mmol/L   Glucose, Bld 104 (H) 70 - 99 mg/dL    Comment: Glucose reference range applies only to samples taken after fasting for at least 8 hours.   BUN 18 8 - 23 mg/dL   Creatinine, Ser 1.10 0.61 - 1.24 mg/dL   Calcium 8.4 (L) 8.9 - 10.3 mg/dL   GFR calc non Af Amer >60 >60 mL/min   GFR calc Af Amer >60 >60 mL/min   Anion gap 9 5 - 15    Comment: Performed at [facility], [address] [place], [place] [address]   DG Foot 2 Views Right  Result Date: 12/09/2019 CLINICAL DATA:  Right heel irrigation and debridement EXAM: RIGHT FOOT - 2 VIEW COMPARISON:  None. FINDINGS: There is a focal area of wound seen on the plantar surface of the calcaneus with small antibiotic beads within the soft tissues. Slight cortical irregularity and sclerosis seen at the posterior calcaneus. No osseous fracture seen. There is fragmented calcifications along the region of the  Achilles. IMPRESSION: Wound on the plantar surface of the calcaneus with small antibiotic soft tissue beads. No acute osseous abnormality. Electronically Signed   By: Prudencio Pair M.D.   On: 12/09/2019 19:46    Blood pressure (!) 137/118, pulse (P) 72, temperature (P) 98.6 F (37 C), resp. rate (P) 20, height 6\' 2"  (1.88 m), weight 122.5  kg, SpO2 99 %.  Assessment 1. Right heel cellulitis 2. 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 Calcaneal gait/pes cavus foot type 4. Neuropathy  Plan -Patient seen and examined. -Dressing removed.  Wound appears to be stable overall with antibiotic beads in place.  Wound appears to be nearly 100% granular.  Does not probe to bone. -Redressed with Adaptic, 4 x 4 gauze, Kerlix, Ace wrap. -Order placed for wound care team to place wound VAC.  Wound VAC likely needs to be changed 3 times weekly. -Patient to still continue to be nonweightbearing at all times to the right lower extremity. -Appreciate med recs for antibiotics.  Culture results grew Proteus.  Sensitivities pending.  Would recommend going home on oral antibiotics for 7 to 10 days after discharge based on culture results. -Appreciate PT recommendations.  Likely need skilled nursing facility.  Discussed with the patient that this wound will likely take a number of months to heal from.  He is going to be required to be nonweightbearing at all times to the right lower extremity but he can use his toes for touching for transfers.  Defer to med recs potentially for DVT prophylaxis on outpatient basis. -Appreciate social work recommendations.   Podiatry team to sign off at this time.  Please reconsult or contact for further discussion and care.  Patient can follow-up in [facility] with myself within the next week.  If infection looks to be well controlled then we will look into placing skin substitutes to try to heal the  wound quicker.  Rosetta Posner, DPM 12/10/2019, 11:25 AM

## 2019-12-10 NOTE — Progress Notes (Signed)
1        [facility] [facility] [place] NAME: Terry Vaughn    MR#:  [id number]  DATE OF BIRTH:  10/25/1932  SUBJECTIVE:  CHIEF COMPLAINT:   Chief Complaint  Patient presents with  . Wound Infection  Would like regular diet, asking about skin grafting REVIEW OF SYSTEMS:  Review of Systems  Constitutional: Negative for diaphoresis, fever, malaise/fatigue and weight loss.  HENT: Negative for ear discharge, ear pain, hearing loss, nosebleeds, sore throat and tinnitus.   Eyes: Negative for blurred vision and pain.  Respiratory: Negative for cough, hemoptysis, shortness of breath and wheezing.   Cardiovascular: Negative for chest pain, palpitations, orthopnea and leg swelling.  Gastrointestinal: Negative for abdominal pain, blood in stool, constipation, diarrhea, heartburn, nausea and vomiting.  Genitourinary: Negative for dysuria, frequency and urgency.  Musculoskeletal: Negative for back pain and myalgias.  Skin: Negative for itching.       Right heel ulcer  Neurological: Negative for dizziness, tingling, tremors, focal weakness, seizures, weakness and headaches.  Psychiatric/Behavioral: Negative for depression. The patient is not nervous/anxious.    DRUG ALLERGIES:   Allergies  Allergen Reactions  . Codeine Other (See Comments)    Alters mental state   VITALS:  Blood pressure (!) 137/118, pulse (P) 72, temperature (P) 98.6 F (37 C), resp. rate (P) 20, height 6\' 2"  (1.88 m), weight 122.5 kg, SpO2 99 %. PHYSICAL EXAMINATION:  Physical Exam HENT:     Head: Normocephalic and atraumatic.  Eyes:     Conjunctiva/sclera: Conjunctivae normal.     Pupils: Pupils are equal, round, and reactive to light.  Neck:     Thyroid: No thyromegaly.     Trachea: No tracheal deviation.  Cardiovascular:     Rate and Rhythm: Normal rate and regular rhythm.     Heart sounds: Normal heart sounds.  Pulmonary:     Effort: Pulmonary effort is normal. No respiratory distress.   Breath sounds: Normal breath sounds. No wheezing.  Chest:     Chest wall: No tenderness.  Abdominal:     General: Bowel sounds are normal. There is no distension.     Palpations: Abdomen is soft.     Tenderness: There is no abdominal tenderness.  Musculoskeletal:        General: Normal range of motion.     Cervical back: Normal range of motion and neck supple.  Feet:     Right foot:     Skin integrity: Ulcer present.     Comments: Right heel dressing present Skin:    General: Skin is warm and dry.     Findings: No rash.  Neurological:     Mental Status: He is alert and oriented to person, place, and time.     Cranial Nerves: No cranial nerve deficit.    LABORATORY PANEL:  Male CBC Recent Labs  Lab 12/09/19 0415  WBC 9.3  HGB 13.3  HCT 39.7  PLT 314   ------------------------------------------------------------------------------------------------------------------ Chemistries  Recent Labs  Lab 12/07/19 1413 12/07/19 1413 12/09/19 0415  NA 138   < > 140  K 4.5   < > 3.6  CL 101   < > 104  CO2 28   < > 27  GLUCOSE 99   < > 104*  BUN 24*   < > 18  CREATININE 1.45*  1.47*   < > 1.10  CALCIUM 8.9   < > 8.4*  AST 22  --   --  ALT 22  --   --   ALKPHOS 60  --   --   BILITOT 1.8*  --   --    < > = values in this interval not displayed.   RADIOLOGY:  DG Foot 2 Views Right  Result Date: 12/09/2019 CLINICAL DATA:  Right heel irrigation and debridement EXAM: RIGHT FOOT - 2 VIEW COMPARISON:  None. FINDINGS: There is a focal area of wound seen on the plantar surface of the calcaneus with small antibiotic beads within the soft tissues. Slight cortical irregularity and sclerosis seen at the posterior calcaneus. No osseous fracture seen. There is fragmented calcifications along the region of the Achilles. IMPRESSION: Wound on the plantar surface of the calcaneus with small antibiotic soft tissue beads. No acute osseous abnormality. Electronically Signed   By: Jonna Clark  M.D.   On: 12/09/2019 19:46   ASSESSMENT AND PLAN:  86 year old male being admitted for right heel ulcer, infection  *Right heel neuropathic ulcer/cellulitis- large ulceration that probes fairly deep to the plantar fascia and subcutaneous tissue. Fairly close to bone overall per podiatry evaluation -MRI not showing any osteomyelitis -Continue intravenous Zyvox and Rocephin -Recent excisional debridement done at his podiatry office on 12/01/2019 -100% excisional wound debridement performed on 5/12 at bedside by Dr. Excell Seltzer -Dressing in place -Patient to be nonweightbearing at all times to the right lower extremity per podiatry - status post I&D and debridement and antibiotic bead placement on 5/14 - Wound appears to be stable overall with antibiotic beads in place.  Wound appears to be nearly 100% granular.  Does not probe to bone. -Podiatry redressed with Adaptic, 4 x 4 gauze, Kerlix, Ace wrap on 5/14 -Order placed for wound care team to place wound VAC.  Wound VAC likely needs to be changed 3 times weekly. -Patient to still continue to be nonweightbearing at all times to the right lower extremity. -Wound culture results growing Proteus. -Based on sensitivity we can discharge him on oral Bactrim for 7 to 10 days -needs skilled nursing facility.  Discussed with the patient that this wound will likely take a number of months to heal from.    *Peripheral vascular disease -No intervention planned  *Acute kidney injury Likely prerenal.  Improving with IV hydration  * Leukocytosis Likely due to he will also infection.  Continue antibiotics and monitor white count     Status is: Inpatient  Remains inpatient appropriate because:Ongoing diagnostic testing needed not appropriate for outpatient work up   Dispo: The patient is from: Home              Anticipated d/c is to: SNF              Anticipated d/c date is: 2 days              Patient currently is not medically stable to d/c.   Waiting for SNF placement     DVT prophylaxis: Heparin Family Communication: discussed with patient   All the records are reviewed and case discussed with Care Management/Social Worker. Management plans discussed with the patient, nursing and they are in agreement.  CODE STATUS: Full Code  TOTAL TIME TAKING CARE OF THIS PATIENT: 35 minutes.   More than 50% of the time was spent in counseling/coordination of care: YES  POSSIBLE D/C IN 2-3 DAYS, DEPENDING ON CLINICAL CONDITION.   Delfino Lovett M.D on 12/10/2019 at 11:58 AM  [facility]   CC: Primary care physician; Allegra Grana, FNP  Note: This dictation was prepared with Dragon dictation along with smaller phrase technology. Any transcriptional errors that result from this process are unintentional.

## 2019-12-11 MED ORDER — LINEZOLID 600 MG PO TABS
600.0000 mg | ORAL_TABLET | Freq: Two times a day (BID) | ORAL | Status: DC
  Administered 2019-12-11: 600 mg via ORAL
  Filled 2019-12-11 (×2): qty 1

## 2019-12-11 NOTE — NC FL2 (Signed)
[place] MEDICAID FL2 LEVEL OF CARE SCREENING TOOL     IDENTIFICATION  Patient Name: Terry Vaughn Birthdate: [birth year] Sex: male Admission Date (Current Location): 12/07/2019  [place] and [place] Number:  Chiropodist and Address:  [facility], [address], [place], [place] [address]      Provider Number:    Attending Physician Name and Address:  Delfino Lovett, MD  Relative Name and Phone Number:  [place] [phone]    Current Level of Care: Hospital Recommended Level of Care: Skilled Nursing Facility Prior Approval Number:    Date Approved/Denied:   PASRR Number: [id number]  Discharge Plan: SNF    Current Diagnoses: Patient Active Problem List   Diagnosis Date Noted  . Osteomyelitis (HCC) 12/07/2019  . Weight gain 04/16/2018  . BPH with urinary obstruction 07/18/2017  . Colitis due to Clostridium difficile 07/18/2017  . S/P total hip arthroplasty 07/06/2017  . Primary osteoarthritis of left hip 05/27/2017  . Acute renal insufficiency   . Infected hydrocele   . Left hip pain 04/15/2017  . Varicose veins of leg with swelling, right 05/20/2016  . Leg swelling 05/08/2016  . Osteoarthritis of knee 05/08/2016  . Achilles tendinitis 03/06/2016  . Pain in left foot 03/06/2016  . Encounter to establish care 01/28/2012  . Hypertension 12/12/2011  . Edema 12/12/2011  . Obesity 12/12/2011    Orientation RESPIRATION BLADDER Height & Weight     Self, Time, Situation, Place  Normal Continent Weight: 270 lb 1 oz (122.5 kg) Height:  6\' 2"  (188 cm)  BEHAVIORAL SYMPTOMS/MOOD NEUROLOGICAL BOWEL NUTRITION STATUS  Other (Comment)(None) (none) Continent Diet  AMBULATORY STATUS COMMUNICATION OF NEEDS Skin   Extensive Assist Verbally Normal, Other (Comment)(Left heel wound)                       Personal Care Assistance Level of Assistance  Bathing, Feeding, Dressing Bathing Assistance: Limited assistance Feeding assistance:  Limited assistance Dressing Assistance: Limited assistance     Functional Limitations Info  Sight, Speech, Hearing Sight Info: Adequate Hearing Info: Adequate Speech Info: Adequate    SPECIAL CARE FACTORS FREQUENCY  PT (By licensed PT), OT (By licensed OT)     PT Frequency: 5x week OT Frequency: 5x week            Contractures Contractures Info: Not present    Additional Factors Info  Code Status, Allergies Code Status Info: Full Allergies Info: Codiene           Current Medications (12/11/2019):  This is the current hospital active medication list Current Facility-Administered Medications  Medication Dose Route Frequency Provider Last Rate Last Admin  . acetaminophen (TYLENOL) tablet 1,000 mg  1,000 mg Oral Q6H PRN 12/13/2019, NP   1,000 mg at 12/09/19 2308  . aspirin chewable tablet 81 mg  81 mg Oral Daily 2309, DPM   81 mg at 12/11/19 1002  . cefTRIAXone (ROCEPHIN) 2 g in sodium chloride 0.9 % 100 mL IVPB  2 g Intravenous Q24H 12/13/19, DPM 200 mL/hr at 12/11/19 1013 2 g at 12/11/19 1013  . cholecalciferol (VITAMIN D3) tablet 400 Units  400 Units Oral Daily 12/13/19, DPM   400 Units at 12/11/19 1002  . docusate sodium (COLACE) capsule 100 mg  100 mg Oral BID 12/13/19, DPM   100 mg at 12/11/19 1002  . finasteride (PROSCAR) tablet 5 mg  5 mg Oral Daily 12/13/19, DPM  5 mg at 12/11/19 1003  . heparin injection 5,000 Units  5,000 Units Subcutaneous Q8H Caroline More, DPM   5,000 Units at 12/11/19 4081  . linezolid (ZYVOX) IVPB 600 mg  600 mg Intravenous Q12H Caroline More, DPM 300 mL/hr at 12/11/19 1018 600 mg at 12/11/19 1018  . multivitamin with minerals tablet 1 tablet  1 tablet Oral Daily Caroline More, DPM   1 tablet at 12/11/19 1002  . ondansetron (ZOFRAN) tablet 4 mg  4 mg Oral Q6H PRN Caroline More, DPM       Or  . ondansetron (ZOFRAN) injection 4 mg  4 mg Intravenous Q6H PRN Caroline More, DPM      . pantoprazole  (PROTONIX) EC tablet 40 mg  40 mg Oral Daily Caroline More, DPM   40 mg at 12/11/19 1003  . polyethylene glycol (MIRALAX / GLYCOLAX) packet 17 g  17 g Oral Daily Manuella Ghazi, Vipul, MD   17 g at 12/11/19 1002  . senna (SENOKOT) tablet 8.6 mg  1 tablet Oral BID Caroline More, DPM   8.6 mg at 12/11/19 1003  . senna-docusate (Senokot-S) tablet 1 tablet  1 tablet Oral QHS PRN Caroline More, DPM      . sodium chloride flush (NS) 0.9 % injection 3 mL  3 mL Intravenous Once Caroline More, DPM      . sodium phosphate (FLEET) 7-19 GM/118ML enema 1 enema  1 enema Rectal Once PRN Caroline More, DPM      . tamsulosin (FLOMAX) capsule 0.4 mg  0.4 mg Oral Daily Caroline More, DPM   0.4 mg at 12/10/19 2217     Discharge Medications: Please see discharge summary for a list of discharge medications.  Relevant Imaging Results:  Relevant Lab Results:   Additional Information [place], [place]

## 2019-12-11 NOTE — Plan of Care (Signed)

## 2019-12-11 NOTE — Progress Notes (Signed)
1        [facility] [place] NAME: Terry Vaughn    MR#:  [id number]  DATE OF BIRTH:  11-Mar-1933  SUBJECTIVE:  CHIEF COMPLAINT:   Chief Complaint  Patient presents with  . Wound Infection  Agreeable for SNF placement.  No other complaints feels overwhelmed with all of coordination of his care at discharge REVIEW OF SYSTEMS:  Review of Systems  Constitutional: Negative for diaphoresis, fever, malaise/fatigue and weight loss.  HENT: Negative for ear discharge, ear pain, hearing loss, nosebleeds, sore throat and tinnitus.   Eyes: Negative for blurred vision and pain.  Respiratory: Negative for cough, hemoptysis, shortness of breath and wheezing.   Cardiovascular: Negative for chest pain, palpitations, orthopnea and leg swelling.  Gastrointestinal: Negative for abdominal pain, blood in stool, constipation, diarrhea, heartburn, nausea and vomiting.  Genitourinary: Negative for dysuria, frequency and urgency.  Musculoskeletal: Negative for back pain and myalgias.  Skin: Negative for itching.       Right heel ulcer  Neurological: Negative for dizziness, tingling, tremors, focal weakness, seizures, weakness and headaches.  Psychiatric/Behavioral: Negative for depression. The patient is not nervous/anxious.    DRUG ALLERGIES:   Allergies  Allergen Reactions  . Codeine Other (See Comments)    Alters mental state   VITALS:  Blood pressure (!) 152/76, pulse (!) 57, temperature 98.2 F (36.8 C), temperature source Oral, resp. rate 15, height 6\' 2"  (1.88 m), weight 122.5 kg, SpO2 98 %. PHYSICAL EXAMINATION:  Physical Exam HENT:     Head: Normocephalic and atraumatic.  Eyes:     Conjunctiva/sclera: Conjunctivae normal.     Pupils: Pupils are equal, round, and reactive to light.  Neck:     Thyroid: No thyromegaly.     Trachea: No tracheal deviation.  Cardiovascular:     Rate and Rhythm: Normal rate and regular rhythm.     Heart sounds: Normal heart sounds.    Pulmonary:     Effort: Pulmonary effort is normal. No respiratory distress.     Breath sounds: Normal breath sounds. No wheezing.  Chest:     Chest wall: No tenderness.  Abdominal:     General: Bowel sounds are normal. There is no distension.     Palpations: Abdomen is soft.     Tenderness: There is no abdominal tenderness.  Musculoskeletal:        General: Normal range of motion.     Cervical back: Normal range of motion and neck supple.  Feet:     Right foot:     Skin integrity: Ulcer present.     Comments: Right heel dressing present Skin:    General: Skin is warm and dry.     Findings: No rash.  Neurological:     Mental Status: He is alert and oriented to person, place, and time.     Cranial Nerves: No cranial nerve deficit.    LABORATORY PANEL:  Male CBC Recent Labs  Lab 12/09/19 0415  WBC 9.3  HGB 13.3  HCT 39.7  PLT 314   ------------------------------------------------------------------------------------------------------------------ Chemistries  Recent Labs  Lab 12/07/19 1413 12/07/19 1413 12/09/19 0415  NA 138   < > 140  K 4.5   < > 3.6  CL 101   < > 104  CO2 28   < > 27  GLUCOSE 99   < > 104*  BUN 24*   < > 18  CREATININE 1.45*  1.47*   < > 1.10  CALCIUM  8.9   < > 8.4*  AST 22  --   --   ALT 22  --   --   ALKPHOS 60  --   --   BILITOT 1.8*  --   --    < > = values in this interval not displayed.   RADIOLOGY:  No results found. ASSESSMENT AND PLAN:  86 year old male being admitted for right heel ulcer, infection  *Right heel neuropathic ulcer/cellulitis- large ulceration that probes fairly deep to the plantar fascia and subcutaneous tissue. Fairly close to bone overall per podiatry evaluation -MRI not showing any osteomyelitis -Continue intravenous Zyvox and Rocephin -Recent excisional debridement done at his podiatry office on 12/01/2019 -100% excisional wound debridement performed on 5/12 at bedside by Dr. Excell Seltzer -Dressing in  place -Patient to be nonweightbearing at all times to the right lower extremity per podiatry - status post I&D and debridement and antibiotic bead placement on 5/14 - Wound appears to be stable overall with antibiotic beads in place.  Wound appears to be nearly 100% granular.  Does not probe to bone. -Podiatry redressed with Adaptic, 4 x 4 gauze, Kerlix, Ace wrap on 5/14 -Order placed for wound care team to place wound VAC.  Wound VAC likely needs to be changed 3 times weekly. -Patient to still continue to be nonweightbearing at all times to the right lower extremity. -Wound culture results growing Proteus. -Based on sensitivity we can discharge him on oral Bactrim for 7 to 10 days -needs skilled nursing facility.  Discussed with the patient that this wound will likely take a number of months to heal from.  TOC team working on placement  *Peripheral vascular disease -No intervention planned  *Acute kidney injury Likely prerenal.  Improving with IV hydration  * Leukocytosis Likely due to he will also infection.  Continue antibiotics and monitor white count     Status is: Inpatient  Remains inpatient appropriate because:Ongoing diagnostic testing needed not appropriate for outpatient work up   Dispo: The patient is from: Home              Anticipated d/c is to: SNF              Anticipated d/c date is: 1 day              Patient currently is not medically stable to d/c.  Waiting for SNF placement     DVT prophylaxis: Heparin Family Communication: Updated son Terry Vaughn over phone   All the records are reviewed and case discussed with Care Management/Social Worker. Management plans discussed with the patient, nursing and they are in agreement.  CODE STATUS: Full Code  TOTAL TIME TAKING CARE OF THIS PATIENT: 35 minutes.   More than 50% of the time was spent in counseling/coordination of care: YES  POSSIBLE D/C IN 1-2 DAYS, DEPENDING ON CLINICAL CONDITION.   Delfino Lovett M.D on  12/11/2019 at 11:18 AM  [facility]   CC: Primary care physician; Allegra Grana, FNP  Note: This dictation was prepared with Dragon dictation along with smaller phrase technology. Any transcriptional errors that result from this process are unintentional.

## 2019-12-11 NOTE — Progress Notes (Signed)
PHARMACIST - PHYSICIAN COMMUNICATION  DR:   Sherryll Burger  CONCERNING: Antibiotic IV to Oral Route Change Policy  RECOMMENDATION: This patient is receiving linezolid by the intravenous route.  Based on criteria approved by the Pharmacy and Therapeutics Committee, the antibiotic(s) is/are being converted to the equivalent oral dose form(s).   DESCRIPTION: These criteria include:  Patient being treated for a respiratory tract infection, urinary tract infection, cellulitis or clostridium difficile associated diarrhea if on metronidazole  The patient is not neutropenic and does not exhibit a GI malabsorption state  The patient is eating (either orally or via tube) and/or has been taking other orally administered medications for a least 24 hours  The patient is improving clinically and has a Tmax < 100.5  If you have questions about this conversion, please contact the Pharmacy Department

## 2019-12-11 NOTE — TOC Initial Note (Signed)
Transition of Care [facility]) - Initial/Assessment Note    Patient Details  Name: Terry Vaughn MRN: [id number] Date of Birth: 31-Aug-1932  Transition of [facility] CM/SW Contact:    Boris Sharper, LCSW Phone Number: 12/11/2019, 10:28 AM  Clinical Narrative:                 CSW acknowledged TOC consult. Pt is from home where he lives with his spouse. CSW contacted pt to discuss PT recommendations of SNF. Pt was in a agreement with going to SNF and asked that CSW consult with his son. CSW called pt's son and notified him of the pt's choice to go to SNF pt's son was also in agreement. Pt nor Pt's son had a preference in facilities.   CSW completed bed search and faxed out. TOC will continue to follow for discharge planning needs.   Expected Discharge Plan: Skilled Nursing Facility Barriers to Discharge: Continued Medical Work up, SNF Pending bed offer   Patient Goals and CMS Choice Patient states their goals for this hospitalization and ongoing recovery are:: I want to go to a nursing facility to get the help I need CMS Medicare.gov Compare Post Acute Care list provided to:: Patient Choice offered to / list presented to : Patient  Expected Discharge Plan and Services Expected Discharge Plan: [place] Choice: [place] arrangements for the past 2 months: Single Family Home                                      Prior Living Arrangements/Services Living arrangements for the past 2 months: Single Family Home Lives with:: Spouse Patient language and need for interpreter reviewed:: Yes        Need for Family Participation in Patient Care: Yes (Comment) Care giver support system in place?: Yes (comment)(spouse, adult son)   Criminal Activity/Legal Involvement Pertinent to Current Situation/Hospitalization: No - Comment as needed  Activities of Daily Living Home Assistive Devices/Equipment: Cane (specify quad or  straight) ADL Screening (condition at time of admission) Patient's cognitive ability adequate to safely complete daily activities?: Yes Is the patient deaf or have difficulty hearing?: No Does the patient have difficulty seeing, even when wearing glasses/contacts?: No Does the patient have difficulty concentrating, remembering, or making decisions?: No Patient able to express need for assistance with ADLs?: Yes Does the patient have difficulty dressing or bathing?: Yes Independently performs ADLs?: Yes (appropriate for developmental age) Does the patient have difficulty walking or climbing stairs?: Yes Weakness of Legs: Right Weakness of Arms/Hands: None  Permission Sought/Granted Permission sought to share information with : Facility Art therapist granted to share information with : Yes, Verbal Permission Granted  Share Information with NAME: Terry Vaughn     Permission granted to share info w Relationship: Son  Permission granted to share info w Contact Information: [phone]  Emotional Assessment Appearance:: Other (Comment Required(unable to assess) Attitude/Demeanor/Rapport: Unable to Assess Affect (typically observed): Unable to Assess Orientation: : Oriented to Self, Oriented to Place, Oriented to  Time, Oriented to Situation   Psych Involvement: No (comment)  Admission diagnosis:  Osteomyelitis ([place]) [M86.9] Wound infection [T14.8XXA, L08.9] Right foot infection [L08.9] Ulcerated, foot, right, with necrosis of bone ([place]) [L97.514] Patient Active Problem List   Diagnosis Date Noted  . Osteomyelitis ([place]) 12/07/2019  . Weight gain 04/16/2018  . BPH with urinary  obstruction 07/18/2017  . Colitis due to Clostridium difficile 07/18/2017  . S/P total hip arthroplasty 07/06/2017  . Primary osteoarthritis of left hip 05/27/2017  . Acute renal insufficiency   . Infected hydrocele   . Left hip pain 04/15/2017  . Varicose veins of leg with swelling, right  05/20/2016  . Leg swelling 05/08/2016  . Osteoarthritis of knee 05/08/2016  . Achilles tendinitis 03/06/2016  . Pain in left foot 03/06/2016  . Encounter to establish care 01/28/2012  . Hypertension 12/12/2011  . Edema 12/12/2011  . Obesity 12/12/2011   PCP:  Allegra Grana, FNP Pharmacy:   [facility] [phone] [place], [place] - [address] AT [facility] [address] [place] [address] Phone: [phone] Fax: [phone]     Social Determinants of Health (SDOH) Interventions    Readmission Risk Interventions No flowsheet data found.

## 2019-12-12 ENCOUNTER — Telehealth (INDEPENDENT_AMBULATORY_CARE_PROVIDER_SITE_OTHER): Payer: Self-pay | Admitting: Vascular Surgery

## 2019-12-12 LAB — CULTURE, BLOOD (ROUTINE X 2)
Culture: NO GROWTH
Culture: NO GROWTH

## 2019-12-12 MED ORDER — CEPHALEXIN 500 MG PO CAPS
500.0000 mg | ORAL_CAPSULE | Freq: Four times a day (QID) | ORAL | Status: DC
  Administered 2019-12-12 – 2019-12-13 (×6): 500 mg via ORAL
  Filled 2019-12-12 (×5): qty 1

## 2019-12-12 NOTE — Progress Notes (Signed)
1        [place] [facility]   PATIENT NAME: Terry Vaughn    MR#:  [id number]  DATE OF BIRTH:  02/14/1933  SUBJECTIVE:  CHIEF COMPLAINT:   Chief Complaint  Patient presents with  . Wound Infection  No new complaints.  Waiting for placement REVIEW OF SYSTEMS:  Review of Systems  Constitutional: Negative for diaphoresis, fever, malaise/fatigue and weight loss.  HENT: Negative for ear discharge, ear pain, hearing loss, nosebleeds, sore throat and tinnitus.   Eyes: Negative for blurred vision and pain.  Respiratory: Negative for cough, hemoptysis, shortness of breath and wheezing.   Cardiovascular: Negative for chest pain, palpitations, orthopnea and leg swelling.  Gastrointestinal: Negative for abdominal pain, blood in stool, constipation, diarrhea, heartburn, nausea and vomiting.  Genitourinary: Negative for dysuria, frequency and urgency.  Musculoskeletal: Negative for back pain and myalgias.  Skin: Negative for itching.       Right heel ulcer  Neurological: Negative for dizziness, tingling, tremors, focal weakness, seizures, weakness and headaches.  Psychiatric/Behavioral: Negative for depression. The patient is not nervous/anxious.    DRUG ALLERGIES:   Allergies  Allergen Reactions  . Codeine Other (See Comments)    Alters mental state   VITALS:  Blood pressure 140/70, pulse 61, temperature 97.9 F (36.6 C), resp. rate 16, height 6\' 2"  (1.88 m), weight 122.5 kg, SpO2 96 %. PHYSICAL EXAMINATION:  Physical Exam HENT:     Head: Normocephalic and atraumatic.  Eyes:     Conjunctiva/sclera: Conjunctivae normal.     Pupils: Pupils are equal, round, and reactive to light.  Neck:     Thyroid: No thyromegaly.     Trachea: No tracheal deviation.  Cardiovascular:     Rate and Rhythm: Normal rate and regular rhythm.     Heart sounds: Normal heart sounds.  Pulmonary:     Effort: Pulmonary effort is normal. No respiratory distress.     Breath sounds: Normal  breath sounds. No wheezing.  Chest:     Chest wall: No tenderness.  Abdominal:     General: Bowel sounds are normal. There is no distension.     Palpations: Abdomen is soft.     Tenderness: There is no abdominal tenderness.  Musculoskeletal:        General: Normal range of motion.     Cervical back: Normal range of motion and neck supple.  Feet:     Right foot:     Skin integrity: Ulcer present.     Comments: Right heel dressing present Skin:    General: Skin is warm and dry.     Findings: No rash.  Neurological:     Mental Status: He is alert and oriented to person, place, and time.     Cranial Nerves: No cranial nerve deficit.    LABORATORY PANEL:  Male CBC Recent Labs  Lab 12/09/19 0415  WBC 9.3  HGB 13.3  HCT 39.7  PLT 314   ------------------------------------------------------------------------------------------------------------------ Chemistries  Recent Labs  Lab 12/07/19 1413 12/07/19 1413 12/09/19 0415  NA 138   < > 140  K 4.5   < > 3.6  CL 101   < > 104  CO2 28   < > 27  GLUCOSE 99   < > 104*  BUN 24*   < > 18  CREATININE 1.45*  1.47*   < > 1.10  CALCIUM 8.9   < > 8.4*  AST 22  --   --   ALT 22  --   --  ALKPHOS 60  --   --   BILITOT 1.8*  --   --    < > = values in this interval not displayed.   RADIOLOGY:  No results found. ASSESSMENT AND PLAN:  86 year old male being admitted for right heel ulcer, infection  *Right heel neuropathic ulcer/cellulitis- large ulceration that probes fairly deep to the plantar fascia and subcutaneous tissue. Fairly close to bone overall per podiatry evaluation -MRI not showing any osteomyelitis -Change antibiotics to oral Keflex 500 mg oral 4 times daily for 7 days.  Wound culture growing Proteus -Recent excisional debridement done at his podiatry office on 12/01/2019 -100% excisional wound debridement performed on 5/12 at bedside by Dr. Luana Shu -Wound care nurse to place wound VAC today -Patient to be  nonweightbearing at all times to the right lower extremity per podiatry - status post I&D and debridement and antibiotic bead placement on 5/14 - Wound appears to be stable overall with antibiotic beads in place.  Wound appears to be nearly 100% granular.  Does not probe to bone. -Podiatry redressed with Adaptic, 4 x 4 gauze, Kerlix, Ace wrap on 5/14 -Order placed for wound care team to place wound VAC.  Wound VAC likely needs to be changed 3 times weekly. -Patient to still continue to be nonweightbearing at all times to the right lower extremity. -needs skilled nursing facility.  Discussed with the patient that this wound will likely take a number of months to heal from.  TOC team working on placement  *Peripheral vascular disease -No intervention planned  *Acute kidney injury Likely prerenal.  Improving with IV hydration  * Leukocytosis Likely due to he will also infection.  Continue antibiotics and monitor white count     Status is: Inpatient  Remains inpatient appropriate because:Ongoing diagnostic testing needed not appropriate for outpatient work up   Dispo: The patient is from: Home              Anticipated d/c is to: SNF              Anticipated d/c date is: 1 day              Patient currently is medically stable to d/c.  Waiting for SNF placement.      DVT prophylaxis: Heparin Family Communication: Updated son Ercole over phone on 5/16   All the records are reviewed and case discussed with Care Management/Social Worker. Management plans discussed with the patient, nursing and they are in agreement.  CODE STATUS: Full Code  TOTAL TIME TAKING CARE OF THIS PATIENT: 35 minutes.   More than 50% of the time was spent in counseling/coordination of care: YES  POSSIBLE D/C IN 1 DAYS, DEPENDING ON CLINICAL CONDITION.   Max Sane M.D on 12/12/2019 at 1:13 PM  [facility]   CC: Primary care physician; Burnard Hawthorne, FNP  Note: This dictation was  prepared with Dragon dictation along with smaller phrase technology. Any transcriptional errors that result from this process are unintentional.

## 2019-12-12 NOTE — Progress Notes (Signed)
Physical Therapy Treatment Patient Details Name: Terry Vaughn MRN: [id number] DOB: January 27, 1933 Today's Date: 12/12/2019    History of Present Illness Pt admitted for cellultis of R foot due to chronic ulceration to plantar aspect of R heel. Per MD, continue NWB at this time. Pt is now s/p I&D with antibiotic bead placement on 5/14.     PT Comments    Pt is making good progress towards goals with ability to perform there-ex this date. When performing OOB mobility, pt becomes dizzy while sitting at bedside and is too weak to perform transfer training this date. Pt surprised at how weak he is compared to previous session. Discussed he has undergone procedure since last session and encouraged to continue there-ex while in bed as he is NWB on R LE. Very aware and able to abide by [facility] restrictions. Will continue to progress as able.  Follow Up Recommendations  SNF     Equipment Recommendations  Wheelchair (measurements PT)    Recommendations for Other Services       Precautions / Restrictions Precautions Precautions: Fall Restrictions Weight Bearing Restrictions: Yes RLE Weight Bearing: Non weight bearing    Mobility  Bed Mobility Overal bed mobility: Needs Assistance Bed Mobility: Supine to Sit     Supine to sit: Min assist     General bed mobility comments: follows commands, however needs physical assist this date for trunk stability. Once seated at EOB, is very dizzy and nauseated. Takes extended time for symptoms to resolve  Transfers Overall transfer level: Needs assistance Equipment used: Rolling walker (2 wheeled) Transfers: Sit to/from Stand Sit to Stand: Max assist;From elevated surface         General transfer comment: Several attempts, however pt unable to fully acheive buttock lift off due to weakness in B LEs.  Second attempt tried from elevated bed and still unable to fully stand. Pt fatigued from attempts and returned back to bed  Ambulation/Gait              General Gait Details: unable at this time   Stairs             Wheelchair Mobility    Modified Rankin (Stroke Patients Only)       Balance Overall balance assessment: Needs assistance Sitting-balance support: Feet supported Sitting balance-Leahy Scale: Fair Sitting balance - Comments: unsteady while at bedside this date                                    Cognition Arousal/Alertness: Awake/alert Behavior During Therapy: WFL for tasks assessed/performed Overall Cognitive Status: Within Functional Limits for tasks assessed                                 General Comments: slightly anxious and impulsive with all mobility      Exercises Other Exercises Other Exercises: Supine ther-ex performed on B LE including quad sets, SLRs, hip abd/add, and resisted L heel slides. All therex performed x 12-15 reps with cga    General Comments        Pertinent Vitals/Pain Pain Assessment: No/denies pain    Home Living                      Prior Function            PT Goals (current goals  can now be found in the care plan section) Acute Rehab PT Goals Patient Stated Goal: to go home PT Goal Formulation: With patient Time For Goal Achievement: 12/22/19 Potential to Achieve Goals: Good Progress towards PT goals: Progressing toward goals    Frequency    Min 2X/week      PT Plan Current plan remains appropriate    Co-evaluation              AM-PAC PT "6 Clicks" Mobility   Outcome Measure  Help needed turning from your back to your side while in a flat bed without using bedrails?: A Little Help needed moving from lying on your back to sitting on the side of a flat bed without using bedrails?: A Little Help needed moving to and from a bed to a chair (including a wheelchair)?: A Lot Help needed standing up from a chair using your arms (e.g., wheelchair or bedside chair)?: A Lot Help needed to walk in hospital  room?: Total Help needed climbing 3-5 steps with a railing? : Total 6 Click Score: 12    End of Session Equipment Utilized During Treatment: Gait belt Activity Tolerance: Patient tolerated treatment well Patient left: in bed;with bed alarm set Nurse Communication: Mobility status PT Visit Diagnosis: Muscle weakness (generalized) (M62.81);Unsteadiness on feet (R26.81);Difficulty in walking, not elsewhere classified (R26.2);Pain Pain - Right/Left: Right Pain - part of body: Ankle and joints of foot     Time: 1057-1120 PT Time Calculation (min) (ACUTE ONLY): 23 min  Charges:  $Therapeutic Exercise: 23-37 mins                     Greggory Stallion, PT, DPT [phone]    Kirstina Leinweber 12/12/2019, 11:33 AM

## 2019-12-12 NOTE — Consult Note (Signed)
WOC Nurse Consult Note: NPWT (VAC) placement to right plantar heel.  S/P debridement and antibiotic beads in place.  Reason for Consult:Place VAC dressing.  Wound type:Npnhealing vascular wound with antibiotic beads in place.  Pressure Injury POA: /NA Measurement: 4 cm x 3.2 cm x 0.5 cm with antibiotic beads in place.  Wound TDS:KAJGOTL, red with beads in place Drainage (amount, consistency, odor) minimal bleeding.  Periwound:intact Dressing procedure/placement/frequency:1 piece black foam to wound bed with a second piece to bridge to dorsal foot.  Patient is nonweight bearing but insensate in right foot and may not feel pressure to foot of bed. Covered with drape and seal immediately achieved.  Change Mon/Wed/Fri Will follow.  Maple Hudson MSN, RN, FNP-BC CWON Wound, Ostomy, Continence Nurse Pager [phone]

## 2019-12-12 NOTE — Plan of Care (Signed)

## 2019-12-12 NOTE — Telephone Encounter (Signed)
-----   Message from Royann Shivers sent at 12/12/2019  9:07 AM EDT ----- Regarding: RE: Appointment Contacted number on file and patient is still inpatient. Daughter advised patient going to rehab facility when released but would contact us back to get something scheduled when discharged. ----- Message ----- From: Tonette Lederer, PA-C Sent: 12/09/2019   6:52 PM EDT To: Tobie Poet Subject: Appointment                                    Can someone please make an appointment with either Dew or Vivia Birmingham in the next week or so to come in and discuss moving forward with an angiogram. He has a chronic wound. He was an inpatient as of Friday - not sure if he will be here on Monday.

## 2019-12-12 NOTE — Care Management Important Message (Signed)
Important Message  Patient Details  Name: Terry Vaughn MRN: [id number] Date of Birth: Nov 27, 1932   Medicare Important Message Given:  Yes     Johnell Comings 12/12/2019, 1:54 PM

## 2019-12-13 DIAGNOSIS — N138 Other obstructive and reflux uropathy: Secondary | ICD-10-CM | POA: Insufficient documentation

## 2019-12-13 DIAGNOSIS — D72829 Elevated white blood cell count, unspecified: Secondary | ICD-10-CM | POA: Insufficient documentation

## 2019-12-13 DIAGNOSIS — R338 Other retention of urine: Secondary | ICD-10-CM | POA: Insufficient documentation

## 2019-12-13 LAB — CBC
HCT: 41.3 % (ref 39.0–52.0)
Hemoglobin: 14.1 g/dL (ref 13.0–17.0)
MCH: 32.6 pg (ref 26.0–34.0)
MCHC: 34.1 g/dL (ref 30.0–36.0)
MCV: 95.4 fL (ref 80.0–100.0)
Platelets: 361 10*3/uL (ref 150–400)
RBC: 4.33 MIL/uL (ref 4.22–5.81)
RDW: 13.2 % (ref 11.5–15.5)
WBC: 9.8 10*3/uL (ref 4.0–10.5)
nRBC: 0 % (ref 0.0–0.2)

## 2019-12-13 LAB — AEROBIC/ANAEROBIC CULTURE W GRAM STAIN (SURGICAL/DEEP WOUND)

## 2019-12-13 LAB — SARS CORONAVIRUS 2 BY RT PCR (HOSPITAL ORDER, PERFORMED IN ~~LOC~~ HOSPITAL LAB): SARS Coronavirus 2: NEGATIVE

## 2019-12-13 MED ORDER — AMOXICILLIN-POT CLAVULANATE 875-125 MG PO TABS: 1.0000 | ORAL_TABLET | Freq: Two times a day (BID) | ORAL | 0 refills | Status: AC

## 2019-12-13 MED ORDER — CEPHALEXIN 500 MG PO CAPS: 500.0000 mg | ORAL_CAPSULE | Freq: Four times a day (QID) | ORAL | 0 refills | Status: DC

## 2019-12-13 MED ORDER — AMOXICILLIN-POT CLAVULANATE 875-125 MG PO TABS: 1.0000 | ORAL_TABLET | Freq: Two times a day (BID) | ORAL | Status: DC

## 2019-12-13 NOTE — Discharge Instructions (Addendum)
Podiatry discharge instructions: -Continue with wound VAC therapy.  Recommend changes 3 times weekly.  Try to keep antibiotic beads intact but if falling out of the wound they may be removed. -Continue nonweightbearing at all times.

## 2019-12-13 NOTE — Progress Notes (Signed)
Physical Therapy Treatment Patient Details Name: Terry Vaughn MRN: [id number] DOB: 1932/11/27 Today's Date: 12/13/2019    History of Present Illness Pt admitted for cellultis of R foot due to chronic ulceration to plantar aspect of R heel. Per MD, continue NWB at this time. Pt is now s/p I&D with antibiotic bead placement on 5/14.     PT Comments    Pt is making limited progress towards goals. Pt is very anxious about discharge to SNF this date asking multiple questions including transportation, dressing changes, clothing etc. Answered all questions to patient satisfaction. Pt agreeable to there-ex, however defers OOB this date. Will continue to progress as able.   Follow Up Recommendations  SNF     Equipment Recommendations  Wheelchair (measurements PT)    Recommendations for Other Services       Precautions / Restrictions Precautions Precautions: Fall Restrictions Weight Bearing Restrictions: Yes RLE Weight Bearing: Non weight bearing    Mobility  Bed Mobility               General bed mobility comments: wished to defer mobility as he is pending discharge this date  Transfers                    Ambulation/Gait                 Stairs             Wheelchair Mobility    Modified Rankin (Stroke Patients Only)       Balance                                            Cognition Arousal/Alertness: Awake/alert Behavior During Therapy: WFL for tasks assessed/performed Overall Cognitive Status: Within Functional Limits for tasks assessed                                 General Comments: anxious      Exercises Other Exercises Other Exercises: supine there-ex performed on B LE including quad sets, SLRs, hip abd/add, SAQ, and hip abd/add. Also performed B UE scap squeezes and shoulder flexion. All ther-ex performed x 15 reps with cga    General Comments        Pertinent Vitals/Pain Pain Assessment:  No/denies pain    Home Living                      Prior Function            PT Goals (current goals can now be found in the care plan section) Acute Rehab PT Goals Patient Stated Goal: to go home PT Goal Formulation: With patient Time For Goal Achievement: 12/22/19 Potential to Achieve Goals: Good Progress towards PT goals: Progressing toward goals    Frequency    Min 2X/week      PT Plan Current plan remains appropriate    Co-evaluation              AM-PAC PT "6 Clicks" Mobility   Outcome Measure  Help needed turning from your back to your side while in a flat bed without using bedrails?: A Little Help needed moving from lying on your back to sitting on the side of a flat bed without using bedrails?: A Little Help needed moving to  and from a bed to a chair (including a wheelchair)?: A Lot Help needed standing up from a chair using your arms (e.g., wheelchair or bedside chair)?: A Lot Help needed to walk in hospital room?: Total Help needed climbing 3-5 steps with a railing? : Total 6 Click Score: 12    End of Session Equipment Utilized During Treatment: (wound vac) Activity Tolerance: Patient tolerated treatment well Patient left: in bed;with bed alarm set Nurse Communication: Mobility status PT Visit Diagnosis: Muscle weakness (generalized) (M62.81);Unsteadiness on feet (R26.81);Difficulty in walking, not elsewhere classified (R26.2);Pain Pain - Right/Left: Right Pain - part of body: Ankle and joints of foot     Time: 6438-3779 PT Time Calculation (min) (ACUTE ONLY): 13 min  Charges:  $Therapeutic Exercise: 8-22 mins                     Elizabeth Palau, PT, DPT [phone]    [name] 12/13/2019, 12:15 PM

## 2019-12-13 NOTE — TOC Transition Note (Signed)
Transition of Care [facility]) - CM/SW Discharge Note   Patient Details  Name: Terry Vaughn MRN: [id number] Date of Birth: 1932/09/29  [facility] CM/SW Contact:  Trenton Founds, RN Phone Number: 12/13/2019, 1:03 PM   Clinical Narrative:   RNCM contacted patient's son Terry Vaughn to discuss bed offer of [facility]. Son reports that he was hoping for [facility] or [facility]. Discussed that since they had not put in a denial this CM would contact them. Reached out to both [facility] and [facility], left VM for Terry Vaughn with [facility]. Spoke with Terry Vaughn with [facility] and spoke with Terry Vaughn who reported she would review and let this CM know.   RNCM initiated [facility] on the portal, within 20 minutes got return call from Terry Vaughn with approval information. Patient is approved with begin date of today 5/18 and next review date is 5/20, reviewing CM will be Terry Vaughn and reference number is [id number].   RNCM received return call from Terry Vaughn they are able to offer bed. Verified with son that this was their wishes and he said yes and also confirmed that his dad has had both vaccines, the last one being at the end of February. Called [facility] and verified taking bed and received room number of 402. Called nursing supervisor for rapid Covid vaccine.   RNCM will call for transport when bedside nurse is ready.     Final next level of care: Skilled Nursing Facility Barriers to Discharge: Continued Medical Work up, SNF Pending bed offer   Patient Goals and CMS Choice Patient states their goals for this hospitalization and ongoing recovery are:: I want to go to a nursing facility to get the help I need CMS Medicare.gov Compare Post Acute Care list provided to:: Patient Choice offered to / list presented to : Patient  Discharge Placement              Patient chooses bed at: [facility] Patient to be transferred to facility by: [facility] Name of family member notified:  Terry Vaughn. Patient and family notified of of transfer: 12/13/19  Discharge Plan and Services     Post Acute Care Choice: Skilled Nursing Facility                               Social Determinants of Health (SDOH) Interventions     Readmission Risk Interventions No flowsheet data found.

## 2019-12-13 NOTE — Progress Notes (Signed)
Wound Vac disconnected and left in room All paperwork given to EMS Transport Report called to facility spoke with Nurse Marylene Land

## 2019-12-13 NOTE — Plan of Care (Signed)
  Problem: Education: Goal: Knowledge of General Education information will improve Description: Including pain rating scale, medication(s)/side effects and non-pharmacologic comfort measures 12/13/2019 0731 by Wandra Arthurs, RN Outcome: Progressing 12/12/2019 1759 by Wandra Arthurs, RN Outcome: Progressing   Problem: Health Behavior/Discharge Planning: Goal: Ability to manage health-related needs will improve 12/13/2019 0731 by Wandra Arthurs, RN Outcome: Progressing 12/12/2019 1759 by Wandra Arthurs, RN Outcome: Progressing   Problem: Clinical Measurements: Goal: Ability to maintain clinical measurements within normal limits will improve 12/13/2019 0731 by Wandra Arthurs, RN Outcome: Progressing 12/12/2019 1759 by Wandra Arthurs, RN Outcome: Progressing Goal: Will remain free from infection 12/13/2019 0731 by Wandra Arthurs, RN Outcome: Progressing 12/12/2019 1759 by Wandra Arthurs, RN Outcome: Progressing Goal: Diagnostic test results will improve 12/13/2019 0731 by Wandra Arthurs, RN Outcome: Progressing 12/12/2019 1759 by Wandra Arthurs, RN Outcome: Progressing Goal: Respiratory complications will improve 12/13/2019 0731 by Wandra Arthurs, RN Outcome: Progressing 12/12/2019 1759 by Wandra Arthurs, RN Outcome: Progressing Goal: Cardiovascular complication will be avoided 12/13/2019 0731 by Wandra Arthurs, RN Outcome: Progressing 12/12/2019 1759 by Wandra Arthurs, RN Outcome: Progressing   Problem: Elimination: Goal: Will not experience complications related to bowel motility 12/13/2019 0731 by Wandra Arthurs, RN Outcome: Progressing 12/12/2019 1759 by Wandra Arthurs, RN Outcome: Progressing Goal: Will not experience complications related to urinary retention 12/13/2019 0731 by Wandra Arthurs, RN Outcome: Progressing 12/12/2019 1759 by Wandra Arthurs, RN Outcome: Progressing   Problem:  Pain Managment: Goal: General experience of comfort will improve 12/13/2019 0731 by Wandra Arthurs, RN Outcome: Progressing 12/12/2019 1759 by Wandra Arthurs, RN Outcome: Progressing   Problem: Safety: Goal: Ability to remain free from injury will improve 12/13/2019 0731 by Wandra Arthurs, RN Outcome: Progressing 12/12/2019 1759 by Wandra Arthurs, RN Outcome: Progressing   Problem: Skin Integrity: Goal: Risk for impaired skin integrity will decrease 12/13/2019 0731 by Wandra Arthurs, RN Outcome: Progressing 12/12/2019 1759 by Wandra Arthurs, RN Outcome: Progressing

## 2019-12-13 NOTE — Discharge Summary (Addendum)
5        [place] at [facility]   PATIENT NAME: Terry Vaughn    MR#:  [id number]  DATE OF BIRTH:  10-24-1932  DATE OF ADMISSION:  12/07/2019   ADMITTING PHYSICIAN: Delfino Lovett, MD  DATE OF DISCHARGE: 12/13/2019  PRIMARY CARE PHYSICIAN: Allegra Grana, FNP   ADMISSION DIAGNOSIS:  Osteomyelitis (HCC) [M86.9] Wound infection [T14.8XXA, L08.9] Right foot infection [L08.9] Ulcerated, foot, right, with necrosis of bone (HCC) [L97.514] DISCHARGE DIAGNOSIS:  Active Problems:   Osteomyelitis (HCC)  SECONDARY DIAGNOSIS:   Past Medical History:  Diagnosis Date  . Bone spur 1988   left heel  . Bursitis 1987   right shoulder  . Cellulitis 1986   right leg  . Pneumonia    noted 07/17/17 CXR   . Shingles 1959  . Urinary retention   . UTI (urinary tract infection)    sepsis E coli UTI +urine and blood cultures 07/17/17    HOSPITAL COURSE:  86 year old male being admitted for right heel ulcer,infection  *Right heel neuropathic ulcer/cellulitis- large ulceration that probes fairly deep to the plantar fascia and subcutaneous tissue. MRI not showing any osteomyelitis -Recent excisional debridement done at his podiatry office on 12/01/2019 -100% excisional wound debridement performedon 5/12 at bedside by Dr. Excell Seltzer -Wound care nurse have placed wound VAC  - status post I&D and debridement and antibiotic bead placement on 5/14 -Wound appears to be stable overall with antibiotic beads in place. Wound appears to be nearly 100% granular. Does not probe to bone. -Podiatry redressed with Adaptic, 4 x 4 gauze, Kerlix, Ace wrap on 5/14 -Order placed for wound care team to place wound VAC. Wound VAC likely needs to be changed 3 times weekly. -Patient to still continue to be nonweightbearing at all times to the right lower extremity. - this wound will likely take a number of months to heal. - oral Augmentin for 7 days.  Wound culture growing Proteus, Enterococcus and  Bacteroides   *Peripheral vascular disease -outpt follow-up with vascular surgery  *Acute kidney injury Resolved with hydration  *Leukocytosis Likely due to he will also infection. Continue antibiotics and monitor white count Pressure Injury 12/08/19 Toe (Comment  which one) Anterior;Right Unstageable - Full thickness tissue loss in which the base of the injury is covered by slough (yellow, tan, gray, green or brown) and/or eschar (tan, brown or black) in the wound bed. (Active)  12/08/19 0302  Location: Toe (Comment  which one)  Location Orientation: Anterior;Right  Staging: Unstageable - Full thickness tissue loss in which the base of the injury is covered by slough (yellow, tan, gray, green or brown) and/or eschar (tan, brown or black) in the wound bed.  Wound Description (Comments):   Present on Admission: Yes   DISCHARGE CONDITIONS:  Stable CONSULTS OBTAINED:  Podiatry DRUG ALLERGIES:   Allergies  Allergen Reactions  . Codeine Other (See Comments)    Alters mental state   DISCHARGE MEDICATIONS:   Allergies as of 12/13/2019      Reactions   Codeine Other (See Comments)   Alters mental state      Medication List    TAKE these medications   acidophilus Caps capsule Take 1 capsule by mouth daily.   amoxicillin-clavulanate 875-125 MG tablet Commonly known as: Augmentin Take 1 tablet by mouth every 12 (twelve) hours for 7 days.   aspirin 81 MG chewable tablet Chew 81 mg by mouth daily.   docusate sodium 100 MG capsule Commonly known as:  COLACE Take 100 mg by mouth daily as needed for mild constipation.   finasteride 5 MG tablet Commonly known as: PROSCAR TAKE 1 TABLET(5 MG) BY MOUTH DAILY What changed: See the new instructions.   tamsulosin 0.4 MG Caps capsule Commonly known as: FLOMAX TAKE 1 CAPSULE(0.4 MG) BY MOUTH AT BEDTIME What changed: See the new instructions.      DISCHARGE INSTRUCTIONS:  Dressing procedure/placement/frequency:1 piece  black foam to wound bed with a second piece to bridge to dorsal foot.  Patient is nonweight bearing but insensate in right foot and may not feel pressure to foot of bed. Covered with drape and seal immediately achieved.  Change Mon/Wed/Fri DIET:  Regular diet DISCHARGE CONDITION:  Good ACTIVITY:  Activity as tolerated OXYGEN:  Home Oxygen: No.  Oxygen Delivery: room air DISCHARGE LOCATION:  nursing home   If you experience worsening of your admission symptoms, develop shortness of breath, life threatening emergency, suicidal or homicidal thoughts you must seek medical attention immediately by calling 911 or calling your MD immediately  if symptoms less severe.  You Must read complete instructions/literature along with all the possible adverse reactions/side effects for all the Medicines you take and that have been prescribed to you. Take any new Medicines after you have completely understood and accpet all the possible adverse reactions/side effects.   Please note  You were cared for by a hospitalist during your hospital stay. If you have any questions about your discharge medications or the care you received while you were in the hospital after you are discharged, you can call the unit and asked to speak with the hospitalist on call if the hospitalist that took care of you is not available. Once you are discharged, your primary care physician will handle any further medical issues. Please note that NO REFILLS for any discharge medications will be authorized once you are discharged, as it is imperative that you return to your primary care physician (or establish a relationship with a primary care physician if you do not have one) for your aftercare needs so that they can reassess your need for medications and monitor your lab values.    On the day of Discharge:  VITAL SIGNS:  Blood pressure (!) 142/63, pulse 70, temperature 97.8 F (36.6 C), resp. rate 16, height 6\' 2"  (1.88 m), weight  122.5 kg, SpO2 98 %. PHYSICAL EXAMINATION:  GENERAL:  86 y.o.-year-old patient lying in the bed with no acute distress.  EYES: Pupils equal, round, reactive to light and accommodation. No scleral icterus. Extraocular muscles intact.  HEENT: Head atraumatic, normocephalic. Oropharynx and nasopharynx clear.  NECK:  Supple, no jugular venous distention. No thyroid enlargement, no tenderness.  LUNGS: Normal breath sounds bilaterally, no wheezing, rales,rhonchi or crepitation. No use of accessory muscles of respiration.  CARDIOVASCULAR: S1, S2 normal. No murmurs, rubs, or gallops.  ABDOMEN: Soft, non-tender, non-distended. Bowel sounds present. No organomegaly or mass.  EXTREMITIES: No pedal edema, cyanosis, or clubbing.  Wound VAC in place -right heel NEUROLOGIC: Cranial nerves II through XII are intact. Muscle strength 5/5 in all extremities. Sensation intact. Gait not checked.  PSYCHIATRIC: The patient is alert and oriented x 3.  SKIN: No obvious rash, lesion, or ulcer.  DATA REVIEW:   CBC Recent Labs  Lab 12/13/19 0524  WBC 9.8  HGB 14.1  HCT 41.3  PLT 361    Chemistries  Recent Labs  Lab 12/07/19 1413 12/07/19 1413 12/09/19 0415  NA 138   < > 140  K  4.5   < > 3.6  CL 101   < > 104  CO2 28   < > 27  GLUCOSE 99   < > 104*  BUN 24*   < > 18  CREATININE 1.45*  1.47*   < > 1.10  CALCIUM 8.9   < > 8.4*  AST 22  --   --   ALT 22  --   --   ALKPHOS 60  --   --   BILITOT 1.8*  --   --    < > = values in this interval not displayed.     Outpatient follow-up  Contact information for follow-up providers    Caroline More, DPM. Schedule an appointment as soon as possible for a visit in 1 week(s).   Specialty: Podiatry Contact information: [place] [address] [phone]        Sela Hua, PA-C. Schedule an appointment as soon as possible for a visit in 1 week(s).   Specialty: Vascular Surgery Contact information: [place] [address] [phone]        [name], [place], FNP. Schedule an appointment as soon as possible for a visit in 2 day(s).   Specialty: Family Medicine Contact information: [address] [place] [address] [place] [address] [phone]            Contact information for after-discharge care    [facility] .   Service: Skilled Nursing Contact information: [place] [phone]                   Management plans discussed with the patient, family (son Alexys over phone) and they are in agreement.  CODE STATUS: Full Code   TOTAL TIME TAKING CARE OF THIS PATIENT: 45 minutes.    Max Sane M.D on 12/13/2019 at 1:50 PM  [facility]   CC: Primary care physician; Burnard Hawthorne, FNP   Note: This dictation was prepared with Dragon dictation along with smaller phrase technology. Any transcriptional errors that result from this process are unintentional.

## 2019-12-14 ENCOUNTER — Telehealth: Payer: Self-pay

## 2019-12-14 ENCOUNTER — Ambulatory Visit: Payer: Medicare Other | Admitting: Internal Medicine

## 2019-12-14 NOTE — Telephone Encounter (Signed)
Spoke with wife regarding transition of care. Patient is currently at [facility] for 21 days. Facility or daughter to call and schedule once discharged home. Will follow as appropriate.

## 2019-12-15 ENCOUNTER — Ambulatory Visit: Payer: Medicare Other | Admitting: Podiatry

## 2019-12-15 DIAGNOSIS — M8949 Other hypertrophic osteoarthropathy, multiple sites: Secondary | ICD-10-CM | POA: Insufficient documentation

## 2019-12-27 ENCOUNTER — Other Ambulatory Visit: Payer: Self-pay

## 2019-12-27 ENCOUNTER — Ambulatory Visit (INDEPENDENT_AMBULATORY_CARE_PROVIDER_SITE_OTHER): Payer: Medicare Other | Admitting: Nurse Practitioner

## 2019-12-27 ENCOUNTER — Encounter (INDEPENDENT_AMBULATORY_CARE_PROVIDER_SITE_OTHER): Payer: Self-pay | Admitting: Nurse Practitioner

## 2019-12-27 VITALS — BP 159/85 | HR 84 | Ht 74.0 in | Wt 250.0 lb

## 2019-12-27 DIAGNOSIS — I10 Essential (primary) hypertension: Secondary | ICD-10-CM | POA: Diagnosis not present

## 2019-12-27 DIAGNOSIS — M86171 Other acute osteomyelitis, right ankle and foot: Secondary | ICD-10-CM | POA: Diagnosis not present

## 2019-12-27 NOTE — Progress Notes (Addendum)
Subjective:    Patient ID: Terry Vaughn, male    DOB: 12-Sep-1932, 86 y.o.   MRN: [id number] Chief Complaint  Patient presents with  . Follow-up    [facility] Follow up    The patient presents today as a follow-up from [facility].  The patient was hospitalized with an ulcer on his right lower extremity which subsequently required debridement and wound VAC placement.  The patient also underwent bilateral ABIs on 11/25/2019.  The patient had a right ABI of 1.20 and a left ABI of 1.30.  The patient had triphasic waveforms in the bilateral tibial arteries with normal digit waveforms in all of  the bilateral toes.  The patient has an upcoming follow-up with podiatry to assess the progress with wound healing.  The patient states that the discomfort is tolerable at this time.  He denies any rest pain like symptoms.  He denies any claudication symptoms prior to his debridement.  He denies any discoloration of his toes.  He denies any fever, chills, nausea, vomiting or diarrhea.   Review of Systems  Skin: Positive for wound.  Neurological: Positive for weakness.  All other systems reviewed and are negative.      Objective:   Physical Exam Vitals reviewed.  HENT:     Head: Normocephalic.  Cardiovascular:     Rate and Rhythm: Normal rate and regular rhythm.     Pulses: Decreased pulses.     Heart sounds: Normal heart sounds.  Pulmonary:     Effort: Pulmonary effort is normal.     Breath sounds: Normal breath sounds.  Musculoskeletal:        General: Signs of injury present.  Neurological:     Mental Status: He is alert and oriented to person, place, and time.     Motor: Weakness present.  Psychiatric:        Mood and Affect: Mood normal.        Behavior: Behavior normal.        Thought Content: Thought content normal.        Judgment: Judgment normal.     BP (!) 159/85   Pulse 84   Ht 6\' 2"  (1.88 m)   Wt 250 lb (113.4 kg)   BMI 32.10 kg/m   Past Medical  History:  Diagnosis Date  . Bone spur 1988   left heel  . Bursitis 1987   right shoulder  . Cellulitis 1986   right leg  . Pneumonia    noted 07/17/17 CXR   . Shingles 1959  . Urinary retention   . UTI (urinary tract infection)    sepsis E coli UTI +urine and blood cultures 07/17/17     Social History   Socioeconomic History  . Marital status: Married    Spouse name: Jeannett Senior  . Number of children: 3  . Years of education: 37  . Highest education level: Some college, no degree  Occupational History  . Not on file  Tobacco Use  . Smoking status: Never Smoker  . Smokeless tobacco: Never Used  Substance and Sexual Activity  . Alcohol use: No    Alcohol/week: 0.0 standard drinks  . Drug use: No  . Sexual activity: Not Currently  Other Topics Concern  . Not on file  Social History Narrative   Lives in [place] with wife.   Diet - regular   Exercise - none   Social Determinants of Health   Financial Resource Strain:   . Difficulty  of Paying Living Expenses:   Food Insecurity:   . Worried About Programme researcher, broadcasting/film/video in the Last Year:   . Barista in the Last Year:   Transportation Needs:   . Freight forwarder (Medical):   Marland Kitchen Lack of Transportation (Non-Medical):   Physical Activity: Unknown  . Days of Exercise per Week: 0 days  . Minutes of Exercise per Session: Not on file  Stress: No Stress Concern Present  . Feeling of Stress : Not at all  Social Connections: Unknown  . Frequency of Communication with Friends and Family: Not on file  . Frequency of Social Gatherings with Friends and Family: Not on file  . Attends Religious Services: Not on file  . Active Member of Clubs or Organizations: Not on file  . Attends Banker Meetings: Not on file  . Marital Status: Married  Catering manager Violence:   . Fear of Current or Ex-Partner:   . Emotionally Abused:   Marland Kitchen Physically Abused:   . Sexually Abused:     Past Surgical History:    Procedure Laterality Date  . APPENDECTOMY  1942  . CHOLECYSTECTOMY  1966  . EYE SURGERY  2015   catarcts extracted  . IRRIGATION AND DEBRIDEMENT FOOT Right 12/09/2019   Procedure: IRRIGATION AND DEBRIDEMENT HEEL;  Surgeon: Rosetta Posner, DPM;  Location: [facility] ORS;  Service: Podiatry;  Laterality: Right;  . JOINT REPLACEMENT     right hip 20 + years from 2019, left hip replaced 05/01/17   . MELANOMA EXCISION  04.2010   mole removal right arm  . TONSILLECTOMY AND ADENOIDECTOMY  1944  . TOTAL HIP ARTHROPLASTY  1999   right hip    Family History  Problem Relation Age of Onset  . Clotting disorder Mother   . Heart disease Father 53  . Cancer Sister        breast  . Cerebral aneurysm Brother        hemmorage  . Cancer Daughter        breast    Allergies  Allergen Reactions  . Codeine Other (See Comments)    Alters mental state       Assessment & Plan:   1. Other acute osteomyelitis of right foot (HCC) Had a discussion with the patient in regards to proceeding with angiogram versus more conservative monitoring.  The patient had a recent ABI on 11/25/2019 which indicates he should have adequate circulation for wound healing.  Because the patient just recently had his wound debrided his wound VAC placed there has not been quite enough time to see if this will allow for wound healing.  The patient wishes to continue with conservative management of his wound, therefore we will have the patient return in 6 weeks to assess the progress of his wound healing.  If it shows that his wound has not healed very much or has regressed an angiogram would be a reasonable option to ensure there is no tibial vessel disease that was not detected on ultrasound.  However if his wound does show signs of progressive healing we will continue with close follow-up monitoring.  The patient is also advised that if the wound shows signs symptoms of deterioration prior to that 6-week follow-up patient contact her  office to arrange sooner follow-up.  2. Essential hypertension Continue antihypertensive medications as already ordered, these medications have been reviewed and there are no changes at this time.     Current Outpatient Medications on File  Prior to Visit  Medication Sig Dispense Refill  . acidophilus (RISAQUAD) CAPS capsule Take 1 capsule by mouth daily.    Marland Kitchen aspirin 81 MG chewable tablet Chew 81 mg by mouth daily.    Marland Kitchen docusate sodium (COLACE) 100 MG capsule Take 100 mg by mouth daily as needed for mild constipation.    . finasteride (PROSCAR) 5 MG tablet TAKE 1 TABLET(5 MG) BY MOUTH DAILY (Patient taking differently: Take 5 mg by mouth daily. ) 90 tablet 2  . tamsulosin (FLOMAX) 0.4 MG CAPS capsule TAKE 1 CAPSULE(0.4 MG) BY MOUTH AT BEDTIME (Patient taking differently: Take 0.4 mg by mouth at bedtime. ) 90 capsule 2   No current facility-administered medications on file prior to visit.    There are no Patient Instructions on file for this visit. No follow-ups on file.   Georgiana Spinner, NP

## 2019-12-29 ENCOUNTER — Encounter (INDEPENDENT_AMBULATORY_CARE_PROVIDER_SITE_OTHER): Payer: Self-pay | Admitting: Nurse Practitioner

## 2019-12-29 ENCOUNTER — Ambulatory Visit: Payer: Medicare Other | Admitting: Podiatry

## 2020-01-11 ENCOUNTER — Encounter: Payer: Medicare Other | Admitting: Family

## 2020-01-16 ENCOUNTER — Telehealth: Payer: Self-pay | Admitting: Family

## 2020-01-16 DIAGNOSIS — L97519 Non-pressure chronic ulcer of other part of right foot with unspecified severity: Secondary | ICD-10-CM

## 2020-01-16 NOTE — Telephone Encounter (Signed)
Error

## 2020-01-16 NOTE — Telephone Encounter (Signed)
Pt son called in and stated that it has been a week and no has called about physical therapy

## 2020-01-17 ENCOUNTER — Other Ambulatory Visit: Payer: Self-pay | Admitting: Family

## 2020-01-17 DIAGNOSIS — L98499 Non-pressure chronic ulcer of skin of other sites with unspecified severity: Secondary | ICD-10-CM

## 2020-01-17 NOTE — Telephone Encounter (Signed)
Called and spoke with the Patient's son and informed him of everything below. Informed him that once we know something from home health we will call and we should know by tomorrow 01/18/20.  I will call him then.

## 2020-01-17 NOTE — Telephone Encounter (Signed)
Called podiatry and spoke with Terry Vaughn. Informed her that since we had not seen the patient in our office that Home health told us they could not take him. Patient would have to wait until his appointment with [place] next Tuesday 01/24/20.   Podiatry confirmed that Patient was seen by them 01/11/20 and he will be seen again tomorrow, 01/18/20. Inquired if the referral can be placed by them as they are currently treating this issue for the patient and they have seen him within the required 90 days for a face to face visit.  Terry Vaughn states that if we send over the referral stating what patient needed and the home health information, they can place this referral tomorrow at Patient's office visit.   Patient's son calling back in. Informed him of my conversation with Podiatry and that they state they will place the referral at face to face visit tomorrow.   Referral information from our office plus [facility] Information has been faxed to Dr Terry Vaughn office attention to Terry Vaughn/ Dr Terry Vaughn.   Will call the patient's son tomorrow at 4:00 pm to follow-up on this process.

## 2020-01-17 NOTE — Telephone Encounter (Signed)
Spoke with the patient's son to inform that referrals have been placed to [facility] wound care and Physical Therapy out patient.   Patient's son states that this needed to be a home health referral. States that he has been coordinating with Bethanie Dicker with [facility]. States that someone from [facility] informed him they had no nurses and PCP needing to take over referral.  States that we should have gotten paperwork on this last week.   Son states that he called our office last week, spoke with Bridgett, and told her what was going on. Also states that he called in [place] and it was confirmed that we had received paperwork on this mater then. Informed him that I did not see any documentation of any of this.   Patient has not been seen by our office on any of this since leaving the hospital. Patient's son states that his father has been home without wound vac or care for the last 9 days. I apologized and informed him I understand his frustration and will handle this for him. Told him that all we had to go off was the office notes and imaging in the system as no one has sent [place] any correspondence on the issue as of yesterday.   Spoke with Claris Che and had orders changed to Home Health physical therapy and wound care urgent. Went through [employer] paperwork and there is a discharge summary from [facility] that was sent Jan 13, 2020 at 4:42 pm. Our office did not receive this until yesterday and it was not marked as urgent by [facility], therefore it was placed with standard paperwork that has a turn around time of 1-2 business days.

## 2020-01-17 NOTE — Telephone Encounter (Signed)
Pt's son called and wants a call back ASAP. Please call son and not the pt or wife/

## 2020-01-17 NOTE — Telephone Encounter (Signed)
Spoke with Marijo Conception and informed her of everything going on.  She will cancel the referral for outpatient care and initiate the referrals for home health.   She is in the process of contacting multiple agencies to see who has availability to take on this patient and who can see him the soonest.

## 2020-01-17 NOTE — Telephone Encounter (Signed)
Called and spoke with the Patient's wife. Stating patient is needing physical therapy for his right foot.   Patient was seen in the hospital and then podiatry for a wound on the right foot. According to their notes Patient  should be non-weight bearing on his right foot for some time.   He is needing a referral for physical therapy and orders for wound care to help service his Wound VAC.  Wife states that podiatry told them to go through their PCP for this.   They would like the return call to be to their son:  [place], [place])  [phone]

## 2020-01-17 NOTE — Telephone Encounter (Signed)
Called [facility] and spoke with [employer].   She states that Bethanie Dicker is out of the office for the day. I then gave her the Patient's information then asked for clarification on what is going on and what is needed.   Dorathy Daft states that [facility] does not do home health themselves and usually out source to [facility] or [facility]. These facilities do not have any nurses at the moment.   Orders were placed by Dr Einar Crow III for home health nursing, physical therapy, and supplies including a wheelchair, and walker. These orders were placed 01/05/20.

## 2020-01-17 NOTE — Telephone Encounter (Signed)
Call patient/son  I am happy to place referral to wound care and also physical therapy.  Please advise son that I have not seen patient for this but happy to help and place referrals as requested    I just reread note from podiatry visit last week which stated continue to be nonweightbearing, and to have wound VAC applied 3 times a week.  Please ensure he continues follow-up with vascular as well.   Let us know if you dont hear back within a week in regards to an appointment being scheduled.

## 2020-01-18 NOTE — Telephone Encounter (Signed)
Left message to return call to Patient's son. Just calling to check in on the father and referrals.

## 2020-01-20 ENCOUNTER — Telehealth: Payer: Self-pay | Admitting: Family

## 2020-01-20 NOTE — Telephone Encounter (Signed)
Spoke with the Patient's son earlier. He states that he called podiatry and they stated that Terry Vaughn turned the Patient down due to space.   We were told this was due to Patient's insurance. Will call to inform the son.

## 2020-01-20 NOTE — Telephone Encounter (Signed)
Are they wanting to cancel their appointment witch [name] or is this in regards to the outpatient referrals?

## 2020-01-20 NOTE — Telephone Encounter (Signed)
Spoke with the patient's son to inform that father was declined due to insurance. Patient's son asked what was to be done now.   I informed the Patient's son that his father's only option for wound care at the moment was outpatient [facility] referral. Spoke with Terry Vaughn and had her re-open this referral urgently. She also sent them a message and we may hear from them on Monday.   Patient has an appointment set to see Terry Vaughn on Tuesday of next week. Stressed to him how important it is to keep this appointment. Patient's son was upset as his father has now gone almost 2 weeks without home care. I apologized to the son as I understand that this should not have happened the way it has for as long as it has.   We re-hashed the events of this past week. Patient was sent home from [facility] and they originally placed the Home Health orders for P.T and Wound care. Their referral was denied but our office was not notified. We then received his discharge summary this past Monday but was unaware he needed anything else at the time. I have been corresponding with the Patient's son, Terry Vaughn, and Podiatry since Tuesday trying to get the orders through.  Podiatry's orders for [facility] were placed on Wednesday and son called in today(podiatry) to find that these were declined as well.   Terry Vaughn called Terry Vaughn today and was informed this was because of Patient's insurance. She had spoken with someone this past Tuesday and was informed they had availability. She has asked to speak with someone who may be able to help regarding this issue. Terry Vaughn states they will call her back.  Informed the Patient's son that we will have to do Outpatient wound care for now as it seems Home health is going to be a lengthy process and his father can no longer wait. Informed him that we have re-opened this order and could hear back as early as Monday. I told him that we could get him Home Health P.T as the cause of  denial is mainly nursing/wound care. Patient's son was happy to hear this however upset that this may take some time. I informed him that once the Patient is seen this coming Tuesday we can put those orders in and call to attempt to have Patient seen sooner.   Informed the Patient's son that I would take point on this as he felt the ball was continually dropped. I apologized about all of the back and forth and informed him that what was holding our office up is that the Patient had not been seen. Patient was offered a hospital follow up with [facility] but this had been declined. Our office was involved late in the process but now we will ensure this is taken care of.    Patient's son verbalized understanding, stating they were focused on getting him to podiatry. They will keep the appointment on this coming Tuesday. Informed him that I would keep him posted and although I may not have any news on Monday, I will defiantly have updates for him at their appointment.   APT: 01/24/20 at 8:30 am.

## 2020-01-20 NOTE — Telephone Encounter (Signed)
See previous encounter from today.

## 2020-01-20 NOTE — Telephone Encounter (Signed)
Ok. Thanks!

## 2020-01-20 NOTE — Telephone Encounter (Signed)
I called [name] regarding availability  waiting for Terry Vaughn from [place] to call me.

## 2020-01-20 NOTE — Telephone Encounter (Signed)
Patient's son called back in yesterday. States that podiatry put in the referral and also did a skin graft on patient's wound. They will keep the in person appointment with [name] for this coming Tuesday.   They are currently awaiting a call from home health.

## 2020-01-20 NOTE — Telephone Encounter (Signed)
  Do we need to cal him again?

## 2020-01-20 NOTE — Telephone Encounter (Signed)
Patient's son called back and stated they are not going to make an appt. He stated they're looking into [facility] to have them come out to see patient at home. msg from [facility] wound care

## 2020-01-20 NOTE — Telephone Encounter (Signed)
Pt returning call can reach him at [phone]

## 2020-01-23 ENCOUNTER — Telehealth: Payer: Self-pay | Admitting: Family

## 2020-01-23 NOTE — Telephone Encounter (Signed)
Wound care center call about pt  She said she was a little confuse about something's please call at [phone]

## 2020-01-24 ENCOUNTER — Encounter: Payer: Self-pay | Admitting: Family

## 2020-01-24 ENCOUNTER — Other Ambulatory Visit: Payer: Self-pay

## 2020-01-24 ENCOUNTER — Ambulatory Visit (INDEPENDENT_AMBULATORY_CARE_PROVIDER_SITE_OTHER): Payer: Medicare Other | Admitting: Family

## 2020-01-24 VITALS — BP 122/62 | HR 77 | Temp 87.5°F | Ht 74.02 in

## 2020-01-24 DIAGNOSIS — L97511 Non-pressure chronic ulcer of other part of right foot limited to breakdown of skin: Secondary | ICD-10-CM | POA: Diagnosis not present

## 2020-01-24 DIAGNOSIS — L97519 Non-pressure chronic ulcer of other part of right foot with unspecified severity: Secondary | ICD-10-CM | POA: Diagnosis not present

## 2020-01-24 DIAGNOSIS — I10 Essential (primary) hypertension: Secondary | ICD-10-CM | POA: Diagnosis not present

## 2020-01-24 NOTE — Assessment & Plan Note (Signed)
Well controlled, not on medication. Advised patient and son to spot check at home to ensure stays less than 130/80

## 2020-01-24 NOTE — Progress Notes (Signed)
Barkley BoardsGENTRY, Tel E. ([id number]) Visit Report for 12/07/2019 Chief Complaint Document Details Patient Name: Barkley BoardsGENTRY, Izayiah E. Date of Service: 12/07/2019 1:00 PM Medical Record Number: [id number] Patient Account Number: [id number] Date of Birth/Sex: 01/12/1933 (86 y.o. M) Treating RN: Huel CoventryWoody, Kim Primary Care Provider: Rennie PlowmanARNETT, MARGARET Other Clinician: Referring Provider: Nicholes RoughPATEL, KEVIN Treating Provider/Extender: Altamese CarolinaOBSON, MICHAEL G Weeks in Treatment: 0 Information Obtained from: Patient Chief Complaint 12/07/2019; patient is here for review of the wound on the right plantar calcaneus Electronic Signature(s) Signed: 12/08/2019 7:53:18 AM By: Baltazar Najjarobson, Michael MD Entered By: Baltazar Najjarobson, Michael on 12/07/2019 14:03:09 Barkley BoardsGENTRY, Terrick E. ([id number]) -------------------------------------------------------------------------------- HPI Details Patient Name: Barkley BoardsGENTRY, Suhaan E. Date of Service: 12/07/2019 1:00 PM Medical Record Number: [id number] Patient Account Number: [id number] Date of Birth/Sex: 03/31/1933 (86 y.o. M) Treating RN: Huel CoventryWoody, Kim Primary Care Provider: Rennie PlowmanARNETT, MARGARET Other Clinician: Referring Provider: Nicholes RoughPATEL, KEVIN Treating Provider/Extender: Altamese CarolinaOBSON, MICHAEL G Weeks in Treatment: 0 History of Present Illness HPI Description: ADMISSION 12/07/2019 This is an 86 year old independent man who is the caregiver for is more disabled wife at home. She tells us that he developed a wound on his right heel in March. He is not exactly sure how this happened. Initially a small wound which expanded. He has been followed by Dr. Allena KatzPatel of podiatry for several visits from 4/12 through 12/01/2019 using Betadine wet-to-dry. At 1 point early on this wound was described as having 0.3 cm of depth. An x-ray was listed as being negative. He describes this area is increasingly painful. He had arterial studies on 4/38/21 this showed a ABI on the right of 1.20 and on the left at 1.30 with triphasic waveforms bilaterally. His  TBI's could not be done I think having something to do with the size of the toe cuff. He has not been on antibiotics he simply had a gauze dressing when he came in. Past medical history includes PAD and a bone spur on the left heel. Patient is independent at home in fact he is the caregiver for his disabled wife who apparently recently had a stroke Electronic Signature(s) Signed: 12/08/2019 7:53:18 AM By: Baltazar Najjarobson, Michael MD Entered By: Baltazar Najjarobson, Michael on 12/07/2019 14:05:28 Barkley BoardsGENTRY, Kyree E. ([id number]) -------------------------------------------------------------------------------- Physical Exam Details Patient Name: Barkley BoardsGENTRY, Dahmir E. Date of Service: 12/07/2019 1:00 PM Medical Record Number: [id number] Patient Account Number: [id number] Date of Birth/Sex: 08/24/1932 (86 y.o. M) Treating RN: Huel CoventryWoody, Kim Primary Care Provider: Rennie PlowmanARNETT, MARGARET Other Clinician: Referring Provider: Nicholes RoughPATEL, KEVIN Treating Provider/Extender: Altamese CarolinaOBSON, MICHAEL G Weeks in Treatment: 0 Constitutional Patient is hypertensive.. Pulse regular and within target range for patient.Marland Kitchen. Respirations regular, non-labored and within target range.. Patient is febrile today.Marland Kitchen. appears in no distress. Cardiovascular Pedal pulses palpable at both the dorsalis pedis and posterior tibial.. Musculoskeletal Skin on the plantar heel is macerated discolored. Very tender. Notes Wound exam; wound is on the plantar right heel. Macerated skin around the wound. Very malodorous. In the center of this wound is a small probing area that goes straight to bone also worrisome substantial undermining is likely. Specimen obtained for culture Electronic Signature(s) Signed: 12/08/2019 7:53:18 AM By: Baltazar Najjarobson, Michael MD Entered By: Baltazar Najjarobson, Michael on 12/07/2019 14:07:09 Barkley BoardsGENTRY, Elmond E. ([id number]) -------------------------------------------------------------------------------- Physician Orders Details Patient Name: Barkley BoardsGENTRY, Kyser E. Date of Service:  12/07/2019 1:00 PM Medical Record Number: [id number] Patient Account Number: [id number] Date of Birth/Sex: 01/03/1933 (86 y.o. M) Treating RN: Huel CoventryWoody, Kim Primary Care Provider: Rennie PlowmanARNETT, MARGARET Other Clinician: Referring Provider: Nicholes RoughPATEL, KEVIN Treating Provider/Extender: Altamese CarolinaOBSON, MICHAEL G Weeks in Treatment: 0 Verbal / Phone Orders:  No Diagnosis Coding Wound Cleansing Wound #1 Right Calcaneus o May shower with protection. Anesthetic (add to Medication List) Wound #1 Right Calcaneus o Topical Lidocaine 4% cream applied to wound bed prior to debridement (In Clinic Only). Primary Wound Dressing Wound #1 Right Calcaneus o Silver Alginate Secondary Dressing Wound #1 Right Calcaneus o ABD and Kerlix/Conform Dressing Change Frequency Wound #1 Right Calcaneus o Change dressing every other day. Follow-up Appointments Wound #1 Right Calcaneus o Return Appointment in 1 week. Edema Control Wound #1 Right Calcaneus o Elevate legs to the level of the heart and pump ankles as often as possible Off-Loading Wound #1 Right Calcaneus o Open toe surgical shoe Additional Orders / Instructions Wound #1 Right Calcaneus o Activity as tolerated Laboratory o Bacteria identified in Wound by Culture (MICRO) - Right heel oooo LOINC Code: 6462-6 oooo Convenience Name: Wound culture routine Radiology o MRI, lower extremity with contast - Right heel ABDULAI, BLAYLOCK ([id number]) Notes Patients to go to [facility] Emergency Room for assessment and IV antibiotics. Electronic Signature(s) Signed: 12/08/2019 7:53:18 AM By: Baltazar Najjar MD Signed: 01/24/2020 12:53:52 PM By: Elliot Gurney, BSN, RN, CWS, Kim RN, BSN Entered By: Elliot Gurney, BSN, RN, CWS, Kim on 12/07/2019 13:40:01 ANAV, LAMMERT ([id number]) -------------------------------------------------------------------------------- Problem List Details Patient Name: DACODA, FINLAY. Date of Service: 12/07/2019 1:00 PM Medical Record  Number: [id number] Patient Account Number: [id number] Date of Birth/Sex: 12-21-1932 (86 y.o. M) Treating RN: Huel Coventry Primary Care Provider: Rennie Plowman Other Clinician: Referring Provider: Nicholes Rough Treating Provider/Extender: Altamese [place] in Treatment: 0 Active Problems ICD-10 Encounter Code Description Active Date MDM Diagnosis L89.614 Pressure ulcer of right heel, stage 4 12/07/2019 No Yes L03.115 Cellulitis of right lower limb 12/07/2019 No Yes Inactive Problems Resolved Problems Electronic Signature(s) Signed: 12/08/2019 7:53:18 AM By: Baltazar Najjar MD Entered By: Baltazar Najjar on 12/07/2019 14:02:35 [name] ([id number]) -------------------------------------------------------------------------------- Progress Note Details Patient Name: EUGENIO, DOLLINS. Date of Service: 12/07/2019 1:00 PM Medical Record Number: [id number] Patient Account Number: [id number] Date of Birth/Sex: 1933-05-30 (86 y.o. M) Treating RN: Huel Coventry Primary Care Provider: Rennie Plowman Other Clinician: Referring Provider: Nicholes Rough Treating Provider/Extender: Altamese [place] in Treatment: 0 Subjective Chief Complaint Information obtained from Patient 12/07/2019; patient is here for review of the wound on the right plantar calcaneus History of Present Illness (HPI) ADMISSION 12/07/2019 This is an 86 year old independent man who is the caregiver for is more disabled wife at home. She tells [place] that he developed a wound on his right heel in March. He is not exactly sure how this happened. Initially a small wound which expanded. He has been followed by Dr. Allena Katz of podiatry for several visits from 4/12 through 12/01/2019 using Betadine wet-to-dry. At 1 point early on this wound was described as having 0.3 cm of depth. An x-ray was listed as being negative. He describes this area is increasingly painful. He had arterial studies on 4/38/21 this showed a ABI on the right of  1.20 and on the left at 1.30 with triphasic waveforms bilaterally. His TBI's could not be done I think having something to do with the size of the toe cuff. He has not been on antibiotics he simply had a gauze dressing when he came in. Past medical history includes PAD and a bone spur on the left heel. Patient is independent at home in fact he is the caregiver for his disabled wife who apparently recently had a stroke Patient History Allergies No Known Allergies Family  History Stroke - Siblings, No family history of Cancer, Diabetes, Heart Disease, Hereditary Spherocytosis, Hypertension, Kidney Disease, Lung Disease, Seizures, Thyroid Problems, Tuberculosis. Social History Never smoker, Marital Status - Married, Alcohol Use - Never, Drug Use - No History, Caffeine Use - Never. Medical History Eyes Patient has history of Cataracts Ear/Nose/Mouth/Throat Denies history of Chronic sinus problems/congestion, Middle ear problems Hematologic/Lymphatic Denies history of Anemia, Hemophilia, Human Immunodeficiency Virus, Lymphedema, Sickle Cell Disease Respiratory Denies history of Aspiration, Asthma, Chronic Obstructive Pulmonary Disease (COPD), Pneumothorax, Sleep Apnea, Tuberculosis Cardiovascular Denies history of Angina, Arrhythmia, Congestive Heart Failure, Coronary Artery Disease, Deep Vein Thrombosis, Hypertension, Hypotension, Myocardial Infarction, Peripheral Arterial Disease, Peripheral Venous Disease, Phlebitis, Vasculitis Gastrointestinal Denies history of Cirrhosis , Colitis, Crohn s, Hepatitis A, Hepatitis B, Hepatitis C Endocrine Denies history of Type I Diabetes, Type II Diabetes Genitourinary Denies history of End Stage Renal Disease Immunological Denies history of Lupus Erythematosus, Raynaud s, Scleroderma Integumentary (Skin) Denies history of History of Burn, History of pressure wounds Musculoskeletal Denies history of Gout, Rheumatoid Arthritis, Osteoarthritis,  Osteomyelitis Neurologic Denies history of Dementia, Neuropathy, Quadriplegia, Paraplegia, Seizure Disorder Oncologic Denies history of Received Chemotherapy, Received Radiation Psychiatric Denies history of Anorexia/bulimia, Confinement Anxiety BRAYLEN, STALLER ([id number]) Review of Systems (ROS) Constitutional Symptoms (General Health) Denies complaints or symptoms of Fatigue, Fever, Chills, Marked Weight Change. Eyes Denies complaints or symptoms of Dry Eyes, Vision Changes, Glasses / Contacts. Ear/Nose/Mouth/Throat Denies complaints or symptoms of Difficult clearing ears, Sinusitis. Hematologic/Lymphatic Denies complaints or symptoms of Bleeding / Clotting Disorders, Human Immunodeficiency Virus. Respiratory Denies complaints or symptoms of Chronic or frequent coughs, Shortness of Breath. Cardiovascular Denies complaints or symptoms of Chest pain, LE edema. Gastrointestinal Denies complaints or symptoms of Frequent diarrhea, Nausea, Vomiting. Endocrine Denies complaints or symptoms of Hepatitis, Thyroid disease, Polydypsia (Excessive Thirst). Genitourinary Denies complaints or symptoms of Kidney failure/ Dialysis, Incontinence/dribbling. Immunological Denies complaints or symptoms of Hives, Itching. Integumentary (Skin) Complains or has symptoms of Wounds. Denies complaints or symptoms of Bleeding or bruising tendency, Breakdown, Swelling. Musculoskeletal Denies complaints or symptoms of Muscle Pain, Muscle Weakness. Neurologic Denies complaints or symptoms of Numbness/parasthesias, Focal/Weakness. Psychiatric Denies complaints or symptoms of Anxiety, Claustrophobia. Objective Constitutional Patient is hypertensive.. Pulse regular and within target range for patient.Marland Kitchen Respirations regular, non-labored and within target range.. Patient is febrile today.Marland Kitchen appears in no distress. Vitals Time Taken: 1:00 PM, Height: 74 in, Source: Stated, Weight: 254 lbs, Source: Measured,  BMI: 32.6, Temperature: 99.8 F, Pulse: 93 bpm, Respiratory Rate: 18 breaths/min, Blood Pressure: 157/79 mmHg. Cardiovascular Pedal pulses palpable at both the dorsalis pedis and posterior tibial.. Musculoskeletal Skin on the plantar heel is macerated discolored. Very tender. General Notes: Wound exam; wound is on the plantar right heel. Macerated skin around the wound. Very malodorous. In the center of this wound is a small probing area that goes straight to bone also worrisome substantial undermining is likely. Specimen obtained for culture Integumentary (Hair, Skin) Wound #1 status is Open. Original cause of wound was Gradually Appeared. The wound is located on the Right Calcaneus. The wound measures 2.2cm length x 2.6cm width x 1.8cm depth; 4.492cm^2 area and 8.086cm^3 volume. There is Fat Layer (Subcutaneous Tissue) Exposed exposed. There is no tunneling or undermining noted. There is a medium amount of serosanguineous drainage noted. There is medium (34-66%) red granulation within the wound bed. There is a medium (34-66%) amount of necrotic tissue within the wound bed including Adherent Slough. Assessment Active Problems ICD-10 Pressure ulcer of right heel, stage 4  Cellulitis of right lower limb KWASI, JOUNG ([id number]) Plan Wound Cleansing: Wound #1 Right Calcaneus: May shower with protection. Anesthetic (add to Medication List): Wound #1 Right Calcaneus: Topical Lidocaine 4% cream applied to wound bed prior to debridement (In Clinic Only). Primary Wound Dressing: Wound #1 Right Calcaneus: Silver Alginate Secondary Dressing: Wound #1 Right Calcaneus: ABD and Kerlix/Conform Dressing Change Frequency: Wound #1 Right Calcaneus: Change dressing every other day. Follow-up Appointments: Wound #1 Right Calcaneus: Return Appointment in 1 week. Edema Control: Wound #1 Right Calcaneus: Elevate legs to the level of the heart and pump ankles as often as  possible Off-Loading: Wound #1 Right Calcaneus: Open toe surgical shoe Additional Orders / Instructions: Wound #1 Right Calcaneus: Activity as tolerated Laboratory ordered were: Wound culture routine - Right heel Radiology ordered were: MRI, lower extremity with contast - Right heel General Notes: Patients to go to [facility] Emergency Room for assessment and IV antibiotics. 1. Patient with a very deep wound on the right plantar heel with coexistent substantial infection. I did a swab culture for CandS 2. Substantial maceration very malodorous. Wound probes to bone I think with substantial undermining towards the middle part of his foot. All of this is worse than what was previously described in Dr. Eliane Decree notes. 3. I think this man is probably going to require IV antibiotics and an MRI I have therefore sent him to the ER. Initially he refused this citing his disabled wife for which she is a caregiver however I told the patient and his son I think he needs IV antibiotics at least to start. An MRI to determine the extent of the soft tissue and/or bone infection. It is likely he will require IV antibiotics at home but he might be able to transition to oral antibiotics if there is not osteomyelitis. 4. I emphasized that after he gets home he is going to find have to find a way to offload this wound. It is unlikely that he is capable of being a caregiver at this point for his more disabled wife and I discussed this with the patient and his son Electronic Signature(s) Signed: 12/08/2019 7:53:18 AM By: Baltazar Najjar MD Entered By: Baltazar Najjar on 12/07/2019 14:09:48 [name] ([id number]) -------------------------------------------------------------------------------- ROS/PFSH Details Patient Name: ANGELGABRIEL, WILLMORE. Date of Service: 12/07/2019 1:00 PM Medical Record Number: [id number] Patient Account Number: [id number] Date of Birth/Sex: 03/06/1933 (86 y.o. M) Treating RN: Rodell Perna Primary Care Provider: Rennie Plowman Other Clinician: Referring Provider: Nicholes Rough Treating Provider/Extender: Altamese [place] in Treatment: 0 Constitutional Symptoms (General Health) Complaints and Symptoms: Negative for: Fatigue; Fever; Chills; Marked Weight Change Eyes Complaints and Symptoms: Negative for: Dry Eyes; Vision Changes; Glasses / Contacts Medical History: Positive for: Cataracts Ear/Nose/Mouth/Throat Complaints and Symptoms: Negative for: Difficult clearing ears; Sinusitis Medical History: Negative for: Chronic sinus problems/congestion; Middle ear problems Hematologic/Lymphatic Complaints and Symptoms: Negative for: Bleeding / Clotting Disorders; Human Immunodeficiency Virus Medical History: Negative for: Anemia; Hemophilia; Human Immunodeficiency Virus; Lymphedema; Sickle Cell Disease Respiratory Complaints and Symptoms: Negative for: Chronic or frequent coughs; Shortness of Breath Medical History: Negative for: Aspiration; Asthma; Chronic Obstructive Pulmonary Disease (COPD); Pneumothorax; Sleep Apnea; Tuberculosis Cardiovascular Complaints and Symptoms: Negative for: Chest pain; LE edema Medical History: Negative for: Angina; Arrhythmia; Congestive Heart Failure; Coronary Artery Disease; Deep Vein Thrombosis; Hypertension; Hypotension; Myocardial Infarction; Peripheral Arterial Disease; Peripheral Venous Disease; Phlebitis; Vasculitis Gastrointestinal Complaints and Symptoms: Negative for: Frequent diarrhea; Nausea; Vomiting Medical History: Negative for: Cirrhosis ; Colitis; Crohnos;  Hepatitis A; Hepatitis B; Hepatitis C Endocrine Complaints and Symptoms: Negative for: Hepatitis; Thyroid disease; Polydypsia (Excessive Thirst) Medical History: Negative for: Type I Diabetes; Type II Diabetes VADA, YELLEN ([id number]) Genitourinary Complaints and Symptoms: Negative for: Kidney failure/ Dialysis; Incontinence/dribbling Medical  History: Negative for: End Stage Renal Disease Immunological Complaints and Symptoms: Negative for: Hives; Itching Medical History: Negative for: Lupus Erythematosus; Raynaudos; Scleroderma Integumentary (Skin) Complaints and Symptoms: Positive for: Wounds Negative for: Bleeding or bruising tendency; Breakdown; Swelling Medical History: Negative for: History of Burn; History of pressure wounds Musculoskeletal Complaints and Symptoms: Negative for: Muscle Pain; Muscle Weakness Medical History: Negative for: Gout; Rheumatoid Arthritis; Osteoarthritis; Osteomyelitis Neurologic Complaints and Symptoms: Negative for: Numbness/parasthesias; Focal/Weakness Medical History: Negative for: Dementia; Neuropathy; Quadriplegia; Paraplegia; Seizure Disorder Psychiatric Complaints and Symptoms: Negative for: Anxiety; Claustrophobia Medical History: Negative for: Anorexia/bulimia; Confinement Anxiety Oncologic Medical History: Negative for: Received Chemotherapy; Received Radiation HBO Extended History Items Eyes: Cataracts Immunizations Pneumococcal Vaccine: Received Pneumococcal Vaccination: Yes Implantable Devices None Family and Social History Cancer: No; Diabetes: No; Heart Disease: No; Hereditary Spherocytosis: No; Hypertension: No; Kidney Disease: No; Lung Disease: No; Seizures: No; Stroke: Yes - Siblings; Thyroid Problems: No; Tuberculosis: No; Never smoker; Marital Status - Married; Alcohol Use: Never; Drug ABDISHAKUR, GOTTSCHALL ([id number]) Use: No History; Caffeine Use: Never; Financial Concerns: No; Food, Clothing or Shelter Needs: No; Support System Lacking: No; Transportation Concerns: No Electronic Signature(s) Signed: 12/08/2019 7:53:18 AM By: Baltazar Najjar MD Signed: 12/08/2019 11:19:24 AM By: Rodell Perna Entered By: Rodell Perna on 12/07/2019 13:17:31 [name] ([id number]) -------------------------------------------------------------------------------- SuperBill  Details Patient Name: RANSOM, NICKSON. Date of Service: 12/07/2019 Medical Record Number: [id number] Patient Account Number: [id number] Date of Birth/Sex: [birth year] (86 y.o. M) Treating RN: Huel Coventry Primary Care Provider: Rennie Plowman Other Clinician: Referring Provider: Nicholes Rough Treating Provider/Extender: Altamese [place] in Treatment: 0 Diagnosis Coding ICD-10 Codes Code Description L89.614 Pressure ulcer of right heel, stage 4 L03.115 Cellulitis of right lower limb Facility Procedures CPT4 Code: [id number] Description: [phone] - WOUND CARE VISIT-LEV 5 EST PT Modifier: Quantity: 1 Physician Procedures CPT4 Code: [id number] Description: WC PHYS LEVEL 3 o NEW PT Modifier: Quantity: 1 CPT4 Code: Description: ICD-10 Diagnosis Description L89.614 Pressure ulcer of right heel, stage 4 L03.115 Cellulitis of right lower limb Modifier: Quantity: Electronic Signature(s) Signed: 12/08/2019 7:53:18 AM By: Baltazar Najjar MD Entered By: Baltazar Najjar on 12/07/2019 14:10:52

## 2020-01-24 NOTE — Progress Notes (Signed)
Terry Vaughn, Terry E. ([id number]) Visit Report for 12/07/2019 Allergy List Details Patient Name: Terry Vaughn, Terry E. Date of Service: 12/07/2019 1:00 PM Medical Record Number: [id number] Patient Account Number: [id number] Date of Birth/Sex: 03/14/1933 (86 y.o. M) Treating RN: Rodell PernaScott, Dajea Primary Care Ronnie Doo: Rennie PlowmanARNETT, MARGARET Other Clinician: Referring Vonita Calloway: Nicholes RoughPATEL, KEVIN Treating Roderic Lammert/Extender: Maxwell CaulOBSON, MICHAEL G Weeks in Treatment: 0 Allergies Active Allergies No Known Allergies Allergy Notes Electronic Signature(s) Signed: 12/08/2019 11:19:24 AM By: Rodell PernaScott, Dajea Entered By: Rodell PernaScott, Dajea on 12/07/2019 13:15:22 Terry Vaughn, Terry E. ([id number]) -------------------------------------------------------------------------------- Arrival Information Details Patient Name: Terry Vaughn, Terry E. Date of Service: 12/07/2019 1:00 PM Medical Record Number: [id number] Patient Account Number: [id number] Date of Birth/Sex: 12/11/1932 (86 y.o. M) Treating RN: Huel CoventryWoody, Kim Primary Care Season Astacio: Rennie PlowmanARNETT, MARGARET Other Clinician: Referring Dene Landsberg: Nicholes RoughPATEL, KEVIN Treating Chief Walkup/Extender: Altamese CarolinaOBSON, MICHAEL G Weeks in Treatment: 0 Visit Information Patient Arrived: Cane Arrival Time: 13:02 Accompanied By: son Transfer Assistance: None Patient Identification Verified: Yes Secondary Verification Process Completed: Yes Electronic Signature(s) Signed: 12/07/2019 4:54:55 PM By: Dayton MartesWallace, RCP,RRT,CHT, Sallie RCP, RRT, CHT Entered By: Dayton MartesWallace, RCP,RRT,CHT, Sallie on 12/07/2019 13:02:47 Terry Vaughn, Terry E. ([id number]) -------------------------------------------------------------------------------- Clinic Level of Care Assessment Details Patient Name: Terry Vaughn, Terry E. Date of Service: 12/07/2019 1:00 PM Medical Record Number: [id number] Patient Account Number: [id number] Date of Birth/Sex: 09/19/1932 (86 y.o. M) Treating RN: Huel CoventryWoody, Kim Primary Care Jyaire Koudelka: Rennie PlowmanARNETT, MARGARET Other Clinician: Referring Zakariyya Helfman: Nicholes RoughPATEL,  KEVIN Treating Jaskaran Dauzat/Extender: Altamese CarolinaOBSON, MICHAEL G Weeks in Treatment: 0 Clinic Level of Care Assessment Items TOOL 2 Quantity Score []  - Use when only an EandM is performed on the INITIAL visit 0 ASSESSMENTS - Nursing Assessment / Reassessment X - General Physical Exam (combine w/ comprehensive assessment (listed just below) when performed on new 1 20 pt. evals) X- 1 25 Comprehensive Assessment (HX, ROS, Risk Assessments, Wounds Hx, etc.) ASSESSMENTS - Wound and Skin Assessment / Reassessment X - Simple Wound Assessment / Reassessment - one wound 1 5 []  - 0 Complex Wound Assessment / Reassessment - multiple wounds []  - 0 Dermatologic / Skin Assessment (not related to wound area) ASSESSMENTS - Ostomy and/or Continence Assessment and Care []  - Incontinence Assessment and Management 0 []  - 0 Ostomy Care Assessment and Management (repouching, etc.) PROCESS - Coordination of Care X - Simple Patient / Family Education for ongoing care 1 15 []  - 0 Complex (extensive) Patient / Family Education for ongoing care X- 1 10 Staff obtains ChiropractorConsents, Records, Test Results / Process Orders []  - 0 Staff telephones HHA, Nursing Homes / Clarify orders / etc []  - 0 Routine Transfer to another Facility (non-emergent condition) []  - 0 Routine Hospital Admission (non-emergent condition) X- 1 15 New Admissions / Manufacturing engineernsurance Authorizations / Ordering NPWT, Apligraf, etc. X- 1 20 Emergency Hospital Admission (emergent condition) X- 1 10 Simple Discharge Coordination []  - 0 Complex (extensive) Discharge Coordination PROCESS - Special Needs []  - Pediatric / Minor Patient Management 0 []  - 0 Isolation Patient Management []  - 0 Hearing / Language / Visual special needs []  - 0 Assessment of Community assistance (transportation, D/C planning, etc.) []  - 0 Additional assistance / Altered mentation []  - 0 Support Surface(s) Assessment (bed, cushion, seat, etc.) INTERVENTIONS - Wound Cleansing /  Measurement X - Wound Imaging (photographs - any number of wounds) 1 5 []  - 0 Wound Tracing (instead of photographs) X- 1 5 Simple Wound Measurement - one wound []  - 0 Complex Wound Measurement - multiple wounds Terry Vaughn, Terry E. ([id number]) X- 1 5 Simple Wound Cleansing - one wound []  -  0 Complex Wound Cleansing - multiple wounds INTERVENTIONS - Wound Dressings []  - Small Wound Dressing one or multiple wounds 0 []  - 0 Medium Wound Dressing one or multiple wounds X- 1 20 Large Wound Dressing one or multiple wounds []  - 0 Application of Medications - injection INTERVENTIONS - Miscellaneous []  - External ear exam 0 X- 1 5 Specimen Collection (cultures, biopsies, blood, body fluids, etc.) X- 1 5 Specimen(s) / Culture(s) sent or taken to Lab for analysis []  - 0 Patient Transfer (multiple staff / / Similar devices) []  - 0 Simple Staple / Suture removal (25 or less) []  - 0 Complex Staple / Suture removal (26 or more) []  - 0 Hypo / Hyperglycemic Management (close monitor of Blood Glucose) []  - 0 Ankle / Brachial Index (ABI) - do not check if billed separately Has the patient been seen at the hospital within the last three years: Yes Total Score: 165 Level Of Care: New/Established - Level 5 Electronic Signature(s) Signed: 01/24/2020 12:53:52 PM By: , BSN, RN, CWS, Kim RN, BSN Entered By: , BSN, RN, CWS, Kim on 12/07/2019 13:41:10 Nurse, adult ( ) -------------------------------------------------------------------------------- Lower Extremity Assessment Details Patient Name: Terry Vaughn, FIGG. Date of Service: 12/07/2019 1:00 PM Medical Record Number: Patient Account Number: 01/26/2020 Date of Birth/Sex: 11/08/1932 (86 y.o. M) Treating RN: 02/06/2020 Primary Care Julyana Woolverton: Terry Boards Other Clinician: Referring Lashika Erker: [id number] Treating Latice Waitman/Extender: Terry Boards Weeks in Treatment: 0 Edema Assessment Assessed:  [Left: No] [Right: No] Edema: [Left: No] [Right: No] Calf Left: Right: Point of Measurement: 37 cm From Medial Instep 43 cm 40 cm Ankle Left: Right: Point of Measurement: 11 cm From Medial Instep 26 cm 31 cm Vascular Assessment Pulses: Dorsalis Pedis Palpable: [Left:Yes] [Right:Yes] Posterior Tibial Palpable: [Left:Yes] [Right:Yes] Electronic Signature(s) Signed: 12/08/2019 11:19:24 AM By: [id number] Entered By: [id number] on 12/07/2019 13:15:11 85 (Rodell Perna) -------------------------------------------------------------------------------- Multi Wound Chart Details Patient Name: Terry Vaughn, GUNDERSON. Date of Service: 12/07/2019 1:00 PM Medical Record Number: Maxwell Caul Patient Account Number: 12/10/2019 Date of Birth/Sex: November 01, 1932 (86 y.o. M) Treating RN: 02/06/2020 Primary Care Fuller Makin: Terry Boards Other Clinician: Referring Lili Harts: [phone] Treating Zaydah Nawabi/Extender: Terry Boards in Treatment: 0 Vital Signs Height(in): 74 Pulse(bpm): 93 Weight(lbs): 254 Blood Pressure(mmHg): 157/79 Body Mass Index(BMI): 33 Temperature(F): 99.8 Respiratory Rate(breaths/min): 18 Photos: [N/A:N/A] Wound Location: Right Calcaneus N/A N/A Wounding Event: Gradually Appeared N/A N/A Primary Etiology: Pressure Ulcer N/A N/A Date Acquired: 09/26/2019 N/A N/A Weeks of Treatment: 0 N/A N/A Wound Status: Open N/A N/A Measurements L x W x D (cm) 2.2x2.6x1.8 N/A N/A Area (cm) : 4.492 N/A N/A Volume (cm) : 8.086 N/A N/A Classification: Category/Stage II N/A N/A Exudate Amount: Medium N/A N/A Exudate Type: Serosanguineous N/A N/A Exudate Color: red, brown N/A N/A Granulation Amount: Medium (34-66%) N/A N/A Granulation Quality: Red N/A N/A Necrotic Amount: Medium (34-66%) N/A N/A Exposed Structures: Fat Layer (Subcutaneous Tissue) N/A N/A Exposed: Yes Fascia: No Tendon: No Muscle: No Joint: No Bone: No Epithelialization: None N/A N/A Treatment  Notes Wound #1 (Right Calcaneus) 1. Cleansed with: Clean wound with Normal Saline May Shower, gently pat wound dry prior to applying new dressing. 2. Anesthetic Topical Lidocaine 4% cream to wound bed prior to debridement 4. Dressing Applied: Other dressing (specify in notes) 5. Secondary Dressing Applied ABD Pad ALAND, CHESTNUTT (12/27/1932) Electronic Signature(s) Signed: 12/08/2019 7:53:18 AM By: Huel Coventry MD Entered By: Rennie Plowman on 12/07/2019 14:02:48 Altamese Holt (11/26/2019) --------------------------------------------------------------------------------  Multi-Disciplinary Care Plan Details Patient Name: RAYBON, CONARD. Date of Service: 12/07/2019 1:00 PM Medical Record Number: [id number] Patient Account Number: [id number] Date of Birth/Sex: 1933/03/19 (86 y.o. M) Treating RN: Huel Coventry Primary Care Alvina Strother: Rennie Plowman Other Clinician: Referring Kain Milosevic: Nicholes Rough Treating Shloima Clinch/Extender: Altamese [place] in Treatment: 0 Active Inactive Electronic Signature(s) Signed: 12/27/2019 5:48:53 PM By: Elliot Gurney, BSN, RN, CWS, Kim RN, BSN Entered By: Elliot Gurney, BSN, RN, CWS, Kim on 12/27/2019 17:48:53 Terry Boards ([id number]) -------------------------------------------------------------------------------- Pain Assessment Details Patient Name: LUCIUS, WISE. Date of Service: 12/07/2019 1:00 PM Medical Record Number: [id number] Patient Account Number: [id number] Date of Birth/Sex: 1933-03-25 (86 y.o. M) Treating RN: Huel Coventry Primary Care Avyay Coger: Rennie Plowman Other Clinician: Referring Leldon Steege: Nicholes Rough Treating Brenisha Tsui/Extender: Altamese [place] in Treatment: 0 Active Problems Location of Pain Severity and Description of Pain Patient Has Paino Yes Site Locations Rate the pain. Current Pain Level: 7 Pain Management and Medication Current Pain Management: Electronic Signature(s) Signed: 12/07/2019 4:54:55 PM By: Dayton Martes RCP, RRT, CHT Signed: 01/24/2020 12:53:52 PM By: Elliot Gurney, BSN, RN, CWS, Kim RN, BSN Entered By: Dayton Martes on 12/07/2019 13:03:26 Terry Boards ([id number]) -------------------------------------------------------------------------------- Patient/Caregiver Education Details Patient Name: MILLION, MAHARAJ Date of Service: 12/07/2019 1:00 PM Medical Record Number: [id number] Patient Account Number: [id number] Date of Birth/Gender: March 30, 1933 (86 y.o. M) Treating RN: Huel Coventry Primary Care Physician: Rennie Plowman Other Clinician: Referring Physician: Nicholes Rough Treating Physician/Extender: Altamese [place] in Treatment: 0 Education Assessment Education Provided To: Patient Education Topics Provided [facility]: Handouts: [facility] Responses: State content correctly Wound/Skin Impairment: Handouts: Caring for Your Ulcer Methods: Demonstration, Explain/Verbal Responses: State content correctly Electronic Signature(s) Signed: 01/24/2020 12:53:52 PM By: Elliot Gurney, BSN, RN, CWS, Kim RN, BSN Entered By: Elliot Gurney, BSN, RN, CWS, Kim on 12/07/2019 13:41:48 Terry Boards ([id number]) -------------------------------------------------------------------------------- Wound Assessment Details Patient Name: AZEEM, POORMAN. Date of Service: 12/07/2019 1:00 PM Medical Record Number: [id number] Patient Account Number: [id number] Date of Birth/Sex: 1933-04-09 (86 y.o. M) Treating RN: Rodell Perna Primary Care Nefertari Rebman: Rennie Plowman Other Clinician: Referring Azarel Banner: Nicholes Rough Treating Fletcher Ostermiller/Extender: Altamese [place] in Treatment: 0 Wound Status Wound Number: 1 Primary Etiology: Pressure Ulcer Wound Location: Right Calcaneus Wound Status: Open Wounding Event: Gradually Appeared Date Acquired: 09/26/2019 Weeks Of Treatment: 0 Clustered Wound: No Photos Wound Measurements Length: (cm)  2.2 Width: (cm) 2.6 Depth: (cm) 1.8 Area: (cm) 4.492 Volume: (cm) 8.086 % Reduction in Area: % Reduction in Volume: Epithelialization: None Tunneling: No Undermining: No Wound Description Classification: Category/Stage II Exudate Amount: Medium Exudate Type: Serosanguineous Exudate Color: red, brown Foul Odor After Cleansing: No Slough/Fibrino Yes Wound Bed Granulation Amount: Medium (34-66%) Exposed Structure Granulation Quality: Red Fascia Exposed: No Necrotic Amount: Medium (34-66%) Fat Layer (Subcutaneous Tissue) Exposed: Yes Necrotic Quality: Adherent Slough Tendon Exposed: No Muscle Exposed: No Joint Exposed: No Bone Exposed: No Electronic Signature(s) Signed: 12/08/2019 11:19:24 AM By: Rodell Perna Entered By: Rodell Perna on 12/07/2019 13:11:50 Terry Boards ([id number]) -------------------------------------------------------------------------------- Vitals Details Patient Name: Terry Boards. Date of Service: 12/07/2019 1:00 PM Medical Record Number: [id number] Patient Account Number: [id number] Date of Birth/Sex: 22-Jul-1933 (86 y.o. M) Treating RN: Huel Coventry Primary Care Candi Profit: Rennie Plowman Other Clinician: Referring Sharmain Lastra: Nicholes Rough Treating Kavya Haag/Extender: Altamese [place] in Treatment: 0 Vital Signs Time Taken: 13:00 Temperature (F): 99.8 Height (in): 74 Pulse (bpm): 93 Source: Stated Respiratory Rate (breaths/min): 18  Weight (lbs): 254 Blood Pressure (mmHg): 157/79 Source: Measured Reference Range: 80 - 120 mg / dl Body Mass Index (BMI): 32.6 Electronic Signature(s) Signed: 12/07/2019 4:54:55 PM By: Dayton Martes RCP, RRT, CHT Entered By: Dayton Martes on 12/07/2019 13:04:13

## 2020-01-24 NOTE — Patient Instructions (Addendum)
Referral to home health Let us know if you dont hear back within a couple of days in regards to an appointment being scheduled.

## 2020-01-24 NOTE — Assessment & Plan Note (Signed)
Reviewed hospitalization, podiatry and vascular consult with patient and son today. Patient declines removing dressing of right foot due to skin graph today for assessment which is reasonable. He has no concerns for infection.  He has follow up with podiatry tomorrow which the wound will be examined. Son states no longer needing wound care or wound vac ( he plans to return wound vac). Today patient and son would like in home pt, nursing, ot since he feels over deconditioned from hospitalization, rehab.  Placed urgently today and have spoken with Erie Noe today so she can start working on this.

## 2020-01-24 NOTE — Progress Notes (Signed)
Subjective:    Patient ID: Terry Vaughn, male    DOB: 07-Aug-1932, 86 y.o.   MRN: [id number]  CC: Terry Vaughn is a 86 y.o. male who presents today for follow up.   HPI: Accompanied by son Feels well today Per son, advised he no longer needing wound vac. He will not make an appointment with wound care.  Has wound vac at the house and likely will return.  Had PT in nursing home and would like to resume PT at home.  No falls. Feels deconditioned. Using walker at home.    No discoloration of toes, pain in calves while walking, leg swelling, fever, N,V .   Wearing surgical shoe and using toe touch only. Has skin graph.   Has blood pressure machine at home, hasnt checked blood pressure in some time. No cp, sob.   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 Advised dressing changes MWF, apply wound vac.  Appt tomorrow with Wendall Stade, NP- vascular- osteomyelitis right foot Hospitalized for right foot ulcer, debridement and wound VAC placement. Bilateral ABIs 11/25/19. Adequate circulation, if regressed, may proceed with angiogram Home care visit- dr Einar Crow 12/15/19 at [facility].  Discharged from outpatient rehab  01/09/20   admitted 12/07/19, discharged 12/13/19 for osteomyelitis. MRI didn't show osteomyelitis. Debridement with Dr Excell Seltzer 12/07/19. augmentin PO x 7 days, wound culture shows proteus, enterococcus and bacteroides 12/07/19 WBC 12/13/19 9.8 GFR > 60 12/09/19 DG right foot- wound on plantar surface 12/09/19 MR right foot- negative osteomyelitis. Soft tissue ulceration, partial thickness insertional tear, chronic plantar faciitis  HISTORY:  Past Medical History:  Diagnosis Date  . Bone spur 1988   left heel  . Bursitis 1987   right shoulder  . Cellulitis 1986   right leg  . Pneumonia    noted 07/17/17 CXR   . Shingles 1959  . Urinary retention   . UTI (urinary tract infection)    sepsis E coli UTI +urine and blood cultures 07/17/17      Past Surgical History:  Procedure Laterality Date  . APPENDECTOMY  1942  . CHOLECYSTECTOMY  1966  . EYE SURGERY  2015   catarcts extracted  . IRRIGATION AND DEBRIDEMENT FOOT Right 12/09/2019   Procedure: IRRIGATION AND DEBRIDEMENT HEEL;  Surgeon: Rosetta Posner, DPM;  Location: [facility] ORS;  Service: Podiatry;  Laterality: Right;  . JOINT REPLACEMENT     right hip 20 + years from 2019, left hip replaced 05/01/17   . MELANOMA EXCISION  04.2010   mole removal right arm  . TONSILLECTOMY AND ADENOIDECTOMY  1944  . TOTAL HIP ARTHROPLASTY  1999   right hip   Family History  Problem Relation Age of Onset  . Clotting disorder Mother   . Heart disease Father 67  . Cancer Sister        breast  . Cerebral aneurysm Brother        hemmorage  . Cancer Daughter        breast    Allergies: Codeine Current Outpatient Medications on File Prior to Visit  Medication Sig Dispense Refill  . aspirin 81 MG chewable tablet Chew 81 mg by mouth daily.    . tamsulosin (FLOMAX) 0.4 MG CAPS capsule TAKE 1 CAPSULE(0.4 MG) BY MOUTH AT BEDTIME (Patient taking differently: Take 0.4 mg by mouth at bedtime. ) 90 capsule 2  . docusate sodium (COLACE) 100 MG capsule Take 100 mg by mouth daily as needed for mild constipation. (Patient not  taking: Reported on 01/24/2020)    . finasteride (PROSCAR) 5 MG tablet TAKE 1 TABLET(5 MG) BY MOUTH DAILY (Patient not taking: Reported on 01/24/2020) 90 tablet 2   No current facility-administered medications on file prior to visit.    Social History   Tobacco Use  . Smoking status: Never Smoker  . Smokeless tobacco: Never Used  Vaping Use  . Vaping Use: Never used  Substance Use Topics  . Alcohol use: No    Alcohol/week: 0.0 standard drinks  . Drug use: No    Review of Systems  Constitutional: Negative for chills and fever.  HENT: Negative for congestion, ear pain, rhinorrhea, sinus pressure and sore throat.   Respiratory: Negative for cough, shortness of breath and  wheezing.   Cardiovascular: Negative for chest pain, palpitations and leg swelling.  Gastrointestinal: Negative for diarrhea, nausea and vomiting.  Genitourinary: Negative for dysuria.  Musculoskeletal: Negative for myalgias.  Skin: Positive for wound (right foot). Negative for rash.  Neurological: Positive for weakness (lower extremity). Negative for headaches.  Hematological: Negative for adenopathy.      Objective:    BP 122/62 (BP Location: Left Arm, Patient Position: Sitting)   Pulse 77   Temp (!) 87.5 F (30.8 C)   Ht 6' 2.02" (1.88 m)   SpO2 99%   BMI 32.08 kg/m  BP Readings from Last 3 Encounters:  01/24/20 122/62  12/27/19 (!) 159/85  12/13/19 (!) 142/63   Wt Readings from Last 3 Encounters:  12/27/19 250 lb (113.4 kg)  12/09/19 270 lb 1 oz (122.5 kg)  06/22/19 270 lb (122.5 kg)    Physical Exam Vitals reviewed.  Constitutional:      Appearance: He is well-developed.  Cardiovascular:     Rate and Rhythm: Regular rhythm.     Heart sounds: Normal heart sounds.     Comments: No LE edema, palpable cords or masses. No erythema or increased warmth. No asymmetry in calf size when compared bilaterally LE hair growth symmetric and present. No discoloration of varicosities noted. LE warm.  Pulmonary:     Effort: Pulmonary effort is normal. No respiratory distress.     Breath sounds: Normal breath sounds. No wheezing, rhonchi or rales.  Musculoskeletal:     Right lower leg: No edema.     Left lower leg: No edema.  Feet:     Comments: Wearing walking boot right foot. Compression stocking underneath.  Skin:    General: Skin is warm and dry.  Neurological:     Mental Status: He is alert.  Psychiatric:        Speech: Speech normal.        Behavior: Behavior normal.        Assessment & Plan:   Problem List Items Addressed This Visit      Cardiovascular and Mediastinum   Hypertension    Well controlled, not on medication. Advised patient and son to spot  check at home to ensure stays less than 130/80        Other   Right foot ulcer (HCC) - Primary   Relevant Orders   Ambulatory referral to Home Health   Toe ulcer, right [facility])    Reviewed hospitalization, podiatry and vascular consult with patient and son today. Patient declines removing dressing of right foot due to skin graph today for assessment which is reasonable. He has no concerns for infection.  He has follow up with podiatry tomorrow which the wound will be examined. Son states no longer needing  wound care or wound vac ( he plans to return wound vac). Today patient and son would like in home pt, nursing, ot since he feels over deconditioned from hospitalization, rehab.  Placed urgently today and have spoken with Erie Noe today so she can start working on this.           I have discontinued Devonte Migues. Maurin's acidophilus. I am also having him maintain his aspirin, tamsulosin, finasteride, and docusate sodium.   No orders of the defined types were placed in this encounter.   Return precautions given.   Risks, benefits, and alternatives of the medications and treatment plan prescribed today were discussed, and patient expressed understanding.   Education regarding symptom management and diagnosis given to patient on AVS.  Continue to follow with Allegra Grana, FNP for routine health maintenance.   [name] and I agreed with plan.   Rennie Plowman, FNP

## 2020-01-26 NOTE — Addendum Note (Signed)
Addended by: Tilford Pillar on: 01/26/2020 04:35 PM   Modules accepted: Orders

## 2020-02-17 ENCOUNTER — Ambulatory Visit (INDEPENDENT_AMBULATORY_CARE_PROVIDER_SITE_OTHER): Payer: Medicare Other | Admitting: Vascular Surgery

## 2020-02-17 ENCOUNTER — Other Ambulatory Visit: Payer: Self-pay

## 2020-02-17 ENCOUNTER — Encounter (INDEPENDENT_AMBULATORY_CARE_PROVIDER_SITE_OTHER): Payer: Self-pay | Admitting: Vascular Surgery

## 2020-02-17 ENCOUNTER — Other Ambulatory Visit (INDEPENDENT_AMBULATORY_CARE_PROVIDER_SITE_OTHER): Payer: Self-pay | Admitting: Nurse Practitioner

## 2020-02-17 ENCOUNTER — Ambulatory Visit (INDEPENDENT_AMBULATORY_CARE_PROVIDER_SITE_OTHER): Payer: Medicare Other

## 2020-02-17 ENCOUNTER — Other Ambulatory Visit (INDEPENDENT_AMBULATORY_CARE_PROVIDER_SITE_OTHER): Payer: Self-pay | Admitting: Vascular Surgery

## 2020-02-17 VITALS — BP 137/76 | HR 73 | Resp 17 | Ht 74.0 in | Wt 250.0 lb

## 2020-02-17 DIAGNOSIS — L97519 Non-pressure chronic ulcer of other part of right foot with unspecified severity: Secondary | ICD-10-CM | POA: Diagnosis not present

## 2020-02-17 DIAGNOSIS — M86171 Other acute osteomyelitis, right ankle and foot: Secondary | ICD-10-CM

## 2020-02-17 DIAGNOSIS — I739 Peripheral vascular disease, unspecified: Secondary | ICD-10-CM

## 2020-02-17 DIAGNOSIS — I10 Essential (primary) hypertension: Secondary | ICD-10-CM | POA: Diagnosis not present

## 2020-02-17 NOTE — Assessment & Plan Note (Signed)
blood pressure control important in reducing the progression of atherosclerotic disease. On appropriate oral medications.  

## 2020-02-17 NOTE — Assessment & Plan Note (Signed)
His ABIs today remain normal at 1.2 bilaterally with brisk triphasic waveforms consistent with no arterial insufficiency.  Given the longstanding nature of this difficult wound, we will continue to follow him but no intervention would currently be of benefit.  We will plan to see him back in a few months.

## 2020-02-17 NOTE — Progress Notes (Signed)
MRN : [id number]  Terry Vaughn is a 86 y.o. (1933/03/22) male who presents with chief complaint of  Chief Complaint  Patient presents with  . Follow-up    ultrasound  .  History of Present Illness: Patient returns today in follow up of his right foot ulceration. The wound is slowly healing and Dr. Excell Seltzer is doing an excellent job managing this. His ABIs today remain normal at 1.2 bilaterally with brisk triphasic waveforms consistent with no arterial insufficiency.  He has no fevers or chills or signs of infection.  Current Outpatient Medications  Medication Sig Dispense Refill  . aspirin 81 MG chewable tablet Chew 81 mg by mouth daily.    Marland Kitchen docusate sodium (COLACE) 100 MG capsule Take 100 mg by mouth daily as needed for mild constipation.     . tamsulosin (FLOMAX) 0.4 MG CAPS capsule TAKE 1 CAPSULE(0.4 MG) BY MOUTH AT BEDTIME (Patient taking differently: Take 0.4 mg by mouth at bedtime. ) 90 capsule 2  . finasteride (PROSCAR) 5 MG tablet TAKE 1 TABLET(5 MG) BY MOUTH DAILY (Patient not taking: Reported on 01/24/2020) 90 tablet 2   No current facility-administered medications for this visit.    Past Medical History:  Diagnosis Date  . Bone spur 1988   left heel  . Bursitis 1987   right shoulder  . Cellulitis 1986   right leg  . Pneumonia    noted 07/17/17 CXR   . Shingles 1959  . Urinary retention   . UTI (urinary tract infection)    sepsis E coli UTI +urine and blood cultures 07/17/17     Past Surgical History:  Procedure Laterality Date  . APPENDECTOMY  1942  . CHOLECYSTECTOMY  1966  . EYE SURGERY  2015   catarcts extracted  . IRRIGATION AND DEBRIDEMENT FOOT Right 12/09/2019   Procedure: IRRIGATION AND DEBRIDEMENT HEEL;  Surgeon: Rosetta Posner, DPM;  Location: [facility] ORS;  Service: Podiatry;  Laterality: Right;  . JOINT REPLACEMENT     right hip 20 + years from 2019, left hip replaced 05/01/17   . MELANOMA EXCISION  04.2010   mole removal right arm  . TONSILLECTOMY AND  ADENOIDECTOMY  1944  . TOTAL HIP ARTHROPLASTY  1999   right hip     Social History   Tobacco Use  . Smoking status: Never Smoker  . Smokeless tobacco: Never Used  Vaping Use  . Vaping Use: Never used  Substance Use Topics  . Alcohol use: No    Alcohol/week: 0.0 standard drinks  . Drug use: No       Family History  Problem Relation Age of Onset  . Clotting disorder Mother   . Heart disease Father 27  . Cancer Sister        breast  . Cerebral aneurysm Brother        hemmorage  . Cancer Daughter        breast     Allergies  Allergen Reactions  . Codeine Other (See Comments)    Alters mental state     REVIEW OF SYSTEMS (Negative unless checked)  Constitutional: Weight loss  Fever  Chills Cardiac: Chest pain   Chest pressure   Palpitations   Shortness of breath when laying flat   Shortness of breath at rest   Shortness of breath with exertion. Vascular:  Pain in legs with walking   Pain in legs at rest   Pain in legs when laying flat   Claudication   Pain  in feet when walking  [] Pain in feet at rest  [] Pain in feet when laying flat   [] History of DVT   [] Phlebitis   [x] Swelling in legs   [x] Varicose veins   [x] Non-healing ulcers Pulmonary:   [] Uses home oxygen   [] Productive cough   [] Hemoptysis   [] Wheeze  [] COPD   [] Asthma Neurologic:  [] Dizziness  [] Blackouts   [] Seizures   [] History of stroke   [] History of TIA  [] Aphasia   [] Temporary blindness   [] Dysphagia   [] Weakness or numbness in arms   [] Weakness or numbness in legs Musculoskeletal:  [x] Arthritis   [] Joint swelling   [] Joint pain   [] Low back pain Hematologic:  [] Easy bruising  [] Easy bleeding   [] Hypercoagulable state   [] Anemic   Gastrointestinal:  [] Blood in stool   [] Vomiting blood  [] Gastroesophageal reflux/heartburn   [] Abdominal pain Genitourinary:  [] Chronic kidney disease   [] Difficult urination  [] Frequent urination  [] Burning with urination   [] Hematuria Skin:  [] Rashes    [x] Ulcers   [x] Wounds Psychological:  [] History of anxiety   []  History of major depression.  Physical Examination  BP (!) 137/76 (BP Location: Right Arm)   Pulse 73   Resp 17   Ht 6\' 2"  (1.88 m)   Wt (!) 250 lb (113.4 kg)   BMI 32.10 kg/m  Gen:  WD/WN, NAD.  Appears younger than stated age Head: [place]/AT, No temporalis wasting. Ear/Nose/Throat: Hearing grossly intact, nares w/o erythema or drainage Eyes: Conjunctiva clear. Sclera non-icteric Neck: Supple.  Trachea midline Pulmonary:  Good air movement, no use of accessory muscles.  Cardiac: RRR, no JVD Vascular:  Vessel Right Left  Radial Palpable Palpable                          PT Palpable Palpable  DP Palpable Palpable   Gastrointestinal: soft, non-tender/non-distended. No guarding/reflex.  Musculoskeletal: M/S 5/5 throughout.  No deformity or atrophy.  Right foot wound is currently dressed.  Trace bilateral lower extremity edema. Neurologic: Sensation grossly intact in extremities.  Symmetrical.  Speech is fluent.  Psychiatric: Judgment intact, Mood & affect appropriate for pt's clinical situation. Dermatologic: Right foot wound is currently dressed.       Labs Recent Results (from the past 2160 hour(s))  Comprehensive metabolic panel     Status: Abnormal   Collection Time: 12/07/19  2:13 PM  Result Value Ref Range   Sodium 138 135 - 145 mmol/L   Potassium 4.5 3.5 - 5.1 mmol/L   Chloride 101 98 - 111 mmol/L   CO2 28 22 - 32 mmol/L   Glucose, Bld 99 70 - 99 mg/dL    Comment: Glucose reference range applies only to samples taken after fasting for at least 8 hours.   BUN 24 (H) 8 - 23 mg/dL   Creatinine, Ser (H) 0.61 - 1.24 mg/dL   Calcium 8.9 8.9 - mg/dL   Total Protein 7.2 6.5 - 8.1 g/dL   Albumin 4.2 3.5 - 5.0 g/dL   AST 22 15 - 41 U/L   ALT 22 0 - 44 U/L   Alkaline Phosphatase 60 38 - 126 U/L   Total Bilirubin 1.8 (H) 0.3 - 1.2 mg/dL   GFR calc non Af Amer 43 (L) >60 mL/min   GFR calc Af  Amer 50 (L) >60 mL/min   Anion gap 9 5 - 15    Comment: Performed at [facility], [address] [place],  [place] [id number]  CBC with Differential     Status: Abnormal   Collection Time: 12/07/19  2:13 PM  Result Value Ref Range   WBC 13.5 (H) 4.0 - 10.5 K/uL   RBC 4.10 (L) 4.22 - 5.81 MIL/uL   Hemoglobin 13.5 13.0 - 17.0 g/dL   HCT 60.439.9 39 - 52 %   MCV 97.3 80.0 - 100.0 fL   MCH 32.9 26.0 - 34.0 pg   MCHC 33.8 30.0 - 36.0 g/dL   RDW 54.013.3 98.111.5 - 19.115.5 %   Platelets 341 150 - 400 K/uL   nRBC 0.0 0.0 - 0.2 %   Neutrophils Relative % 82 %   Neutro Abs 10.9 (H) 1.7 - 7.7 K/uL   Lymphocytes Relative 9 %   Lymphs Abs 1.2 0.7 - 4.0 K/uL   Monocytes Relative 9 %   Monocytes Absolute 1.2 (H) 0 - 1 K/uL   Eosinophils Relative 0 %   Eosinophils Absolute 0.0 0 - 0 K/uL   Basophils Relative 0 %   Basophils Absolute 0.0 0 - 0 K/uL   Immature Granulocytes 0 %   Abs Immature Granulocytes 0.06 0.00 - 0.07 K/uL    Comment: Performed at [facility], [address] [place], [place] [address]  Hemoglobin A1c     Status: None   Collection Time: 12/07/19  2:13 PM  Result Value Ref Range   Hgb A1c MFr Bld 5.4 4.8 - 5.6 %    Comment: (NOTE) Pre diabetes:          5.7%-6.4% Diabetes:              >6.4% Glycemic control for   <7.0% adults with diabetes    Mean Plasma Glucose 108.28 mg/dL    Comment: Performed at [facility], [address] [place], [place] [address]  TSH     Status: None   Collection Time: 12/07/19  2:13 PM  Result Value Ref Range   TSH 2.771 0.350 - 4.500 uIU/mL    Comment: Performed by a 3rd Generation assay with a functional sensitivity of <=0.01 uIU/mL. Performed at [facility], [address] [place], [place] [address]   Creatinine, serum     Status: Abnormal   Collection Time: 12/07/19  2:13 PM  Result Value Ref Range   Creatinine, Ser 1.45 (H) 0.61 - 1.24 mg/dL   GFR calc non Af Amer 44 (L) >60 mL/min   GFR calc Af Amer 51  (L) >60 mL/min    Comment: Performed at [facility], [address] [place], [place] [address]  Blood Culture (routine x 2)     Status: None   Collection Time: 12/07/19  2:48 PM   Specimen: BLOOD  Result Value Ref Range   Specimen Description      BLOOD Blood Culture results may not be optimal due to an inadequate volume of blood received in culture bottles   Special Requests      BOTTLES DRAWN AEROBIC AND ANAEROBIC RIGHT ANTECUBITAL   Culture      NO GROWTH 5 DAYS Performed at [facility], [address] [place], [place] [address]    Report Status 12/12/2019 FINAL   Blood Culture (routine x 2)     Status: None   Collection Time: 12/07/19  3:06 PM   Specimen: BLOOD  Result Value Ref Range   Specimen Description BLOOD LEFT ANTECUBITAL    Special Requests      BOTTLES DRAWN AEROBIC AND  ANAEROBIC Blood Culture results may not be optimal due to an excessive volume of blood received in culture bottles   Culture      NO GROWTH 5 DAYS Performed at [facility], [address] [place], [place] [address]    Report Status 12/12/2019 FINAL   SARS Coronavirus 2 by RT PCR (hospital order, performed in [facility] hospital lab) Nasopharyngeal Nasopharyngeal Swab     Status: None   Collection Time: 12/07/19  3:49 PM   Specimen: Nasopharyngeal Swab  Result Value Ref Range   SARS Coronavirus 2 NEGATIVE NEGATIVE    Comment: (NOTE) SARS-CoV-2 target nucleic acids are NOT DETECTED. The SARS-CoV-2 RNA is generally detectable in upper and lower respiratory specimens during the acute phase of infection. The lowest concentration of SARS-CoV-2 viral copies this assay can detect is 250 copies / mL. A negative result does not preclude SARS-CoV-2 infection and should not be used as the sole basis for treatment or other patient management decisions.  A negative result may occur with improper specimen collection / handling, submission of specimen other than  nasopharyngeal swab, presence of viral mutation(s) within the areas targeted by this assay, and inadequate number of viral copies (<250 copies / mL). A negative result must be combined with clinical observations, patient history, and epidemiological information. Fact Sheet for Patients:   BoilerBrush.com.cy Fact Sheet for Healthcare Providers: https://pope.com/ This test is not yet approved or cleared  by the [place] FDA and has been authorized for detection and/or diagnosis of SARS-CoV-2 by FDA under an Emergency Use Authorization (EUA).  This EUA will remain in effect (meaning this test can be used) for the duration of the COVID-19 declaration under Section 564(b)(1) of the Act, 21 U.S.C. section 360bbb-3(b)(1), unless the authorization is terminated or revoked sooner. Performed at [facility], [address] [place], [place] [address]   Urinalysis, Complete w Microscopic     Status: Abnormal   Collection Time: 12/07/19  6:18 PM  Result Value Ref Range   Color, Urine YELLOW (A) YELLOW   APPearance CLEAR (A) CLEAR   Specific Gravity, Urine 1.018 1.005 - 1.030   pH 6.0 5.0 - 8.0   Glucose, UA NEGATIVE NEGATIVE mg/dL   Hgb urine dipstick SMALL (A) NEGATIVE   Bilirubin Urine NEGATIVE NEGATIVE   Ketones, ur 5 (A) NEGATIVE mg/dL   Protein, ur NEGATIVE NEGATIVE mg/dL   Nitrite NEGATIVE NEGATIVE   Leukocytes,Ua NEGATIVE NEGATIVE   RBC / HPF 0-5 0 - 5 RBC/hpf   WBC, UA 0-5 0 - 5 WBC/hpf   Bacteria, UA NONE SEEN NONE SEEN   Squamous Epithelial / LPF NONE SEEN 0 - 5    Comment: Performed at [facility], [address] [place], [place] [address]  Aerobic/Anaerobic Culture (surgical/deep wound)     Status: None   Collection Time: 12/07/19  6:26 PM   Specimen: Wound  Result Value Ref Range   Specimen Description      WOUND Performed at [facility], [address], [place], [place] [address]     Special Requests      NONE Performed at [facility], [address] [place], [place] [address]    Gram Stain      FEW WBC PRESENT, PREDOMINANTLY PMN ABUNDANT GRAM NEGATIVE RODS ABUNDANT GRAM POSITIVE COCCI IN PAIRS IN CLUSTERS    Culture      FEW PROTEUS MIRABILIS FEW ENTEROCOCCUS FAECALIS ABUNDANT BACTEROIDES FRAGILIS BETA LACTAMASE POSITIVE Performed at [facility], [address] [place], [place] [address]    Report Status 12/13/2019 FINAL    Organism ID, Bacteria PROTEUS MIRABILIS    Organism ID, Bacteria ENTEROCOCCUS FAECALIS       Susceptibility   Enterococcus faecalis - MIC*    AMPICILLIN <=2 SENSITIVE Sensitive     VANCOMYCIN 1 SENSITIVE Sensitive     GENTAMICIN SYNERGY SENSITIVE Sensitive     * FEW ENTEROCOCCUS FAECALIS   Proteus mirabilis - MIC*    AMPICILLIN <=2 SENSITIVE Sensitive     CEFAZOLIN <=4 SENSITIVE Sensitive     CEFEPIME <=1 SENSITIVE Sensitive     CEFTAZIDIME <=1 SENSITIVE Sensitive     CEFTRIAXONE <=1 SENSITIVE Sensitive     CIPROFLOXACIN <=0.25 SENSITIVE Sensitive     GENTAMICIN <=1 SENSITIVE Sensitive     IMIPENEM 2 SENSITIVE Sensitive     TRIMETH/SULFA <=20 SENSITIVE Sensitive     AMPICILLIN/SULBACTAM <=2 SENSITIVE Sensitive     PIP/TAZO <=4 SENSITIVE Sensitive     * FEW PROTEUS MIRABILIS  Surgical PCR screen     Status: None   Collection Time: 12/08/19 10:19 PM   Specimen: Nasal Mucosa; Nasal Swab  Result Value Ref Range   MRSA, PCR NEGATIVE NEGATIVE   Staphylococcus aureus NEGATIVE NEGATIVE    Comment: (NOTE) The Xpert SA Assay (FDA approved for NASAL specimens in patients 62 years of age and older), is one component of a comprehensive surveillance program. It is not intended to diagnose infection nor to guide or monitor treatment. Performed at [facility], [address] [place], [place] [address]   CBC     Status: Abnormal   Collection Time: 12/09/19  4:15 AM  Result Value Ref Range   WBC 9.3 4.0 -  10.5 K/uL   RBC 4.12 (L) 4.22 - 5.81 MIL/uL   Hemoglobin 13.3 13.0 - 17.0 g/dL   HCT 09.8 39 - 52 %   MCV 96.4 80.0 - 100.0 fL   MCH 32.3 26.0 - 34.0 pg   MCHC 33.5 30.0 - 36.0 g/dL   RDW 11.9 14.7 - 82.9 %   Platelets 314 150 - 400 K/uL   nRBC 0.0 0.0 - 0.2 %    Comment: Performed at [facility], [address], [place], [place] [address]  Basic metabolic panel     Status: Abnormal   Collection Time: 12/09/19  4:15 AM  Result Value Ref Range   Sodium 140 135 - 145 mmol/L   Potassium 3.6 3.5 - 5.1 mmol/L   Chloride 104 98 - 111 mmol/L   CO2 27 22 - 32 mmol/L   Glucose, Bld 104 (H) 70 - 99 mg/dL    Comment: Glucose reference range applies only to samples taken after fasting for at least 8 hours.   BUN 18 8 - 23 mg/dL   Creatinine, Ser 0.86 0.61 - 1.24 mg/dL   Calcium 8.4 (L) 8.9 - 10.3 mg/dL   GFR calc non Af Amer >60 >60 mL/min   GFR calc Af Amer >60 >60 mL/min   Anion gap 9 5 - 15    Comment: Performed at [facility], [address] [place], [place] [address]  CBC     Status: None   Collection Time: 12/13/19  5:24 AM  Result Value Ref Range   WBC 9.8 4.0 - 10.5 K/uL   RBC 4.33 4.22 - 5.81 MIL/uL   Hemoglobin 14.1 13.0 - 17.0 g/dL   HCT 96.2 39 - 52 %  MCV 95.4 80.0 - 100.0 fL   MCH 32.6 26.0 - 34.0 pg   MCHC 34.1 30.0 - 36.0 g/dL   RDW 43.3 29.5 - 18.8 %   Platelets 361 150 - 400 K/uL   nRBC 0.0 0.0 - 0.2 %    Comment: Performed at [facility], [address] [place], [place] [address]  SARS Coronavirus 2 by RT PCR (hospital order, performed in [facility] hospital lab) Nasopharyngeal Nasopharyngeal Swab     Status: None   Collection Time: 12/13/19 11:45 AM   Specimen: Nasopharyngeal Swab  Result Value Ref Range   SARS Coronavirus 2 NEGATIVE NEGATIVE    Comment: (NOTE) SARS-CoV-2 target nucleic acids are NOT DETECTED. The SARS-CoV-2 RNA is generally detectable in upper and lower respiratory specimens during the acute phase of  infection. The lowest concentration of SARS-CoV-2 viral copies this assay can detect is 250 copies / mL. A negative result does not preclude SARS-CoV-2 infection and should not be used as the sole basis for treatment or other patient management decisions.  A negative result may occur with improper specimen collection / handling, submission of specimen other than nasopharyngeal swab, presence of viral mutation(s) within the areas targeted by this assay, and inadequate number of viral copies (<250 copies / mL). A negative result must be combined with clinical observations, patient history, and epidemiological information. Fact Sheet for Patients:   BoilerBrush.com.cy Fact Sheet for Healthcare Providers: https://pope.com/ This test is not yet approved or cleared  by the [place] FDA and has been authorized for detection and/or diagnosis of SARS-CoV-2 by FDA under an Emergency Use Authorization (EUA).  This EUA will remain in effect (meaning this test can be used) for the duration of the COVID-19 declaration under Section 564(b)(1) of the Act, 21 U.S.C. section 360bbb-3(b)(1), unless the authorization is terminated or revoked sooner. Performed at [facility], [address], [place], [place] [address]     Radiology No results found.  Assessment/Plan  Hypertension blood pressure control important in reducing the progression of atherosclerotic disease. On appropriate oral medications.   Right foot ulcer (HCC) His ABIs today remain normal at 1.2 bilaterally with brisk triphasic waveforms consistent with no arterial insufficiency.  Given the longstanding nature of this difficult wound, we will continue to follow him but no intervention would currently be of benefit.  We will plan to see him back in a few months.    Festus Barren, MD  02/17/2020 10:03 AM    This note was created with Dragon medical transcription system.   Any errors from dictation are purely unintentional

## 2020-03-26 ENCOUNTER — Other Ambulatory Visit: Payer: Self-pay

## 2020-03-26 ENCOUNTER — Ambulatory Visit (INDEPENDENT_AMBULATORY_CARE_PROVIDER_SITE_OTHER): Payer: Medicare Other | Admitting: Family

## 2020-03-26 ENCOUNTER — Encounter: Payer: Self-pay | Admitting: Family

## 2020-03-26 VITALS — BP 140/64 | HR 70 | Temp 97.9°F | Ht 74.0 in | Wt 250.0 lb

## 2020-03-26 DIAGNOSIS — L97511 Non-pressure chronic ulcer of other part of right foot limited to breakdown of skin: Secondary | ICD-10-CM | POA: Diagnosis not present

## 2020-03-26 DIAGNOSIS — Z0000 Encounter for general adult medical examination without abnormal findings: Secondary | ICD-10-CM | POA: Diagnosis not present

## 2020-03-26 DIAGNOSIS — I10 Essential (primary) hypertension: Secondary | ICD-10-CM | POA: Diagnosis not present

## 2020-03-26 NOTE — Assessment & Plan Note (Addendum)
Chronic. Following with dermatology. Declines removing boot today for evaluation, he has follow up with dr Engineer, production , will follow.

## 2020-03-26 NOTE — Assessment & Plan Note (Addendum)
Overall controlled based on prior recent readings. Limited by low end diastolic blood pressure. Advised patient to monitor at home and call with readings.

## 2020-03-26 NOTE — Assessment & Plan Note (Addendum)
Well nourished. Vaccinations UTD ; he cannot have the shingrex due to insurance.  Deferred prostate exam as following with dr Apolinar Junes. No longer screening for colon cancer; no changes to bowel habits. Will recommend PT once patient can be weight bearing.

## 2020-03-26 NOTE — Progress Notes (Signed)
Subjective:    Patient ID: Terry Vaughn, male    DOB: 28-Oct-1932, 86 y.o.   MRN: [id number]  CC: Terry Vaughn is a 86 y.o. male who presents today for annual physical.   HPI: Accompanied by son In wheelchair today  Remains frustrated by right toe ulcer as has been on going for several months.  following with Dr Excell Seltzer. No concerns for infection.  Has had skin grafts and wearing walking boot. Not at active as not weight bearing at this time. Looks forward to being able to walk more.   Once he is able to walk , he would like to do PT.   No falls. Lives with wife.   Appetite is good.  No depression. Sleeping well.    BPH- follows with urology, Dr Apolinar Junes  Never had shingrex - insurance doesn't cover.   No changes in bowel habits. No constipation. No bowel movement changes.  Dr Wyn Quaker 01/2020- ABI normal bilaterally. No intervention  Follows with dermatology annually, Dr Roseanne Kaufman   HISTORY:  Past Medical History:  Diagnosis Date  . Bone spur 1988   left heel  . Bursitis 1987   right shoulder  . Cellulitis 1986   right leg  . Pneumonia    noted 07/17/17 CXR   . Shingles 1959  . Urinary retention   . UTI (urinary tract infection)    sepsis E coli UTI +urine and blood cultures 07/17/17    Past Surgical History:  Procedure Laterality Date  . APPENDECTOMY  1942  . CHOLECYSTECTOMY  1966  . EYE SURGERY  2015   catarcts extracted  . IRRIGATION AND DEBRIDEMENT FOOT Right 12/09/2019   Procedure: IRRIGATION AND DEBRIDEMENT HEEL;  Surgeon: Rosetta Posner, DPM;  Location: [facility] ORS;  Service: Podiatry;  Laterality: Right;  . JOINT REPLACEMENT     right hip 20 + years from 2019, left hip replaced 05/01/17   . MELANOMA EXCISION  04.2010   mole removal right arm  . TONSILLECTOMY AND ADENOIDECTOMY  1944  . TOTAL HIP ARTHROPLASTY  1999   right hip   Family History  Problem Relation Age of Onset  . Clotting disorder Mother   . Heart disease Father 48  . Cancer Sister         breast  . Cerebral aneurysm Brother        hemm[place]  . Cancer Daughter        breast    Allergies: Codeine Current Outpatient Medications on File Prior to Visit  Medication Sig Dispense Refill  . aspirin 81 MG chewable tablet Chew 81 mg by mouth daily.    Marland Kitchen docusate sodium (COLACE) 100 MG capsule Take 100 mg by mouth daily as needed for mild constipation.     . finasteride (PROSCAR) 5 MG tablet TAKE 1 TABLET(5 MG) BY MOUTH DAILY 90 tablet 2  . tamsulosin (FLOMAX) 0.4 MG CAPS capsule TAKE 1 CAPSULE(0.4 MG) BY MOUTH AT BEDTIME (Patient taking differently: Take 0.4 mg by mouth at bedtime. ) 90 capsule 2   No current facility-administered medications on file prior to visit.    Social History   Tobacco Use  . Smoking status: Never Smoker  . Smokeless tobacco: Never Used  Vaping Use  . Vaping Use: Never used  Substance Use Topics  . Alcohol use: No    Alcohol/week: 0.0 standard drinks  . Drug use: No    Review of Systems  Constitutional: Negative for chills and fever.  HENT: Negative for congestion.  Respiratory: Negative for cough.   Cardiovascular: Negative for chest pain, palpitations and leg swelling.  Gastrointestinal: Negative for diarrhea, nausea and vomiting.  Musculoskeletal: Negative for myalgias.  Skin: Negative for rash.  Neurological: Negative for headaches.  Hematological: Negative for adenopathy.  Psychiatric/Behavioral: Negative for confusion.      Objective:    BP 140/64   Pulse 70   Temp 97.9 F (36.6 C)   Ht 6\' 2"  (1.88 m)   Wt 250 lb (113.4 kg)   SpO2 96%   BMI 32.10 kg/m  BP Readings from Last 3 Encounters:  03/26/20 140/64  02/17/20 (!) 137/76  01/24/20 122/62   Wt Readings from Last 3 Encounters:  03/26/20 250 lb (113.4 kg)  02/17/20 (!) 250 lb (113.4 kg)  12/27/19 250 lb (113.4 kg)    Physical Exam Vitals reviewed.  Constitutional:      Appearance: He is well-developed.  Cardiovascular:     Rate and Rhythm: Regular rhythm.      Heart sounds: Normal heart sounds.  Pulmonary:     Effort: Pulmonary effort is normal. No respiratory distress.     Breath sounds: Normal breath sounds. No wheezing, rhonchi or rales.  Musculoskeletal:     Right lower leg: No edema.     Left lower leg: No edema.  Feet:     Comments: Walking boot right foot Skin:    General: Skin is warm and dry.  Neurological:     Mental Status: He is alert.  Psychiatric:        Speech: Speech normal.        Behavior: Behavior normal.        Assessment & Plan:   Problem List Items Addressed This Visit      Cardiovascular and Mediastinum   Hypertension    Overall controlled based on prior recent readings. Limited by low end diastolic blood pressure. Advised patient to monitor at home and call with readings.        Other   Routine physical examination - Primary    Well nourished. Vaccinations UTD ; he cannot have the shingrex due to insurance.  Deferred prostate exam as following with dr 02/26/20. No longer screening for colon cancer; no changes to bowel habits. Will recommend PT once patient can be weight bearing.       Relevant Orders   Lipid panel   VITAMIN D 25 Hydroxy (Vit-D Deficiency, Fractures)   Toe ulcer, right (HCC)    Chronic. Following with dermatology. Declines removing boot today for evaluation, he has follow up with dr Apolinar Junes , will follow.          I am having Terry Vaughn. Jeff maintain his aspirin, tamsulosin, finasteride, and docusate sodium.   No orders of the defined types were placed in this encounter.   Return precautions given.   Risks, benefits, and alternatives of the medications and treatment plan prescribed today were discussed, and patient expressed understanding.   Education regarding symptom management and diagnosis given to patient on AVS.  Continue to follow with Littie Deeds, FNP for routine health maintenance.   Allegra Grana and I agreed with plan.   [name], FNP

## 2020-03-26 NOTE — Patient Instructions (Signed)
Nice to see you!   Health Maintenance, Male Adopting a healthy lifestyle and getting preventive care are important in promoting health and wellness. Ask your health care provider about:  The right schedule for you to have regular tests and exams.  Things you can do on your own to prevent diseases and keep yourself healthy. What should I know about diet, weight, and exercise? Eat a healthy diet   Eat a diet that includes plenty of vegetables, fruits, low-fat dairy products, and lean protein.  Do not eat a lot of foods that are high in solid fats, added sugars, or sodium. Maintain a healthy weight Body mass index (BMI) is a measurement that can be used to identify possible weight problems. It estimates body fat based on height and weight. Your health care provider can help determine your BMI and help you achieve or maintain a healthy weight. Get regular exercise Get regular exercise. This is one of the most important things you can do for your health. Most adults should:  Exercise for at least 150 minutes each week. The exercise should increase your heart rate and make you sweat (moderate-intensity exercise).  Do strengthening exercises at least twice a week. This is in addition to the moderate-intensity exercise.  Spend less time sitting. Even light physical activity can be beneficial. Watch cholesterol and blood lipids Have your blood tested for lipids and cholesterol at 84 years of age, then have this test every 5 years. You may need to have your cholesterol levels checked more often if:  Your lipid or cholesterol levels are high.  You are older than 84 years of age.  You are at high risk for heart disease. What should I know about cancer screening? Many types of cancers can be detected early and may often be prevented. Depending on your health history and family history, you may need to have cancer screening at various ages. This may include screening for:  Colorectal  cancer.  Prostate cancer.  Skin cancer.  Lung cancer. What should I know about heart disease, diabetes, and high blood pressure? Blood pressure and heart disease  High blood pressure causes heart disease and increases the risk of stroke. This is more likely to develop in people who have high blood pressure readings, are of African descent, or are overweight.  Talk with your health care provider about your target blood pressure readings.  Have your blood pressure checked: ? Every 3-5 years if you are 18-39 years of age. ? Every year if you are 40 years old or older.  If you are between the ages of 65 and 75 and are a current or former smoker, ask your health care provider if you should have a one-time screening for abdominal aortic aneurysm (AAA). Diabetes Have regular diabetes screenings. This checks your fasting blood sugar level. Have the screening done:  Once every three years after age 45 if you are at a normal weight and have a low risk for diabetes.  More often and at a younger age if you are overweight or have a high risk for diabetes. What should I know about preventing infection? Hepatitis B If you have a higher risk for hepatitis B, you should be screened for this virus. Talk with your health care provider to find out if you are at risk for hepatitis B infection. Hepatitis C Blood testing is recommended for:  Everyone born from 1945 through 1965.  Anyone with known risk factors for hepatitis C. Sexually transmitted infections (STIs)    You should be screened each year for STIs, including gonorrhea and chlamydia, if: ? You are sexually active and are younger than 84 years of age. ? You are older than 84 years of age and your health care provider tells you that you are at risk for this type of infection. ? Your sexual activity has changed since you were last screened, and you are at increased risk for chlamydia or gonorrhea. Ask your health care provider if you are at  risk.  Ask your health care provider about whether you are at high risk for HIV. Your health care provider may recommend a prescription medicine to help prevent HIV infection. If you choose to take medicine to prevent HIV, you should first get tested for HIV. You should then be tested every 3 months for as long as you are taking the medicine. Follow these instructions at home: Lifestyle  Do not use any products that contain nicotine or tobacco, such as cigarettes, e-cigarettes, and chewing tobacco. If you need help quitting, ask your health care provider.  Do not use street drugs.  Do not share needles.  Ask your health care provider for help if you need support or information about quitting drugs. Alcohol use  Do not drink alcohol if your health care provider tells you not to drink.  If you drink alcohol: ? Limit how much you have to 0-2 drinks a day. ? Be aware of how much alcohol is in your drink. In the [place] one drink equals one 12 oz bottle of beer (355 mL), one 5 oz glass of wine (148 mL), or one 1 oz glass of hard liquor (44 mL). General instructions  Schedule regular health, dental, and eye exams.  Stay current with your vaccines.  Tell your health care provider if: ? You often feel depressed. ? You have ever been abused or do not feel safe at home. Summary  Adopting a healthy lifestyle and getting preventive care are important in promoting health and wellness.  Follow your health care provider's instructions about healthy diet, exercising, and getting tested or screened for diseases.  Follow your health care provider's instructions on monitoring your cholesterol and blood pressure. This information is not intended to replace advice given to you by your health care provider. Make sure you discuss any questions you have with your health care provider. Document Revised: 07/07/2018 Document Reviewed: 07/07/2018 Elsevier Patient Education  2020 Elsevier Inc.  

## 2020-03-28 NOTE — Progress Notes (Signed)
I called patient & he stated that he would check BP for a few days to let us know numbers. He felt that maybe systolic was elevated due to emotions of being in the office.

## 2020-04-04 ENCOUNTER — Other Ambulatory Visit: Payer: Medicare Other

## 2020-04-23 ENCOUNTER — Ambulatory Visit (INDEPENDENT_AMBULATORY_CARE_PROVIDER_SITE_OTHER): Payer: Medicare Other

## 2020-04-23 VITALS — Ht 74.0 in | Wt 250.0 lb

## 2020-04-23 DIAGNOSIS — Z0000 Encounter for general adult medical examination without abnormal findings: Secondary | ICD-10-CM | POA: Diagnosis not present

## 2020-04-23 NOTE — Progress Notes (Addendum)
Subjective:   Terry Vaughn is a 86 y.o. male who presents for Medicare Annual/Subsequent preventive examination.  Review of Systems    No ROS.  Medicare Wellness Virtual Visit.  Cardiac Risk Factors include: advanced age (>53men, >15 women);male gender;hypertension     Objective:    Today's Vitals   04/23/20 1104  Weight: 250 lb (113.4 kg)  Height: 6\' 2"  (1.88 m)   Body mass index is 32.1 kg/m.  Advanced Directives 04/23/2020 12/09/2019 12/08/2019 12/07/2019 04/21/2019 11/30/2017 07/17/2017  Does Patient Have a Medical Advance Directive? Yes Yes Yes No Yes Yes Yes  Type of 07/19/2017 of [place];Living will Healthcare Power of [place];Living will Healthcare Power of [place];Living will - Healthcare Power of [place];Living will Healthcare Power of [place];Living will Healthcare Power of Attorney  Does patient want to make changes to medical advance directive? No - Patient declined No - Patient declined No - Patient declined - No - Patient declined No - Patient declined No - Patient declined  Copy of Healthcare Power of Attorney in Chart? Yes - validated most recent copy scanned in chart (See row information) No - copy requested Yes - validated most recent copy scanned in chart (See row information) - Yes - validated most recent copy scanned in chart (See row information) Yes Yes  Would patient like information on creating a medical advance directive? - - No - Patient declined No - Patient declined - - -    Current Medications (verified) Outpatient Encounter Medications as of 04/23/2020  Medication Sig  . aspirin 81 MG chewable tablet Chew 81 mg by mouth daily.  04/25/2020 docusate sodium (COLACE) 100 MG capsule Take 100 mg by mouth daily as needed for mild constipation.   . finasteride (PROSCAR) 5 MG tablet TAKE 1 TABLET(5 MG) BY MOUTH DAILY  . tamsulosin (FLOMAX) 0.4 MG CAPS capsule TAKE 1 CAPSULE(0.4 MG) BY MOUTH AT BEDTIME (Patient taking differently: Take 0.4 mg by  mouth at bedtime. )   No facility-administered encounter medications on file as of 04/23/2020.    Allergies (verified) Codeine   History: Past Medical History:  Diagnosis Date  . Bone spur 1988   left heel  . Bursitis 1987   right shoulder  . Cellulitis 1986   right leg  . Pneumonia    noted 07/17/17 CXR   . Shingles 1959  . Urinary retention   . UTI (urinary tract infection)    sepsis E coli UTI +urine and blood cultures 07/17/17    Past Surgical History:  Procedure Laterality Date  . APPENDECTOMY  1942  . CHOLECYSTECTOMY  1966  . EYE SURGERY  2015   catarcts extracted  . IRRIGATION AND DEBRIDEMENT FOOT Right 12/09/2019   Procedure: IRRIGATION AND DEBRIDEMENT HEEL;  Surgeon: 12/11/2019, DPM;  Location: [facility] ORS;  Service: Podiatry;  Laterality: Right;  . JOINT REPLACEMENT     right hip 20 + years from 2019, left hip replaced 05/01/17   . MELANOMA EXCISION  04.2010   mole removal right arm  . TONSILLECTOMY AND ADENOIDECTOMY  1944  . TOTAL HIP ARTHROPLASTY  1999   right hip   Family History  Problem Relation Age of Onset  . Clotting disorder Mother   . Heart disease Father 2  . Cancer Sister        breast  . Cerebral aneurysm Brother        [place]  . Cancer Daughter        breast   Social  History   Socioeconomic History  . Marital status: Married    Spouse name: Garlan Fair  . Number of children: 3  . Years of education: 66  . Highest education level: Some college, no degree  Occupational History  . Not on file  Tobacco Use  . Smoking status: Never Smoker  . Smokeless tobacco: Never Used  Vaping Use  . Vaping Use: Never used  Substance and Sexual Activity  . Alcohol use: No    Alcohol/week: 0.0 standard drinks  . Drug use: No  . Sexual activity: Not Currently  Other Topics Concern  . Not on file  Social History Narrative   Lives in [place] with wife.   Diet - regular   Exercise - none   Social Determinants of Health   Financial Resource  Strain: Low Risk   . Difficulty of Paying Living Expenses: Not hard at all  Food Insecurity: No Food Insecurity  . Worried About Programme researcher, broadcasting/film/video in the Last Year: Never true  . Ran Out of Food in the Last Year: Never true  Transportation Needs: No Transportation Needs  . Lack of Transportation (Medical): No  . Lack of Transportation (Non-Medical): No  Physical Activity: Unknown  . Days of Exercise per Week: 0 days  . Minutes of Exercise per Session: Not on file  Stress: No Stress Concern Present  . Feeling of Stress : Not at all  Social Connections: Unknown  . Frequency of Communication with Friends and Family: More than three times a week  . Frequency of Social Gatherings with Friends and Family: Not on file  . Attends Religious Services: Not on file  . Active Member of Clubs or Organizations: Not on file  . Attends Banker Meetings: Not on file  . Marital Status: Married    Tobacco Counseling Counseling given: Not Answered   Clinical Intake:  Pre-visit preparation completed: Yes        Diabetes: No  How often do you need to have someone help you when you read instructions, pamphlets, or other written materials from your doctor or pharmacy?: 1 - Never   Interpreter Needed?: No      Activities of Daily Living In your present state of health, do you have any difficulty performing the following activities: 04/23/2020 12/08/2019  Hearing? N -  Vision? N -  Difficulty concentrating or making decisions? N -  Walking or climbing stairs? Y -  Dressing or bathing? N -  Doing errands, shopping? Y N  Comment He does not drive or run errands alone. -  Preparing Food and eating ? Y -  Comment Wife assist with meal prep. Self feeds. -  Using the Toilet? N -  In the past six months, have you accidently leaked urine? N -  Do you have problems with loss of bowel control? N -  Managing your Medications? Y -  Comment Wife assist -  Managing your Finances? N -    Housekeeping or managing your Housekeeping? Y -  Some recent data might be hidden    Patient Care Team: Allegra Grana, FNP as PCP - General (Family Medicine)  Indicate any recent Medical Services you may have received from other than [facility] providers in the past year (date may be approximate).     Assessment:   This is a routine wellness examination for Darcy.  I connected with Troyce today by telephone and verified that I am speaking with the correct person using two identifiers. Location  patient: home Location provider: work Persons participating in the virtual visit: patient, Engineer, civil (consulting)nurse.    I discussed the limitations, risks, security and privacy concerns of performing an evaluation and management service by telephone and the availability of in person appointments. The patient expressed understanding and verbally consented to this telephonic visit.    Interactive audio and video telecommunications were attempted between this provider and patient, however failed, due to patient having technical difficulties OR patient did not have access to video capability.  We continued and completed visit with audio only.  Some vital signs may be absent or patient reported.   Hearing/Vision screen  Hearing Screening   125Hz  250Hz  500Hz  1000Hz  2000Hz  3000Hz  4000Hz  6000Hz  8000Hz   Right ear:           Left ear:           Comments: Patient is able to hear conversational tones without difficulty. No issues reported.  Vision Screening Comments: Wears corrective lenses Cataract extraction, bilateral  Visual acuity not assessed, virtual visit.  They have seen their ophthalmologist in the last 12 months.   Dietary issues and exercise activities discussed: Current Exercise Habits: The patient does not participate in regular exercise at present Healthy diet Good water intake Goals    . Increase physical activity     I should do more chair exercises using bands      Depression Screen PHQ 2/9  Scores 04/23/2020 01/24/2020 04/21/2019 11/30/2017 11/27/2016 11/28/2015 03/24/2014  PHQ - 2 Score 0 0 0 0 1 0 0  PHQ- 9 Score - - - - 2 - -    Fall Risk Fall Risk  04/23/2020 01/24/2020 04/21/2019 11/30/2017 11/27/2016  Falls in the past year? 0 0 0 No No  Number falls in past yr: 0 0 - - -  Injury with Fall? - 0 - - -  Follow up Falls evaluation completed Falls evaluation completed - - -   Handrails in use when climbing stairs? Yes Home free of loose throw rugs in walkways, pet beds, electrical cords, etc? Yes  Adequate lighting in your home to reduce risk of falls? Yes   ASSISTIVE DEVICES UTILIZED TO PREVENT FALLS: Life alert? No  Use of a cane, walker or w/c? Yes , walker in use.  Grab bars in the bathroom? Yes  Shower chair or bench in shower? Yes   TIMED UP AND GO: Was the test performed? No . Virtual visit.   Cognitive Function: Patient is alert.  Enjoys reading the newspaper for brain health. Notices some changes in short term memory. Plans to increase brain health activities.   MMSE - Mini Mental State Exam 11/28/2015  Orientation to time 5  Orientation to Place 5  Registration 3  Attention/ Calculation 5  Recall 3  Language- name 2 objects 2  Language- repeat 1  Language- follow 3 step command 3  Language- read & follow direction 1  Write a sentence 1  Copy design 1  Total score 30     6CIT Screen 04/23/2020 04/21/2019 11/30/2017 11/27/2016  What Year? 0 points 0 points 0 points 0 points  What month? 0 points 0 points 0 points 0 points  What time? 0 points 0 points 0 points 0 points  Count back from 20 - 0 points 0 points 0 points  Months in reverse - 0 points 0 points 0 points  Repeat phrase - 0 points 0 points 0 points  Total Score - 0 0 0    Immunizations Immunization  History  Administered Date(s) Administered  . Hepatitis B 07/06/1992  . Influenza Nasal 03/29/2011  . Influenza Split 05/02/2012, 05/11/2014  . Influenza Whole 05/28/2008  . Influenza, High Dose Seasonal  PF 04/15/2017, 05/31/2018, 04/28/2019  . Influenza-Unspecified 05/02/2015, 05/06/2016  . Pneumococcal Conjugate-13 03/24/2014  . Pneumococcal Polysaccharide-23 05/28/2000  . Td 05/28/2008  . Tdap 12/27/2010  . Zoster 05/29/2007   Health Maintenance There are no preventive care reminders to display for this patient. Health Maintenance  Topic Date Due  . INFLUENZA VACCINE  06/26/2020 (Originally 02/26/2020)  . COVID-19 Vaccine (1) 06/27/2020 (Originally 10/27/1944)  . TETANUS/TDAP  12/26/2020  . PNA vac Low Risk Adult  Completed    Dental Screening: Recommended annual dental exams for proper oral hygiene.  Community Resource Referral / Chronic Care Management: CRR required this visit?  No   CCM required this visit?  No      Plan:   Keep all routine maintenance appointments.   I have personally reviewed and noted the following in the patient's chart:   . Medical and social history . Use of alcohol, tobacco or illicit drugs  . Current medications and supplements . Functional ability and status . Nutritional status . Physical activity . Advanced directives . List of other physicians . Hospitalizations, surgeries, and ER visits in previous 12 months . Vitals . Screenings to include cognitive, depression, and falls . Referrals and appointments  In addition, I have reviewed and discussed with patient certain preventive protocols, quality metrics, and best practice recommendations. A written personalized care plan for preventive services as well as general preventive health recommendations were provided to patient via mail.     Ashok Pall, LPN   4/97/0263   Agree with plan. Rennie Plowman, NP

## 2020-04-23 NOTE — Patient Instructions (Addendum)
Terry Vaughn , Thank you for taking time to come for your Medicare Wellness Visit. I appreciate your ongoing commitment to your health goals. Please review the following plan we discussed and let me know if I can assist you in the future.   These are the goals we discussed: Goals    . Increase physical activity     I should do more chair exercises using bands       This is a list of the screening recommended for you and due dates:  Health Maintenance  Topic Date Due  . Flu Shot  06/26/2020*  . COVID-19 Vaccine (1) 06/27/2020*  . Tetanus Vaccine  12/26/2020  . Pneumonia vaccines  Completed  *Topic was postponed. The date shown is not the original due date.    Immunizations Immunization History  Administered Date(s) Administered  . Hepatitis B 07/06/1992  . Influenza Nasal 03/29/2011  . Influenza Split 05/02/2012, 05/11/2014  . Influenza Whole 05/28/2008  . Influenza, High Dose Seasonal PF 04/15/2017, 05/31/2018, 04/28/2019  . Influenza-Unspecified 05/02/2015, 05/06/2016  . Pneumococcal Conjugate-13 03/24/2014  . Pneumococcal Polysaccharide-23 05/28/2000  . Td 05/28/2008  . Tdap 12/27/2010  . Zoster 05/29/2007   Advanced directives: completed.    Conditions/risks identified: none new.   Follow up in one year for your annual wellness visit.   Preventive Care 5 Years and Older, Male Preventive care refers to lifestyle choices and visits with your health care provider that can promote health and wellness. What does preventive care include?  A yearly physical exam. This is also called an annual well check.  Dental exams once or twice a year.  Routine eye exams. Ask your health care provider how often you should have your eyes checked.  Personal lifestyle choices, including:  Daily care of your teeth and gums.  Regular physical activity.  Eating a healthy diet.  Avoiding tobacco and drug use.  Limiting alcohol use.  Practicing safe sex.  Taking low doses of  aspirin every day.  Taking vitamin and mineral supplements as recommended by your health care provider. What happens during an annual well check? The services and screenings done by your health care provider during your annual well check will depend on your age, overall health, lifestyle risk factors, and family history of disease. Counseling  Your health care provider may ask you questions about your:  Alcohol use.  Tobacco use.  Drug use.  Emotional well-being.  Home and relationship well-being.  Sexual activity.  Eating habits.  History of falls.  Memory and ability to understand (cognition).  Work and work Astronomer. Screening  You may have the following tests or measurements:  Height, weight, and BMI.  Blood pressure.  Lipid and cholesterol levels. These may be checked every 5 years, or more frequently if you are over 26 years old.  Skin check.  Lung cancer screening. You may have this screening every year starting at age 31 if you have a 30-pack-year history of smoking and currently smoke or have quit within the past 15 years.  Fecal occult blood test (FOBT) of the stool. You may have this test every year starting at age 93.  Flexible sigmoidoscopy or colonoscopy. You may have a sigmoidoscopy every 5 years or a colonoscopy every 10 years starting at age 46.  Prostate cancer screening. Recommendations will vary depending on your family history and other risks.  Hepatitis C blood test.  Hepatitis B blood test.  Sexually transmitted disease (STD) testing.  Diabetes screening. This is done  by checking your blood sugar (glucose) after you have not eaten for a while (fasting). You may have this done every 1-3 years.  Abdominal aortic aneurysm (AAA) screening. You may need this if you are a current or former smoker.  Osteoporosis. You may be screened starting at age 55 if you are at high risk. Talk with your health care provider about your test results,  treatment options, and if necessary, the need for more tests. Vaccines  Your health care provider may recommend certain vaccines, such as:  Influenza vaccine. This is recommended every year.  Tetanus, diphtheria, and acellular pertussis (Tdap, Td) vaccine. You may need a Td booster every 10 years.  Zoster vaccine. You may need this after age 79.  Pneumococcal 13-valent conjugate (PCV13) vaccine. One dose is recommended after age 70.  Pneumococcal polysaccharide (PPSV23) vaccine. One dose is recommended after age 52. Talk to your health care provider about which screenings and vaccines you need and how often you need them. This information is not intended to replace advice given to you by your health care provider. Make sure you discuss any questions you have with your health care provider. Document Released: 08/10/2015 Document Revised: 04/02/2016 Document Reviewed: 05/15/2015 Elsevier Interactive Patient Education  2017 [employer].  Fall Prevention in the Home Falls can cause injuries. They can happen to people of all ages. There are many things you can do to make your home safe and to help prevent falls. What can I do on the outside of my home?  Regularly fix the edges of walkways and driveways and fix any cracks.  Remove anything that might make you trip as you walk through a door, such as a raised step or threshold.  Trim any bushes or trees on the path to your home.  Use bright outdoor lighting.  Clear any walking paths of anything that might make someone trip, such as rocks or tools.  Regularly check to see if handrails are loose or broken. Make sure that both sides of any steps have handrails.  Any raised decks and porches should have guardrails on the edges.  Have any leaves, snow, or ice cleared regularly.  Use sand or salt on walking paths during winter.  Clean up any spills in your garage right away. This includes oil or grease spills. What can I do in the  bathroom?  Use night lights.  Install grab bars by the toilet and in the tub and shower. Do not use towel bars as grab bars.  Use non-skid mats or decals in the tub or shower.  If you need to sit down in the shower, use a plastic, non-slip stool.  Keep the floor dry. Clean up any water that spills on the floor as soon as it happens.  Remove soap buildup in the tub or shower regularly.  Attach bath mats securely with double-sided non-slip rug tape.  Do not have throw rugs and other things on the floor that can make you trip. What can I do in the bedroom?  Use night lights.  Make sure that you have a light by your bed that is easy to reach.  Do not use any sheets or blankets that are too big for your bed. They should not hang down onto the floor.  Have a firm chair that has side arms. You can use this for support while you get dressed.  Do not have throw rugs and other things on the floor that can make you trip. What can  I do in the kitchen?  Clean up any spills right away.  Avoid walking on wet floors.  Keep items that you use a lot in easy-to-reach places.  If you need to reach something above you, use a strong step stool that has a grab bar.  Keep electrical cords out of the way.  Do not use floor polish or wax that makes floors slippery. If you must use wax, use non-skid floor wax.  Do not have throw rugs and other things on the floor that can make you trip. What can I do with my stairs?  Do not leave any items on the stairs.  Make sure that there are handrails on both sides of the stairs and use them. Fix handrails that are broken or loose. Make sure that handrails are as long as the stairways.  Check any carpeting to make sure that it is firmly attached to the stairs. Fix any carpet that is loose or worn.  Avoid having throw rugs at the top or bottom of the stairs. If you do have throw rugs, attach them to the floor with carpet tape.  Make sure that you have a  light switch at the top of the stairs and the bottom of the stairs. If you do not have them, ask someone to add them for you. What else can I do to help prevent falls?  Wear shoes that:  Do not have high heels.  Have rubber bottoms.  Are comfortable and fit you well.  Are closed at the toe. Do not wear sandals.  If you use a stepladder:  Make sure that it is fully opened. Do not climb a closed stepladder.  Make sure that both sides of the stepladder are locked into place.  Ask someone to hold it for you, if possible.  Clearly mark and make sure that you can see:  Any grab bars or handrails.  First and last steps.  Where the edge of each step is.  Use tools that help you move around (mobility aids) if they are needed. These include:  Canes.  Walkers.  Scooters.  Crutches.  Turn on the lights when you go into a dark area. Replace any light bulbs as soon as they burn out.  Set up your furniture so you have a clear path. Avoid moving your furniture around.  If any of your floors are uneven, fix them.  If there are any pets around you, be aware of where they are.  Review your medicines with your doctor. Some medicines can make you feel dizzy. This can increase your chance of falling. Ask your doctor what other things that you can do to help prevent falls. This information is not intended to replace advice given to you by your health care provider. Make sure you discuss any questions you have with your health care provider. Document Released: 05/10/2009 Document Revised: 12/20/2015 Document Reviewed: 08/18/2014 Elsevier Interactive Patient Education  2017 [employer].

## 2020-05-11 ENCOUNTER — Other Ambulatory Visit: Payer: Self-pay

## 2020-05-11 ENCOUNTER — Encounter (INDEPENDENT_AMBULATORY_CARE_PROVIDER_SITE_OTHER): Payer: Self-pay | Admitting: Vascular Surgery

## 2020-05-11 ENCOUNTER — Ambulatory Visit (INDEPENDENT_AMBULATORY_CARE_PROVIDER_SITE_OTHER): Payer: Medicare Other

## 2020-05-11 ENCOUNTER — Ambulatory Visit (INDEPENDENT_AMBULATORY_CARE_PROVIDER_SITE_OTHER): Payer: Medicare Other | Admitting: Vascular Surgery

## 2020-05-11 VITALS — BP 133/75 | HR 75 | Ht 74.0 in | Wt 250.0 lb

## 2020-05-11 DIAGNOSIS — M7989 Other specified soft tissue disorders: Secondary | ICD-10-CM | POA: Diagnosis not present

## 2020-05-11 DIAGNOSIS — I83891 Varicose veins of right lower extremities with other complications: Secondary | ICD-10-CM | POA: Diagnosis not present

## 2020-05-11 DIAGNOSIS — I10 Essential (primary) hypertension: Secondary | ICD-10-CM

## 2020-05-11 DIAGNOSIS — L97519 Non-pressure chronic ulcer of other part of right foot with unspecified severity: Secondary | ICD-10-CM

## 2020-05-11 NOTE — Progress Notes (Signed)
MRN : [id number]  Terry Vaughn is a 86 y.o. ([birth year]) male who presents with chief complaint of  Chief Complaint  Patient presents with  . Follow-up    3 mo U/S follow up  .  History of Present Illness: Patient returns today in follow up of his chronic right foot wound.  This is still present and has not healed.  He follows with podiatry and they are doing an excellent job caring for this.  We reassessed his arterial studies today to make sure he has adequate perfusion for wound healing.  ABIs are 1.2 bilaterally with brisk triphasic waveforms consistent with no arterial insufficiency.  Current Outpatient Medications  Medication Sig Dispense Refill  . aspirin 81 MG chewable tablet Chew 81 mg by mouth daily. (Patient not taking: Reported on 05/11/2020)    . docusate sodium (COLACE) 100 MG capsule Take 100 mg by mouth daily as needed for mild constipation.  (Patient not taking: Reported on 05/11/2020)    . finasteride (PROSCAR) 5 MG tablet TAKE 1 TABLET(5 MG) BY MOUTH DAILY 90 tablet 2  . tamsulosin (FLOMAX) 0.4 MG CAPS capsule TAKE 1 CAPSULE(0.4 MG) BY MOUTH AT BEDTIME (Patient taking differently: Take 0.4 mg by mouth at bedtime. ) 90 capsule 2   No current facility-administered medications for this visit.    Past Medical History:  Diagnosis Date  . Bone spur 1988   left heel  . Bursitis 1987   right shoulder  . Cellulitis 1986   right leg  . Pneumonia    noted 07/17/17 CXR   . Shingles 1959  . Urinary retention   . UTI (urinary tract infection)    sepsis E coli UTI +urine and blood cultures 07/17/17     Past Surgical History:  Procedure Laterality Date  . APPENDECTOMY  1942  . CHOLECYSTECTOMY  1966  . EYE SURGERY  2015   catarcts extracted  . IRRIGATION AND DEBRIDEMENT FOOT Right 12/09/2019   Procedure: IRRIGATION AND DEBRIDEMENT HEEL;  Surgeon: Rosetta Posner, DPM;  Location: [facility] ORS;  Service: Podiatry;  Laterality: Right;  . JOINT REPLACEMENT     right hip 20 +  years from 2019, left hip replaced 05/01/17   . MELANOMA EXCISION  04.2010   mole removal right arm  . TONSILLECTOMY AND ADENOIDECTOMY  1944  . TOTAL HIP ARTHROPLASTY  1999   right hip     Social History   Tobacco Use  . Smoking status: Never Smoker  . Smokeless tobacco: Never Used  Vaping Use  . Vaping Use: Never used  Substance Use Topics  . Alcohol use: No    Alcohol/week: 0.0 standard drinks  . Drug use: No      Family History  Problem Relation Age of Onset  . Clotting disorder Mother   . Heart disease Father 53  . Cancer Sister        breast  . Cerebral aneurysm Brother        hemmorage  . Cancer Daughter        breast     Allergies  Allergen Reactions  . Codeine Other (See Comments)    Alters mental state    REVIEW OF SYSTEMS (Negative unless checked)  Constitutional: [] ?Weight loss  [] ?Fever  [] ?Chills Cardiac: [] ?Chest pain   [] ?Chest pressure   [] ?Palpitations   [] ?Shortness of breath when laying flat   [] ?Shortness of breath at rest   [] ?Shortness of breath with exertion. Vascular:  [] ?Pain in legs with walking   [] ?  Pain in legs at rest   [] ?Pain in legs when laying flat   [] ?Claudication   [] ?Pain in feet when walking  [] ?Pain in feet at rest  [] ?Pain in feet when laying flat   [] ?History of DVT   [] ?Phlebitis   [x] ?Swelling in legs   [x] ?Varicose veins   [x] ?Non-healing ulcers Pulmonary:   [] ?Uses home oxygen   [] ?Productive cough   [] ?Hemoptysis   [] ?Wheeze  [] ?COPD   [] ?Asthma Neurologic:  [] ?Dizziness  [] ?Blackouts   [] ?Seizures   [] ?History of stroke   [] ?History of TIA  [] ?Aphasia   [] ?Temporary blindness   [] ?Dysphagia   [] ?Weakness or numbness in arms   [] ?Weakness or numbness in legs Musculoskeletal:  [x] ?Arthritis   [] ?Joint swelling   [] ?Joint pain   [] ?Low back pain Hematologic:  [] ?Easy bruising  [] ?Easy bleeding   [] ?Hypercoagulable state   [] ?Anemic   Gastrointestinal:  [] ?Blood in stool   [] ?Vomiting blood  [] ?Gastroesophageal  reflux/heartburn   [] ?Abdominal pain Genitourinary:  [] ?Chronic kidney disease   [] ?Difficult urination  [] ?Frequent urination  [] ?Burning with urination   [] ?Hematuria Skin:  [] ?Rashes   [x] ?Ulcers   [x] ?Wounds Psychological:  [] ?History of anxiety   [] ? History of major depression.  Physical Examination  BP 133/75   Pulse 75   Ht 6\' 2"  (1.88 m)   Wt 250 lb (113.4 kg)   BMI 32.10 kg/m  Gen:  WD/WN, NAD Head: [place]/AT, No temporalis wasting. Ear/Nose/Throat: Hearing grossly intact, nares w/o erythema or drainage Eyes: Conjunctiva clear. Sclera non-icteric Neck: Supple.  Trachea midline Pulmonary:  Good air movement, no use of accessory muscles.  Cardiac: RRR, no JVD Vascular:  Vessel Right Left  Radial Palpable Palpable                          PT  trace palpable  1+ palpable  DP  1+ palpable  1+ palpable   Gastrointestinal: soft, non-tender/non-distended. No guarding/reflex.  Musculoskeletal: M/S 5/5 throughout.  No deformity or atrophy.  Trace lower extremity edema. Neurologic: Sensation grossly intact in extremities.  Symmetrical.  Speech is fluent.  Psychiatric: Judgment intact, Mood & affect appropriate for pt's clinical situation. Dermatologic: Right foot wound is currently dressed today.       Labs No results found for this or any previous visit (from the past 2160 hour(s)).  Radiology VAS ABI WITH/WO TBI  Result Date: 05/11/2020 LOWER EXTREMITY DOPPLER STUDY Indications: Right heel slow healing wound.  Comparison Study: 02/17/2020 Performing Technologist: RVT  Examination Guidelines: A complete evaluation includes at minimum, Doppler waveform signals and systolic blood pressure reading at the level of bilateral brachial, anterior tibial, and posterior tibial arteries, when vessel segments are accessible. Bilateral testing is considered an integral part of a complete examination. Photoelectric Plethysmograph (PPG) waveforms and toe systolic  pressure readings are included as required and additional duplex testing as needed. Limited examinations for reoccurring indications may be performed as noted.  ABI Findings: +--------+------------------+-----+---------+--------+ Right   Rt Pressure (mmHg)IndexWaveform Comment  +--------+------------------+-----+---------+--------+ Brachial130                                      +--------+------------------+-----+---------+--------+ ATA     152               1.17 triphasic         +--------+------------------+-----+---------+--------+ PTA     161  1.24 triphasic         +--------+------------------+-----+---------+--------+ +----+------------------+-----+---------+-------+ LeftLt Pressure (mmHg)IndexWaveform Comment +----+------------------+-----+---------+-------+ ATA 165               1.27 triphasic        +----+------------------+-----+---------+-------+ PTA 162               1.25 triphasic        +----+------------------+-----+---------+-------+ +-------+-----------+-----------+------------+------------+ ABI/TBIToday's ABIToday's TBIPrevious ABIPrevious TBI +-------+-----------+-----------+------------+------------+ Right  1.24                  1.19                     +-------+-----------+-----------+------------+------------+ Left   1.27                  1.20                     +-------+-----------+-----------+------------+------------+  Summary: Right: Resting right ankle-brachial index is within normal range. No evidence of significant right lower extremity arterial disease. Left: Resting left ankle-brachial index is within normal range. No evidence of significant left lower extremity arterial disease.  *See table(s) above for measurements and observations.  Electronically signed by Festus Barren MD on 05/11/2020 at 10:11:49 AM.   Final     Assessment/Plan Hypertension blood pressure control important in reducing the progression of  atherosclerotic disease. On appropriate oral medications.  Leg swelling Actually doing quite well with minimal swelling currently.  Right foot ulcer (HCC) ABIs are 1.2 bilaterally with brisk triphasic waveforms consistent with no arterial insufficiency.  I think it would be reasonable in a patient with significant risk factors for limb loss and poor wound healing to continue to follow his perfusion as long as his ulcer persists.  I will plan to see him back in 6 months.  If his ulcer heals, that visit can be canceled and he can return as needed.    Festus Barren, MD  05/11/2020 11:32 AM    This note was created with Dragon medical transcription system.  Any errors from dictation are purely unintentional

## 2020-05-11 NOTE — Assessment & Plan Note (Signed)
Actually doing quite well with minimal swelling currently.

## 2020-05-11 NOTE — Assessment & Plan Note (Signed)
ABIs are 1.2 bilaterally with brisk triphasic waveforms consistent with no arterial insufficiency.  I think it would be reasonable in a patient with significant risk factors for limb loss and poor wound healing to continue to follow his perfusion as long as his ulcer persists.  I will plan to see him back in 6 months.  If his ulcer heals, that visit can be canceled and he can return as needed.

## 2020-05-11 NOTE — Patient Instructions (Signed)
Peripheral Vascular Disease  Peripheral vascular disease (PVD) is a disease of the blood vessels that are not part of your heart and brain. A simple term for PVD is poor circulation. In most cases, PVD narrows the blood vessels that carry blood from your heart to the rest of your body. This can reduce the supply of blood to your arms, legs, and internal organs, like your stomach or kidneys. However, PVD most often affects a person's lower legs and feet. Without treatment, PVD tends to get worse. PVD can also lead to acute ischemic limb. This is when an arm or leg suddenly cannot get enough blood. This is a medical emergency. Follow these instructions at home: Lifestyle  Do not use any products that contain nicotine or tobacco, such as cigarettes and e-cigarettes. If you need help quitting, ask your doctor.  Lose weight if you are overweight. Or, stay at a healthy weight as told by your doctor.  Eat a diet that is low in fat and cholesterol. If you need help, ask your doctor.  Exercise regularly. Ask your doctor for activities that are right for you. General instructions  Take over-the-counter and prescription medicines only as told by your doctor.  Take good care of your feet: ? Wear comfortable shoes that fit well. ? Check your feet often for any cuts or sores.  Keep all follow-up visits as told by your doctor This is important. Contact a doctor if:  You have cramps in your legs when you walk.  You have leg pain when you are at rest.  You have coldness in a leg or foot.  Your skin changes.  You are unable to get or have an erection (erectile dysfunction).  You have cuts or sores on your feet that do not heal. Get help right away if:  Your arm or leg turns cold, numb, and blue.  Your arms or legs become red, warm, swollen, painful, or numb.  You have chest pain.  You have trouble breathing.  You suddenly have weakness in your face, arm, or leg.  You become very  confused or you cannot speak.  You suddenly have a very bad headache.  You suddenly cannot see. Summary  Peripheral vascular disease (PVD) is a disease of the blood vessels.  A simple term for PVD is poor circulation. Without treatment, PVD tends to get worse.  Treatment may include exercise, low fat and low cholesterol diet, and quitting smoking. This information is not intended to replace advice given to you by your health care provider. Make sure you discuss any questions you have with your health care provider. Document Revised: 06/26/2017 Document Reviewed: 08/21/2016 Elsevier Patient Education  2020 [employer]  

## 2020-06-26 ENCOUNTER — Encounter: Payer: Self-pay | Admitting: Urology

## 2020-06-26 ENCOUNTER — Other Ambulatory Visit: Payer: Self-pay

## 2020-06-26 ENCOUNTER — Ambulatory Visit (INDEPENDENT_AMBULATORY_CARE_PROVIDER_SITE_OTHER): Payer: Medicare Other | Admitting: Urology

## 2020-06-26 VITALS — BP 129/72 | HR 109 | Ht 74.0 in | Wt 270.0 lb

## 2020-06-26 DIAGNOSIS — N401 Enlarged prostate with lower urinary tract symptoms: Secondary | ICD-10-CM | POA: Diagnosis not present

## 2020-06-26 DIAGNOSIS — R339 Retention of urine, unspecified: Secondary | ICD-10-CM

## 2020-06-26 DIAGNOSIS — N138 Other obstructive and reflux uropathy: Secondary | ICD-10-CM

## 2020-06-26 LAB — BLADDER SCAN AMB NON-IMAGING: Scan Result: 7

## 2020-06-26 NOTE — Progress Notes (Signed)
06/26/2020 12:53 PM   [name] [birth year] [id number]  Referring provider: Allegra Grana, FNP [address] [place],  [place] [address]  Chief Complaint  Patient presents with  . Benign Prostatic Hypertrophy    HPI: 86 year old male with complex she had she returns today for routine annual follow-up.  He has a personal history of urinary retention, sepsis of urinary source and severe epididymoorchitis.  Over the past two years, his urinary symptoms have been relatively stable.  He is currently on Flomax and finasteride.  He was previously on self catheter for incomplete bladder emptying.  This seems to have improved/resolved.  At last visit a year ago, review of voiding diary indicated that he no longer needed to do this and he since stopped.  No recurrent testicular infections.  No inguinal canal issues.  He has been overall doing well.  No infections this year.  He is accompanied today by his son.   IPSS    Row Name 06/26/20 1100         International Prostate Symptom Score   How often have you had the sensation of not emptying your bladder? Less than 1 in 5     How often have you had to urinate less than every two hours? Less than 1 in 5 times     How often have you found you stopped and started again several times when you urinated? Not at All     How often have you found it difficult to postpone urination? Not at All     How often have you had a weak urinary stream? Less than 1 in 5 times     How often have you had to strain to start urination? Less than 1 in 5 times     How many times did you typically get up at night to urinate? None     Total IPSS Score 4       Quality of Life due to urinary symptoms   If you were to spend the rest of your life with your urinary condition just the way it is now how would you feel about that? Pleased            Score:  1-7 Mild 8-19 Moderate 20-35 Severe   PMH: Past Medical History:  Diagnosis Date   . Bone spur 1988   left heel  . Bursitis 1987   right shoulder  . Cellulitis 1986   right leg  . Pneumonia    noted 07/17/17 CXR   . Shingles 1959  . Urinary retention   . UTI (urinary tract infection)    sepsis E coli UTI +urine and blood cultures 07/17/17     Surgical History: Past Surgical History:  Procedure Laterality Date  . APPENDECTOMY  1942  . CHOLECYSTECTOMY  1966  . EYE SURGERY  2015   catarcts extracted  . IRRIGATION AND DEBRIDEMENT FOOT Right 12/09/2019   Procedure: IRRIGATION AND DEBRIDEMENT HEEL;  Surgeon: Rosetta Posner, DPM;  Location: [facility] ORS;  Service: Podiatry;  Laterality: Right;  . JOINT REPLACEMENT     right hip 20 + years from 2019, left hip replaced 05/01/17   . MELANOMA EXCISION  04.2010   mole removal right arm  . TONSILLECTOMY AND ADENOIDECTOMY  1944  . TOTAL HIP ARTHROPLASTY  1999   right hip    Home Medications:  Allergies as of 06/26/2020      Reactions   Codeine Other (See Comments)  Alters mental state      Medication List       Accurate as of June 26, 2020 12:53 PM. If you have any questions, ask your nurse or doctor.        aspirin 81 MG chewable tablet Chew 81 mg by mouth daily.   docusate sodium 100 MG capsule Commonly known as: COLACE Take 100 mg by mouth daily as needed for mild constipation.   finasteride 5 MG tablet Commonly known as: PROSCAR TAKE 1 TABLET(5 MG) BY MOUTH DAILY   tamsulosin 0.4 MG Caps capsule Commonly known as: FLOMAX TAKE 1 CAPSULE(0.4 MG) BY MOUTH AT BEDTIME What changed: See the new instructions.       Allergies:  Allergies  Allergen Reactions  . Codeine Other (See Comments)    Alters mental state    Family History: Family History  Problem Relation Age of Onset  . Clotting disorder Mother   . Heart disease Father 49  . Cancer Sister        breast  . Cerebral aneurysm Brother        hemmorage  . Cancer Daughter        breast    Social History:  reports that he has  never smoked. He has never used smokeless tobacco. He reports that he does not drink alcohol and does not use drugs.   Physical Exam: BP 129/72   Pulse (!) 109   Ht 6\' 2"  (1.88 m)   Wt 270 lb (122.5 kg)   BMI 34.67 kg/m   Constitutional:  Alert and oriented, No acute distress.  In wheelchair, wearing 2 different shoes.  Accompanied by son. HEENT: [place] AT, moist mucus membranes.  Trachea midline, no masses. Cardiovascular: No clubbing, cyanosis, or edema. Respiratory: Normal respiratory effort, no increased work of breathing. Skin: No rashes, bruises or suspicious lesions. Neurologic: Grossly intact, no focal deficits, moving all 4 extremities. Psychiatric: Normal mood and affect.  Laboratory Data: Lab Results  Component Value Date   WBC 9.8 12/13/2019   HGB 14.1 12/13/2019   HCT 41.3 12/13/2019   MCV 95.4 12/13/2019   PLT 361 12/13/2019    Lab Results  Component Value Date   CREATININE 1.10 12/09/2019    Lab Results  Component Value Date   HGBA1C 5.4 12/07/2019    Pertinent Imaging: Results for orders placed or performed in visit on 06/26/20  Bladder Scan (Post Void Residual) in office  Result Value Ref Range   Scan Result 7      Assessment & Plan:    1. BPH with urinary obstruction Continue Flomax and finasteride  He has been doing well over the past several years, emptying adequately without recurrent infections or retention.  We will continue to follow annually given his extensive history. - Bladder Scan (Post Void Residual) in office - Urinalysis, Complete  2. Incomplete bladder emptying PVR today is minimal, no need to resume self cath - Bladder Scan (Post Void Residual) in office - Urinalysis, Complete   06/28/20, MD  [facility] [address], [address] [place], [place] [phone]

## 2020-06-29 LAB — MICROSCOPIC EXAMINATION: Bacteria, UA: NONE SEEN

## 2020-06-29 LAB — URINALYSIS, COMPLETE
Bilirubin, UA: NEGATIVE
Glucose, UA: NEGATIVE
Ketones, UA: NEGATIVE
Leukocytes,UA: NEGATIVE
Nitrite, UA: NEGATIVE
Protein,UA: NEGATIVE
RBC, UA: NEGATIVE
Specific Gravity, UA: 1.02 (ref 1.005–1.030)
Urobilinogen, Ur: 1 mg/dL (ref 0.2–1.0)
pH, UA: 7 (ref 5.0–7.5)

## 2020-09-25 ENCOUNTER — Other Ambulatory Visit: Payer: Self-pay | Admitting: Urology

## 2020-09-25 DIAGNOSIS — N401 Enlarged prostate with lower urinary tract symptoms: Secondary | ICD-10-CM

## 2020-09-25 DIAGNOSIS — N138 Other obstructive and reflux uropathy: Secondary | ICD-10-CM

## 2020-11-13 ENCOUNTER — Ambulatory Visit (INDEPENDENT_AMBULATORY_CARE_PROVIDER_SITE_OTHER): Payer: Medicare Other

## 2020-11-13 ENCOUNTER — Ambulatory Visit (INDEPENDENT_AMBULATORY_CARE_PROVIDER_SITE_OTHER): Payer: Medicare Other | Admitting: Vascular Surgery

## 2020-11-13 ENCOUNTER — Other Ambulatory Visit: Payer: Self-pay

## 2020-11-13 VITALS — BP 166/75 | HR 73 | Ht 74.0 in | Wt 269.0 lb

## 2020-11-13 DIAGNOSIS — I10 Essential (primary) hypertension: Secondary | ICD-10-CM

## 2020-11-13 DIAGNOSIS — M7989 Other specified soft tissue disorders: Secondary | ICD-10-CM | POA: Diagnosis not present

## 2020-11-13 DIAGNOSIS — L97519 Non-pressure chronic ulcer of other part of right foot with unspecified severity: Secondary | ICD-10-CM | POA: Diagnosis not present

## 2020-11-13 NOTE — Progress Notes (Signed)
MRN : [id number]  Terry Vaughn is a 87 y.o. (1933-04-18) male who presents with chief complaint of  Chief Complaint  Patient presents with  . Follow-up    6 Mo U/S  .  History of Present Illness: Patient returns today in follow up of his PAD and ulceration on the right foot.  Under the care of the podiatrist which has been excellent, his right foot wound has finally healed.  He is doing well from this.  His swelling is also under pretty good control.  We have previously done a venous ablation on him as well.  ABIs today are noncompressible but he has strong triphasic waveforms throughout both lower extremities and normal digital waveforms bilaterally.  Current Outpatient Medications  Medication Sig Dispense Refill  . aspirin 81 MG chewable tablet Chew 81 mg by mouth daily.    Marland Kitchen docusate sodium (COLACE) 100 MG capsule Take 100 mg by mouth daily as needed for mild constipation.    . finasteride (PROSCAR) 5 MG tablet TAKE 1 TABLET(5 MG) BY MOUTH DAILY 90 tablet 2  . tamsulosin (FLOMAX) 0.4 MG CAPS capsule TAKE 1 CAPSULE(0.4 MG) BY MOUTH AT BEDTIME 90 capsule 2   No current facility-administered medications for this visit.    Past Medical History:  Diagnosis Date  . Bone spur 1988   left heel  . Bursitis 1987   right shoulder  . Cellulitis 1986   right leg  . Pneumonia    noted 07/17/17 CXR   . Shingles 1959  . Urinary retention   . UTI (urinary tract infection)    sepsis E coli UTI +urine and blood cultures 07/17/17     Past Surgical History:  Procedure Laterality Date  . APPENDECTOMY  1942  . CHOLECYSTECTOMY  1966  . EYE SURGERY  2015   catarcts extracted  . IRRIGATION AND DEBRIDEMENT FOOT Right 12/09/2019   Procedure: IRRIGATION AND DEBRIDEMENT HEEL;  Surgeon: Rosetta Posner, DPM;  Location: [facility] OR[facility];  Service: Podiatry;  Laterality: Right;  . JOINT REPLACEMENT     right hip 20 + years from 2019, left hip replaced 05/01/17   . MELANOMA EXCISION  04.2010   mole removal  right arm  . TONSILLECTOMY AND ADENOIDECTOMY  1944  . TOTAL HIP ARTHROPLASTY  1999   right hip     Social History   Tobacco Use  . Smoking status: Never Smoker  . Smokeless tobacco: Never Used  Vaping Use  . Vaping Use: Never used  Substance Use Topics  . Alcohol use: No    Alcohol/week: 0.0 standard drinks  . Drug use: No       Family History  Problem Relation Age of Onset  . Clotting disorder Mother   . Heart disease Father 59  . Cancer Sister        breast  . Cerebral aneurysm Brother        hemmorage  . Cancer Daughter        breast     Allergies  Allergen Reactions  . Codeine Other (See Comments)    Alters mental state     REVIEW OF SYSTEMS(Negative unless checked)  Constitutional: [] ??Weight loss [] ??Fever [] ??Chills Cardiac: [] ??Chest pain [] ??Chest pressure [] ??Palpitations [] ??Shortness of breath when laying flat [] ??Shortness of breath at rest [] ??Shortness of breath with exertion. Vascular: [] ??Pain in legs with walking [] ??Pain in legs at rest [] ??Pain in legs when laying flat [] ??Claudication [] ??Pain in feet when walking [] ??Pain in feet at rest [] ??Pain in  feet when laying flat [] ??History of DVT [] ??Phlebitis [x] ??Swelling in legs [x] ??Varicose veins [x] ??Non-healing ulcers Pulmonary: [] ??Uses home oxygen [] ??Productive cough [] ??Hemoptysis [] ??Wheeze [] ??COPD [] ??Asthma Neurologic: [] ??Dizziness [] ??Blackouts [] ??Seizures [] ??History of stroke [] ??History of TIA [] ??Aphasia [] ??Temporary blindness [] ??Dysphagia [] ??Weakness or numbness in arms [] ??Weakness or numbness in legs Musculoskeletal: [x] ??Arthritis [] ??Joint swelling [] ??Joint pain [] ??Low back pain Hematologic: [] ??Easy bruising [] ??Easy bleeding [] ??Hypercoagulable state [] ??Anemic  Gastrointestinal: [] ??Blood in stool [] ??Vomiting blood [] ??Gastroesophageal reflux/heartburn [] ??Abdominal  pain Genitourinary: [] ??Chronic kidney disease [] ??Difficult urination [] ??Frequent urination [] ??Burning with urination [] ??Hematuria Skin: [] ??Rashes [x] ??Ulcers [x] ??Wounds Psychological: [] ??History of anxiety [] ??History of major depression.  Physical Examination  BP (!) 166/75   Pulse 73   Ht 6\' 2"  (1.88 m)   Wt 269 lb (122 kg)   BMI 34.54 kg/m  Gen:  WD/WN, NAD Head: [place]/AT, No temporalis wasting. Ear/Nose/Throat: Hearing grossly intact, nares w/o erythema or drainage Eyes: Conjunctiva clear. Sclera non-icteric Neck: Supple.  Trachea midline Pulmonary:  Good air movement, no use of accessory muscles.  Cardiac: RRR, no JVD Vascular:  Vessel Right Left  Radial Palpable Palpable                          PT Palpable Palpable  DP Palpable Palpable   Gastrointestinal: soft, non-tender/non-distended. No guarding/reflex.  Musculoskeletal: M/S 5/5 throughout.  No deformity or atrophy.  In a wheelchair.  Trace bilateral lower extremity edema. Neurologic: Sensation grossly intact in extremities.  Symmetrical.  Speech is fluent.  Psychiatric: Judgment intact, Mood & affect appropriate for pt's clinical situation. Dermatologic: No rashes or ulcers noted.  No cellulitis or open wounds.       Labs No results found for this or any previous visit (from the past 2160 hour(s)).  Radiology No results found.  Assessment/Plan Hypertension blood pressure control important in reducing the progression of atherosclerotic disease. On appropriate oral medications.  Leg swelling Actually doing quite well with minimal swelling currently.  Right foot ulcer (HCC) This is now healed.  He is at risk of recurrent wounds, but his perfusion is adequate.  Were going to stretch out his follow-up to 6 months at this point    , MD  11/13/2020 12:13 PM    This note was created with Dragon medical transcription system.  Any errors from dictation are purely  unintentional

## 2020-11-13 NOTE — Assessment & Plan Note (Signed)
This is now healed.  He is at risk of recurrent wounds, but his perfusion is adequate.  Were going to stretch out his follow-up to 6 months at this point

## 2021-04-18 ENCOUNTER — Ambulatory Visit (INDEPENDENT_AMBULATORY_CARE_PROVIDER_SITE_OTHER): Payer: Medicare Other | Admitting: Podiatry

## 2021-04-18 ENCOUNTER — Other Ambulatory Visit: Payer: Self-pay

## 2021-04-18 ENCOUNTER — Encounter: Payer: Self-pay | Admitting: Podiatry

## 2021-04-18 DIAGNOSIS — M2030 Hallux varus (acquired), unspecified foot: Secondary | ICD-10-CM | POA: Diagnosis not present

## 2021-04-18 DIAGNOSIS — I739 Peripheral vascular disease, unspecified: Secondary | ICD-10-CM | POA: Diagnosis not present

## 2021-04-18 DIAGNOSIS — L97412 Non-pressure chronic ulcer of right heel and midfoot with fat layer exposed: Secondary | ICD-10-CM

## 2021-04-18 DIAGNOSIS — M79676 Pain in unspecified toe(s): Secondary | ICD-10-CM

## 2021-04-18 DIAGNOSIS — B351 Tinea unguium: Secondary | ICD-10-CM

## 2021-04-18 NOTE — Progress Notes (Signed)
Complaint:  Visit Type: Patient returns to my office for continued preventative foot care services. Complaint: Patient states" my nails have grown long and thick and become painful to walk and wear shoes"  The patient presents for preventative foot care services. No changes to ROS.  Patient was diagnosed with an ulcer right heel.  He was treated and is followed by another podiatrist at another facility.  He requests only nail care be provided today.  Podiatric Exam: Vascular: dorsalis pedis and posterior tibial pulses are palpable bilateral. Capillary return is immediate. Temperature gradient is WNL. Skin turgor WNL  Sensorium: Normal Semmes Weinstein monofilament test. Normal tactile sensation bilaterally. Nail Exam: Pt has thick disfigured discolored nails with subungual debris noted bilateral entire nail hallux through fifth toenails Ulcer Exam: Heel ulcer healing right foot. Orthopedic Exam: Muscle tone and strength are WNL. No limitations in general ROM. No crepitus or effusions noted. Foot type and digits show no abnormalities. Bony prominences are unremarkable. Skin: No Porokeratosis. No infection or ulcers.  Callus noted hallux malleus right hallux.   Diagnosis:  Onychomycosis, , Pain in right toe, pain in left toes    Treatment & Plan Procedures and Treatment: Consent by patient was obtained for treatment procedures. The patient understood the discussion of treatment and procedures well. All questions were answered thoroughly reviewed. Debridement of mycotic and hypertrophic toenails, 1 through 5 bilateral and clearing of subungual debris. No ulceration, no infection noted. Return Visit-Office Procedure: Patient instructed to return to the office for a follow up visit 3 months for continued evaluation and treatment.    Karessa Onorato DPM 

## 2021-04-24 ENCOUNTER — Ambulatory Visit (INDEPENDENT_AMBULATORY_CARE_PROVIDER_SITE_OTHER): Payer: Medicare Other

## 2021-04-24 VITALS — Ht 74.0 in | Wt 269.0 lb

## 2021-04-24 DIAGNOSIS — Z0000 Encounter for general adult medical examination without abnormal findings: Secondary | ICD-10-CM

## 2021-04-24 NOTE — Progress Notes (Signed)
Subjective:   Terry Vaughn is a 87 y.o. male who presents for Medicare Annual/Subsequent preventive examination.  Review of Systems    No ROS.  Medicare Wellness Virtual Visit.  Visual/audio telehealth visit, UTA vital signs.   See social history for additional risk factors.   Cardiac Risk Factors include: advanced age (>8men, >36 women);male gender;hypertension     Objective:    Today's Vitals   04/24/21 1120  Weight: 269 lb (122 kg)  Height: 6\' 2"  (1.88 m)   Body mass index is 34.54 kg/m.  Advanced Directives 04/24/2021 04/23/2020 12/09/2019 12/08/2019 12/07/2019 04/21/2019 11/30/2017  Does Patient Have a Medical Advance Directive? Yes Yes Yes Yes No Yes Yes  Type of 01/30/2018 of [place];Living will Healthcare Power of [place];Living will Healthcare Power of [place];Living will Healthcare Power of [place];Living will - Healthcare Power of [place];Living will Healthcare Power of [place];Living will  Does patient want to make changes to medical advance directive? No - Patient declined No - Patient declined No - Patient declined No - Patient declined - No - Patient declined No - Patient declined  Copy of Healthcare Power of Attorney in Chart? Yes - validated most recent copy scanned in chart (See row information) Yes - validated most recent copy scanned in chart (See row information) No - copy requested Yes - validated most recent copy scanned in chart (See row information) - Yes - validated most recent copy scanned in chart (See row information) Yes  Would patient like information on creating a medical advance directive? - - - No - Patient declined No - Patient declined - -    Current Medications (verified) Outpatient Encounter Medications as of 04/24/2021  Medication Sig   aspirin 81 MG chewable tablet Chew 81 mg by mouth daily.   docusate sodium (COLACE) 100 MG capsule Take 100 mg by mouth daily as needed for mild constipation.   finasteride (PROSCAR)  5 MG tablet TAKE 1 TABLET(5 MG) BY MOUTH DAILY   tamsulosin (FLOMAX) 0.4 MG CAPS capsule TAKE 1 CAPSULE(0.4 MG) BY MOUTH AT BEDTIME   No facility-administered encounter medications on file as of 04/24/2021.    Allergies (verified) Codeine   History: Past Medical History:  Diagnosis Date   Bone spur 1988   left heel   Bursitis 1987   right shoulder   Cellulitis 1986   right leg   Pneumonia    noted 07/17/17 CXR    Shingles 1959   Urinary retention    UTI (urinary tract infection)    sepsis E coli UTI +urine and blood cultures 07/17/17    Past Surgical History:  Procedure Laterality Date   APPENDECTOMY  1942   CHOLECYSTECTOMY  1966   EYE SURGERY  2015   catarcts extracted   IRRIGATION AND DEBRIDEMENT FOOT Right 12/09/2019   Procedure: IRRIGATION AND DEBRIDEMENT HEEL;  Surgeon: 12/11/2019, DPM;  Location: [facility] ORS;  Service: Podiatry;  Laterality: Right;   JOINT REPLACEMENT     right hip 20 + years from 2019, left hip replaced 05/01/17    MELANOMA EXCISION  04.2010   mole removal right arm   TONSILLECTOMY AND ADENOIDECTOMY  1944   TOTAL HIP ARTHROPLASTY  1999   right hip   Family History  Problem Relation Age of Onset   Clotting disorder Mother    Heart disease Father 100   Cancer Sister        breast   Cerebral aneurysm Brother  [place]   Cancer Daughter        breast   Social History   Socioeconomic History   Marital status: Married    Spouse name: Garlan Fair   Number of children: 3   Years of education: 16   Highest education level: Some college, no degree  Occupational History   Not on file  Tobacco Use   Smoking status: Never   Smokeless tobacco: Never  Vaping Use   Vaping Use: Never used  Substance and Sexual Activity   Alcohol use: No    Alcohol/week: 0.0 standard drinks   Drug use: No   Sexual activity: Not Currently  Other Topics Concern   Not on file  Social History Narrative   Lives in [place] with wife.   Diet - regular    Exercise - none   Social Determinants of Health   Financial Resource Strain: Not on file  Food Insecurity: Not on file  Transportation Needs: Not on file  Physical Activity: Not on file  Stress: Not on file  Social Connections: Not on file    Tobacco Counseling Counseling given: Not Answered   Clinical Intake:  Pre-visit preparation completed: Yes        Diabetes: No  How often do you need to have someone help you when you read instructions, pamphlets, or other written materials from your doctor or pharmacy?: 1 - Never    Interpreter Needed?: No      Activities of Daily Living In your present state of health, do you have any difficulty performing the following activities: 04/24/2021  Hearing? N  Vision? N  Difficulty concentrating or making decisions? N  Walking or climbing stairs? Y  Comment Unsteady gait. Walker in use.  Dressing or bathing? N  Doing errands, shopping? Y  Comment He does not drive or run errands on his own  Preparing Food and eating ? N  Using the Toilet? N  In the past six months, have you accidently leaked urine? N  Do you have problems with loss of bowel control? N  Managing your Medications? N  Managing your Finances? N  Housekeeping or managing your Housekeeping? N  Some recent data might be hidden    Patient Care Team: Allegra Grana, FNP as PCP - General (Family Medicine)  Indicate any recent Medical Services you may have received from other than [facility] providers in the past year (date may be approximate).     Assessment:   This is a routine wellness examination for Terry Vaughn.  I connected with [name] today by telephone and verified that I am speaking with the correct person using two identifiers. Location patient: home Location provider: work Persons participating in the virtual visit: patient, Engineer, civil (consulting).    I discussed the limitations, risks, security and privacy concerns of performing an evaluation and management service by  telephone and the availability of in person appointments. The patient expressed understanding and verbally consented to this telephonic visit.    Interactive audio and video telecommunications were attempted between this provider and patient, however failed, due to patient having technical difficulties OR patient did not have access to video capability.  We continued and completed visit with audio only.  Some vital signs may be absent or patient reported.   Hearing/Vision screen Hearing Screening - Comments:: Patient is able to hear conversational tones without difficulty. No issues reported. Vision Screening - Comments:: Followed by [facility] (Dr. Inez Pilgrim)  Wears corrective lenses when reading Cataract extraction, bilateral  They  have regular follow up with the ophthalmologist  Dietary issues and exercise activities discussed: Current Exercise Habits: Home exercise routine;The patient does not participate in regular exercise at present, Intensity: Mild Healthy diet Good water intake   Goals Addressed               This Visit's Progress     Patient Stated     Increase physical activity (pt-stated)        I should do more chair exercises  I should drink more water       Depression Screen PHQ 2/9 Scores 04/24/2021 04/23/2020 01/24/2020 04/21/2019 11/30/2017 11/27/2016 11/28/2015  PHQ - 2 Score 0 0 0 0 0 1 0  PHQ- 9 Score - - - - - 2 -    Fall Risk Fall Risk  04/24/2021 04/23/2020 01/24/2020 04/21/2019 11/30/2017  Falls in the past year? 0 0 0 0 No  Number falls in past yr: - 0 0 - -  Injury with Fall? - - 0 - -  Follow up Falls evaluation completed Falls evaluation completed Falls evaluation completed - -    FALL RISK PREVENTION PERTAINING TO THE HOME: Adequate lighting in your home to reduce risk of falls? Yes   ASSISTIVE DEVICES UTILIZED TO PREVENT FALLS: Life alert? No  Use of a cane, walker or w/c? Yes   TIMED UP AND GO: Was the test performed? No .    Cognitive Function: MMSE - Mini Mental State Exam 11/28/2015  Orientation to time 5  Orientation to Place 5  Registration 3  Attention/ Calculation 5  Recall 3  Language- name 2 objects 2  Language- repeat 1  Language- follow 3 step command 3  Language- read & follow direction 1  Write a sentence 1  Copy design 1  Total score 30     6CIT Screen 04/24/2021 04/23/2020 04/21/2019 11/30/2017 11/27/2016  What Year? 0 points 0 points 0 points 0 points 0 points  What month? 0 points 0 points 0 points 0 points 0 points  What time? 0 points 0 points 0 points 0 points 0 points  Count back from 20 0 points - 0 points 0 points 0 points  Months in reverse 0 points - 0 points 0 points 0 points  Repeat phrase 0 points - 0 points 0 points 0 points  Total Score 0 - 0 0 0    Immunizations Immunization History  Administered Date(s) Administered   Hepatitis B 07/06/1992   Influenza Nasal 03/29/2011   Influenza Split 05/02/2012, 05/11/2014   Influenza Whole 05/28/2008   Influenza, High Dose Seasonal PF 04/15/2017, 05/31/2018, 04/28/2019   Influenza-Unspecified 05/02/2015, 05/06/2016, 06/28/2020   PFIZER(Purple Top)SARS-COV-2 Vaccination 08/12/2019, 09/02/2019   Pneumococcal Conjugate-13 03/24/2014   Pneumococcal Polysaccharide-23 05/28/2000   Td 05/28/2008   Tdap 12/27/2010   Zoster, Live 05/29/2007   TDAP status: Due, Education has been provided regarding the importance of this vaccine. Advised may receive this vaccine at local pharmacy or Health Dept. Aware to provide a copy of the vaccination record if obtained from local pharmacy or Health Dept. Verbalized acceptance and understanding. Deferred.   Shingrix vaccine- Due, Education has been provided regarding the importance of this vaccine. Advised may receive this vaccine at local pharmacy or Health Dept. Aware to provide a copy of the vaccination record if obtained from local pharmacy or Health Dept. Verbalized acceptance and  understanding.Deferred.   Health Maintenance Health Maintenance  Topic Date Due   COVID-19 Vaccine (3 - Booster for [employer]  series) 05/10/2021 (Originally 01/30/2020)   Zoster Vaccines- Shingrix (1 of 2) 07/24/2021 (Originally 10/26/1984)   INFLUENZA VACCINE  10/25/2021 (Originally 02/25/2021)   TETANUS/TDAP  04/24/2022 (Originally 12/26/2020)   HPV VACCINES  Aged Out   Lung Cancer Screening: (Low Dose CT Chest recommended if Age 37-80 years, 30 pack-year currently smoking OR have quit w/in 15years.) does not qualify.   Hepatitis C Screening: does not qualify.  Vision Screening: Recommended annual ophthalmology exams for early detection of glaucoma and other disorders of the eye.  Dental Screening: Recommended annual dental exams for proper oral hygiene.  Community Resource Referral / Chronic Care Management: CRR required this visit?  No   CCM required this visit?  No      Plan:   Keep all routine maintenance appointments.   I have personally reviewed and noted the following in the patient's chart:   Medical and social history Use of alcohol, tobacco or illicit drugs  Current medications and supplements including opioid prescriptions. Patient is not currently taking opioid prescriptions. Functional ability and status Nutritional status Physical activity Advanced directives List of other physicians Hospitalizations, surgeries, and ER visits in previous 12 months Vitals Screenings to include cognitive, depression, and falls Referrals and appointments  In addition, I have reviewed and discussed with patient certain preventive protocols, quality metrics, and best practice recommendations. A written personalized care plan for preventive services as well as general preventive health recommendations were provided to patient via mychart.     Ashok Pall, LPN   1/58/3094

## 2021-04-24 NOTE — Patient Instructions (Addendum)
  Terry Vaughn , Thank you for taking time to come for your Medicare Wellness Visit. I appreciate your ongoing commitment to your health goals. Please review the following plan we discussed and let me know if I can assist you in the future.   These are the goals we discussed:  Goals       Patient Stated     Increase physical activity (pt-stated)      I should do more chair exercises  I should drink more water        This is a list of the screening recommended for you and due dates:  Health Maintenance  Topic Date Due   COVID-19 Vaccine (3 - Booster for [employer] series) 05/10/2021*   Zoster (Shingles) Vaccine (1 of 2) 07/24/2021*   Flu Shot  10/25/2021*   Tetanus Vaccine  04/24/2022*   HPV Vaccine  Aged Out  *Topic was postponed. The date shown is not the original due date.

## 2021-05-17 ENCOUNTER — Ambulatory Visit (INDEPENDENT_AMBULATORY_CARE_PROVIDER_SITE_OTHER): Payer: Medicare Other | Admitting: Nurse Practitioner

## 2021-05-17 ENCOUNTER — Encounter (INDEPENDENT_AMBULATORY_CARE_PROVIDER_SITE_OTHER): Payer: Medicare Other

## 2021-05-22 ENCOUNTER — Other Ambulatory Visit: Payer: Self-pay

## 2021-05-22 ENCOUNTER — Ambulatory Visit (INDEPENDENT_AMBULATORY_CARE_PROVIDER_SITE_OTHER): Payer: Medicare Other | Admitting: Family

## 2021-05-22 ENCOUNTER — Encounter: Payer: Self-pay | Admitting: Family

## 2021-05-22 VITALS — BP 132/80 | HR 74 | Temp 98.7°F | Ht 74.0 in | Wt 257.4 lb

## 2021-05-22 DIAGNOSIS — L97519 Non-pressure chronic ulcer of other part of right foot with unspecified severity: Secondary | ICD-10-CM

## 2021-05-22 DIAGNOSIS — N401 Enlarged prostate with lower urinary tract symptoms: Secondary | ICD-10-CM

## 2021-05-22 DIAGNOSIS — Z0000 Encounter for general adult medical examination without abnormal findings: Secondary | ICD-10-CM

## 2021-05-22 DIAGNOSIS — I10 Essential (primary) hypertension: Secondary | ICD-10-CM

## 2021-05-22 DIAGNOSIS — N138 Other obstructive and reflux uropathy: Secondary | ICD-10-CM

## 2021-05-22 NOTE — Patient Instructions (Addendum)
Always a pleasure seeing you.  Please let me know if I can be of assistance in any way.  Health Maintenance, Male Adopting a healthy lifestyle and getting preventive care are important in promoting health and wellness. Ask your health care provider about: The right schedule for you to have regular tests and exams. Things you can do on your own to prevent diseases and keep yourself healthy. What should I know about diet, weight, and exercise? Eat a healthy diet  Eat a diet that includes plenty of vegetables, fruits, low-fat dairy products, and lean protein. Do not eat a lot of foods that are high in solid fats, added sugars, or sodium. Maintain a healthy weight Body mass index (BMI) is a measurement that can be used to identify possible weight problems. It estimates body fat based on height and weight. Your health care provider can help determine your BMI and help you achieve or maintain a healthy weight. Get regular exercise Get regular exercise. This is one of the most important things you can do for your health. Most adults should: Exercise for at least 150 minutes each week. The exercise should increase your heart rate and make you sweat (moderate-intensity exercise). Do strengthening exercises at least twice a week. This is in addition to the moderate-intensity exercise. Spend less time sitting. Even light physical activity can be beneficial. Watch cholesterol and blood lipids Have your blood tested for lipids and cholesterol at 85 years of age, then have this test every 5 years. You may need to have your cholesterol levels checked more often if: Your lipid or cholesterol levels are high. You are older than 85 years of age. You are at high risk for heart disease. What should I know about cancer screening? Many types of cancers can be detected early and may often be prevented. Depending on your health history and family history, you may need to have cancer screening at various ages. This  may include screening for: Colorectal cancer. Prostate cancer. Skin cancer. Lung cancer. What should I know about heart disease, diabetes, and high blood pressure? Blood pressure and heart disease High blood pressure causes heart disease and increases the risk of stroke. This is more likely to develop in people who have high blood pressure readings, are of African descent, or are overweight. Talk with your health care provider about your target blood pressure readings. Have your blood pressure checked: Every 3-5 years if you are 19-57 years of age. Every year if you are 73 years old or older. If you are between the ages of 71 and 7 and are a current or former smoker, ask your health care provider if you should have a one-time screening for abdominal aortic aneurysm (AAA). Diabetes Have regular diabetes screenings. This checks your fasting blood sugar level. Have the screening done: Once every three years after age 71 if you are at a normal weight and have a low risk for diabetes. More often and at a younger age if you are overweight or have a high risk for diabetes. What should I know about preventing infection? Hepatitis B If you have a higher risk for hepatitis B, you should be screened for this virus. Talk with your health care provider to find out if you are at risk for hepatitis B infection. Hepatitis C Blood testing is recommended for: Everyone born from [birth year] through 1963. Anyone with known risk factors for hepatitis C. Sexually transmitted infections (STIs) You should be screened each year for STIs, including gonorrhea and  chlamydia, if: You are sexually active and are younger than 85 years of age. You are older than 85 years of age and your health care provider tells you that you are at risk for this type of infection. Your sexual activity has changed since you were last screened, and you are at increased risk for chlamydia or gonorrhea. Ask your health care provider if you are  at risk. Ask your health care provider about whether you are at high risk for HIV. Your health care provider may recommend a prescription medicine to help prevent HIV infection. If you choose to take medicine to prevent HIV, you should first get tested for HIV. You should then be tested every 3 months for as long as you are taking the medicine. Follow these instructions at home: Lifestyle Do not use any products that contain nicotine or tobacco, such as cigarettes, e-cigarettes, and chewing tobacco. If you need help quitting, ask your health care provider. Do not use street drugs. Do not share needles. Ask your health care provider for help if you need support or information about quitting drugs. Alcohol use Do not drink alcohol if your health care provider tells you not to drink. If you drink alcohol: Limit how much you have to 0-2 drinks a day. Be aware of how much alcohol is in your drink. In the [place] one drink equals one 12 oz bottle of beer (355 mL), one 5 oz glass of wine (148 mL), or one 1 oz glass of hard liquor (44 mL). General instructions Schedule regular health, dental, and eye exams. Stay current with your vaccines. Tell your health care provider if: You often feel depressed. You have ever been abused or do not feel safe at home. Summary Adopting a healthy lifestyle and getting preventive care are important in promoting health and wellness. Follow your health care provider's instructions about healthy diet, exercising, and getting tested or screened for diseases. Follow your health care provider's instructions on monitoring your cholesterol and blood pressure. This information is not intended to replace advice given to you by your health care provider. Make sure you discuss any questions you have with your health care provider. Document Revised: 09/21/2020 Document Reviewed: 07/07/2018 Elsevier Patient Education  2022 [employer].

## 2021-05-22 NOTE — Progress Notes (Signed)
Subjective:    Patient ID: Terry Vaughn, male    DOB: [birth year], 87 y.o.   MRN: [id number]  CC: Terry Vaughn is a 87 y.o. male who presents today for physical exam.    HPI: Feels well today No new complaints  Right plantar ulceration-following with Terry Vaughn D.P.M. for neuropathic ulcer of right foot.  Last note  05/02/21, advised likely chronic with recurrence and he may seek second opinion at wound center if desired.  Patient reports he will ulcer is chronic and he is changing dressings at home.  He politely declines second opinion with wound care at this time.  Follows with Dr. Vanna Vaughn for BPH who prescribes finasteride, tamsulosin.   History of melanoma- following with [place] [facility]  Colorectal  Cancer Screening: He is no longer screening for colon cancer. Prostate Cancer Screening:Per AUA guidelines, reviewed that do not recommend routine screening in men over age 68.  Lung Cancer Screening: No 30 year pack year history and > 50 years to 80 years.  Immunizations       Tetanus - UTD Labs: Screening labs today. Exercise: Limited by heel ulcer Alcohol use:  none Smoking/tobacco use: Nonsmoker.     HISTORY:  Past Medical History:  Diagnosis Date   Bone spur 1988   left heel   Bursitis 1987   right shoulder   Cellulitis 1986   right leg   Pneumonia    noted 07/17/17 CXR    Shingles 1959   Urinary retention    UTI (urinary tract infection)    sepsis E coli UTI +urine and blood cultures 07/17/17     Past Surgical History:  Procedure Laterality Date   APPENDECTOMY  1942   CHOLECYSTECTOMY  1966   EYE SURGERY  2015   catarcts extracted   IRRIGATION AND DEBRIDEMENT FOOT Right 12/09/2019   Procedure: IRRIGATION AND DEBRIDEMENT HEEL;  Surgeon: Terry Vaughn, DPM;  Location: [facility] OR[facility];  Service: Podiatry;  Laterality: Right;   JOINT REPLACEMENT     right hip 20 + years from 2019, left hip replaced 05/01/17    MELANOMA EXCISION  04.2010   mole removal  right arm   TONSILLECTOMY AND ADENOIDECTOMY  1944   TOTAL HIP ARTHROPLASTY  1999   right hip   Family History  Problem Relation Age of Onset   Clotting disorder Mother    Heart disease Father 29   Cancer Sister        breast   Cerebral aneurysm Brother        hemmorage   Cancer Daughter        breast      ALLERGIES: Codeine  Current Outpatient Medications on File Prior to Visit  Medication Sig Dispense Refill   aspirin 81 MG chewable tablet Chew 81 mg by mouth daily.     docusate sodium (COLACE) 100 MG capsule Take 100 mg by mouth daily as needed for mild constipation.     finasteride (PROSCAR) 5 MG tablet TAKE 1 TABLET(5 MG) BY MOUTH DAILY 90 tablet 2   tamsulosin (FLOMAX) 0.4 MG CAPS capsule TAKE 1 CAPSULE(0.4 MG) BY MOUTH AT BEDTIME 90 capsule 2   No current facility-administered medications on file prior to visit.    Social History   Tobacco Use   Smoking status: Never   Smokeless tobacco: Never  Vaping Use   Vaping Use: Never used  Substance Use Topics   Alcohol use: No    Alcohol/week: 0.0 standard drinks  Drug use: No    Review of Systems  Constitutional:  Negative for chills and fever.  HENT:  Negative for congestion, ear pain, rhinorrhea, sinus pressure and sore throat.   Respiratory:  Negative for cough, shortness of breath and wheezing.   Cardiovascular:  Negative for chest pain and palpitations.  Gastrointestinal:  Negative for diarrhea, nausea and vomiting.  Genitourinary:  Negative for dysuria.  Musculoskeletal:  Negative for myalgias.  Skin:  Positive for wound (right heel). Negative for rash.  Neurological:  Negative for headaches.  Hematological:  Negative for adenopathy.     Objective:    BP 132/80 (BP Location: Left Arm, Patient Position: Sitting, Cuff Size: Large)   Pulse 74   Temp 98.7 F (37.1 C) (Oral)   Ht 6\' 2"  (1.88 m)   Wt 257 lb 6.4 oz (116.8 kg)   SpO2 96%   BMI 33.05 kg/m   BP Readings from Last 3 Encounters:  05/22/21  132/80  11/13/20 (!) 166/75  06/26/20 129/72   Wt Readings from Last 3 Encounters:  05/22/21 257 lb 6.4 oz (116.8 kg)  04/24/21 269 lb (122 kg)  11/13/20 269 lb (122 kg)    Physical Exam Vitals reviewed.  Constitutional:      Appearance: He is well-developed.  Cardiovascular:     Rate and Rhythm: Regular rhythm.     Heart sounds: Normal heart sounds.  Pulmonary:     Effort: Pulmonary effort is normal. No respiratory distress.     Breath sounds: Normal breath sounds. No wheezing, rhonchi or rales.  Musculoskeletal:     Right lower leg: No edema.     Left lower leg: No edema.  Feet:     Comments: Right foot wrapped in bandage Skin:    General: Skin is warm and dry.  Neurological:     Mental Status: He is alert.  Psychiatric:        Speech: Speech normal.        Behavior: Behavior normal.       Assessment & Plan:   Problem List Items Addressed This Visit       Cardiovascular and Mediastinum   Hypertension - Primary   Relevant Orders   Hemoglobin A1c (Completed)   TSH (Completed)   Lipid panel (Completed)   CBC with Differential/Platelet (Completed)   Comprehensive metabolic panel (Completed)     Genitourinary   BPH with urinary obstruction    Chronic, stable.  He is following with Dr. 11/15/20.  Continue finasteride, tamsulosin as prescribed by urology        Other   Right foot ulcer (HCC)    Chronic.  Reiterated that I would place referral to wound care center for second opinion.  Patient politely declines at this time he would like to continue to follow with vascular and podiatry.  He will let me know if he needs anything at all      Routine physical examination    Patient is no longer screening for colon cancer.  He continues to follow with dermatology for  skin exam and I encouraged him to continue to do this.        I am having Terry Vaughn. Terry Vaughn maintain his aspirin, docusate sodium, tamsulosin, and finasteride.   No orders of the defined types  were placed in this encounter.   Return precautions given.   Risks, benefits, and alternatives of the medications and treatment plan prescribed today were discussed, and patient expressed understanding.   Education regarding symptom  management and diagnosis given to patient on AVS.   Continue to follow with Allegra Grana, FNP for routine health maintenance.   [name] and I agreed with plan.   Rennie Plowman, FNP

## 2021-05-23 LAB — CBC WITH DIFFERENTIAL/PLATELET
Basophils Absolute: 0 10*3/uL (ref 0.0–0.1)
Basophils Relative: 0.2 % (ref 0.0–3.0)
Eosinophils Absolute: 0.1 10*3/uL (ref 0.0–0.7)
Eosinophils Relative: 1.4 % (ref 0.0–5.0)
HCT: 40.7 % (ref 39.0–52.0)
Hemoglobin: 14 g/dL (ref 13.0–17.0)
Lymphocytes Relative: 24.6 % (ref 12.0–46.0)
Lymphs Abs: 1.8 10*3/uL (ref 0.7–4.0)
MCHC: 34.4 g/dL (ref 30.0–36.0)
MCV: 96.8 fl (ref 78.0–100.0)
Monocytes Absolute: 0.7 10*3/uL (ref 0.1–1.0)
Monocytes Relative: 9.6 % (ref 3.0–12.0)
Neutro Abs: 4.6 10*3/uL (ref 1.4–7.7)
Neutrophils Relative %: 64.2 % (ref 43.0–77.0)
Platelets: 283 10*3/uL (ref 150.0–400.0)
RBC: 4.21 Mil/uL — ABNORMAL LOW (ref 4.22–5.81)
RDW: 14.6 % (ref 11.5–15.5)
WBC: 7.2 10*3/uL (ref 4.0–10.5)

## 2021-05-23 LAB — COMPREHENSIVE METABOLIC PANEL
ALT: 32 U/L (ref 0–53)
AST: 33 U/L (ref 0–37)
Albumin: 4.2 g/dL (ref 3.5–5.2)
Alkaline Phosphatase: 51 U/L (ref 39–117)
BUN: 15 mg/dL (ref 6–23)
CO2: 27 mEq/L (ref 19–32)
Calcium: 8.9 mg/dL (ref 8.4–10.5)
Chloride: 104 mEq/L (ref 96–112)
Creatinine, Ser: 1.31 mg/dL (ref 0.40–1.50)
GFR: 49.27 mL/min — ABNORMAL LOW (ref >60.00)
Glucose, Bld: 101 mg/dL — ABNORMAL HIGH (ref 70–99)
Potassium: 4.2 mEq/L (ref 3.5–5.1)
Sodium: 140 mEq/L (ref 135–145)
Total Bilirubin: 2.1 mg/dL — ABNORMAL HIGH (ref 0.2–1.2)
Total Protein: 6.5 g/dL (ref 6.0–8.3)

## 2021-05-23 LAB — LIPID PANEL
Cholesterol: 138 mg/dL (ref 0–200)
HDL: 35.7 mg/dL — ABNORMAL LOW (ref >39.00)
LDL Cholesterol: 84 mg/dL (ref 0–99)
NonHDL: 102.39
Total CHOL/HDL Ratio: 4
Triglycerides: 94 mg/dL (ref 0.0–149.0)
VLDL: 18.8 mg/dL (ref 0.0–40.0)

## 2021-05-23 LAB — HEMOGLOBIN A1C: Hgb A1c MFr Bld: 5.3 % (ref 4.6–6.5)

## 2021-05-23 LAB — TSH: TSH: 1.55 u[IU]/mL (ref 0.35–5.50)

## 2021-05-24 NOTE — Assessment & Plan Note (Signed)
Chronic, stable.  He is following with Dr. Apolinar Junes.  Continue finasteride, tamsulosin as prescribed by urology

## 2021-05-24 NOTE — Assessment & Plan Note (Signed)
Patient is no longer screening for colon cancer.  He continues to follow with dermatology for  skin exam and I encouraged him to continue to do this.

## 2021-05-24 NOTE — Assessment & Plan Note (Signed)
Chronic.  Reiterated that I would place referral to wound care center for second opinion.  Patient politely declines at this time he would like to continue to follow with vascular and podiatry.  He will let me know if he needs anything at all

## 2021-05-26 ENCOUNTER — Other Ambulatory Visit: Payer: Self-pay | Admitting: Urology

## 2021-05-26 DIAGNOSIS — N138 Other obstructive and reflux uropathy: Secondary | ICD-10-CM

## 2021-05-26 DIAGNOSIS — N401 Enlarged prostate with lower urinary tract symptoms: Secondary | ICD-10-CM

## 2021-05-29 ENCOUNTER — Other Ambulatory Visit: Payer: Self-pay | Admitting: Family

## 2021-05-29 ENCOUNTER — Telehealth: Payer: Self-pay

## 2021-05-29 DIAGNOSIS — R899 Unspecified abnormal finding in specimens from other organs, systems and tissues: Secondary | ICD-10-CM

## 2021-05-29 NOTE — Telephone Encounter (Signed)
-----   Message from Allegra Grana, FNP sent at 05/29/2021  1:28 PM EDT ----- CALL-  Normal electrolytes. Bilirubin elevated . I have ordered another lab to look at this.  Kidney function also decreased somewhat.  Please ensure that he is avoiding all NSAIDs.  Encourage plenty of water and reschedule labs in 2 weeks time.  I have ordered  Your healthy cholesterol is low so I would tell you to eat more healthy 'fats' such as avocados, nuts, beans, and olive oil in moderation of course.   Otherwise, labs acceptable  Let me know if cannot reach patient.

## 2021-06-13 ENCOUNTER — Other Ambulatory Visit: Payer: Self-pay

## 2021-06-13 ENCOUNTER — Other Ambulatory Visit (INDEPENDENT_AMBULATORY_CARE_PROVIDER_SITE_OTHER): Payer: Medicare Other

## 2021-06-13 DIAGNOSIS — R899 Unspecified abnormal finding in specimens from other organs, systems and tissues: Secondary | ICD-10-CM | POA: Diagnosis not present

## 2021-06-14 LAB — BASIC METABOLIC PANEL
BUN: 17 mg/dL (ref 6–23)
CO2: 28 mEq/L (ref 19–32)
Calcium: 9 mg/dL (ref 8.4–10.5)
Chloride: 104 mEq/L (ref 96–112)
Creatinine, Ser: 1.38 mg/dL (ref 0.40–1.50)
GFR: 46.26 mL/min — ABNORMAL LOW (ref >60.00)
Glucose, Bld: 85 mg/dL (ref 70–99)
Potassium: 4.1 mEq/L (ref 3.5–5.1)
Sodium: 142 mEq/L (ref 135–145)

## 2021-06-14 LAB — BILIRUBIN, FRACTIONATED(TOT/DIR/INDIR)
Bilirubin, Direct: 0.4 mg/dL — ABNORMAL HIGH (ref 0.0–0.2)
Indirect Bilirubin: 1.4 mg/dL (calc) — ABNORMAL HIGH (ref 0.2–1.2)
Total Bilirubin: 1.8 mg/dL — ABNORMAL HIGH (ref 0.2–1.2)

## 2021-06-17 ENCOUNTER — Other Ambulatory Visit: Payer: Self-pay

## 2021-06-17 ENCOUNTER — Other Ambulatory Visit: Payer: Self-pay | Admitting: Family

## 2021-06-17 DIAGNOSIS — N1831 Chronic kidney disease, stage 3a: Secondary | ICD-10-CM

## 2021-06-17 DIAGNOSIS — R17 Unspecified jaundice: Secondary | ICD-10-CM

## 2021-06-25 ENCOUNTER — Other Ambulatory Visit: Payer: Self-pay

## 2021-06-25 ENCOUNTER — Ambulatory Visit (INDEPENDENT_AMBULATORY_CARE_PROVIDER_SITE_OTHER): Payer: Medicare Other

## 2021-06-25 ENCOUNTER — Ambulatory Visit (INDEPENDENT_AMBULATORY_CARE_PROVIDER_SITE_OTHER): Payer: Medicare Other | Admitting: Vascular Surgery

## 2021-06-25 ENCOUNTER — Encounter (INDEPENDENT_AMBULATORY_CARE_PROVIDER_SITE_OTHER): Payer: Self-pay | Admitting: Vascular Surgery

## 2021-06-25 VITALS — BP 159/88 | HR 60 | Ht 74.0 in | Wt 260.0 lb

## 2021-06-25 DIAGNOSIS — L97519 Non-pressure chronic ulcer of other part of right foot with unspecified severity: Secondary | ICD-10-CM | POA: Diagnosis not present

## 2021-06-25 DIAGNOSIS — L97511 Non-pressure chronic ulcer of other part of right foot limited to breakdown of skin: Secondary | ICD-10-CM

## 2021-06-25 DIAGNOSIS — I10 Essential (primary) hypertension: Secondary | ICD-10-CM

## 2021-06-25 DIAGNOSIS — M7989 Other specified soft tissue disorders: Secondary | ICD-10-CM | POA: Diagnosis not present

## 2021-06-25 NOTE — Progress Notes (Signed)
MRN : [id number]  Terry Vaughn is a 87 y.o. (02/15/1933) male who presents with chief complaint of  Chief Complaint  Patient presents with   Follow-up    6 Mo U/S  .  History of Present Illness: Patient returns today in follow up of his nonhealing ulceration of the right foot as well as leg swelling.  His leg swelling is currently very well controlled with no to minimal leg swelling most days.  His ulceration persists.  He continues to follow with the podiatrist.  It is not painful.  No fevers or chills.  They are using antibiotic ointment over the area. ABIs today are noncompressible but the digit pressures remain normal and the waveforms remain multiphasic.   Current Outpatient Medications  Medication Sig Dispense Refill   aspirin 81 MG chewable tablet Chew 81 mg by mouth daily.     docusate sodium (COLACE) 100 MG capsule Take 100 mg by mouth daily as needed for mild constipation.     finasteride (PROSCAR) 5 MG tablet TAKE 1 TABLET(5 MG) BY MOUTH DAILY 90 tablet 0   tamsulosin (FLOMAX) 0.4 MG CAPS capsule TAKE 1 CAPSULE(0.4 MG) BY MOUTH AT BEDTIME 90 capsule 0   No current facility-administered medications for this visit.    Past Medical History:  Diagnosis Date   Bone spur 1988   left heel   Bursitis 1987   right shoulder   Cellulitis 1986   right leg   Pneumonia    noted 07/17/17 CXR    Shingles 1959   Urinary retention    UTI (urinary tract infection)    sepsis E coli UTI +urine and blood cultures 07/17/17     Past Surgical History:  Procedure Laterality Date   APPENDECTOMY  1942   CHOLECYSTECTOMY  1966   EYE SURGERY  2015   catarcts extracted   IRRIGATION AND DEBRIDEMENT FOOT Right 12/09/2019   Procedure: IRRIGATION AND DEBRIDEMENT HEEL;  Surgeon: Caroline More, DPM;  Location: [facility];  Service: Podiatry;  Laterality: Right;   JOINT REPLACEMENT     right hip 20 + years from 2019, left hip replaced 05/01/17    MELANOMA EXCISION  04.2010   mole removal right  arm   TONSILLECTOMY AND ADENOIDECTOMY  1944   TOTAL HIP ARTHROPLASTY  1999   right hip     Social History   Tobacco Use   Smoking status: Never   Smokeless tobacco: Never  Vaping Use   Vaping Use: Never used  Substance Use Topics   Alcohol use: No    Alcohol/week: 0.0 standard drinks   Drug use: No      Family History  Problem Relation Age of Onset   Clotting disorder Mother    Heart disease Father 71   Cancer Sister        breast   Cerebral aneurysm Brother        hemmorage   Cancer Daughter        breast     Allergies  Allergen Reactions   Codeine Other (See Comments)    Alters mental state    REVIEW OF SYSTEMS (Negative unless checked)   Constitutional: [] Weight loss  [] Fever  [] Chills Cardiac: [] Chest pain   [] Chest pressure   [] Palpitations   [] Shortness of breath when laying flat   [] Shortness of breath at rest   [] Shortness of breath with exertion. Vascular:  [] Pain in legs with walking   [] Pain in legs at rest   [] Pain in  legs when laying flat   [] Claudication   [] Pain in feet when walking  [] Pain in feet at rest  [] Pain in feet when laying flat   [] History of DVT   [] Phlebitis   [x] Swelling in legs   [x] Varicose veins   [x] Non-healing ulcers Pulmonary:   [] Uses home oxygen   [] Productive cough   [] Hemoptysis   [] Wheeze  [] COPD   [] Asthma Neurologic:  [] Dizziness  [] Blackouts   [] Seizures   [] History of stroke   [] History of TIA  [] Aphasia   [] Temporary blindness   [] Dysphagia   [] Weakness or numbness in arms   [] Weakness or numbness in legs Musculoskeletal:  [x] Arthritis   [] Joint swelling   [] Joint pain   [] Low back pain Hematologic:  [] Easy bruising  [] Easy bleeding   [] Hypercoagulable state   [] Anemic   Gastrointestinal:  [] Blood in stool   [] Vomiting blood  [] Gastroesophageal reflux/heartburn   [] Abdominal pain Genitourinary:  [] Chronic kidney disease   [] Difficult urination  [] Frequent urination  [] Burning with urination   [] Hematuria Skin:  [] Rashes    [x] Ulcers   [x] Wounds Psychological:  [] History of anxiety   []  History of major depression.  Physical Examination  BP (!) 159/88   Pulse 60   Ht 6\' 2"  (1.88 m)   Wt 260 lb (117.9 kg)   BMI 33.38 kg/m  Gen:  WD/WN, NAD Head: [place]/AT, No temporalis wasting. Ear/Nose/Throat: Hearing grossly intact, nares w/o erythema or drainage Eyes: Conjunctiva clear. Sclera non-icteric Neck: Supple.  Trachea midline Pulmonary:  Good air movement, no use of accessory muscles.  Cardiac: RRR, no JVD Vascular:  Vessel Right Left  Radial Palpable Palpable                          PT Palpable Palpable  DP Palpable Palpable   Gastrointestinal: soft, non-tender/non-distended. No guarding/reflex.  Musculoskeletal: M/S 5/5 throughout.  No deformity or atrophy.  In a wheelchair.  Right foot wound is dressed.  No significant lower extremity edema. Neurologic: Sensation grossly intact in extremities.  Symmetrical.  Speech is fluent.  Psychiatric: Judgment intact, Mood & affect appropriate for pt's clinical situation. Dermatologic: Right foot wound is dressed      Labs Recent Results (from the past 2160 hour(s))  Comprehensive metabolic panel     Status: Abnormal   Collection Time: 05/22/21  3:53 PM  Result Value Ref Range   Sodium 140 135 - 145 mEq/L   Potassium 4.2 3.5 - 5.1 mEq/L   Chloride 104 96 - 112 mEq/L   CO2 27 19 - 32 mEq/L   Glucose, Bld 101 (H) 70 - 99 mg/dL   BUN 15 6 - 23 mg/dL   Creatinine, Ser 1.31 0.40 - 1.50 mg/dL   Total Bilirubin 2.1 (H) 0.2 - 1.2 mg/dL   Alkaline Phosphatase 51 39 - 117 U/L   AST 33 0 - 37 U/L   ALT 32 0 - 53 U/L   Total Protein 6.5 6.0 - 8.3 g/dL   Albumin 4.2 3.5 - 5.2 g/dL   GFR 49.27 (L) >60.00 mL/min    Comment: Calculated using the CKD-EPI Creatinine Equation (2021)   Calcium 8.9 8.4 - 10.5 mg/dL  CBC with Differential/Platelet     Status: Abnormal   Collection Time: 05/22/21  3:53 PM  Result Value Ref Range   WBC 7.2 4.0 - 10.5 K/uL    RBC 4.21 (L) 4.22 - 5.81 Mil/uL   Hemoglobin 14.0 13.0 - 17.0 g/dL  HCT 40.7 39.0 - 52.0 %   MCV 96.8 78.0 - 100.0 fl   MCHC 34.4 30.0 - 36.0 g/dL   RDW 01.0 93.2 - 35.5 %   Platelets 283.0 150.0 - 400.0 K/uL   Neutrophils Relative % 64.2 43.0 - 77.0 %   Lymphocytes Relative 24.6 12.0 - 46.0 %   Monocytes Relative 9.6 3.0 - 12.0 %   Eosinophils Relative 1.4 0.0 - 5.0 %   Basophils Relative 0.2 0.0 - 3.0 %   Neutro Abs 4.6 1.4 - 7.7 K/uL   Lymphs Abs 1.8 0.7 - 4.0 K/uL   Monocytes Absolute 0.7 0.1 - 1.0 K/uL   Eosinophils Absolute 0.1 0.0 - 0.7 K/uL   Basophils Absolute 0.0 0.0 - 0.1 K/uL  Lipid panel     Status: Abnormal   Collection Time: 05/22/21  3:53 PM  Result Value Ref Range   Cholesterol 138 0 - 200 mg/dL    Comment: ATP III Classification       Desirable:  < 200 mg/dL               Borderline High:  200 - 239 mg/dL          High:  > = 732 mg/dL   Triglycerides 20.2 0.0 - 149.0 mg/dL    Comment: Normal:  <542 mg/dLBorderline High:  150 - 199 mg/dL   HDL 70.62 (L) >37.62 mg/dL   VLDL 83.1 0.0 - 51.7 mg/dL   LDL Cholesterol 84 0 - 99 mg/dL   Total CHOL/HDL Ratio 4     Comment:                Men          Women1/2 Average Risk     3.4          3.3Average Risk          5.0          4.42X Average Risk          9.6          7.13X Average Risk          15.0          11.0                       NonHDL 102.39     Comment: NOTE:  Non-HDL goal should be 30 mg/dL higher than patient's LDL goal (i.e. LDL goal of < 70 mg/dL, would have non-HDL goal of < 100 mg/dL)  TSH     Status: None   Collection Time: 05/22/21  3:53 PM  Result Value Ref Range   TSH 1.55 0.35 - 5.50 uIU/mL  Hemoglobin A1c     Status: None   Collection Time: 05/22/21  3:53 PM  Result Value Ref Range   Hgb A1c MFr Bld 5.3 4.6 - 6.5 %    Comment: Glycemic Control Guidelines for People with Diabetes:Non Diabetic:  <6%Goal of Therapy: <7%Additional Action Suggested:  >8%   Bilirubin, fractionated(tot/dir/indir)      Status: Abnormal   Collection Time: 06/13/21  2:42 PM  Result Value Ref Range   Total Bilirubin 1.8 (H) 0.2 - 1.2 mg/dL   Bilirubin, Direct 0.4 (H) 0.0 - 0.2 mg/dL   Indirect Bilirubin 1.4 (H) 0.2 - 1.2 mg/dL (calc)  Basic metabolic panel     Status: Abnormal   Collection Time: 06/13/21  2:42 PM  Result Value Ref Range   Sodium 142  135 - 145 mEq/L   Potassium 4.1 3.5 - 5.1 mEq/L   Chloride 104 96 - 112 mEq/L   CO2 28 19 - 32 mEq/L   Glucose, Bld 85 70 - 99 mg/dL   BUN 17 6 - 23 mg/dL   Creatinine, Ser 1.38 0.40 - 1.50 mg/dL   GFR 46.26 (L) >60.00 mL/min    Comment: Calculated using the CKD-EPI Creatinine Equation (2021)   Calcium 9.0 8.4 - 10.5 mg/dL    Radiology No results found.  Assessment/Plan Hypertension blood pressure control important in reducing the progression of atherosclerotic disease. On appropriate oral medications.   Leg swelling Actually doing quite well with minimal swelling currently.  Toe ulcer, right (HCC) ABIs today are noncompressible but the digit pressures remain normal and the waveforms remain multiphasic.  Perfusion appears adequate for wound healing.  No role for intervention.  At this point, I am going to check him annually.    Leotis Pain, MD  06/25/2021 9:16 AM    This note was created with Dragon medical transcription system.  Any errors from dictation are purely unintentional

## 2021-06-25 NOTE — Assessment & Plan Note (Signed)
ABIs today are noncompressible but the digit pressures remain normal and the waveforms remain multiphasic.  Perfusion appears adequate for wound healing.  No role for intervention.  At this point, I am going to check him annually.

## 2021-06-25 NOTE — Progress Notes (Signed)
06/26/21 10:38 AM   [name] [birth year] [id number]  Referring provider:  Allegra Grana, FNP [address] [place],  [place] [address] Chief Complaint  Patient presents with   Benign Prostatic Hypertrophy     HPI: Terry Vaughn is a 87 y.o.male with a personal history of urinary retention, sepsis of urinary source, and severe epididymorchitis, who presents today for 1 year follow-up with IPSS, PVR, and UA.  He previously self catheterized for incomplete bladder emptying after a bout of urinary retention and severe epididymoorchitis.  After being medically optimized, his PVRs continue to decline and ultimately was able to stop CIC.  He is currently on Flomax and finasteride.  He is doing well today he is accompanied by his son. He reports that he is still taking his finasteride and Flomax. He denies any testicular pain.  He feels like he is able to empty his bladder today.  Voiding symptoms as outlined below.  He has no significant complaints.  No UTIs this year.   IPSS     Row Name 06/26/21 1000         International Prostate Symptom Score   How often have you had the sensation of not emptying your bladder? Not at All     How often have you had to urinate less than every two hours? About half the time     How often have you found you stopped and started again several times when you urinated? Less than 1 in 5 times     How often have you found it difficult to postpone urination? Not at All     How often have you had a weak urinary stream? About half the time     How often have you had to strain to start urination? Less than 1 in 5 times     How many times did you typically get up at night to urinate? 2 Times     Total IPSS Score 10       Quality of Life due to urinary symptoms   If you were to spend the rest of your life with your urinary condition just the way it is now how would you feel about that? Pleased              Score:  1-7 Mild 8-19  Moderate 20-35 Severe     PMH: Past Medical History:  Diagnosis Date   Bone spur 1988   left heel   Bursitis 1987   right shoulder   Cellulitis 1986   right leg   Pneumonia    noted 07/17/17 CXR    Shingles 1959   Urinary retention    UTI (urinary tract infection)    sepsis E coli UTI +urine and blood cultures 07/17/17     Surgical History: Past Surgical History:  Procedure Laterality Date   APPENDECTOMY  1942   CHOLECYSTECTOMY  1966   EYE SURGERY  2015   catarcts extracted   IRRIGATION AND DEBRIDEMENT FOOT Right 12/09/2019   Procedure: IRRIGATION AND DEBRIDEMENT HEEL;  Surgeon: Rosetta Posner, DPM;  Location: [facility] ORS;  Service: Podiatry;  Laterality: Right;   JOINT REPLACEMENT     right hip 20 + years from 2019, left hip replaced 05/01/17    MELANOMA EXCISION  04.2010   mole removal right arm   TONSILLECTOMY AND ADENOIDECTOMY  1944   TOTAL HIP ARTHROPLASTY  1999   right hip    Home Medications:  Allergies as  of 06/26/2021       Reactions   Codeine Other (See Comments)   Alters mental state        Medication List        Accurate as of June 26, 2021 10:38 AM. If you have any questions, ask your nurse or doctor.          aspirin 81 MG chewable tablet Chew 81 mg by mouth daily.   docusate sodium 100 MG capsule Commonly known as: COLACE Take 100 mg by mouth daily as needed for mild constipation.   finasteride 5 MG tablet Commonly known as: PROSCAR TAKE 1 TABLET(5 MG) BY MOUTH DAILY   tamsulosin 0.4 MG Caps capsule Commonly known as: FLOMAX TAKE 1 CAPSULE(0.4 MG) BY MOUTH AT BEDTIME        Allergies:  Allergies  Allergen Reactions   Codeine Other (See Comments)    Alters mental state    Family History: Family History  Problem Relation Age of Onset   Clotting disorder Mother    Heart disease Father 82   Cancer Sister        breast   Cerebral aneurysm Brother        hemmorage   Cancer Daughter        breast    Social  History:  reports that he has never smoked. He has never used smokeless tobacco. He reports that he does not drink alcohol and does not use drugs.   Physical Exam: BP (!) 152/69   Pulse 76   Ht 6\' 2"  (1.88 m)   Wt 260 lb (117.9 kg)   BMI 33.38 kg/m   Constitutional:  Alert and oriented, No acute distress.  Ambulating with walker.  Accompanied by son today. HEENT: [place] AT, moist mucus membranes.  Trachea midline, no masses. Cardiovascular: No clubbing, cyanosis, or edema. Respiratory: Normal respiratory effort, no increased work of breathing. Skin: No rashes, bruises or suspicious lesions. Neurologic: Grossly intact, no focal deficits, moving all 4 extremities. Psychiatric: Normal mood and affect.  Laboratory Data:  Lab Results  Component Value Date   CREATININE 1.38 06/13/2021    Lab Results  Component Value Date   HGBA1C 5.3 05/22/2021    Urinalysis Results for orders placed or performed in visit on 06/26/21  Microscopic Examination   Urine  Result Value Ref Range   WBC, UA 0-5 0 - 5 /hpf   RBC 0-2 0 - 2 /hpf   Epithelial Cells (non renal) 0-10 0 - 10 /hpf   Bacteria, UA None seen None seen/Few  Urinalysis, Complete  Result Value Ref Range   Specific Gravity, UA 1.015 1.005 - 1.030   pH, UA 7.0 5.0 - 7.5   Color, UA Yellow Yellow   Appearance Ur Clear Clear   Leukocytes,UA Trace (A) Negative   Protein,UA Negative Negative/Trace   Glucose, UA Negative Negative   Ketones, UA Negative Negative   RBC, UA Trace (A) Negative   Bilirubin, UA Negative Negative   Urobilinogen, Ur 0.2 0.2 - 1.0 mg/dL   Nitrite, UA Negative Negative   Microscopic Examination See below:   Bladder Scan (Post Void Residual) in office  Result Value Ref Range   Scan Result 52      Assessment & Plan:    BPH with urinary obstruction -He has been doing well over the past several years, emptying adequately without recurrent infections or retention.  -We will continue to follow annually given  his extensive GU history. - Continue Flomax and  finasteride  - Bladder Scan (Post Void Residual) in office - Urinalysis, Complete  Return in 1 year for IPSS, PVR, and UA  I,Kailey Littlejohn,acting as a scribe for Vanna Scotland, MD.,have documented all relevant documentation on the behalf of Vanna Scotland, MD,as directed by  Vanna Scotland, MD while in the presence of Vanna Scotland, MD.  I have reviewed the above documentation for accuracy and completeness, and I agree with the above.   Vanna Scotland, MD  [facility] [address] [place], [place] [address] [phone]

## 2021-06-26 ENCOUNTER — Ambulatory Visit (INDEPENDENT_AMBULATORY_CARE_PROVIDER_SITE_OTHER): Payer: Medicare Other | Admitting: Urology

## 2021-06-26 ENCOUNTER — Encounter: Payer: Self-pay | Admitting: Urology

## 2021-06-26 VITALS — BP 152/69 | HR 76 | Ht 74.0 in | Wt 260.0 lb

## 2021-06-26 DIAGNOSIS — N401 Enlarged prostate with lower urinary tract symptoms: Secondary | ICD-10-CM

## 2021-06-26 DIAGNOSIS — N138 Other obstructive and reflux uropathy: Secondary | ICD-10-CM | POA: Diagnosis not present

## 2021-06-26 LAB — URINALYSIS, COMPLETE
Bilirubin, UA: NEGATIVE
Glucose, UA: NEGATIVE
Ketones, UA: NEGATIVE
Nitrite, UA: NEGATIVE
Protein,UA: NEGATIVE
Specific Gravity, UA: 1.015 (ref 1.005–1.030)
Urobilinogen, Ur: 0.2 mg/dL (ref 0.2–1.0)
pH, UA: 7 (ref 5.0–7.5)

## 2021-06-26 LAB — MICROSCOPIC EXAMINATION: Bacteria, UA: NONE SEEN

## 2021-06-26 LAB — BLADDER SCAN AMB NON-IMAGING: Scan Result: 52

## 2021-06-28 ENCOUNTER — Other Ambulatory Visit: Payer: Self-pay

## 2021-06-28 ENCOUNTER — Ambulatory Visit
Admission: RE | Admit: 2021-06-28 | Discharge: 2021-06-28 | Disposition: A | Discharge status: Home / Self Care | Payer: Medicare Other | Source: Ambulatory Visit | Attending: Family | Admitting: Family

## 2021-06-28 DIAGNOSIS — R17 Unspecified jaundice: Secondary | ICD-10-CM | POA: Insufficient documentation

## 2021-07-02 ENCOUNTER — Telehealth: Payer: Self-pay

## 2021-07-02 ENCOUNTER — Telehealth: Payer: Self-pay | Admitting: Family

## 2021-07-02 DIAGNOSIS — R17 Unspecified jaundice: Secondary | ICD-10-CM

## 2021-07-02 NOTE — Telephone Encounter (Signed)
Can you call pt and see what specific questions he has? I cosigned referral to GI

## 2021-07-02 NOTE — Telephone Encounter (Signed)
Pls give pt a call back. He called in regards to what you all spoke about previously. Pt still has a few questions he needs clarified.

## 2021-07-02 NOTE — Telephone Encounter (Signed)
I called pt to give pt the number to [facility] to sch. Pt does not want the referral to be sent. Pt asked what it was for I stated what the Dx Is for and I advised for pt to call and they would better explain to him and that i'm the referral coord. Pt insisted to cancel the referral. Thank you!

## 2021-07-02 NOTE — Telephone Encounter (Signed)
-----   Message from Allegra Grana, FNP sent at 07/02/2021  8:43 AM EST ----- CALL-  Liver ultrasound shows suspected fatty liver and I advise further evaluation with GI to ensure complete work up particularly in setting of elevated bilirubin Please place referral for elevated bilirubin,hepatic disease on ultrasound to [facility].  Let us know if you dont hear back within a week in regards to an appointment being scheduled.     Let me know if cannot reach patient.

## 2021-07-02 NOTE — Telephone Encounter (Signed)
Patient stated he wants to hold off on GI appointment for now. He has too much going on at the moment.

## 2021-07-03 NOTE — Telephone Encounter (Signed)
noted 

## 2021-07-03 NOTE — Telephone Encounter (Signed)
Just FYI patient still declined referral even after giving him reasons to be seen. He stated that he will go to neprology & that he has so much going on with his foot as well. He just doesn't want to take more on at this time.

## 2021-07-03 NOTE — Telephone Encounter (Signed)
Noted Declines nephrology consult

## 2021-07-18 ENCOUNTER — Other Ambulatory Visit: Payer: Self-pay

## 2021-07-18 ENCOUNTER — Encounter: Payer: Self-pay | Admitting: Podiatry

## 2021-07-18 ENCOUNTER — Ambulatory Visit (INDEPENDENT_AMBULATORY_CARE_PROVIDER_SITE_OTHER): Payer: Medicare Other | Admitting: Podiatry

## 2021-07-18 DIAGNOSIS — M79676 Pain in unspecified toe(s): Secondary | ICD-10-CM | POA: Diagnosis not present

## 2021-07-18 DIAGNOSIS — B351 Tinea unguium: Secondary | ICD-10-CM

## 2021-07-18 DIAGNOSIS — I739 Peripheral vascular disease, unspecified: Secondary | ICD-10-CM

## 2021-07-18 DIAGNOSIS — M2030 Hallux varus (acquired), unspecified foot: Secondary | ICD-10-CM | POA: Diagnosis not present

## 2021-07-18 NOTE — Progress Notes (Signed)
Complaint:  Visit Type: Patient returns to my office for continued preventative foot care services. Complaint: Patient states" my nails have grown long and thick and become painful to walk and wear shoes"  The patient presents for preventative foot care services. No changes to ROS.  Patient was diagnosed with an ulcer right heel.  He was treated and is followed by another podiatrist at another facility.  He requests only nail care be provided today.  Podiatric Exam: Vascular: dorsalis pedis and posterior tibial pulses are palpable bilateral. Capillary return is immediate. Temperature gradient is WNL. Skin turgor WNL  Sensorium: Normal Semmes Weinstein monofilament test. Normal tactile sensation bilaterally. Nail Exam: Pt has thick disfigured discolored nails with subungual debris noted bilateral entire nail hallux through fifth toenails Ulcer Exam: Heel ulcer healing right foot. Orthopedic Exam: Muscle tone and strength are WNL. No limitations in general ROM. No crepitus or effusions noted. Foot type and digits show no abnormalities. Bony prominences are unremarkable. Skin: No Porokeratosis. No infection or ulcers.  Callus noted hallux malleus right hallux.   Diagnosis:  Onychomycosis, , Pain in right toe, pain in left toes    Treatment & Plan Procedures and Treatment: Consent by patient was obtained for treatment procedures. The patient understood the discussion of treatment and procedures well. All questions were answered thoroughly reviewed. Debridement of mycotic and hypertrophic toenails, 1 through 5 bilateral and clearing of subungual debris. No ulceration, no infection noted. Return Visit-Office Procedure: Patient instructed to return to the office for a follow up visit 3 months for continued evaluation and treatment.    Jameil Whitmoyer DPM 

## 2021-07-24 ENCOUNTER — Other Ambulatory Visit: Payer: Self-pay | Admitting: Nephrology

## 2021-07-24 DIAGNOSIS — N1831 Chronic kidney disease, stage 3a: Secondary | ICD-10-CM

## 2021-08-01 ENCOUNTER — Other Ambulatory Visit: Payer: Self-pay

## 2021-08-01 ENCOUNTER — Ambulatory Visit
Admission: RE | Admit: 2021-08-01 | Discharge: 2021-08-01 | Disposition: A | Discharge status: Home / Self Care | Payer: Medicare Other | Source: Ambulatory Visit | Attending: Nephrology | Admitting: Nephrology

## 2021-08-01 DIAGNOSIS — N1831 Chronic kidney disease, stage 3a: Secondary | ICD-10-CM | POA: Insufficient documentation

## 2021-08-24 ENCOUNTER — Other Ambulatory Visit: Payer: Self-pay | Admitting: Urology

## 2021-08-24 DIAGNOSIS — N138 Other obstructive and reflux uropathy: Secondary | ICD-10-CM

## 2021-08-28 ENCOUNTER — Other Ambulatory Visit (INDEPENDENT_AMBULATORY_CARE_PROVIDER_SITE_OTHER): Payer: Self-pay | Admitting: Nephrology

## 2021-08-28 DIAGNOSIS — N1831 Chronic kidney disease, stage 3a: Secondary | ICD-10-CM

## 2021-09-12 ENCOUNTER — Other Ambulatory Visit: Payer: Self-pay

## 2021-09-12 ENCOUNTER — Ambulatory Visit (INDEPENDENT_AMBULATORY_CARE_PROVIDER_SITE_OTHER): Payer: Medicare Other

## 2021-09-12 DIAGNOSIS — N1831 Chronic kidney disease, stage 3a: Secondary | ICD-10-CM

## 2021-10-24 ENCOUNTER — Ambulatory Visit (INDEPENDENT_AMBULATORY_CARE_PROVIDER_SITE_OTHER): Payer: Medicare Other | Admitting: Podiatry

## 2021-10-24 ENCOUNTER — Encounter: Payer: Self-pay | Admitting: Podiatry

## 2021-10-24 DIAGNOSIS — M79676 Pain in unspecified toe(s): Secondary | ICD-10-CM

## 2021-10-24 DIAGNOSIS — I739 Peripheral vascular disease, unspecified: Secondary | ICD-10-CM | POA: Diagnosis not present

## 2021-10-24 DIAGNOSIS — M2030 Hallux varus (acquired), unspecified foot: Secondary | ICD-10-CM

## 2021-10-24 DIAGNOSIS — B351 Tinea unguium: Secondary | ICD-10-CM | POA: Diagnosis not present

## 2021-10-24 NOTE — Progress Notes (Signed)
Complaint:  Visit Type: Patient returns to my office for continued preventative foot care services. Complaint: Patient states" my nails have grown long and thick and become painful to walk and wear shoes"  The patient presents for preventative foot care services. No changes to ROS.  Patient was diagnosed with an ulcer right heel.  He was treated and is followed by another podiatrist at another facility.  He requests only nail care be provided today.  Podiatric Exam: Vascular: dorsalis pedis and posterior tibial pulses are palpable bilateral. Capillary return is immediate. Temperature gradient is WNL. Skin turgor WNL  Sensorium: Normal Semmes Weinstein monofilament test. Normal tactile sensation bilaterally. Nail Exam: Pt has thick disfigured discolored nails with subungual debris noted bilateral entire nail hallux through fifth toenails Ulcer Exam: Heel ulcer healing right foot. Orthopedic Exam: Muscle tone and strength are WNL. No limitations in general ROM. No crepitus or effusions noted. Foot type and digits show no abnormalities. Bony prominences are unremarkable. Skin: No Porokeratosis. No infection or ulcers.  Callus noted hallux malleus right hallux.   Diagnosis:  Onychomycosis, , Pain in right toe, pain in left toes    Treatment & Plan Procedures and Treatment: Consent by patient was obtained for treatment procedures. The patient understood the discussion of treatment and procedures well. All questions were answered thoroughly reviewed. Debridement of mycotic and hypertrophic toenails, 1 through 5 bilateral and clearing of subungual debris. No ulceration, no infection noted. Return Visit-Office Procedure: Patient instructed to return to the office for a follow up visit 3 months for continued evaluation and treatment.    Giamarie Bueche DPM 

## 2021-11-22 ENCOUNTER — Other Ambulatory Visit: Payer: Self-pay | Admitting: Urology

## 2021-11-22 DIAGNOSIS — N138 Other obstructive and reflux uropathy: Secondary | ICD-10-CM

## 2022-01-30 ENCOUNTER — Encounter: Payer: Self-pay | Admitting: Podiatry

## 2022-01-30 ENCOUNTER — Ambulatory Visit (INDEPENDENT_AMBULATORY_CARE_PROVIDER_SITE_OTHER): Payer: Medicare Other | Admitting: Podiatry

## 2022-01-30 DIAGNOSIS — I739 Peripheral vascular disease, unspecified: Secondary | ICD-10-CM

## 2022-01-30 DIAGNOSIS — M79676 Pain in unspecified toe(s): Secondary | ICD-10-CM

## 2022-01-30 DIAGNOSIS — M2030 Hallux varus (acquired), unspecified foot: Secondary | ICD-10-CM

## 2022-01-30 DIAGNOSIS — B351 Tinea unguium: Secondary | ICD-10-CM | POA: Diagnosis not present

## 2022-01-30 NOTE — Progress Notes (Signed)
Complaint:  Visit Type: Patient returns to my office for continued preventative foot care services. Complaint: Patient states" my nails have grown long and thick and become painful to walk and wear shoes"  The patient presents for preventative foot care services. No changes to ROS.  Patient was diagnosed with an ulcer right heel.  He was treated and is followed by another podiatrist at another facility.  He requests only nail care be provided today.  Podiatric Exam: Vascular: dorsalis pedis and posterior tibial pulses are palpable bilateral. Capillary return is immediate. Temperature gradient is WNL. Skin turgor WNL  Sensorium: Normal Semmes Weinstein monofilament test. Normal tactile sensation bilaterally. Nail Exam: Pt has thick disfigured discolored nails with subungual debris noted bilateral entire nail hallux through fifth toenails Ulcer Exam: Heel ulcer healing right foot. Orthopedic Exam: Muscle tone and strength are WNL. No limitations in general ROM. No crepitus or effusions noted. Foot type and digits show no abnormalities. Bony prominences are unremarkable. Skin: No Porokeratosis. No infection or ulcers.  Callus noted hallux malleus right hallux.   Diagnosis:  Onychomycosis, , Pain in right toe, pain in left toes    Treatment & Plan Procedures and Treatment: Consent by patient was obtained for treatment procedures. The patient understood the discussion of treatment and procedures well. All questions were answered thoroughly reviewed. Debridement of mycotic and hypertrophic toenails, 1 through 5 bilateral and clearing of subungual debris. No ulceration, no infection noted. Return Visit-Office Procedure: Patient instructed to return to the office for a follow up visit 3 months for continued evaluation and treatment.    Athony Coppa DPM 

## 2022-04-17 ENCOUNTER — Other Ambulatory Visit: Payer: Self-pay | Admitting: *Deleted

## 2022-04-17 DIAGNOSIS — N401 Enlarged prostate with lower urinary tract symptoms: Secondary | ICD-10-CM

## 2022-04-17 MED ORDER — FINASTERIDE 5 MG PO TABS: 5.0000 mg | ORAL_TABLET | Freq: Every day | ORAL | 3 refills | Status: DC

## 2022-04-17 MED ORDER — TAMSULOSIN HCL 0.4 MG PO CAPS: 0.4000 mg | ORAL_CAPSULE | Freq: Every day | ORAL | 3 refills | Status: DC

## 2022-05-02 ENCOUNTER — Ambulatory Visit (INDEPENDENT_AMBULATORY_CARE_PROVIDER_SITE_OTHER): Payer: Medicare Other

## 2022-05-02 VITALS — Ht 74.0 in | Wt 260.0 lb

## 2022-05-02 DIAGNOSIS — Z0000 Encounter for general adult medical examination without abnormal findings: Secondary | ICD-10-CM

## 2022-05-02 NOTE — Progress Notes (Signed)
Subjective:   Terry Vaughn is a 88 y.o. male who presents for Medicare Annual/Subsequent preventive examination.  Review of Systems    No ROS.  Medicare Wellness Virtual Visit.  Visual/audio telehealth visit, UTA vital signs.   See social history for additional risk factors.   Cardiac Risk Factors include: advanced age (>2men, >60 women);male gender     Objective:    Today's Vitals   05/02/22 1302  Weight: 260 lb (117.9 kg)  Height: 6\' 2"  (1.88 m)   Body mass index is 33.38 kg/m.     05/02/2022    1:06 PM 04/24/2021   11:25 AM 04/23/2020   11:13 AM 12/09/2019   11:09 AM 12/08/2019    2:19 AM 12/07/2019    2:12 PM 04/21/2019   11:13 AM  Advanced Directives  Does Patient Have a Medical Advance Directive? Yes Yes Yes Yes Yes No Yes  Type of 04/23/2019 of [place];Living will Healthcare Power of [place];Living will Healthcare Power of [place];Living will Healthcare Power of [place];Living will Healthcare Power of [place];Living will  Healthcare Power of [place];Living will  Does patient want to make changes to medical advance directive? No - Patient declined No - Patient declined No - Patient declined No - Patient declined No - Patient declined  No - Patient declined  Copy of Healthcare Power of Attorney in Chart? Yes - validated most recent copy scanned in chart (See row information) Yes - validated most recent copy scanned in chart (See row information) Yes - validated most recent copy scanned in chart (See row information) No - copy requested Yes - validated most recent copy scanned in chart (See row information)  Yes - validated most recent copy scanned in chart (See row information)  Would patient like information on creating a medical advance directive?     No - Patient declined No - Patient declined     Current Medications (verified) Outpatient Encounter Medications as of 05/02/2022  Medication Sig   aspirin 81 MG chewable tablet Chew 81 mg by  mouth daily.   docusate sodium (COLACE) 100 MG capsule Take 100 mg by mouth daily as needed for mild constipation.   finasteride (PROSCAR) 5 MG tablet Take 1 tablet (5 mg total) by mouth daily.   losartan (COZAAR) 25 MG tablet    tamsulosin (FLOMAX) 0.4 MG CAPS capsule Take 1 capsule (0.4 mg total) by mouth daily.   No facility-administered encounter medications on file as of 05/02/2022.    Allergies (verified) Codeine   History: Past Medical History:  Diagnosis Date   Bone spur 1988   left heel   Bursitis 1987   right shoulder   Cellulitis 1986   right leg   Pneumonia    noted 07/17/17 CXR    Shingles 1959   Urinary retention    UTI (urinary tract infection)    sepsis E coli UTI +urine and blood cultures 07/17/17    Past Surgical History:  Procedure Laterality Date   APPENDECTOMY  1942   CHOLECYSTECTOMY  1966   EYE SURGERY  2015   catarcts extracted   IRRIGATION AND DEBRIDEMENT FOOT Right 12/09/2019   Procedure: IRRIGATION AND DEBRIDEMENT HEEL;  Surgeon: 12/11/2019, DPM;  Location: [facility] ORS;  Service: Podiatry;  Laterality: Right;   JOINT REPLACEMENT     right hip 20 + years from 2019, left hip replaced 05/01/17    MELANOMA EXCISION  04.2010   mole removal right arm   TONSILLECTOMY AND ADENOIDECTOMY  1944   TOTAL HIP ARTHROPLASTY  1999   right hip   Family History  Problem Relation Age of Onset   Clotting disorder Mother    Heart disease Father 52   Cancer Sister        breast   Cerebral aneurysm Brother        [place]   Cancer Daughter        breast   Social History   Socioeconomic History   Marital status: Married    Spouse name: Garlan Fair   Number of children: 3   Years of education: 16   Highest education level: Some college, no degree  Occupational History   Not on file  Tobacco Use   Smoking status: Never   Smokeless tobacco: Never  Vaping Use   Vaping Use: Never used  Substance and Sexual Activity   Alcohol use: No    Alcohol/week: 0.0  standard drinks of alcohol   Drug use: No   Sexual activity: Not Currently  Other Topics Concern   Not on file  Social History Narrative   Lives in [place] with wife.   Diet - regular   Exercise - none   Social Determinants of Health   Financial Resource Strain: Low Risk  (05/02/2022)   Overall Financial Resource Strain (CARDIA)    Difficulty of Paying Living Expenses: Not hard at all  Food Insecurity: No Food Insecurity (05/02/2022)   Hunger Vital Sign    Worried About Running Out of Food in the Last Year: Never true    Ran Out of Food in the Last Year: Never true  Transportation Needs: No Transportation Needs (05/02/2022)   PRAPARE - Administrator, Civil Service (Medical): No    Lack of Transportation (Non-Medical): No  Physical Activity: Unknown (04/23/2020)   Exercise Vital Sign    Days of Exercise per Week: 0 days    Minutes of Exercise per Session: Not on file  Stress: No Stress Concern Present (05/02/2022)   [employer] of Occupational Health - Occupational Stress Questionnaire    Feeling of Stress : Not at all  Social Connections: Unknown (05/02/2022)   Social Connection and Isolation Panel [NHANES]    Frequency of Communication with Friends and Family: More than three times a week    Frequency of Social Gatherings with Friends and Family: Not on file    Attends Religious Services: Not on file    Active Member of Clubs or Organizations: Not on file    Attends Banker Meetings: Not on file    Marital Status: Married    Tobacco Counseling Counseling given: Not Answered   Clinical Intake:  Pre-visit preparation completed: Yes        Diabetes: No  How often do you need to have someone help you when you read instructions, pamphlets, or other written materials from your doctor or pharmacy?: 1 - Never  Interpreter Needed?: No      Activities of Daily Living    05/02/2022    1:07 PM  In your present state of health, do you  have any difficulty performing the following activities:  Hearing? 0  Vision? 0  Difficulty concentrating or making decisions? 0  Walking or climbing stairs? 1  Comment Walker in use when ambulating  Dressing or bathing? 0  Doing errands, shopping? 1  Comment Son Ship broker and eating ? N  Comment Microwave meals  Using the Toilet? N  In the past six months,  have you accidently leaked urine? N  Do you have problems with loss of bowel control? N  Managing your Medications? N  Managing your Finances? N  Housekeeping or managing your Housekeeping? N    Patient Care Team: Burnard Hawthorne, FNP as PCP - General (Family Medicine)  Indicate any recent Medical Services you may have received from other than [facility] providers in the past year (date may be approximate).     Assessment:   This is a routine wellness examination for [place].  I connected with  Merry Proud on 05/02/22 by a audio enabled telemedicine application and verified that I am speaking with the correct person using two identifiers.  Patient Location: Home  Provider Location: Office/Clinic  I discussed the limitations of evaluation and management by telemedicine. The patient expressed understanding and agreed to proceed.   Hearing/Vision screen Hearing Screening - Comments:: Patient is able to hear conversational tones without difficulty. No issues reported. Vision Screening - Comments:: Followed by [facility] (Dr. Wallace Going)  Wears corrective lenses when reading Cataract extraction, bilateral  They have regular follow up with the ophthalmologist  Dietary issues and exercise activities discussed: Current Exercise Habits: Home exercise routine, Intensity: Mild Regular diet Good water intake   Goals Addressed               This Visit's Progress     Patient Stated     Increase physical activity (pt-stated)   On track     I should do more chair exercises  I should drink more  water       Depression Screen    05/02/2022    1:05 PM 04/24/2021   11:29 AM 04/23/2020   11:14 AM 01/24/2020    8:30 AM 04/21/2019   11:20 AM 11/30/2017    1:23 PM 11/27/2016    1:27 PM  PHQ 2/9 Scores  PHQ - 2 Score 0 0 0 0 0 0 1  PHQ- 9 Score       2    Fall Risk    05/02/2022    1:06 PM 04/24/2021   11:27 AM 04/23/2020   11:14 AM 01/24/2020    8:30 AM 04/21/2019   11:20 AM  [place] in the past year? 0 0 0 0 0  Number falls in past yr: 0  0 0   Injury with Fall? 0   0   Risk for fall due to : Impaired balance/gait      Risk for fall due to: Comment walker in use when ambulating      Follow up Falls evaluation completed Falls evaluation completed Falls evaluation completed Falls evaluation completed     [place]: Home free of loose throw rugs in walkways, pet beds, electrical cords, etc? Yes  Adequate lighting in your home to reduce risk of falls? Yes   ASSISTIVE DEVICES UTILIZED TO PREVENT FALLS: Life alert? No  Use of a cane, walker or w/c? Yes  Grab bars in the bathroom? Yes  Shower chair or bench in shower? Yes  Elevated toilet seat or a handicapped toilet? Yes  BSC? Available if needed.   TIMED UP AND GO: Was the test performed? No .   Cognitive Function:    11/28/2015    1:32 PM  MMSE - Mini Mental State Exam  Orientation to time 5  Orientation to Place 5  Registration 3  Attention/ Calculation 5  Recall 3  Language-  name 2 objects 2  Language- repeat 1  Language- follow 3 step command 3  Language- read & follow direction 1  Write a sentence 1  Copy design 1  Total score 30        05/02/2022    1:19 PM 04/24/2021   11:32 AM 04/23/2020   11:21 AM 04/21/2019   11:29 AM 11/30/2017    1:51 PM  6CIT Screen  What Year? 0 points 0 points 0 points 0 points 0 points  What month? 0 points 0 points 0 points 0 points 0 points  What time? 0 points 0 points 0 points 0 points 0 points  Count back from 20 0 points 0  points  0 points 0 points  Months in reverse 0 points 0 points  0 points 0 points  Repeat phrase 0 points 0 points  0 points 0 points  Total Score 0 points 0 points  0 points 0 points    Immunizations Immunization History  Administered Date(s) Administered   Hepatitis B 07/06/1992   Influenza Nasal 03/29/2011   Influenza Split 05/02/2012, 05/11/2014   Influenza Whole 05/28/2008   Influenza, High Dose Seasonal PF 04/15/2017, 05/31/2018, 04/28/2019   Influenza-Unspecified 05/06/2016, 06/28/2020, 05/18/2021   PFIZER(Purple Top)SARS-COV-2 Vaccination 08/12/2019, 09/02/2019   Pfizer Covid-19 Vaccine Bivalent Booster 5y-11y 05/18/2021   Pneumococcal Conjugate-13 03/24/2014   Pneumococcal Polysaccharide-23 05/28/2000   Td 05/28/2008   Tdap 12/27/2010   Zoster, Live 05/29/2007    TDAP status: Due, Education has been provided regarding the importance of this vaccine. Advised may receive this vaccine at local pharmacy or Health Dept. Aware to provide a copy of the vaccination record if obtained from local pharmacy or Health Dept. Verbalized acceptance and understanding.  Flu Vaccine status: Due, Education has been provided regarding the importance of this vaccine. Advised may receive this vaccine at local pharmacy or Health Dept. Aware to provide a copy of the vaccination record if obtained from local pharmacy or Health Dept. Verbalized acceptance and understanding.  Covid-19 vaccine status: Completed vaccines x3.  Shingrix Completed?: No.    Education has been provided regarding the importance of this vaccine. Patient has been advised to call insurance company to determine out of pocket expense if they have not yet received this vaccine. Advised may also receive vaccine at local pharmacy or Health Dept. Verbalized acceptance and understanding.  Screening Tests Health Maintenance  Topic Date Due   COVID-19 Vaccine (4 - Pfizer series) 05/18/2022 (Originally 09/18/2021)   Zoster Vaccines-  Shingrix (1 of 2) 06/27/2022 (Originally 10/26/1984)   INFLUENZA VACCINE  10/26/2022 (Originally 02/25/2022)   TETANUS/TDAP  05/03/2023 (Originally 12/26/2020)   Pneumonia Vaccine 6+ Years old  Completed   HPV VACCINES  Aged Out   Health Maintenance There are no preventive care reminders to display for this patient.  Lung Cancer Screening: (Low Dose CT Chest recommended if Age 75-80 years, 30 pack-year currently smoking OR have quit w/in 15years.) does not qualify.   Hepatitis C Screening: does not qualify.  Vision Screening: Recommended annual ophthalmology exams for early detection of glaucoma and other disorders of the eye.  Dental Screening: Recommended annual dental exams for proper oral hygiene  Community Resource Referral / Chronic Care Management: CRR required this visit?  No   CCM required this visit?  No      Plan:     I have personally reviewed and noted the following in the patient's chart:   Medical and social history Use of alcohol, tobacco or  illicit drugs  Current medications and supplements including opioid prescriptions. Patient is not currently taking opioid prescriptions. Functional ability and status Nutritional status Physical activity Advanced directives List of other physicians Hospitalizations, surgeries, and ER visits in previous 12 months Vitals Screenings to include cognitive, depression, and falls Referrals and appointments  In addition, I have reviewed and discussed with patient certain preventive protocols, quality metrics, and best practice recommendations. A written personalized care plan for preventive services as well as general preventive health recommendations were provided to patient.     Ashok PallOBrien-Blaney, Zenaya Ulatowski L, LPN   81/1/914710/12/2021

## 2022-05-02 NOTE — Patient Instructions (Addendum)
Terry Vaughn , Thank you for taking time to come for your Medicare Wellness Visit. I appreciate your ongoing commitment to your health goals. Please review the following plan we discussed and let me know if I can assist you in the future.   These are the goals we discussed:  Goals       Patient Stated     Increase physical activity (pt-stated)      I should do more chair exercises  I should drink more water        This is a list of the screening recommended for you and due dates:  Health Maintenance  Topic Date Due   COVID-19 Vaccine (4 - Pfizer series) 05/18/2022*   Zoster (Shingles) Vaccine (1 of 2) 06/27/2022*   Flu Shot  10/26/2022*   Tetanus Vaccine  05/03/2023*   Pneumonia Vaccine  Completed   HPV Vaccine  Aged Out  *Topic was postponed. The date shown is not the original due date.    Complete Shingles vaccine, Tdap vaccine, Flu vaccine. Update immunization record.   Advanced directives: on file  Conditions/risks identified: none new.  Next appointment: Follow up in one year for your annual wellness visit.   Preventive Care 86 Years and Older, Male  Preventive care refers to lifestyle choices and visits with your health care provider that can promote health and wellness. What does preventive care include? A yearly physical exam. This is also called an annual well check. Dental exams once or twice a year. Routine eye exams. Ask your health care provider how often you should have your eyes checked. Personal lifestyle choices, including: Daily care of your teeth and gums. Regular physical activity. Eating a healthy diet. Avoiding tobacco and drug use. Limiting alcohol use. Practicing safe sex. Taking low doses of aspirin every day. Taking vitamin and mineral supplements as recommended by your health care provider. What happens during an annual well check? The services and screenings done by your health care provider during your annual well check will depend on  your age, overall health, lifestyle risk factors, and family history of disease. Counseling  Your health care provider may ask you questions about your: Alcohol use. Tobacco use. Drug use. Emotional well-being. Home and relationship well-being. Sexual activity. Eating habits. History of falls. Memory and ability to understand (cognition). Work and work Statistician. Screening  You may have the following tests or measurements: Height, weight, and BMI. Blood pressure. Lipid and cholesterol levels. These may be checked every 5 years, or more frequently if you are over 24 years old. Skin check. Lung cancer screening. You may have this screening every year starting at age 37 if you have a 30-pack-year history of smoking and currently smoke or have quit within the past 15 years. Fecal occult blood test (FOBT) of the stool. You may have this test every year starting at age 43. Flexible sigmoidoscopy or colonoscopy. You may have a sigmoidoscopy every 5 years or a colonoscopy every 10 years starting at age 22. Prostate cancer screening. Recommendations will vary depending on your family history and other risks. Hepatitis C blood test. Hepatitis B blood test. Sexually transmitted disease (STD) testing. Diabetes screening. This is done by checking your blood sugar (glucose) after you have not eaten for a while (fasting). You may have this done every 1-3 years. Abdominal aortic aneurysm (AAA) screening. You may need this if you are a current or former smoker. Osteoporosis. You may be screened starting at age 61 if you are at  high risk. Talk with your health care provider about your test results, treatment options, and if necessary, the need for more tests. Vaccines  Your health care provider may recommend certain vaccines, such as: Influenza vaccine. This is recommended every year. Tetanus, diphtheria, and acellular pertussis (Tdap, Td) vaccine. You may need a Td booster every 10 years. Zoster  vaccine. You may need this after age 90. Pneumococcal 13-valent conjugate (PCV13) vaccine. One dose is recommended after age 15. Pneumococcal polysaccharide (PPSV23) vaccine. One dose is recommended after age 3. Talk to your health care provider about which screenings and vaccines you need and how often you need them. This information is not intended to replace advice given to you by your health care provider. Make sure you discuss any questions you have with your health care provider. Document Released: 08/10/2015 Document Revised: 04/02/2016 Document Reviewed: 05/15/2015 Elsevier Interactive Patient Education  2017 [employer] Prevention in the Home Falls can cause injuries. They can happen to people of all ages. There are many things you can do to make your home safe and to help prevent falls. What can I do on the outside of my home? Regularly fix the edges of walkways and driveways and fix any cracks. Remove anything that might make you trip as you walk through a door, such as a raised step or threshold. Trim any bushes or trees on the path to your home. Use bright outdoor lighting. Clear any walking paths of anything that might make someone trip, such as rocks or tools. Regularly check to see if handrails are loose or broken. Make sure that both sides of any steps have handrails. Any raised decks and porches should have guardrails on the edges. Have any leaves, snow, or ice cleared regularly. Use sand or salt on walking paths during winter. Clean up any spills in your garage right away. This includes oil or grease spills. What can I do in the bathroom? Use night lights. Install grab bars by the toilet and in the tub and shower. Do not use towel bars as grab bars. Use non-skid mats or decals in the tub or shower. If you need to sit down in the shower, use a plastic, non-slip stool. Keep the floor dry. Clean up any water that spills on the floor as soon as it happens. Remove  soap buildup in the tub or shower regularly. Attach bath mats securely with double-sided non-slip rug tape. Do not have throw rugs and other things on the floor that can make you trip. What can I do in the bedroom? Use night lights. Make sure that you have a light by your bed that is easy to reach. Do not use any sheets or blankets that are too big for your bed. They should not hang down onto the floor. Have a firm chair that has side arms. You can use this for support while you get dressed. Do not have throw rugs and other things on the floor that can make you trip. What can I do in the kitchen? Clean up any spills right away. Avoid walking on wet floors. Keep items that you use a lot in easy-to-reach places. If you need to reach something above you, use a strong step stool that has a grab bar. Keep electrical cords out of the way. Do not use floor polish or wax that makes floors slippery. If you must use wax, use non-skid floor wax. Do not have throw rugs and other things on the floor that  can make you trip. What can I do with my stairs? Do not leave any items on the stairs. Make sure that there are handrails on both sides of the stairs and use them. Fix handrails that are broken or loose. Make sure that handrails are as long as the stairways. Check any carpeting to make sure that it is firmly attached to the stairs. Fix any carpet that is loose or worn. Avoid having throw rugs at the top or bottom of the stairs. If you do have throw rugs, attach them to the floor with carpet tape. Make sure that you have a light switch at the top of the stairs and the bottom of the stairs. If you do not have them, ask someone to add them for you. What else can I do to help prevent falls? Wear shoes that: Do not have high heels. Have rubber bottoms. Are comfortable and fit you well. Are closed at the toe. Do not wear sandals. If you use a stepladder: Make sure that it is fully opened. Do not climb a  closed stepladder. Make sure that both sides of the stepladder are locked into place. Ask someone to hold it for you, if possible. Clearly mark and make sure that you can see: Any grab bars or handrails. First and last steps. Where the edge of each step is. Use tools that help you move around (mobility aids) if they are needed. These include: Canes. Walkers. Scooters. Crutches. Turn on the lights when you go into a dark area. Replace any light bulbs as soon as they burn out. Set up your furniture so you have a clear path. Avoid moving your furniture around. If any of your floors are uneven, fix them. If there are any pets around you, be aware of where they are. Review your medicines with your doctor. Some medicines can make you feel dizzy. This can increase your chance of falling. Ask your doctor what other things that you can do to help prevent falls. This information is not intended to replace advice given to you by your health care provider. Make sure you discuss any questions you have with your health care provider. Document Released: 05/10/2009 Document Revised: 12/20/2015 Document Reviewed: 08/18/2014 Elsevier Interactive Patient Education  2017 [employer].

## 2022-05-15 ENCOUNTER — Encounter: Payer: Self-pay | Admitting: Podiatry

## 2022-05-15 ENCOUNTER — Ambulatory Visit (INDEPENDENT_AMBULATORY_CARE_PROVIDER_SITE_OTHER): Payer: Medicare Other | Admitting: Podiatry

## 2022-05-15 DIAGNOSIS — B351 Tinea unguium: Secondary | ICD-10-CM | POA: Diagnosis not present

## 2022-05-15 DIAGNOSIS — I739 Peripheral vascular disease, unspecified: Secondary | ICD-10-CM

## 2022-05-15 DIAGNOSIS — M2030 Hallux varus (acquired), unspecified foot: Secondary | ICD-10-CM

## 2022-05-15 DIAGNOSIS — M79676 Pain in unspecified toe(s): Secondary | ICD-10-CM

## 2022-05-15 NOTE — Progress Notes (Signed)
Complaint:  Visit Type: Patient returns to my office for continued preventative foot care services. Complaint: Patient states" my nails have grown long and thick and become painful to walk and wear shoes"  The patient presents for preventative foot care services. No changes to ROS.  Patient was diagnosed with an ulcer right heel.  He was treated and is followed by another podiatrist at another facility.  He requests only nail care be provided today.  Podiatric Exam: Vascular: dorsalis pedis and posterior tibial pulses are palpable bilateral. Capillary return is immediate. Temperature gradient is WNL. Skin turgor WNL  Sensorium: Normal Semmes Weinstein monofilament test. Normal tactile sensation bilaterally. Nail Exam: Pt has thick disfigured discolored nails with subungual debris noted bilateral entire nail hallux through fifth toenails Ulcer Exam: Heel ulcer healing right foot. Orthopedic Exam: Muscle tone and strength are WNL. No limitations in general ROM. No crepitus or effusions noted. Foot type and digits show no abnormalities. Bony prominences are unremarkable. Skin: No Porokeratosis. No infection or ulcers.  Callus noted hallux malleus right hallux.   Diagnosis:  Onychomycosis, , Pain in right toe, pain in left toes    Treatment & Plan Procedures and Treatment: Consent by patient was obtained for treatment procedures. The patient understood the discussion of treatment and procedures well. All questions were answered thoroughly reviewed. Debridement of mycotic and hypertrophic toenails, 1 through 5 bilateral and clearing of subungual debris. No ulceration, no infection noted. Return Visit-Office Procedure: Patient instructed to return to the office for a follow up visit 3 months for continued evaluation and treatment.    Gardiner Barefoot DPM

## 2022-05-23 ENCOUNTER — Encounter: Payer: Self-pay | Admitting: Family

## 2022-05-23 ENCOUNTER — Ambulatory Visit (INDEPENDENT_AMBULATORY_CARE_PROVIDER_SITE_OTHER): Payer: Medicare Other | Admitting: Family

## 2022-05-23 VITALS — BP 136/78 | HR 86 | Temp 97.4°F | Ht 74.0 in | Wt 267.8 lb

## 2022-05-23 DIAGNOSIS — Z23 Encounter for immunization: Secondary | ICD-10-CM | POA: Diagnosis not present

## 2022-05-23 DIAGNOSIS — Z136 Encounter for screening for cardiovascular disorders: Secondary | ICD-10-CM

## 2022-05-23 DIAGNOSIS — I10 Essential (primary) hypertension: Secondary | ICD-10-CM | POA: Diagnosis not present

## 2022-05-23 DIAGNOSIS — Z0000 Encounter for general adult medical examination without abnormal findings: Secondary | ICD-10-CM

## 2022-05-23 NOTE — Progress Notes (Signed)
Subjective:    Patient ID: Terry Vaughn, male    DOB: February 16, 1933, 88 y.o.   MRN: [id number]  CC: Terry Vaughn is a 88 y.o. male who presents today for physical exam.    HPI: Accompanied by son today  Feels well today No new complaints    HTN-compliant with losartan 25 mg. No cp.    Family history of heart disease. He is compliant with daily aspirin 81 mg. He is not sure why he started a year ago. Bleeding on arms. No falls. Using a walker. No h/o CVA, CAD, cardiac stent.   Chronic kidney disease, continues to follow with nephrology, Dr. Holley Raring Colorectal  Cancer Screening: He is no longer screening for colon cancer.  Last colonoscopy 2013 Prostate Cancer Screening: we reviewed the AUA guidelines that do not recommend routine screening in men over age 59.    Immunizations       Tetanus - due        Pneumococcal - Completed  Labs: Screening labs today. Exercise: No regular exercise.   Alcohol use:  none Smoking/tobacco use: Nonsmoker.     HISTORY:  Past Medical History:  Diagnosis Date   Bone spur 1988   left heel   Bursitis 1987   right shoulder   Cellulitis 1986   right leg   Pneumonia    noted 07/17/17 CXR    Shingles 1959   Urinary retention    UTI (urinary tract infection)    sepsis E coli UTI +urine and blood cultures 07/17/17     Past Surgical History:  Procedure Laterality Date   APPENDECTOMY  1942   CHOLECYSTECTOMY  1966   EYE SURGERY  2015   catarcts extracted   IRRIGATION AND DEBRIDEMENT FOOT Right 12/09/2019   Procedure: IRRIGATION AND DEBRIDEMENT HEEL;  Surgeon: Caroline More, DPM;  Location: [facility] ORS;  Service: Podiatry;  Laterality: Right;   JOINT REPLACEMENT     right hip 20 + years from 2019, left hip replaced 05/01/17    MELANOMA EXCISION  04.2010   mole removal right arm   TONSILLECTOMY AND ADENOIDECTOMY  1944   TOTAL HIP ARTHROPLASTY  1999   right hip   Family History  Problem Relation Age of Onset   Clotting disorder Mother     Heart disease Father 37   Cancer Sister        breast   Cerebral aneurysm Brother        hemmorage   Cancer Daughter        breast      ALLERGIES: Codeine  Current Outpatient Medications on File Prior to Visit  Medication Sig Dispense Refill   docusate sodium (COLACE) 100 MG capsule Take 100 mg by mouth daily as needed for mild constipation.     finasteride (PROSCAR) 5 MG tablet Take 1 tablet (5 mg total) by mouth daily. 90 tablet 3   losartan (COZAAR) 25 MG tablet      tamsulosin (FLOMAX) 0.4 MG CAPS capsule Take 1 capsule (0.4 mg total) by mouth daily. 90 capsule 3   No current facility-administered medications on file prior to visit.    Social History   Tobacco Use   Smoking status: Never   Smokeless tobacco: Never  Vaping Use   Vaping Use: Never used  Substance Use Topics   Alcohol use: No    Alcohol/week: 0.0 standard drinks of alcohol   Drug use: No    Review of Systems  Constitutional:  Negative for  chills and fever.  Respiratory:  Negative for cough.   Cardiovascular:  Negative for chest pain and palpitations.  Gastrointestinal:  Negative for nausea and vomiting.      Objective:    BP 136/78 (BP Location: Left Arm, Patient Position: Sitting, Cuff Size: Normal)   Pulse 86   Temp (!) 97.4 F (36.3 C) (Oral)   Ht 6\' 2"  (1.88 m)   Wt 267 lb 12.8 oz (121.5 kg)   SpO2 96%   BMI 34.38 kg/m   BP Readings from Last 3 Encounters:  05/23/22 136/78  06/26/21 (!) 152/69  06/25/21 (!) 159/88   Wt Readings from Last 3 Encounters:  05/23/22 267 lb 12.8 oz (121.5 kg)  05/02/22 260 lb (117.9 kg)  06/26/21 260 lb (117.9 kg)    Physical Exam Vitals reviewed.  Constitutional:      Appearance: He is well-developed.  Cardiovascular:     Rate and Rhythm: Regular rhythm.     Heart sounds: Normal heart sounds.  Pulmonary:     Effort: Pulmonary effort is normal. No respiratory distress.     Breath sounds: Normal breath sounds. No wheezing, rhonchi or rales.   Skin:    General: Skin is warm and dry.  Neurological:     Mental Status: He is alert.  Psychiatric:        Speech: Speech normal.        Behavior: Behavior normal.        Assessment & Plan:   Problem List Items Addressed This Visit       Cardiovascular and Mediastinum   Hypertension    Excellent control. Continue losartan 25mg       Relevant Orders   Comprehensive metabolic panel     Other   Routine physical examination - Primary    Patient longer screening for colon cancer.  Note: discussed ASA use at length today. Age 26 and bleeding , bruising on arms.  Discussed with patient and son my concern for bleeding including GI bleed outweighs cardiovascular benefits in setting of primary prevention.  Advised reasonable to stay off of ASA. We will continue to discuss at future visits.        Other Visit Diagnoses     Screening for cardiovascular condition       Relevant Orders   Lipid panel        I have discontinued E. Soliman's aspirin. I am also having him maintain his docusate sodium, losartan, finasteride, and tamsulosin.   No orders of the defined types were placed in this encounter.   Return precautions given.   Risks, benefits, and alternatives of the medications and treatment plan prescribed today were discussed, and patient expressed understanding.   Education regarding symptom management and diagnosis given to patient on AVS.   Continue to follow with 97, FNP for routine health maintenance.   Homero Fellers and I agreed with plan.   Allegra Grana, FNP

## 2022-05-23 NOTE — Assessment & Plan Note (Signed)
Patient longer screening for colon cancer.  Note: discussed ASA use at length today. Age 88 and bleeding , bruising on arms.  Discussed with patient and son my concern for bleeding including GI bleed outweighs cardiovascular benefits in setting of primary prevention.  Advised reasonable to stay off of ASA. We will continue to discuss at future visits.

## 2022-05-23 NOTE — Patient Instructions (Signed)
Nice to see you Health Maintenance, Male Adopting a healthy lifestyle and getting preventive care are important in promoting health and wellness. Ask your health care provider about: The right schedule for you to have regular tests and exams. Things you can do on your own to prevent diseases and keep yourself healthy. What should I know about diet, weight, and exercise? Eat a healthy diet  Eat a diet that includes plenty of vegetables, fruits, low-fat dairy products, and lean protein. Do not eat a lot of foods that are high in solid fats, added sugars, or sodium. Maintain a healthy weight Body mass index (BMI) is a measurement that can be used to identify possible weight problems. It estimates body fat based on height and weight. Your health care provider can help determine your BMI and help you achieve or maintain a healthy weight. Get regular exercise Get regular exercise. This is one of the most important things you can do for your health. Most adults should: Exercise for at least 150 minutes each week. The exercise should increase your heart rate and make you sweat (moderate-intensity exercise). Do strengthening exercises at least twice a week. This is in addition to the moderate-intensity exercise. Spend less time sitting. Even light physical activity can be beneficial. Watch cholesterol and blood lipids Have your blood tested for lipids and cholesterol at 86 years of age, then have this test every 5 years. You may need to have your cholesterol levels checked more often if: Your lipid or cholesterol levels are high. You are older than 86 years of age. You are at high risk for heart disease. What should I know about cancer screening? Many types of cancers can be detected early and may often be prevented. Depending on your health history and family history, you may need to have cancer screening at various ages. This may include screening for: Colorectal cancer. Prostate cancer. Skin  cancer. Lung cancer. What should I know about heart disease, diabetes, and high blood pressure? Blood pressure and heart disease High blood pressure causes heart disease and increases the risk of stroke. This is more likely to develop in people who have high blood pressure readings or are overweight. Talk with your health care provider about your target blood pressure readings. Have your blood pressure checked: Every 3-5 years if you are 18-39 years of age. Every year if you are 40 years old or older. If you are between the ages of 65 and 75 and are a current or former smoker, ask your health care provider if you should have a one-time screening for abdominal aortic aneurysm (AAA). Diabetes Have regular diabetes screenings. This checks your fasting blood sugar level. Have the screening done: Once every three years after age 45 if you are at a normal weight and have a low risk for diabetes. More often and at a younger age if you are overweight or have a high risk for diabetes. What should I know about preventing infection? Hepatitis B If you have a higher risk for hepatitis B, you should be screened for this virus. Talk with your health care provider to find out if you are at risk for hepatitis B infection. Hepatitis C Blood testing is recommended for: Everyone born from 1945 through 1965. Anyone with known risk factors for hepatitis C. Sexually transmitted infections (STIs) You should be screened each year for STIs, including gonorrhea and chlamydia, if: You are sexually active and are younger than 86 years of age. You are older than 86 years   of age and your health care provider tells you that you are at risk for this type of infection. Your sexual activity has changed since you were last screened, and you are at increased risk for chlamydia or gonorrhea. Ask your health care provider if you are at risk. Ask your health care provider about whether you are at high risk for HIV. Your health  care provider may recommend a prescription medicine to help prevent HIV infection. If you choose to take medicine to prevent HIV, you should first get tested for HIV. You should then be tested every 3 months for as long as you are taking the medicine. Follow these instructions at home: Alcohol use Do not drink alcohol if your health care provider tells you not to drink. If you drink alcohol: Limit how much you have to 0-2 drinks a day. Know how much alcohol is in your drink. In the [place] one drink equals one 12 oz bottle of beer (355 mL), one 5 oz glass of wine (148 mL), or one 1 oz glass of hard liquor (44 mL). Lifestyle Do not use any products that contain nicotine or tobacco. These products include cigarettes, chewing tobacco, and vaping devices, such as e-cigarettes. If you need help quitting, ask your health care provider. Do not use street drugs. Do not share needles. Ask your health care provider for help if you need support or information about quitting drugs. General instructions Schedule regular health, dental, and eye exams. Stay current with your vaccines. Tell your health care provider if: You often feel depressed. You have ever been abused or do not feel safe at home. Summary Adopting a healthy lifestyle and getting preventive care are important in promoting health and wellness. Follow your health care provider's instructions about healthy diet, exercising, and getting tested or screened for diseases. Follow your health care provider's instructions on monitoring your cholesterol and blood pressure. This information is not intended to replace advice given to you by your health care provider. Make sure you discuss any questions you have with your health care provider. Document Revised: 12/03/2020 Document Reviewed: 12/03/2020 Elsevier Patient Education  2023 [employer]  

## 2022-05-23 NOTE — Assessment & Plan Note (Signed)
Excellent control. Continue losartan 25mg 

## 2022-05-24 LAB — COMPREHENSIVE METABOLIC PANEL
AG Ratio: 1.9 (calc) (ref 1.0–2.5)
ALT: 25 U/L (ref 9–46)
AST: 24 U/L (ref 10–35)
Albumin: 4.5 g/dL (ref 3.6–5.1)
Alkaline phosphatase (APISO): 53 U/L (ref 35–144)
BUN/Creatinine Ratio: 15 (calc) (ref 6–22)
BUN: 21 mg/dL (ref 7–25)
CO2: 24 mmol/L (ref 20–32)
Calcium: 9.2 mg/dL (ref 8.6–10.3)
Chloride: 103 mmol/L (ref 98–110)
Creat: 1.44 mg/dL — ABNORMAL HIGH (ref 0.70–1.22)
Globulin: 2.4 g/dL (calc) (ref 1.9–3.7)
Glucose, Bld: 91 mg/dL (ref 65–99)
Potassium: 4.7 mmol/L (ref 3.5–5.3)
Sodium: 138 mmol/L (ref 135–146)
Total Bilirubin: 1.7 mg/dL — ABNORMAL HIGH (ref 0.2–1.2)
Total Protein: 6.9 g/dL (ref 6.1–8.1)

## 2022-05-24 LAB — LIPID PANEL
Cholesterol: 149 mg/dL (ref <200)
HDL: 49 mg/dL (ref >=40)
LDL Cholesterol (Calc): 79 mg/dL (calc)
Non-HDL Cholesterol (Calc): 100 mg/dL (calc) (ref <130)
Total CHOL/HDL Ratio: 3 (calc) (ref <5.0)
Triglycerides: 111 mg/dL (ref <150)

## 2022-05-28 ENCOUNTER — Encounter: Payer: Self-pay | Admitting: Family

## 2022-06-23 ENCOUNTER — Other Ambulatory Visit (INDEPENDENT_AMBULATORY_CARE_PROVIDER_SITE_OTHER): Payer: Self-pay | Admitting: Vascular Surgery

## 2022-06-23 DIAGNOSIS — L97519 Non-pressure chronic ulcer of other part of right foot with unspecified severity: Secondary | ICD-10-CM

## 2022-06-24 ENCOUNTER — Ambulatory Visit (INDEPENDENT_AMBULATORY_CARE_PROVIDER_SITE_OTHER): Payer: Medicare Other | Admitting: Vascular Surgery

## 2022-06-24 ENCOUNTER — Encounter (INDEPENDENT_AMBULATORY_CARE_PROVIDER_SITE_OTHER): Payer: Self-pay | Admitting: Vascular Surgery

## 2022-06-24 ENCOUNTER — Ambulatory Visit (INDEPENDENT_AMBULATORY_CARE_PROVIDER_SITE_OTHER): Payer: Medicare Other

## 2022-06-24 VITALS — BP 143/78 | HR 67 | Resp 18 | Ht 72.0 in | Wt 265.4 lb

## 2022-06-24 DIAGNOSIS — L97519 Non-pressure chronic ulcer of other part of right foot with unspecified severity: Secondary | ICD-10-CM | POA: Diagnosis not present

## 2022-06-24 DIAGNOSIS — M7989 Other specified soft tissue disorders: Secondary | ICD-10-CM | POA: Diagnosis not present

## 2022-06-24 DIAGNOSIS — I10 Essential (primary) hypertension: Secondary | ICD-10-CM | POA: Diagnosis not present

## 2022-06-24 NOTE — Progress Notes (Signed)
MRN : [id number]  Terry Vaughn is a 88 y.o. ([birth year]) male who presents with chief complaint of No chief complaint on file. Marland Kitchen  History of Present Illness: Patient returns today in follow up of his PAD.  He has a persistent wound on his right foot but his son says they have told him this will likely never heal due to the pressure and configuration in the area.  It is not gotten any worse.  No signs of infection.  No drainage or erythema.  His ABIs are noncompressible but his waveforms are good and his digital pressures remain normal.  Current Outpatient Medications  Medication Sig Dispense Refill   docusate sodium (COLACE) 100 MG capsule Take 100 mg by mouth daily as needed for mild constipation.     finasteride (PROSCAR) 5 MG tablet Take 1 tablet (5 mg total) by mouth daily. 90 tablet 3   losartan (COZAAR) 25 MG tablet      tamsulosin (FLOMAX) 0.4 MG CAPS capsule Take 1 capsule (0.4 mg total) by mouth daily. 90 capsule 3   No current facility-administered medications for this visit.    Past Medical History:  Diagnosis Date   Bone spur 1988   left heel   Bursitis 1987   right shoulder   Cellulitis 1986   right leg   Pneumonia    noted 07/17/17 CXR    Shingles 1959   Urinary retention    UTI (urinary tract infection)    sepsis E coli UTI +urine and blood cultures 07/17/17     Past Surgical History:  Procedure Laterality Date   APPENDECTOMY  1942   CHOLECYSTECTOMY  1966   EYE SURGERY  2015   catarcts extracted   IRRIGATION AND DEBRIDEMENT FOOT Right 12/09/2019   Procedure: IRRIGATION AND DEBRIDEMENT HEEL;  Surgeon: Rosetta Posner, DPM;  Location: [facility] ORS;  Service: Podiatry;  Laterality: Right;   JOINT REPLACEMENT     right hip 20 + years from 2019, left hip replaced 05/01/17    MELANOMA EXCISION  04.2010   mole removal right arm   TONSILLECTOMY AND ADENOIDECTOMY  1944   TOTAL HIP ARTHROPLASTY  1999   right hip     Social History   Tobacco Use   Smoking  status: Never   Smokeless tobacco: Never  Vaping Use   Vaping Use: Never used  Substance Use Topics   Alcohol use: No    Alcohol/week: 0.0 standard drinks of alcohol   Drug use: No       Family History  Problem Relation Age of Onset   Clotting disorder Mother    Heart disease Father 81   Cancer Sister        breast   Cerebral aneurysm Brother        hemmorage   Cancer Daughter        breast     Allergies  Allergen Reactions   Codeine Other (See Comments)    Alters mental state   REVIEW OF SYSTEMS (Negative unless checked)   Constitutional: [] Weight loss  [] Fever  [] Chills Cardiac: [] Chest pain   [] Chest pressure   [] Palpitations   [] Shortness of breath when laying flat   [] Shortness of breath at rest   [] Shortness of breath with exertion. Vascular:  [] Pain in legs with walking   [] Pain in legs at rest   [] Pain in legs when laying flat   [] Claudication   [] Pain in feet when walking  [] Pain in feet at rest  []   Pain in feet when laying flat   [] History of DVT   [] Phlebitis   [x] Swelling in legs   [x] Varicose veins   [x] Non-healing ulcers Pulmonary:   [] Uses home oxygen   [] Productive cough   [] Hemoptysis   [] Wheeze  [] COPD   [] Asthma Neurologic:  [] Dizziness  [] Blackouts   [] Seizures   [] History of stroke   [] History of TIA  [] Aphasia   [] Temporary blindness   [] Dysphagia   [] Weakness or numbness in arms   [] Weakness or numbness in legs Musculoskeletal:  [x] Arthritis   [] Joint swelling   [] Joint pain   [] Low back pain Hematologic:  [] Easy bruising  [] Easy bleeding   [] Hypercoagulable state   [] Anemic   Gastrointestinal:  [] Blood in stool   [] Vomiting blood  [] Gastroesophageal reflux/heartburn   [] Abdominal pain Genitourinary:  [] Chronic kidney disease   [] Difficult urination  [] Frequent urination  [] Burning with urination   [] Hematuria Skin:  [] Rashes   [x] Ulcers   [x] Wounds Psychological:  [] History of anxiety   []  History of major depression.   Physical Examination  BP  (!) 143/78 (BP Location: Left Arm)   Pulse 67   Resp 18   Ht 6' (1.829 m)   Wt 265 lb 6.4 oz (120.4 kg)   BMI 35.99 kg/m  Gen:  WD/WN, NAD.  Appears younger than stated age Head: [place]/AT, No temporalis wasting. Ear/Nose/Throat: Hearing grossly intact, nares w/o erythema or drainage Eyes: Conjunctiva clear. Sclera non-icteric Neck: Supple.  Trachea midline Pulmonary:  Good air movement, no use of accessory muscles.  Cardiac: Irregular Vascular:  Vessel Right Left  Radial Palpable Palpable                          PT Palpable Palpable  DP Palpable Palpable   Gastrointestinal: soft, non-tender/non-distended. No guarding/reflex.  Musculoskeletal: M/S 5/5 throughout.  No deformity or atrophy.  Right foot wound is dressed.  Trace lower extremity edema. Neurologic: Sensation grossly intact in extremities.  Symmetrical.  Speech is fluent.  Psychiatric: Judgment intact, Mood & affect appropriate for pt's clinical situation.       Labs Recent Results (from the past 2160 hour(s))  Comprehensive metabolic panel     Status: Abnormal   Collection Time: 05/23/22  3:38 PM  Result Value Ref Range   Glucose, Bld 91 65 - 99 mg/dL    Comment: .            Fasting reference interval .    BUN 21 7 - 25 mg/dL   Creat (H) - 1.22 mg/dL   BUN/Creatinine Ratio 15 6 - 22 (calc)   Sodium 138 135 - 146 mmol/L   Potassium 4.7 3.5 - 5.3 mmol/L   Chloride 103 98 - 110 mmol/L   CO2 24 20 - 32 mmol/L   Calcium 9.2 8.6 - 10.3 mg/dL   Total Protein 6.9 6.1 - 8.1 g/dL   Albumin 4.5 3.6 - 5.1 g/dL   Globulin 2.4 1.9 - 3.7 g/dL (calc)   AG Ratio 1.9 1.0 - 2.5 (calc)   Total Bilirubin 1.7 (H) 0.2 - 1.2 mg/dL   Alkaline phosphatase (APISO) 53 35 - 144 U/L   AST 24 10 - 35 U/L   ALT 25 9 - 46 U/L  Lipid panel     Status: None   Collection Time: 05/23/22  3:38 PM  Result Value Ref Range   Cholesterol 149 <200 mg/dL   HDL 49 > OR = 40 mg/dL  Triglycerides 111 <150 mg/dL   LDL  Cholesterol (Calc) 79 mg/dL (calc)    Comment: Reference range: <100 . Desirable range <100 mg/dL for primary prevention;   <70 mg/dL for patients with CHD or diabetic patients  with > or = 2 CHD risk factors. Marland Kitchen LDL-C is now calculated using the Martin-Hopkins  calculation, which is a validated novel method providing  better accuracy than the Friedewald equation in the  estimation of LDL-C.  Horald Pollen et al. Lenox Ahr. 4174;081(44): 2061-2068  (http://education.[facility].com/faq/FAQ164)    Total CHOL/HDL Ratio 3.0 <5.0 (calc)   Non-HDL Cholesterol (Calc) 100 <130 mg/dL (calc)    Comment: For patients with diabetes plus 1 major ASCVD risk  factor, treating to a non-HDL-C goal of <100 mg/dL  (LDL-C of <81 mg/dL) is considered a therapeutic  option.     Radiology No results found.  Assessment/Plan  Hypertension blood pressure control important in reducing the progression of atherosclerotic disease. On appropriate oral medications.   Leg swelling Actually doing quite well with minimal swelling currently.   Toe ulcer, right (HCC) ABIs today are noncompressible but the digit pressures remain normal and the waveforms remain multiphasic.  Perfusion appears adequate for wound healing.  No role for intervention.  At this point, I am going to check him annually.   Festus Barren, MD  06/24/2022 3:30 PM    This note was created with Dragon medical transcription system.  Any errors from dictation are purely unintentional

## 2022-07-02 ENCOUNTER — Ambulatory Visit (INDEPENDENT_AMBULATORY_CARE_PROVIDER_SITE_OTHER): Payer: Medicare Other | Admitting: Urology

## 2022-07-02 VITALS — BP 153/76 | HR 75 | Ht 72.0 in | Wt 264.0 lb

## 2022-07-02 DIAGNOSIS — N138 Other obstructive and reflux uropathy: Secondary | ICD-10-CM | POA: Diagnosis not present

## 2022-07-02 DIAGNOSIS — N401 Enlarged prostate with lower urinary tract symptoms: Secondary | ICD-10-CM

## 2022-07-02 LAB — BLADDER SCAN AMB NON-IMAGING

## 2022-07-02 MED ORDER — FINASTERIDE 5 MG PO TABS: 5.0000 mg | ORAL_TABLET | Freq: Every day | ORAL | 3 refills | Status: DC

## 2022-07-02 MED ORDER — TAMSULOSIN HCL 0.4 MG PO CAPS: 0.4000 mg | ORAL_CAPSULE | Freq: Every day | ORAL | 3 refills | Status: DC

## 2022-07-02 NOTE — Progress Notes (Signed)
07/02/2022 1:05 PM   Terry Vaughn [birth year] [id number]  Referring provider: Allegra Grana, FNP [address] [place],  [place] [address]  Chief Complaint  Patient presents with   Follow-up    Urinary obstruction    HPI: 88 year old male with a personal history of urinary retention, sepsis of urinary source, severe epididymoorchitis returns today for follow-up for 1 year with IPSS/PVR/UA.  At one point, he was self cathing but we were able to stop this after he demonstrated adequate emptying.  He continues on chronic Flomax and finasteride.  He reports today that he has been doing extremely well.  IPSS as below.  He feels that he is able to empty for the most part..  Urinalysis is negative.  He is following with nephrology, Dr. Cherylann Ratel to ensure there is that his renal function remained stable.  He had a renal ultrasound in January which showed right renal atrophy otherwise no hydronephrosis.  Creatinine has been stable around though 1.4-1.5 range.   Results for orders placed or performed in visit on 07/02/22  Bladder Scan (Post Void Residual) in office  Result Value Ref Range   Scan Result       IPSS     Row Name 07/02/22 1000         International Prostate Symptom Score   How often have you had the sensation of not emptying your bladder? Less than 1 in 5     How often have you had to urinate less than every two hours? Less than 1 in 5 times     How often have you found you stopped and started again several times when you urinated? Less than 1 in 5 times     How often have you found it difficult to postpone urination? Less than 1 in 5 times     How often have you had a weak urinary stream? More than half the time     How often have you had to strain to start urination? Less than 1 in 5 times     How many times did you typically get up at night to urinate? 2 Times     Total IPSS Score 11       Quality of Life due to urinary symptoms   If you  were to spend the rest of your life with your urinary condition just the way it is now how would you feel about that? Mixed              Score:  1-7 Mild 8-19 Moderate 20-35 Severe    PMH: Past Medical History:  Diagnosis Date   Bone spur 1988   left heel   Bursitis 1987   right shoulder   Cellulitis 1986   right leg   Pneumonia    noted 07/17/17 CXR    Shingles 1959   Urinary retention    UTI (urinary tract infection)    sepsis E coli UTI +urine and blood cultures 07/17/17     Surgical History: Past Surgical History:  Procedure Laterality Date   APPENDECTOMY  1942   CHOLECYSTECTOMY  1966   EYE SURGERY  2015   catarcts extracted   IRRIGATION AND DEBRIDEMENT FOOT Right 12/09/2019   Procedure: IRRIGATION AND DEBRIDEMENT HEEL;  Surgeon: Rosetta Posner, DPM;  Location: [facility] OR[facility];  Service: Podiatry;  Laterality: Right;   JOINT REPLACEMENT     right hip 20 + years from 2019, left hip replaced 05/01/17  MELANOMA EXCISION  04.2010   mole removal right arm   TONSILLECTOMY AND ADENOIDECTOMY  1944   TOTAL HIP ARTHROPLASTY  1999   right hip    Home Medications:  Allergies as of 07/02/2022       Reactions   Codeine Other (See Comments)   Alters mental state        Medication List        Accurate as of July 02, 2022  1:05 PM. If you have any questions, ask your nurse or doctor.          docusate sodium 100 MG capsule Commonly known as: COLACE Take 100 mg by mouth daily as needed for mild constipation.   finasteride 5 MG tablet Commonly known as: PROSCAR Take 1 tablet (5 mg total) by mouth daily.   losartan 25 MG tablet Commonly known as: COZAAR   tamsulosin 0.4 MG Caps capsule Commonly known as: FLOMAX Take 1 capsule (0.4 mg total) by mouth daily.        Allergies:  Allergies  Allergen Reactions   Codeine Other (See Comments)    Alters mental state    Family History: Family History  Problem Relation Age of Onset   Clotting disorder  Mother    Heart disease Father 6   Cancer Sister        breast   Cerebral aneurysm Brother        hemmorage   Cancer Daughter        breast    Social History:  reports that he has never smoked. He has never used smokeless tobacco. He reports that he does not drink alcohol and does not use drugs.   Physical Exam: BP (!) 153/76   Pulse 75   Ht 6' (1.829 m)   Wt 264 lb (119.7 kg)   BMI 35.80 kg/m   Constitutional:  Alert and oriented, No acute distress.  Accompanied by son today. HEENT: [place] AT, moist mucus membranes.  Trachea midline, no masses. Cardiovascular: No clubbing, cyanosis, or edema. Respiratory: Normal respiratory effort, no increased work of breathing. Skin: No rashes, bruises or suspicious lesions. Neurologic: Grossly intact, no focal deficits, moving all 4 extremities. Psychiatric: Normal mood and affect.  Laboratory Data: Lab Results  Component Value Date   WBC 7.2 05/22/2021   HGB 14.0 05/22/2021   HCT 40.7 05/22/2021   MCV 96.8 05/22/2021   PLT 283.0 05/22/2021    Lab Results  Component Value Date   CREATININE 1.44 (H) 05/23/2022    Lab Results  Component Value Date   HGBA1C 5.3 05/22/2021    Urinalysis UA today  negative  Pertinent Imaging: US RENAL  Narrative CLINICAL DATA:  Stage III A chronic kidney disease  EXAM: RENAL / URINARY TRACT ULTRASOUND COMPLETE  COMPARISON:  None.  FINDINGS: Right Kidney:  Renal measurements: 10.4 x 4.9 x 4.8 cm = volume: 127 mL. There is moderate to severe renal cortical atrophy, more severe within the upper pole. Renal cortical echogenicity is normal. No intrarenal masses or calcifications are seen. A 2.7 cm simple cyst is seen within the interpolar region of the right kidney. No hydronephrosis.  Left Kidney:  Renal measurements: 11.0 x 5.2 x 4.4 cm = volume: 133 mL. Echogenicity within normal limits. No mass or hydronephrosis visualized.  Bladder:  Appears normal for degree of bladder  distention.  Other:  None.  IMPRESSION: Asymmetric right renal cortical atrophy.  No hydronephrosis.   Electronically Signed By: Fidela Salisbury M.D. On: 08/01/2021  22:24  Ultrasound personally reviewed  Assessment & Plan:    1. BPH with urinary obstruction Symptoms and emptying stable on Flomax and finasteride  Alternatives including outlet surgery were discussed and reviewed, not interested and wishes a small  Chronic right renal atrophy but no obstruction hydronephrosis or incomplete emptying contributing to his renal dysfunction  Will continue to follow him on an annual basis given his history  Medications refilled  - Urinalysis, Complete   F/u 1 year with IPSS/ PVR  Hollice Espy, MD  [place] [address], [place], [place] [address] [phone]

## 2022-07-03 LAB — URINALYSIS, COMPLETE
Bilirubin, UA: NEGATIVE
Glucose, UA: NEGATIVE
Ketones, UA: NEGATIVE
Nitrite, UA: NEGATIVE
Protein,UA: NEGATIVE
RBC, UA: NEGATIVE
Specific Gravity, UA: 1.01 (ref 1.005–1.030)
Urobilinogen, Ur: 0.2 mg/dL (ref 0.2–1.0)
pH, UA: 6.5 (ref 5.0–7.5)

## 2022-07-03 LAB — MICROSCOPIC EXAMINATION
Bacteria, UA: NONE SEEN
RBC, Urine: NONE SEEN /hpf (ref 0–2)

## 2022-08-14 ENCOUNTER — Ambulatory Visit (INDEPENDENT_AMBULATORY_CARE_PROVIDER_SITE_OTHER): Payer: Medicare Other | Admitting: Podiatry

## 2022-08-14 ENCOUNTER — Encounter: Payer: Self-pay | Admitting: Podiatry

## 2022-08-14 VITALS — BP 134/68 | HR 73

## 2022-08-14 DIAGNOSIS — B351 Tinea unguium: Secondary | ICD-10-CM

## 2022-08-14 DIAGNOSIS — I739 Peripheral vascular disease, unspecified: Secondary | ICD-10-CM

## 2022-08-14 DIAGNOSIS — M79676 Pain in unspecified toe(s): Secondary | ICD-10-CM

## 2022-08-14 DIAGNOSIS — L97412 Non-pressure chronic ulcer of right heel and midfoot with fat layer exposed: Secondary | ICD-10-CM | POA: Diagnosis not present

## 2022-08-14 DIAGNOSIS — M2030 Hallux varus (acquired), unspecified foot: Secondary | ICD-10-CM

## 2022-08-14 NOTE — Progress Notes (Signed)
Complaint:  Visit Type: Patient returns to my office for continued preventative foot care services. Complaint: Patient states" my nails have grown long and thick and become painful to walk and wear shoes"  The patient presents for preventative foot care services. No changes to ROS.  Patient was diagnosed with an ulcer right heel.  He was treated and is followed by another podiatrist at another facility.  He requests only nail care be provided today.  Podiatric Exam: Vascular: dorsalis pedis and posterior tibial pulses are palpable bilateral. Capillary return is immediate. Temperature gradient is WNL. Skin turgor WNL  Sensorium: Normal Semmes Weinstein monofilament test. Normal tactile sensation bilaterally. Nail Exam: Pt has thick disfigured discolored nails with subungual debris noted bilateral entire nail hallux through fifth toenails Ulcer Exam: Heel ulcer healing right foot. Orthopedic Exam: Muscle tone and strength are WNL. No limitations in general ROM. No crepitus or effusions noted. Foot type and digits show no abnormalities. Bony prominences are unremarkable. Skin: No Porokeratosis. No infection or ulcers.   Diagnosis:  Onychomycosis, , Pain in right toe, pain in left toes    Treatment & Plan Procedures and Treatment: Consent by patient was obtained for treatment procedures. The patient understood the discussion of treatment and procedures well. All questions were answered thoroughly reviewed. Debridement of mycotic and hypertrophic toenails, 1 through 5 bilateral and clearing of subungual debris. No ulceration, no infection noted. Return Visit-Office Procedure: Patient instructed to return to the office for a follow up visit 3 months for continued evaluation and treatment.    Gardiner Barefoot DPM

## 2022-10-28 ENCOUNTER — Ambulatory Visit (INDEPENDENT_AMBULATORY_CARE_PROVIDER_SITE_OTHER): Payer: Medicare Other

## 2022-10-28 ENCOUNTER — Ambulatory Visit (INDEPENDENT_AMBULATORY_CARE_PROVIDER_SITE_OTHER): Payer: Medicare Other | Admitting: Podiatry

## 2022-10-28 VITALS — BP 156/77 | HR 67

## 2022-10-28 DIAGNOSIS — L97412 Non-pressure chronic ulcer of right heel and midfoot with fat layer exposed: Secondary | ICD-10-CM

## 2022-10-28 DIAGNOSIS — L97411 Non-pressure chronic ulcer of right heel and midfoot limited to breakdown of skin: Secondary | ICD-10-CM

## 2022-10-28 NOTE — Progress Notes (Signed)
Chief Complaint  Patient presents with   Wound Check    Patient's stated, "It needs to be looked at, the callus has built up." N - callus L - heel plantar rt D - 3 yrs. O - gradually builds back up C - painful, drainage A - walking, standing T - bandages, get it trimmed, callus pad, saw Dr. Luana Shu    HPI: 89 y.o. male presenting today for second opinion of a nonhealing ulcer to the plantar aspect of the right heel.  Patient states that he has had a heel ulcer for about 3 years now being treated and managed at [facility] clinic podiatry.  There has been no improvement and he presents today for second opinion and further treatment and evaluation  Past Medical History:  Diagnosis Date   Bone spur 1988   left heel   Bursitis 1987   right shoulder   Cellulitis 1986   right leg   Pneumonia    noted 07/17/17 CXR    Shingles 1959   Urinary retention    UTI (urinary tract infection)    sepsis E coli UTI +urine and blood cultures 07/17/17     Past Surgical History:  Procedure Laterality Date   [place]   EYE SURGERY  2015   catarcts extracted   IRRIGATION AND DEBRIDEMENT FOOT Right 12/09/2019   Procedure: IRRIGATION AND DEBRIDEMENT HEEL;  Surgeon: Caroline More, DPM;  Location: [facility] ORS;  Service: Podiatry;  Laterality: Right;   JOINT REPLACEMENT     right hip 20 + years from 2019, left hip replaced 05/01/17    MELANOMA EXCISION  04.2010   mole removal right arm   TONSILLECTOMY AND ADENOIDECTOMY  1944   TOTAL HIP ARTHROPLASTY  1999   right hip    Allergies  Allergen Reactions   Codeine Other (See Comments)    Alters mental state     Physical Exam: General: The patient is alert and oriented x3 in no acute distress.  Dermatology: Skin is warm, dry and supple bilateral lower extremities. Negative for open lesions or macerations.  Vascular: Palpable pedal pulses bilaterally. Capillary refill within normal limits.  Negative for any  significant edema or erythema  Neurological: Light touch and protective threshold grossly intact  Musculoskeletal Exam: Cavus foot type noted with increased dorsiflexion of the ankle joint.  Compromised plantarflexion against resistance compared to the contralateral limb  Radiographic Exam RT foot 10/28/2022:  Normal osseous mineralization.  No osseous erosions noted.  Plantar heel spur noted which does not seem to contribute to the plantar ulcer.  Steep increased calcaneal inclination angle noted with calcifications along the Achilles tendon mid substance both alluding to history of Achilles compromise or possible Achilles tendon rupture  Assessment: 1.  Ulcer plantar heel right 2.  Achilles compromise right with this foot type   Plan of Care:  1. Patient evaluated. X-Rays reviewed with the patient and son demonstrating compromise of the Achilles tendon and calcifications along the Achilles..  2.  Explained to the patient that I do not believe the heel ulcer is improving or resolved because of the amount of pressure during ambulation. 3.  Appointment with orthotics department for new custom molded insoles to aggressively offload pressure from the heel and support the medial longitudinal arch of the foot. 4.  We did discuss the possibility of surgery but the patient declined.  Given the patient's age I do agree that we should exhaust all conservative options  5.  Return to clinic 4 weeks      Edrick Kins, DPM [facility]  Dr. Edrick Kins, DPM    [address], [place] [address]                Office [phone]  Fax [phone]

## 2022-11-13 ENCOUNTER — Ambulatory Visit (INDEPENDENT_AMBULATORY_CARE_PROVIDER_SITE_OTHER): Payer: Medicare Other | Admitting: Podiatry

## 2022-11-13 ENCOUNTER — Other Ambulatory Visit: Payer: Medicare Other

## 2022-11-13 ENCOUNTER — Encounter: Payer: Self-pay | Admitting: Podiatry

## 2022-11-13 VITALS — BP 152/70 | HR 73

## 2022-11-13 DIAGNOSIS — B351 Tinea unguium: Secondary | ICD-10-CM | POA: Diagnosis not present

## 2022-11-13 DIAGNOSIS — M2030 Hallux varus (acquired), unspecified foot: Secondary | ICD-10-CM

## 2022-11-13 DIAGNOSIS — I739 Peripheral vascular disease, unspecified: Secondary | ICD-10-CM | POA: Diagnosis not present

## 2022-11-13 DIAGNOSIS — M79676 Pain in unspecified toe(s): Secondary | ICD-10-CM

## 2022-11-13 NOTE — Progress Notes (Signed)
Complaint:  Visit Type: Patient returns to my office for continued preventative foot care services. Complaint: Patient states" my nails have grown long and thick and become painful to walk and wear shoes"  The patient presents for preventative foot care services. No changes to ROS.  Patient was diagnosed with an ulcer right heel.  He was treated and is followed by another podiatrist at another facility.  He requests only nail care be provided today.  Podiatric Exam: Vascular: dorsalis pedis and posterior tibial pulses are palpable bilateral. Capillary return is immediate. Temperature gradient is WNL. Skin turgor WNL  Sensorium: Normal Semmes Weinstein monofilament test. Normal tactile sensation bilaterally. Nail Exam: Pt has thick disfigured discolored nails with subungual debris noted bilateral entire nail hallux through fifth toenails Ulcer Exam: Heel ulcer healing right foot. Orthopedic Exam: Muscle tone and strength are WNL. No limitations in general ROM. No crepitus or effusions noted. Foot type and digits show no abnormalities. Bony prominences are unremarkable. Skin: No Porokeratosis. No infection or ulcers.   Diagnosis:  Onychomycosis, , Pain in right toe, pain in left toes    Treatment & Plan Procedures and Treatment: Consent by patient was obtained for treatment procedures. The patient understood the discussion of treatment and procedures well. All questions were answered thoroughly reviewed. Debridement of mycotic and hypertrophic toenails, 1 through 5 bilateral and clearing of subungual debris. No ulceration, no infection noted. Return Visit-Office Procedure: Patient instructed to return to the office for a follow up visit 3 months for continued evaluation and treatment.    Tazia Illescas DPM 

## 2022-12-02 ENCOUNTER — Ambulatory Visit (INDEPENDENT_AMBULATORY_CARE_PROVIDER_SITE_OTHER): Payer: Medicare Other | Admitting: Podiatry

## 2022-12-02 DIAGNOSIS — L97412 Non-pressure chronic ulcer of right heel and midfoot with fat layer exposed: Secondary | ICD-10-CM | POA: Diagnosis not present

## 2022-12-02 NOTE — Progress Notes (Signed)
Chief Complaint  Patient presents with   Foot Ulcer    Patient came in today for right heel ulcer follow-up, patient denies any pain, some bleeding     HPI: 89 y.o. male presenting today for evaluation of a nonhealing ulcer to the plantar aspect of the right heel.  Patient states that he has had a heel ulcer for about 3 years now being treated and managed at [facility] clinic podiatry.    Past Medical History:  Diagnosis Date   Bone spur 1988   left heel   Bursitis 1987   right shoulder   Cellulitis 1986   right leg   Pneumonia    noted 07/17/17 CXR    Shingles 1959   Urinary retention    UTI (urinary tract infection)    sepsis E coli UTI +urine and blood cultures 07/17/17     Past Surgical History:  Procedure Laterality Date   APPENDECTOMY  1942   CHOLECYSTECTOMY  1966   EYE SURGERY  2015   catarcts extracted   IRRIGATION AND DEBRIDEMENT FOOT Right 12/09/2019   Procedure: IRRIGATION AND DEBRIDEMENT HEEL;  Surgeon: Rosetta Posner, DPM;  Location: [facility] ORS;  Service: Podiatry;  Laterality: Right;   JOINT REPLACEMENT     right hip 20 + years from 2019, left hip replaced 05/01/17    MELANOMA EXCISION  04.2010   mole removal right arm   TONSILLECTOMY AND ADENOIDECTOMY  1944   TOTAL HIP ARTHROPLASTY  1999   right hip    Allergies  Allergen Reactions   Codeine Other (See Comments)    Alters mental state     Physical Exam: General: The patient is alert and oriented x3 in no acute distress.  Dermatology: Ulcer noted right plantar heel measuring approximately 0.5 x 0.5 to 0.3 cm.  Granular wound base.  Periwound is callused.  No exposed bone.  It appears stable  Vascular: Palpable pedal pulses bilaterally. Capillary refill within normal limits.  Negative for any significant edema or erythema  Neurological: Light touch and protective threshold grossly intact  Musculoskeletal Exam: Cavus foot type noted with increased dorsiflexion of the ankle joint.  Compromised  plantarflexion against resistance compared to the contralateral limb  Radiographic Exam RT foot 10/28/2022:  Normal osseous mineralization.  No osseous erosions noted.  Plantar heel spur noted which does not seem to contribute to the plantar ulcer.  Steep increased calcaneal inclination angle noted with calcifications along the Achilles tendon mid substance both alluding to history of Achilles compromise or possible Achilles tendon rupture  Assessment: 1.  Ulcer plantar heel right 2.  Achilles compromise right with this foot type   Plan of Care:  1. Patient evaluated.  Medically necessary excisional debridement including subcutaneous tissue was performed using a tissue nipper.  Excisional debridement of the necrotic nonviable tissue down to healthier bleeding viable tissue was performed with postdebridement measurement same as pre-. 2.  Explained to the patient that I do not believe the heel ulcer is improving or resolved because of the amount of pressure during ambulation. 3.  Patient has already been molded for custom molded insoles to aggressively offload pressure from the heel and support the medial longitudinal arch of the foot.  Dispensable pending 4.  Continue conservative treatment 5.  Return to clinic 4 weeks      Felecia Shelling, DPM [facility]  Dr. Felecia Shelling, DPM    [address]  [place], [place] [address]                Office [phone]  Fax [phone]

## 2022-12-19 ENCOUNTER — Ambulatory Visit: Payer: Medicare Other

## 2022-12-19 DIAGNOSIS — L97412 Non-pressure chronic ulcer of right heel and midfoot with fat layer exposed: Secondary | ICD-10-CM

## 2022-12-19 DIAGNOSIS — M2030 Hallux varus (acquired), unspecified foot: Secondary | ICD-10-CM

## 2022-12-19 NOTE — Progress Notes (Unsigned)
Patient presents today to pick up custom molded foot orthotics recommended by Dr. EVANS.   Orthotics were dispensed and fit was satisfactory. Reviewed instructions for break-in and wear. Written instructions given to patient.  Patient will follow up as needed.   

## 2022-12-29 ENCOUNTER — Other Ambulatory Visit: Payer: Self-pay

## 2022-12-29 ENCOUNTER — Emergency Department: Payer: Medicare Other

## 2022-12-29 ENCOUNTER — Emergency Department
Admission: EM | Admit: 2022-12-29 | Discharge: 2022-12-29 | Disposition: A | Discharge status: Home / Self Care | Payer: Medicare Other | Attending: Emergency Medicine | Admitting: Emergency Medicine

## 2022-12-29 DIAGNOSIS — I10 Essential (primary) hypertension: Secondary | ICD-10-CM | POA: Diagnosis not present

## 2022-12-29 DIAGNOSIS — M25562 Pain in left knee: Secondary | ICD-10-CM | POA: Insufficient documentation

## 2022-12-29 DIAGNOSIS — M7122 Synovial cyst of popliteal space [Baker], left knee: Secondary | ICD-10-CM | POA: Diagnosis not present

## 2022-12-29 DIAGNOSIS — E669 Obesity, unspecified: Secondary | ICD-10-CM | POA: Insufficient documentation

## 2022-12-29 DIAGNOSIS — M79605 Pain in left leg: Secondary | ICD-10-CM | POA: Diagnosis not present

## 2022-12-29 LAB — CBC WITH DIFFERENTIAL/PLATELET
Abs Immature Granulocytes: 0.02 10*3/uL (ref 0.00–0.07)
Basophils Absolute: 0 10*3/uL (ref 0.0–0.1)
Basophils Relative: 1 %
Eosinophils Absolute: 0 10*3/uL (ref 0.0–0.5)
Eosinophils Relative: 0 %
HCT: 39.2 % (ref 39.0–52.0)
Hemoglobin: 13.2 g/dL (ref 13.0–17.0)
Immature Granulocytes: 0 %
Lymphocytes Relative: 18 %
Lymphs Abs: 1.5 10*3/uL (ref 0.7–4.0)
MCH: 32.1 pg (ref 26.0–34.0)
MCHC: 33.7 g/dL (ref 30.0–36.0)
MCV: 95.4 fL (ref 80.0–100.0)
Monocytes Absolute: 0.7 10*3/uL (ref 0.1–1.0)
Monocytes Relative: 9 %
Neutro Abs: 6.2 10*3/uL (ref 1.7–7.7)
Neutrophils Relative %: 72 %
Platelets: 295 10*3/uL (ref 150–400)
RBC: 4.11 MIL/uL — ABNORMAL LOW (ref 4.22–5.81)
RDW: 14.6 % (ref 11.5–15.5)
WBC: 8.4 10*3/uL (ref 4.0–10.5)
nRBC: 0 % (ref 0.0–0.2)

## 2022-12-29 LAB — COMPREHENSIVE METABOLIC PANEL
ALT: 19 U/L (ref 0–44)
AST: 33 U/L (ref 15–41)
Albumin: 4 g/dL (ref 3.5–5.0)
Alkaline Phosphatase: 49 U/L (ref 38–126)
Anion gap: 11 (ref 5–15)
BUN: 13 mg/dL (ref 8–23)
CO2: 21 mmol/L — ABNORMAL LOW (ref 22–32)
Calcium: 8.5 mg/dL — ABNORMAL LOW (ref 8.9–10.3)
Chloride: 106 mmol/L (ref 98–111)
Creatinine, Ser: 1.23 mg/dL (ref 0.61–1.24)
GFR, Estimated: 56 mL/min — ABNORMAL LOW (ref >60)
Glucose, Bld: 133 mg/dL — ABNORMAL HIGH (ref 70–99)
Potassium: 3.7 mmol/L (ref 3.5–5.1)
Sodium: 138 mmol/L (ref 135–145)
Total Bilirubin: 2.4 mg/dL — ABNORMAL HIGH (ref 0.3–1.2)
Total Protein: 6.7 g/dL (ref 6.5–8.1)

## 2022-12-29 LAB — PROTIME-INR
INR: 1.1 (ref 0.8–1.2)
Prothrombin Time: 14.3 seconds (ref 11.4–15.2)

## 2022-12-29 NOTE — ED Notes (Signed)
Pt verbalizes understanding of discharge instructions. Opportunity for questioning and answers were provided. Pt discharged from ED to home with family.    

## 2022-12-29 NOTE — Discharge Instructions (Signed)
You were found to have a Baker's cyst on your left leg which is why you are having the pain that you are having.  Your labs otherwise were normal and her x-ray was normal.  Please follow-up with orthopedics as we discussed.  Please return for any new, worsening, or change in symptoms or other concerns.

## 2022-12-29 NOTE — ED Triage Notes (Signed)
Pt to ED for left leg pain for the past couple days. Reports difficulty weight bearing. Not redness, swelling, wounds noted.

## 2022-12-29 NOTE — ED Provider Triage Note (Signed)
Emergency Medicine Provider Triage Evaluation Note  Terry Vaughn , a 89 y.o. male  was evaluated in triage.  Pt complains of let leg pain, fam hx of blood clots, no known injury.  Review of Systems  Positive:  Negative:   Physical Exam  Ht 6\' 2"  (1.88 m)   BMI 33.90 kg/m  Gen:   Awake, no distress   Resp:  Normal effort  MSK:   Moves extremities without difficulty  Other:    Medical Decision Making  Medically screening exam initiated at 12:16 PM.  Appropriate orders placed.  Terry Vaughn was informed that the remainder of the evaluation will be completed by another provider, this initial triage assessment does not replace that evaluation, and the importance of remaining in the ED until their evaluation is complete.     Faythe Ghee, PA-C 12/29/22 1217

## 2022-12-29 NOTE — ED Provider Notes (Signed)
[facility] Provider Note    Event Date/Time   First MD Initiated Contact with Patient 12/29/22 1446     (approximate)   History   Leg Pain   HPI  Terry Vaughn is a 89 y.o. male with a past medical history of varicose veins, osteoarthritis, hypertension, obesity presents today for evaluation of left leg pain.  Patient reports that his pain is behind his left knee and radiates down to his calf.  He reports that he is able to weight-bear, though his pain is worse with weightbearing.  He denies any numbness or tingling.  He has not had any weakness in his leg.  He has not had any fevers or chills.  He has not noticed any skin changes.  He denies any back pain or abdominal pain.  He denies history of DVT.  He is with his son.  Patient Active Problem List   Diagnosis Date Noted   Osteomyelitis (HCC) 12/07/2019   Weight gain 04/16/2018   BPH with urinary obstruction 07/18/2017   Colitis due to Clostridium difficile 07/18/2017   S/P total hip arthroplasty 07/06/2017   Primary osteoarthritis of left hip 05/27/2017   Acute renal insufficiency    Infected hydrocele    Primary osteoarthritis involving multiple joints 05/07/2017   Left hip pain 04/15/2017   Right foot ulcer (HCC) 05/20/2016   Varicose veins of leg with swelling, right 05/20/2016   Leg swelling 05/08/2016   Osteoarthritis of knee 05/08/2016   Achilles tendinitis 03/06/2016   Pain in left foot 03/06/2016   Toe ulcer, right (HCC) 12/14/2015   Routine physical examination 01/28/2012   Elevated bilirubin 01/28/2012   Hypertension 12/12/2011   Edema 12/12/2011   Obesity 12/12/2011          Physical Exam   Triage Vital Signs: ED Triage Vitals  Enc Vitals Group     BP 12/29/22 1217 (!) 149/112     Pulse Rate 12/29/22 1217 (!) 101     Resp 12/29/22 1217 18     Temp 12/29/22 1217 98.2 F (36.8 C)     Temp src --      SpO2 12/29/22 1217 97 %     Weight --      Height 12/29/22 1215 6'  2" (1.88 m)     Head Circumference --      Peak Flow --      Pain Score 12/29/22 1215 3     Pain Loc --      Pain Edu? --      Excl. in GC? --     Most recent vital signs: Vitals:   12/29/22 1217 12/29/22 1515  BP: (!) 149/112 (!) 110/56  Pulse: (!) 101 83  Resp: 18 18  Temp: 98.2 F (36.8 C)   SpO2: 97% 95%    Physical Exam Vitals and nursing note reviewed.  Constitutional:      General: Awake and alert. No acute distress.    Appearance: Normal appearance. The patient is obese.  HENT:     Head: Normocephalic and atraumatic.     Mouth: Mucous membranes are moist.  Eyes:     General: PERRL. Normal EOMs        Right eye: No discharge.        Left eye: No discharge.     Conjunctiva/sclera: Conjunctivae normal.  Cardiovascular:     Rate and Rhythm: Normal rate    Pulses: Normal pulses.  Pulmonary:  Effort: Pulmonary effort is normal. No respiratory distress.     Breath sounds: Normal breath sounds.  Abdominal:     Abdomen is soft. There is no abdominal tenderness. No rebound or guarding. No distention. Musculoskeletal:        General: No swelling. Normal range of motion.     Cervical back: Normal range of motion and neck supple.  Left lower extremity: No obvious abnormalities noted.  He has tenderness to palpation to his left proximal calf and posterior knee, though no anterior knee tenderness.  There is no warmth or erythema to his knee.  He has full and normal active and passive range of motion of his knee, hip, ankle.  His compartments are soft and compressible throughout.  There is no pitting edema.  He has normal 2+ pedal pulses, normal capillary refill.  There is no warmth or erythema throughout his lower extremity. Back: No midline tenderness. Strength and sensation 5/5 to bilateral lower extremities. Normal great toe extension against resistance. Normal sensation throughout feet. Normal patellar reflexes. Negative SLR and opposite SLR bilaterally.  Skin:     General: Skin is warm and dry.     Capillary Refill: Capillary refill takes less than 2 seconds.     Findings: No rash.  Neurological:     Mental Status: The patient is awake and alert.      ED Results / Procedures / Treatments   Labs (all labs ordered are listed, but only abnormal results are displayed) Labs Reviewed  COMPREHENSIVE METABOLIC PANEL - Abnormal; Notable for the following components:      Result Value   CO2 21 (*)    Glucose, Bld 133 (*)    Calcium 8.5 (*)    Total Bilirubin 2.4 (*)    GFR, Estimated 56 (*)    All other components within normal limits  CBC WITH DIFFERENTIAL/PLATELET - Abnormal; Notable for the following components:   RBC 4.11 (*)    All other components within normal limits  CBC WITH DIFFERENTIAL/PLATELET  PROTIME-INR     EKG     RADIOLOGY I independently reviewed and interpreted imaging and agree with radiologists findings.     PROCEDURES:  Critical Care performed:   Procedures   MEDICATIONS ORDERED IN ED: Medications - No data to display   IMPRESSION / MDM / ASSESSMENT AND PLAN / ED COURSE  I reviewed the triage vital signs and the nursing notes.   Differential diagnosis includes, but is not limited to, DVT, calf strain, Baker's cyst, radiculopathy.  Patient is awake and alert, hemodynamically stable and afebrile.  He has 2+ pedal pulses, normal capillary refill, foot is warm and well-perfused, do not suspect acute arterial occlusion or claudication.  There is no warmth or erythema to his knee, he has full active and passive range of motion of his knee, no constitutional symptoms, not consistent with infectious etiology or septic joint.  Blood work obtained in triage is overall reassuring.  Ultrasound obtained is significant for a Baker's cyst in the location of his pain, no DVT.  I discussed this diagnosis with the patient and his son.  He was given an Ace wrap for extra support of his knee, though advised to remove this when  he sleeps at night so as to not increase his risk for developing a blood clot.  He was instructed to follow-up with orthopedic surgery for further management.  We discussed strict return precautions in the meantime.  The appropriate follow-up information was provided.  Patient  is ambulatory with a steady gait, at his baseline.  We discussed return precautions and the importance of close outpatient follow-up.  Patient and his son understand and agree with plan.  He was discharged in stable condition.   Patient's presentation is most consistent with acute complicated illness / injury requiring diagnostic workup.    FINAL CLINICAL IMPRESSION(S) / ED DIAGNOSES   Final diagnoses:  Left leg pain  Baker's cyst of knee, left     Rx / DC Orders   ED Discharge Orders     None        Note:  This document was prepared using Dragon voice recognition software and may include unintentional dictation errors.   Keturah Shavers 12/29/22 1844    Chesley Noon, MD 01/03/23 [phone]

## 2022-12-30 ENCOUNTER — Ambulatory Visit: Payer: Medicare Other | Admitting: Podiatry

## 2023-01-20 ENCOUNTER — Ambulatory Visit (INDEPENDENT_AMBULATORY_CARE_PROVIDER_SITE_OTHER): Payer: Medicare Other | Admitting: Podiatry

## 2023-01-20 DIAGNOSIS — L97412 Non-pressure chronic ulcer of right heel and midfoot with fat layer exposed: Secondary | ICD-10-CM | POA: Diagnosis not present

## 2023-01-20 DIAGNOSIS — M67873 Other specified disorders of tendon, right ankle and foot: Secondary | ICD-10-CM | POA: Diagnosis not present

## 2023-01-20 NOTE — Progress Notes (Signed)
Chief Complaint  Patient presents with   Foot Ulcer    Patient came in today for right heel ulcer- follow-up, drainage, some pain,     HPI: 89 y.o. male presenting today for evaluation of a nonhealing ulcer to the plantar aspect of the right heel.  Patient states that he has had a heel ulcer for about 3 years now being treated and managed at [facility] clinic podiatry.    Past Medical History:  Diagnosis Date   Bone spur 1988   left heel   Bursitis 1987   right shoulder   Cellulitis 1986   right leg   Pneumonia    noted 07/17/17 CXR    Shingles 1959   Urinary retention    UTI (urinary tract infection)    sepsis E coli UTI +urine and blood cultures 07/17/17     Past Surgical History:  Procedure Laterality Date   APPENDECTOMY  1942   CHOLECYSTECTOMY  1966   EYE SURGERY  2015   catarcts extracted   IRRIGATION AND DEBRIDEMENT FOOT Right 12/09/2019   Procedure: IRRIGATION AND DEBRIDEMENT HEEL;  Surgeon: Rosetta Posner, DPM;  Location: [facility] ORS;  Service: Podiatry;  Laterality: Right;   JOINT REPLACEMENT     right hip 20 + years from 2019, left hip replaced 05/01/17    MELANOMA EXCISION  04.2010   mole removal right arm   TONSILLECTOMY AND ADENOIDECTOMY  1944   TOTAL HIP ARTHROPLASTY  1999   right hip    Allergies  Allergen Reactions   Codeine Other (See Comments)    Alters mental state     Physical Exam: General: The patient is alert and oriented x3 in no acute distress.  Dermatology: Mostly unchanged.  Ulcer noted right plantar heel measuring approximately 0.5 x 0.5 to 0.3 cm.  Granular wound base.  Periwound is callused.  The wound does probe very deep likely down to the level of periosteal tissue.  There is no obvious exposed bone.  Serosanguineous drainage.  Vascular: Palpable pedal pulses bilaterally. Capillary refill within normal limits.  Negative for any significant edema or erythema.  Clinically there is no concern for vascular compromise  VAS [place] ABI W/WO TBI  06/24/2022 ABI Findings:  +---------+------------------+-----+---------+--------+  Right   Rt Pressure (mmHg)IndexWaveform Comment   +---------+------------------+-----+---------+--------+  Brachial 141                                       +---------+------------------+-----+---------+--------+  PTA     239               1.70 triphasic          +---------+------------------+-----+---------+--------+  DP      227               1.61 triphasic          +---------+------------------+-----+---------+--------+  Great Toe169               1.20 Normal             +---------+------------------+-----+---------+--------+   +---------+------------------+-----+---------+-------+  Left    Lt Pressure (mmHg)IndexWaveform Comment  +---------+------------------+-----+---------+-------+  Brachial 140                                      +---------+------------------+-----+---------+-------+  PTA     255  1.81 triphasic         +---------+------------------+-----+---------+-------+  DP      236               1.67 triphasic         +---------+------------------+-----+---------+-------+  Randie Heinz Toe191               1.35 Normal            +---------+------------------+-----+---------+-------+   +-------+----------------+-----------+----------------+------------+  ABI/TBIToday's ABI     Today's TBIPrevious ABI    Previous TBI  +-------+----------------+-----------+----------------+------------+  Right Non compressible1.20       Non compressible0.77          +-------+----------------+-----------+----------------+------------+  Left  Non compressible1.35       Non compressible0.88          +-------+----------------+-----------+----------------+------------+  Bilateral ABIs appear essentially unchanged compared to prior study on  06/25/2021.    Summary:  Right: Resting right ankle-brachial index indicates  noncompressible right  lower extremity arteries. The right toe-brachial index is normal.   Left: Resting left ankle-brachial index is within normal range. The left  toe-brachial index is normal.    Neurological: Light touch and protective threshold grossly intact  Musculoskeletal Exam: Cavus foot type noted with increased dorsiflexion of the ankle joint.  Compromised plantarflexion against resistance compared to the contralateral limb  Radiographic Exam RT foot 10/28/2022:  Normal osseous mineralization.  No osseous erosions noted.  Plantar heel spur noted which does not seem to contribute to the plantar ulcer.  Steep increased calcaneal inclination angle noted with calcifications along the Achilles tendon mid substance both alluding to history of Achilles compromise or possible Achilles tendon rupture  MR FOOT RIGHT WO CONTRAST 12/07/2019: Achilles: Thickening of the distal Achilles tendon measuring 16 mm in AP dimension with intermediate intrasubstance signal and a moderate partial-thickness insertional tear which measures 2.2 cm in transverse dimension (series 5, image 15; series 8, images 8-11). There are multiple ossifications within the distal tendon. No full-thickness Achilles tendon tear. No retrocalcaneal bursitis or peritendinous edema.  IMPRESSION: 1. Negative for acute osteomyelitis. 2. Soft tissue ulceration and cellulitis of the right heel underlying the posterior calcaneus without discrete sinus tract or fluid collection extending to the underlying cortex. No abnormal marrow signal within the calcaneus. 3. Distal Achilles tendinosis with moderate partial-thickness insertional tear. 4. Distal tibialis posterior tendinosis. 5. Findings suggesting chronic plantar fasciitis.  Assessment: 1.  Ulcer plantar heel right 2.  Achilles compromise right with this foot type   Plan of Care:  1. Patient evaluated.  Medically necessary excisional debridement including subcutaneous  tissue was performed using a tissue nipper.  Excisional debridement of the necrotic nonviable tissue down to healthier bleeding viable tissue was performed with postdebridement measurement same as pre-. -Continue daily dressing changes at home -Continue custom molded insoles with heel offloaded.  Additional quarter inch felt 'horseshoe' type padding was applied to the heel to add additional offloading to the central portion of the heel -Advised against going barefoot -Return to clinic 4 weeks     Felecia Shelling, DPM [facility]  Dr. Felecia Shelling, DPM    [address], [place] [address]  Office [phone]  Fax [phone]

## 2023-02-16 ENCOUNTER — Ambulatory Visit: Payer: Medicare Other | Admitting: Podiatry

## 2023-02-16 ENCOUNTER — Encounter: Payer: Self-pay | Admitting: Podiatry

## 2023-02-16 VITALS — BP 153/74

## 2023-02-16 DIAGNOSIS — M79676 Pain in unspecified toe(s): Secondary | ICD-10-CM

## 2023-02-16 DIAGNOSIS — B351 Tinea unguium: Secondary | ICD-10-CM | POA: Diagnosis not present

## 2023-02-17 ENCOUNTER — Encounter: Payer: Self-pay | Admitting: Podiatry

## 2023-02-17 ENCOUNTER — Ambulatory Visit: Payer: Medicare Other | Admitting: Podiatry

## 2023-02-17 VITALS — BP 145/74 | HR 68

## 2023-02-17 DIAGNOSIS — L97412 Non-pressure chronic ulcer of right heel and midfoot with fat layer exposed: Secondary | ICD-10-CM

## 2023-02-17 DIAGNOSIS — I739 Peripheral vascular disease, unspecified: Secondary | ICD-10-CM | POA: Diagnosis not present

## 2023-02-17 NOTE — Progress Notes (Signed)
Chief Complaint  Patient presents with   Wound Check    "It's doing better than it ever has under his care."    HPI: 89 y.o. male presenting today for evaluation of a nonhealing ulcer to the plantar aspect of the right heel.  Patient states that he has had a heel ulcer for about 3.5  years now being treated and managed at [facility] clinic podiatry.    Past Medical History:  Diagnosis Date   Bone spur 1988   left heel   Bursitis 1987   right shoulder   Cellulitis 1986   right leg   Pneumonia    noted 07/17/17 CXR    Shingles 1959   Urinary retention    UTI (urinary tract infection)    sepsis E coli UTI +urine and blood cultures 07/17/17     Past Surgical History:  Procedure Laterality Date   APPENDECTOMY  1942   CHOLECYSTECTOMY  1966   EYE SURGERY  2015   catarcts extracted   IRRIGATION AND DEBRIDEMENT FOOT Right 12/09/2019   Procedure: IRRIGATION AND DEBRIDEMENT HEEL;  Surgeon: Rosetta Posner, DPM;  Location: [facility] ORS;  Service: Podiatry;  Laterality: Right;   JOINT REPLACEMENT     right hip 20 + years from 2019, left hip replaced 05/01/17    MELANOMA EXCISION  04.2010   mole removal right arm   TONSILLECTOMY AND ADENOIDECTOMY  1944   TOTAL HIP ARTHROPLASTY  1999   right hip    Allergies  Allergen Reactions   Codeine Other (See Comments)    Alters mental state     RT heel 02/17/2023  Physical Exam: General: The patient is alert and oriented x3 in no acute distress.  Dermatology: Improved.  Ulcer noted right plantar heel measuring approximately 0.3 x 0.4 x 0.2 cm.  Granular wound base.  Periwound is callused.  The wound does probe very deep likely down to the level of periosteal tissue.  There is no obvious exposed bone.  Serosanguineous drainage.  Vascular: Palpable pedal pulses bilaterally. Capillary refill within normal limits.  Negative for any significant edema or erythema.  Clinically there is no concern for vascular compromise  VAS [place] ABI W/WO TBI  06/24/2022 ABI Findings:  +---------+------------------+-----+---------+--------+  Right   Rt Pressure (mmHg)IndexWaveform Comment   +---------+------------------+-----+---------+--------+  Brachial 141                                       +---------+------------------+-----+---------+--------+  PTA     239               1.70 triphasic          +---------+------------------+-----+---------+--------+  DP      227               1.61 triphasic          +---------+------------------+-----+---------+--------+  Great Toe169               1.20 Normal             +---------+------------------+-----+---------+--------+   +---------+------------------+-----+---------+-------+  Left    Lt Pressure (mmHg)IndexWaveform Comment  +---------+------------------+-----+---------+-------+  Brachial 140                                      +---------+------------------+-----+---------+-------+  PTA     255  1.81 triphasic         +---------+------------------+-----+---------+-------+  DP      236               1.67 triphasic         +---------+------------------+-----+---------+-------+  Randie Heinz Toe191               1.35 Normal            +---------+------------------+-----+---------+-------+   +-------+----------------+-----------+----------------+------------+  ABI/TBIToday's ABI     Today's TBIPrevious ABI    Previous TBI  +-------+----------------+-----------+----------------+------------+  Right Non compressible1.20       Non compressible0.77          +-------+----------------+-----------+----------------+------------+  Left  Non compressible1.35       Non compressible0.88          +-------+----------------+-----------+----------------+------------+  Bilateral ABIs appear essentially unchanged compared to prior study on  06/25/2021.    Summary:  Right: Resting right ankle-brachial index indicates  noncompressible right  lower extremity arteries. The right toe-brachial index is normal.   Left: Resting left ankle-brachial index is within normal range. The left  toe-brachial index is normal.    Neurological: Light touch and protective threshold grossly intact  Musculoskeletal Exam: Cavus foot type noted with increased dorsiflexion of the ankle joint.  Compromised plantarflexion against resistance compared to the contralateral limb  Radiographic Exam RT foot 10/28/2022:  Normal osseous mineralization.  No osseous erosions noted.  Plantar heel spur noted which does not seem to contribute to the plantar ulcer.  Steep increased calcaneal inclination angle noted with calcifications along the Achilles tendon mid substance both alluding to history of Achilles compromise or possible Achilles tendon rupture  MR FOOT RIGHT WO CONTRAST 12/07/2019: Achilles: Thickening of the distal Achilles tendon measuring 16 mm in AP dimension with intermediate intrasubstance signal and a moderate partial-thickness insertional tear which measures 2.2 cm in transverse dimension (series 5, image 15; series 8, images 8-11). There are multiple ossifications within the distal tendon. No full-thickness Achilles tendon tear. No retrocalcaneal bursitis or peritendinous edema.  IMPRESSION: 1. Negative for acute osteomyelitis. 2. Soft tissue ulceration and cellulitis of the right heel underlying the posterior calcaneus without discrete sinus tract or fluid collection extending to the underlying cortex. No abnormal marrow signal within the calcaneus. 3. Distal Achilles tendinosis with moderate partial-thickness insertional tear. 4. Distal tibialis posterior tendinosis. 5. Findings suggesting chronic plantar fasciitis.  Assessment: 1.  Ulcer plantar heel right 2.  Achilles compromise right with this foot type   Plan of Care:  1. Patient evaluated.  Medically necessary excisional debridement including subcutaneous  tissue was performed using a tissue nipper.  Excisional debridement of the necrotic nonviable tissue down to healthier bleeding viable tissue was performed with postdebridement measurement same as pre-. -Continue daily dressing changes at home -Continue custom molded insoles with heel offloaded.  Additional quarter inch felt 'horseshoe' type padding was applied to the heel to add additional offloading to the central portion of the heel -Advised against going barefoot -Nails were debrided by Dr. Donzetta Matters on 02/16/2023. -Return to clinic 4 weeks     Felecia Shelling, DPM [facility]  Dr. Felecia Shelling, DPM    [address], [place] [address]  Office [phone]  Fax [phone]

## 2023-02-21 ENCOUNTER — Encounter: Payer: Self-pay | Admitting: Podiatry

## 2023-02-21 NOTE — Progress Notes (Signed)
  Subjective:  Patient ID: Terry Vaughn, male    DOB: 10/26/1932,  MRN: [id number]  Terry Vaughn presents to clinic today for painful elongated mycotic toenails 1-5 bilaterally which are tender when wearing enclosed shoe gear. Pain is relieved with periodic professional debridement. He has h/o chronic foot ulcer which is managed by Dr. Logan Bores. He will be seeing Dr. Logan Bores on tomorrow. Patient would like to know if Dr. Logan Bores can cut his toenails to avoid so many appointments. His son is present during today's visit. Chief Complaint  Patient presents with   Nail Problem    RFC,Referring Provider Allegra Grana, FNP,lov:last year      New problem(s): None.   PCP is [name], Lyn Records, FNP.  Allergies  Allergen Reactions   Codeine Other (See Comments)    Alters mental state    Review of Systems: Negative except as noted in the HPI.  Objective: No changes noted in today's physical examination. Vitals:   02/16/23 1409  BP: (!) 153/74   RANA BARRUS is a pleasant 89 y.o. male in NAD. AAO x 3.  Vascular Examination: Capillary refill time immediate b/l. Vascular status intact b/l with palpable pedal pulses. Pedal hair absent b/l. No pain with calf compression b/l. Skin temperature gradient WNL b/l. No cyanosis or clubbing b/l. No ischemia or gangrene noted b/l.   Neurological Examination: Sensation grossly intact b/l with 10 gram monofilament.   Dermatological Examination: Pedal skin with normal turgor, texture and tone b/l. Dressing right heel which is clean, dry and intact.  No interdigital macerations.   Toenails 1-5 b/l thick, discolored, elongated with subungual debris and pain on dorsal palpation.   No corns, calluses nor porokeratotic lesions noted.  Musculoskeletal Examination: Muscle strength 5/5 to all LE muscle groups of left lower extremity. No pain, crepitus or joint limitation noted with ROM bilateral LE.  Radiographs: None  Assessment/Plan: 1. Pain due to  onychomycosis of toenail     Patient was evaluated and treated. All patient's and/or POA's questions/concerns addressed on today's visit. Toenails 1-5 debrided in length and girth without incident. Continue soft, supportive shoe gear daily. Report any pedal injuries to medical professional. Call office if there are any questions/concerns.  -Informed patient he may see Dr. Logan Bores for nail debridement if it falls on his visit with his wound care. He related understanding. -Patient/POA to call should there be question/concern in the interim.   Return in about 3 months (around 05/19/2023).  Freddie Breech, DPM

## 2023-03-17 ENCOUNTER — Ambulatory Visit (INDEPENDENT_AMBULATORY_CARE_PROVIDER_SITE_OTHER): Payer: Medicare Other | Admitting: Podiatry

## 2023-03-17 ENCOUNTER — Encounter: Payer: Self-pay | Admitting: Podiatry

## 2023-03-17 DIAGNOSIS — L97412 Non-pressure chronic ulcer of right heel and midfoot with fat layer exposed: Secondary | ICD-10-CM

## 2023-03-17 NOTE — Progress Notes (Signed)
Chief Complaint  Patient presents with   Wound Check    "I hope it's good."    HPI: 89 y.o. male presenting today for follow-up evaluation of a nonhealing ulcer to the plantar aspect of the right heel.  Patient states that he has had a heel ulcer for about 3.5  years now being treated and managed at [facility] clinic podiatry.  Overall there has been significant improvement and he is very satisfied  Past Medical History:  Diagnosis Date   Bone spur 1988   left heel   Bursitis 1987   right shoulder   Cellulitis 1986   right leg   Pneumonia    noted 07/17/17 CXR    Shingles 1959   Urinary retention    UTI (urinary tract infection)    sepsis E coli UTI +urine and blood cultures 07/17/17     Past Surgical History:  Procedure Laterality Date   APPENDECTOMY  1942   CHOLECYSTECTOMY  1966   EYE SURGERY  2015   catarcts extracted   IRRIGATION AND DEBRIDEMENT FOOT Right 12/09/2019   Procedure: IRRIGATION AND DEBRIDEMENT HEEL;  Surgeon: Rosetta Posner, DPM;  Location: [facility] ORS;  Service: Podiatry;  Laterality: Right;   JOINT REPLACEMENT     right hip 20 + years from 2019, left hip replaced 05/01/17    MELANOMA EXCISION  04.2010   mole removal right arm   TONSILLECTOMY AND ADENOIDECTOMY  1944   TOTAL HIP ARTHROPLASTY  1999   right hip    Allergies  Allergen Reactions   Codeine Other (See Comments)    Alters mental state     RT heel 02/17/2023  Physical Exam: General: The patient is alert and oriented x3 in no acute distress.  Dermatology: Continued improvement.  Ulcer noted right plantar heel measuring approximately 0.2 x 0.2 x 0.2 cm.  Granular wound base.  Periwound is callused.  The wound does probe very deep likely down to the level of periosteal tissue.  There is no obvious exposed bone.  Serosanguineous drainage.  Vascular: Palpable pedal pulses bilaterally. Capillary refill within normal limits.  Negative for any significant edema or erythema.  Clinically there is no  concern for vascular compromise  VAS [place] ABI W/WO TBI 06/24/2022 ABI Findings:  +---------+------------------+-----+---------+--------+  Right   Rt Pressure (mmHg)IndexWaveform Comment   +---------+------------------+-----+---------+--------+  Brachial 141                                       +---------+------------------+-----+---------+--------+  PTA     239               1.70 triphasic          +---------+------------------+-----+---------+--------+  DP      227               1.61 triphasic          +---------+------------------+-----+---------+--------+  Great Toe169               1.20 Normal             +---------+------------------+-----+---------+--------+   +---------+------------------+-----+---------+-------+  Left    Lt Pressure (mmHg)IndexWaveform Comment  +---------+------------------+-----+---------+-------+  Brachial 140                                      +---------+------------------+-----+---------+-------+  PTA  255               1.81 triphasic         +---------+------------------+-----+---------+-------+  DP      236               1.67 triphasic         +---------+------------------+-----+---------+-------+  Randie Heinz Toe191               1.35 Normal            +---------+------------------+-----+---------+-------+   +-------+----------------+-----------+----------------+------------+  ABI/TBIToday's ABI     Today's TBIPrevious ABI    Previous TBI  +-------+----------------+-----------+----------------+------------+  Right Non compressible1.20       Non compressible0.77          +-------+----------------+-----------+----------------+------------+  Left  Non compressible1.35       Non compressible0.88          +-------+----------------+-----------+----------------+------------+  Bilateral ABIs appear essentially unchanged compared to prior study on  06/25/2021.    Summary:  Right:  Resting right ankle-brachial index indicates noncompressible right  lower extremity arteries. The right toe-brachial index is normal.   Left: Resting left ankle-brachial index is within normal range. The left  toe-brachial index is normal.    Neurological: Light touch and protective threshold grossly intact  Musculoskeletal Exam: Cavus foot type noted with increased dorsiflexion of the ankle joint.  Compromised plantarflexion against resistance compared to the contralateral limb  Radiographic Exam RT foot 10/28/2022:  Normal osseous mineralization.  No osseous erosions noted.  Plantar heel spur noted which does not seem to contribute to the plantar ulcer.  Steep increased calcaneal inclination angle noted with calcifications along the Achilles tendon mid substance both alluding to history of Achilles compromise or possible Achilles tendon rupture  MR FOOT RIGHT WO CONTRAST 12/07/2019: Achilles: Thickening of the distal Achilles tendon measuring 16 mm in AP dimension with intermediate intrasubstance signal and a moderate partial-thickness insertional tear which measures 2.2 cm in transverse dimension (series 5, image 15; series 8, images 8-11). There are multiple ossifications within the distal tendon. No full-thickness Achilles tendon tear. No retrocalcaneal bursitis or peritendinous edema.  IMPRESSION: 1. Negative for acute osteomyelitis. 2. Soft tissue ulceration and cellulitis of the right heel underlying the posterior calcaneus without discrete sinus tract or fluid collection extending to the underlying cortex. No abnormal marrow signal within the calcaneus. 3. Distal Achilles tendinosis with moderate partial-thickness insertional tear. 4. Distal tibialis posterior tendinosis. 5. Findings suggesting chronic plantar fasciitis.  Assessment: 1.  Ulcer plantar heel right 2.  Achilles compromise right with this foot type   Plan of Care:  -Patient evaluated.  Medically necessary  excisional debridement including subcutaneous tissue was performed using a tissue nipper.  Excisional debridement of the necrotic nonviable tissue down to healthier bleeding viable tissue was performed with postdebridement measurement same as pre-. -Continue daily dressing changes at home -Continue custom molded insoles with heel offloaded.  Additional quarter inch felt 'horseshoe' type padding was applied to the heel to add additional offloading to the central portion of the heel -Advised against going barefoot -Nails were debrided by Dr. Donzetta Matters on 02/16/2023. -Return to clinic 4 weeks     Felecia Shelling, DPM [facility]  Dr. Felecia Shelling, DPM    [address]  [place], [place] [address]                Office [phone]  Fax [phone]

## 2023-04-14 ENCOUNTER — Ambulatory Visit (INDEPENDENT_AMBULATORY_CARE_PROVIDER_SITE_OTHER): Payer: Medicare Other | Admitting: Podiatry

## 2023-04-14 DIAGNOSIS — L97412 Non-pressure chronic ulcer of right heel and midfoot with fat layer exposed: Secondary | ICD-10-CM | POA: Diagnosis not present

## 2023-04-14 NOTE — Progress Notes (Signed)
No chief complaint on file.   HPI: 89 y.o. male presenting today for follow-up evaluation of a nonhealing ulcer to the plantar aspect of the right heel.  Patient states that he has had a heel ulcer for about 3.5  years now being treated and managed at [facility] clinic podiatry.  Overall there has been significant improvement and he is very satisfied  Past Medical History:  Diagnosis Date   Bone spur 1988   left heel   Bursitis 1987   right shoulder   Cellulitis 1986   right leg   Pneumonia    noted 07/17/17 CXR    Shingles 1959   Urinary retention    UTI (urinary tract infection)    sepsis E coli UTI +urine and blood cultures 07/17/17     Past Surgical History:  Procedure Laterality Date   APPENDECTOMY  1942   CHOLECYSTECTOMY  1966   EYE SURGERY  2015   catarcts extracted   IRRIGATION AND DEBRIDEMENT FOOT Right 12/09/2019   Procedure: IRRIGATION AND DEBRIDEMENT HEEL;  Surgeon: Rosetta Posner, DPM;  Location: [facility] ORS;  Service: Podiatry;  Laterality: Right;   JOINT REPLACEMENT     right hip 20 + years from 2019, left hip replaced 05/01/17    MELANOMA EXCISION  04.2010   mole removal right arm   TONSILLECTOMY AND ADENOIDECTOMY  1944   TOTAL HIP ARTHROPLASTY  1999   right hip    Allergies  Allergen Reactions   Codeine Other (See Comments)    Alters mental state     RT heel 02/17/2023  Physical Exam: General: The patient is alert and oriented x3 in no acute distress.  Dermatology: Continued improvement.  Ulcer noted right plantar heel has healed there is no open wound today.  With debridement of the surrounding callus tissue there is no open wound and no drainage.  A very pinhead size eschar/scab to the middle portion of the heel but there is no open wound. Vascular: Palpable pedal pulses bilaterally. Capillary refill within normal limits.  Negative for any significant edema or erythema.  Clinically there is no concern for vascular compromise  VAS [place] ABI W/WO TBI  06/24/2022 ABI Findings:  +---------+------------------+-----+---------+--------+  Right   Rt Pressure (mmHg)IndexWaveform Comment   +---------+------------------+-----+---------+--------+  Brachial 141                                       +---------+------------------+-----+---------+--------+  PTA     239               1.70 triphasic          +---------+------------------+-----+---------+--------+  DP      227               1.61 triphasic          +---------+------------------+-----+---------+--------+  Great Toe169               1.20 Normal             +---------+------------------+-----+---------+--------+   +---------+------------------+-----+---------+-------+  Left    Lt Pressure (mmHg)IndexWaveform Comment  +---------+------------------+-----+---------+-------+  Brachial 140                                      +---------+------------------+-----+---------+-------+  PTA     255  1.81 triphasic         +---------+------------------+-----+---------+-------+  DP      236               1.67 triphasic         +---------+------------------+-----+---------+-------+  Randie Heinz Toe191               1.35 Normal            +---------+------------------+-----+---------+-------+   +-------+----------------+-----------+----------------+------------+  ABI/TBIToday's ABI     Today's TBIPrevious ABI    Previous TBI  +-------+----------------+-----------+----------------+------------+  Right Non compressible1.20       Non compressible0.77          +-------+----------------+-----------+----------------+------------+  Left  Non compressible1.35       Non compressible0.88          +-------+----------------+-----------+----------------+------------+  Bilateral ABIs appear essentially unchanged compared to prior study on  06/25/2021.    Summary:  Right: Resting right ankle-brachial index indicates  noncompressible right  lower extremity arteries. The right toe-brachial index is normal.   Left: Resting left ankle-brachial index is within normal range. The left  toe-brachial index is normal.    Neurological: Light touch and protective threshold grossly intact  Musculoskeletal Exam: Cavus foot type noted with increased dorsiflexion of the ankle joint.  Compromised plantarflexion against resistance compared to the contralateral limb  Radiographic Exam RT foot 10/28/2022:  Normal osseous mineralization.  No osseous erosions noted.  Plantar heel spur noted which does not seem to contribute to the plantar ulcer.  Steep increased calcaneal inclination angle noted with calcifications along the Achilles tendon mid substance both alluding to history of Achilles compromise or possible Achilles tendon rupture  MR FOOT RIGHT WO CONTRAST 12/07/2019: Achilles: Thickening of the distal Achilles tendon measuring 16 mm in AP dimension with intermediate intrasubstance signal and a moderate partial-thickness insertional tear which measures 2.2 cm in transverse dimension (series 5, image 15; series 8, images 8-11). There are multiple ossifications within the distal tendon. No full-thickness Achilles tendon tear. No retrocalcaneal bursitis or peritendinous edema.  IMPRESSION: 1. Negative for acute osteomyelitis. 2. Soft tissue ulceration and cellulitis of the right heel underlying the posterior calcaneus without discrete sinus tract or fluid collection extending to the underlying cortex. No abnormal marrow signal within the calcaneus. 3. Distal Achilles tendinosis with moderate partial-thickness insertional tear. 4. Distal tibialis posterior tendinosis. 5. Findings suggesting chronic plantar fasciitis.  Assessment: 1.  Ulcer plantar heel right 2.  Achilles compromise right with this foot type   Plan of Care:  -Patient evaluated.   -Light debridement of the callus tissue around the area was  performed today. -Discontinue all dressings at home with exception of a offloading circular felt pad -Continue custom molded insoles with heel offloaded.  Additional quarter inch felt 'horseshoe' type padding was applied to the heel to add additional offloading to the central portion of the heel -Advised against going barefoot -Nails were debrided by Dr. Donzetta Matters on 02/16/2023. - Return to clinic with me 2 months.     Felecia Shelling, DPM [facility]  Dr. Felecia Shelling, DPM    [address] [place] [address]                Office [phone])  [phone]  Fax [phone]

## 2023-05-06 ENCOUNTER — Encounter: Payer: Self-pay | Admitting: Emergency Medicine

## 2023-05-07 ENCOUNTER — Ambulatory Visit (INDEPENDENT_AMBULATORY_CARE_PROVIDER_SITE_OTHER): Payer: Medicare Other | Admitting: Emergency Medicine

## 2023-05-07 VITALS — Ht 74.0 in | Wt 264.0 lb

## 2023-05-07 DIAGNOSIS — Z0000 Encounter for general adult medical examination without abnormal findings: Secondary | ICD-10-CM | POA: Diagnosis not present

## 2023-05-07 NOTE — Patient Instructions (Addendum)
Terry Vaughn , Thank you for taking time to come for your Medicare Wellness Visit. I appreciate your ongoing commitment to your health goals. Please review the following plan we discussed and let me know if I can assist you in the future.   Referrals/Orders/Follow-Ups/Clinician Recommendations: Get the flu, tetanus and shingles vaccines at your earliest convenience.  This is a list of the screening recommended for you and due dates:  Health Maintenance  Topic Date Due   Zoster (Shingles) Vaccine (1 of 2) 10/26/1984   DTaP/Tdap/Td vaccine (3 - Td or Tdap) 12/26/2020   COVID-19 Vaccine (4 - 2023-24 season) 03/29/2023   Flu Shot  10/26/2023*   Medicare Annual Wellness Visit  05/06/2024   Pneumonia Vaccine  Completed   HPV Vaccine  Aged Out  *Topic was postponed. The date shown is not the original due date.    Advanced directives: (In Chart) A copy of your advanced directives are scanned into your chart should your provider ever need it. ,  Next Medicare Annual Wellness Visit scheduled for next year: Yes, 05/11/24 @ 11:15am   Fall Prevention in the Home, Adult Falls can cause injuries and affect people of all ages. There are many simple things that you can do to make your home safe and to help prevent falls. If you need it, ask for help making these changes. What actions can I take to prevent falls? General information Use good lighting in all rooms. Make sure to: Replace any light bulbs that burn out. Turn on lights if it is dark and use night-lights. Keep items that you use often in easy-to-reach places. Lower the shelves around your home if needed. Move furniture so that there are clear paths around it. Do not keep throw rugs or other things on the floor that can make you trip. If any of your floors are uneven, fix them. Add color or contrast paint or tape to clearly mark and help you see: Grab bars or handrails. First and last steps of staircases. Where the edge of each step  is. If you use a ladder or stepladder: Make sure that it is fully opened. Do not climb a closed ladder. Make sure the sides of the ladder are locked in place. Have someone hold the ladder while you use it. Know where your pets are as you move through your home. What can I do in the bathroom?     Keep the floor dry. Clean up any water that is on the floor right away. Remove soap buildup in the bathtub or shower. Buildup makes bathtubs and showers slippery. Use non-skid mats or decals on the floor of the bathtub or shower. Attach bath mats securely with double-sided, non-slip rug tape. If you need to sit down while you are in the shower, use a non-slip stool. Install grab bars by the toilet and in the bathtub and shower. Do not use towel bars as grab bars. What can I do in the bedroom? Make sure that you have a light by your bed that is easy to reach. Do not use any sheets or blankets on your bed that hang to the floor. Have a firm bench or chair with side arms that you can use for support when you get dressed. What can I do in the kitchen? Clean up any spills right away. If you need to reach something above you, use a sturdy step stool that has a grab bar. Keep electrical cables out of the way. Do not use floor  polish or wax that makes floors slippery. What can I do with my stairs? Do not leave anything on the stairs. Make sure that you have a light switch at the top and the bottom of the stairs. Have them installed if you do not have them. Make sure that there are handrails on both sides of the stairs. Fix handrails that are broken or loose. Make sure that handrails are as long as the staircases. Install non-slip stair treads on all stairs in your home if they do not have carpet. Avoid having throw rugs at the top or bottom of stairs, or secure the rugs with carpet tape to prevent them from moving. Choose a carpet design that does not hide the edge of steps on the stairs. Make sure  that carpet is firmly attached to the stairs. Fix any carpet that is loose or worn. What can I do on the outside of my home? Use bright outdoor lighting. Repair the edges of walkways and driveways and fix any cracks. Clear paths of anything that can make you trip, such as tools or rocks. Add color or contrast paint or tape to clearly mark and help you see high doorway thresholds. Trim any bushes or trees on the main path into your home. Check that handrails are securely fastened and in good repair. Both sides of all steps should have handrails. Install guardrails along the edges of any raised decks or porches. Have leaves, snow, and ice cleared regularly. Use sand, salt, or ice melt on walkways during winter months if you live where there is ice and snow. In the garage, clean up any spills right away, including grease or oil spills. What other actions can I take? Review your medicines with your health care provider. Some medicines can make you confused or feel dizzy. This can increase your chance of falling. Wear closed-toe shoes that fit well and support your feet. Wear shoes that have rubber soles and low heels. Use a cane, walker, scooter, or crutches that help you move around if needed. Talk with your provider about other ways that you can decrease your risk of falls. This may include seeing a physical therapist to learn to do exercises to improve movement and strength. Where to find more information Centers for Disease Control and Prevention, STEADI: TonerPromos.no [employer] on Aging: BaseRingTones.pl National Institute on Aging: BaseRingTones.pl Contact a health care provider if: You are afraid of falling at home. You feel weak, drowsy, or dizzy at home. You fall at home. Get help right away if you: Lose consciousness or have trouble moving after a fall. Have a fall that causes a head injury. These symptoms may be an emergency. Get help right away. Call 911. Do not wait to see if the  symptoms will go away. Do not drive yourself to the hospital. This information is not intended to replace advice given to you by your health care provider. Make sure you discuss any questions you have with your health care provider. Document Revised: 03/17/2022 Document Reviewed: 03/17/2022 Elsevier Patient Education  2024 [employer].

## 2023-05-07 NOTE — Progress Notes (Signed)
Subjective:   Terry Vaughn is a 89 y.o. male who presents for Medicare Annual/Subsequent preventive examination.  Visit Complete: Virtual I connected with  [name] on 05/07/23 by a audio enabled telemedicine application and verified that I am speaking with the correct person using two identifiers.  Patient Location: Home  Provider Location: Home Office  I discussed the limitations of evaluation and management by telemedicine. The patient expressed understanding and agreed to proceed.  Vital Signs: Because this visit was a virtual/telehealth visit, some criteria may be missing or patient reported. Any vitals not documented were not able to be obtained and vitals that have been documented are patient reported.   Cardiac Risk Factors include: advanced age (>41men, >69 women);male gender;hypertension;obesity (BMI >30kg/m2)     Objective:    Today's Vitals   05/07/23 1329  Weight: 264 lb (119.7 kg)  Height: 6\' 2"  (1.88 m)   Body mass index is 33.9 kg/m.     05/07/2023    1:47 PM 12/29/2022   12:16 PM 05/02/2022    1:06 PM 04/24/2021   11:25 AM 04/23/2020   11:13 AM 12/09/2019   11:09 AM 12/08/2019    2:19 AM  Advanced Directives  Does Patient Have a Medical Advance Directive? Yes Yes Yes Yes Yes Yes Yes  Type of Estate agent of [place];Living will  Healthcare Power of [place];Living will Healthcare Power of [place];Living will Healthcare Power of [place];Living will Healthcare Power of [place];Living will Healthcare Power of [place];Living will  Does patient want to make changes to medical advance directive? No - Patient declined  No - Patient declined No - Patient declined No - Patient declined No - Patient declined No - Patient declined  Copy of Healthcare Power of Attorney in Chart? Yes - validated most recent copy scanned in chart (See row information)  Yes - validated most recent copy scanned in chart (See row information) Yes - validated most  recent copy scanned in chart (See row information) Yes - validated most recent copy scanned in chart (See row information) No - copy requested Yes - validated most recent copy scanned in chart (See row information)  Would patient like information on creating a medical advance directive?       No - Patient declined    Current Medications (verified) Outpatient Encounter Medications as of 05/07/2023  Medication Sig   docusate sodium (COLACE) 100 MG capsule Take 100 mg by mouth daily as needed for mild constipation.   finasteride (PROSCAR) 5 MG tablet Take 1 tablet (5 mg total) by mouth daily.   losartan (COZAAR) 25 MG tablet    tamsulosin (FLOMAX) 0.4 MG CAPS capsule Take 1 capsule (0.4 mg total) by mouth daily.   No facility-administered encounter medications on file as of 05/07/2023.    Allergies (verified) Codeine   History: Past Medical History:  Diagnosis Date   Bone spur 1988   left heel   Bursitis 1987   right shoulder   Cellulitis 1986   right leg   Pneumonia    noted 07/17/17 CXR    Shingles 1959   Urinary retention    UTI (urinary tract infection)    sepsis E coli UTI +urine and blood cultures 07/17/17    Past Surgical History:  Procedure Laterality Date   APPENDECTOMY  1942   CHOLECYSTECTOMY  1966   EYE SURGERY  2015   catarcts extracted   IRRIGATION AND DEBRIDEMENT FOOT Right 12/09/2019   Procedure: IRRIGATION AND DEBRIDEMENT HEEL;  Surgeon: Rosetta Posner, DPM;  Location: [facility] ORS;  Service: Podiatry;  Laterality: Right;   JOINT REPLACEMENT     right hip 20 + years from 2019, left hip replaced 05/01/17    MELANOMA EXCISION  04.2010   mole removal right arm   TONSILLECTOMY AND ADENOIDECTOMY  1944   TOTAL HIP ARTHROPLASTY  1999   right hip   Family History  Problem Relation Age of Onset   Clotting disorder Mother    Heart disease Father 70   Cancer Sister        breast   Cerebral aneurysm Brother        [place]   Cancer Daughter        breast    Social History   Socioeconomic History   Marital status: Married    Spouse name: Garlan Fair   Number of children: 3   Years of education: 16   Highest education level: Some college, no degree  Occupational History   Occupation: retired  Tobacco Use   Smoking status: Never   Smokeless tobacco: Never  Vaping Use   Vaping status: Never Used  Substance and Sexual Activity   Alcohol use: No    Alcohol/week: 0.0 standard drinks of alcohol   Drug use: No   Sexual activity: Not Currently  Other Topics Concern   Not on file  Social History Narrative   Lives in [place] with wife.   Diet - regular   Exercise - none   Social Determinants of Health   Financial Resource Strain: Low Risk  (05/07/2023)   Overall Financial Resource Strain (CARDIA)    Difficulty of Paying Living Expenses: Not hard at all  Food Insecurity: No Food Insecurity (05/07/2023)   Hunger Vital Sign    Worried About Running Out of Food in the Last Year: Never true    Ran Out of Food in the Last Year: Never true  Transportation Needs: No Transportation Needs (05/07/2023)   PRAPARE - Administrator, Civil Service (Medical): No    Lack of Transportation (Non-Medical): No  Physical Activity: Inactive (05/07/2023)   Exercise Vital Sign    Days of Exercise per Week: 0 days    Minutes of Exercise per Session: 0 min  Stress: No Stress Concern Present (05/07/2023)   [employer] of Occupational Health - Occupational Stress Questionnaire    Feeling of Stress : Not at all  Social Connections: Moderately Isolated (05/07/2023)   Social Connection and Isolation Panel [NHANES]    Frequency of Communication with Friends and Family: Three times a week    Frequency of Social Gatherings with Friends and Family: More than three times a week    Attends Religious Services: Never    Database administrator or Organizations: No    Attends Engineer, structural: Never    Marital Status: Married     Tobacco Counseling Counseling given: Not Answered   Clinical Intake:  Pre-visit preparation completed: Yes  Pain : No/denies pain     BMI - recorded: 33.9 Nutritional Status: BMI > 30  Obese Nutritional Risks: None Diabetes: No  How often do you need to have someone help you when you read instructions, pamphlets, or other written materials from your doctor or pharmacy?: 1 - Never  Interpreter Needed?: No  Information entered by :: Tora Kindred, CMA   Activities of Daily Living    05/07/2023    1:33 PM  In your present state of health, do you have any  difficulty performing the following activities:  Hearing? 0  Vision? 0  Difficulty concentrating or making decisions? 0  Walking or climbing stairs? 1  Comment uses a walker  Dressing or bathing? 0  Doing errands, shopping? 1  Comment doesn't drive, son drives patient to appointments  Preparing Food and eating ? N  Using the Toilet? N  In the past six months, have you accidently leaked urine? N  Do you have problems with loss of bowel control? N  Managing your Medications? N  Managing your Finances? N  Housekeeping or managing your Housekeeping? N    Patient Care Team: Allegra Grana, FNP as PCP - General (Family Medicine) Felecia Shelling, DPM as Consulting Physician (Podiatry) Lockie Mola, MD as Referring Physician (Ophthalmology)  Indicate any recent Medical Services you may have received from other than [facility] providers in the past year (date may be approximate).     Assessment:   This is a routine wellness examination for [place].  Hearing/Vision screen Hearing Screening - Comments:: Slight hearing loss, does not wear hearing aids Vision Screening - Comments:: Gets routine eye exams   Goals Addressed               This Visit's Progress     DIET - INCREASE WATER INTAKE (pt-stated)        Depression Screen    05/07/2023    1:46 PM 05/23/2022    3:13 PM 05/02/2022    1:05 PM  04/24/2021   11:29 AM 04/23/2020   11:14 AM 01/24/2020    8:30 AM 04/21/2019   11:20 AM  PHQ 2/9 Scores  PHQ - 2 Score 0 0 0 0 0 0 0  PHQ- 9 Score 0 0         Fall Risk    05/07/2023    1:49 PM 05/23/2022    3:13 PM 05/02/2022    1:06 PM 04/24/2021   11:27 AM 04/23/2020   11:14 AM  Fall Risk   Falls in the past year? 0 0 0 0 0  Number falls in past yr: 0 0 0  0  Injury with Fall? 0 0 0    Risk for fall due to : Impaired mobility;Impaired balance/gait No Fall Risks Impaired balance/gait    Risk for fall due to: Comment   walker in use when ambulating    Follow up Falls prevention discussed;Education provided;Falls evaluation completed Falls evaluation completed Falls evaluation completed Falls evaluation completed Falls evaluation completed    MEDICARE RISK AT HOME: Medicare Risk at Home Any stairs in or around the home?: Yes If so, are there any without handrails?: No Home free of loose throw rugs in walkways, pet beds, electrical cords, etc?: Yes Adequate lighting in your home to reduce risk of falls?: Yes Life alert?: No Use of a cane, walker or w/c?: Yes (walker) Grab bars in the bathroom?: Yes Shower chair or bench in shower?: Yes Elevated toilet seat or a handicapped toilet?: Yes  TIMED UP AND GO:  Was the test performed?  No    Cognitive Function:    11/28/2015    1:32 PM  MMSE - Mini Mental State Exam  Orientation to time 5  Orientation to Place 5  Registration 3  Attention/ Calculation 5  Recall 3  Language- name 2 objects 2  Language- repeat 1  Language- follow 3 step command 3  Language- read & follow direction 1  Write a sentence 1  Copy design 1  Total  score 30        05/07/2023    1:51 PM 05/02/2022    1:19 PM 04/24/2021   11:32 AM 04/23/2020   11:21 AM 04/21/2019   11:29 AM  6CIT Screen  What Year? 0 points 0 points 0 points 0 points 0 points  What month? 0 points 0 points 0 points 0 points 0 points  What time? 0 points 0 points 0 points 0  points 0 points  Count back from 20 0 points 0 points 0 points  0 points  Months in reverse 0 points 0 points 0 points  0 points  Repeat phrase 0 points 0 points 0 points  0 points  Total Score 0 points 0 points 0 points  0 points    Immunizations Immunization History  Administered Date(s) Administered   Fluad Quad(high Dose 65+) 05/23/2022   Hepatitis B 07/06/1992   Influenza Nasal 03/29/2011   Influenza Split 05/02/2012, 05/11/2014   Influenza Whole 05/28/2008   Influenza, High Dose Seasonal PF 04/15/2017, 05/31/2018, 04/28/2019   Influenza-Unspecified 05/06/2016, 06/28/2020, 05/18/2021   PFIZER(Purple Top)SARS-COV-2 Vaccination 08/12/2019, 09/02/2019   Pfizer Covid-19 Vaccine Bivalent Booster 5y-11y 05/18/2021   Pneumococcal Conjugate-13 03/24/2014   Pneumococcal Polysaccharide-23 05/28/2000   Td 05/28/2008   Tdap 12/27/2010   Zoster, Live 05/29/2007    TDAP status: Due, Education has been provided regarding the importance of this vaccine. Advised may receive this vaccine at local pharmacy or Health Dept. Aware to provide a copy of the vaccination record if obtained from local pharmacy or Health Dept. Verbalized acceptance and understanding.  Flu Vaccine status: Due, Education has been provided regarding the importance of this vaccine. Advised may receive this vaccine at local pharmacy or Health Dept. Aware to provide a copy of the vaccination record if obtained from local pharmacy or Health Dept. Verbalized acceptance and understanding.  Pneumococcal vaccine status: Up to date  Covid-19 vaccine status: Information provided on how to obtain vaccines.   Qualifies for Shingles Vaccine? Yes   Zostavax completed Yes   Shingrix Completed?: No.    Education has been provided regarding the importance of this vaccine. Patient has been advised to call insurance company to determine out of pocket expense if they have not yet received this vaccine. Advised may also receive vaccine at  local pharmacy or Health Dept. Verbalized acceptance and understanding.  Screening Tests Health Maintenance  Topic Date Due   Zoster Vaccines- Shingrix (1 of 2) 10/26/1984   DTaP/Tdap/Td (3 - Td or Tdap) 12/26/2020   INFLUENZA VACCINE  02/26/2023   COVID-19 Vaccine (4 - 2023-24 season) 03/29/2023   Medicare Annual Wellness (AWV)  05/06/2024   Pneumonia Vaccine 52+ Years old  Completed   HPV VACCINES  Aged Out    Health Maintenance  Health Maintenance Due  Topic Date Due   Zoster Vaccines- Shingrix (1 of 2) 10/26/1984   DTaP/Tdap/Td (3 - Td or Tdap) 12/26/2020   INFLUENZA VACCINE  02/26/2023   COVID-19 Vaccine (4 - 2023-24 season) 03/29/2023    Colorectal cancer screening: No longer required.   Lung Cancer Screening: (Low Dose CT Chest recommended if Age 18-80 years, 20 pack-year currently smoking OR have quit w/in 15years.) does not qualify.   Lung Cancer Screening Referral: n/a  Additional Screening:  Hepatitis C Screening: does not qualify;   Vision Screening: Recommended annual ophthalmology exams for early detection of glaucoma and other disorders of the eye. Dental Screening: Recommended annual dental exams for proper oral hygiene  Community Resource Referral / Chronic Care Management: CRR required this visit?  No   CCM required this visit?  No     Plan:     I have personally reviewed and noted the following in the patient's chart:   Medical and social history Use of alcohol, tobacco or illicit drugs  Current medications and supplements including opioid prescriptions. Patient is not currently taking opioid prescriptions. Functional ability and status Nutritional status Physical activity Advanced directives List of other physicians Hospitalizations, surgeries, and ER visits in previous 12 months Vitals Screenings to include cognitive, depression, and falls Referrals and appointments  In addition, I have reviewed and discussed with patient certain  preventive protocols, quality metrics, and best practice recommendations. A written personalized care plan for preventive services as well as general preventive health recommendations were provided to patient.     Tora Kindred, CMA   05/07/2023   After Visit Summary: (Pick Up) Due to this being a telephonic visit, with patients personalized plan was offered to patient and patient has requested to Pick up at office.  Nurse Notes:  Needs Tdap, shingles and flu vaccines.

## 2023-05-21 ENCOUNTER — Encounter: Payer: Self-pay | Admitting: Podiatry

## 2023-05-21 ENCOUNTER — Ambulatory Visit (INDEPENDENT_AMBULATORY_CARE_PROVIDER_SITE_OTHER): Payer: Medicare Other | Admitting: Podiatry

## 2023-05-21 DIAGNOSIS — B351 Tinea unguium: Secondary | ICD-10-CM

## 2023-05-21 DIAGNOSIS — M79676 Pain in unspecified toe(s): Secondary | ICD-10-CM | POA: Diagnosis not present

## 2023-05-21 DIAGNOSIS — I739 Peripheral vascular disease, unspecified: Secondary | ICD-10-CM

## 2023-05-22 ENCOUNTER — Ambulatory Visit: Payer: Medicare Other | Admitting: Podiatry

## 2023-05-27 ENCOUNTER — Encounter: Payer: Medicare Other | Admitting: Family

## 2023-05-28 ENCOUNTER — Ambulatory Visit: Payer: Medicare Other | Admitting: Family

## 2023-05-28 ENCOUNTER — Encounter: Payer: Self-pay | Admitting: Family

## 2023-05-28 VITALS — BP 118/78 | HR 80 | Temp 97.9°F | Ht 73.0 in | Wt 266.8 lb

## 2023-05-28 DIAGNOSIS — I10 Essential (primary) hypertension: Secondary | ICD-10-CM

## 2023-05-28 DIAGNOSIS — Z0000 Encounter for general adult medical examination without abnormal findings: Secondary | ICD-10-CM | POA: Diagnosis not present

## 2023-05-28 DIAGNOSIS — Z23 Encounter for immunization: Secondary | ICD-10-CM

## 2023-05-28 NOTE — Progress Notes (Signed)
Subjective:  Patient ID: Terry Vaughn, male    DOB: 25-Mar-1933,  MRN: [id number]  Terry Vaughn presents to clinic today for: at risk foot care. Patient has h/o PAD and painful thick toenails that are difficult to trim. Pain interferes with ambulation. Aggravating factors include wearing enclosed shoe gear. Pain is relieved with periodic professional debridement.   Patient is accompanied by his son on today's visit. He still sees Dr. Logan Bores for ulcer of right heel. States it is improving.  PCP is [name], Lyn Records, FNP.  Allergies  Allergen Reactions   Codeine Other (See Comments)    Alters mental state    Review of Systems: Negative except as noted in the HPI.  Objective: No changes noted in today's physical examination. There were no vitals filed for this visit.  Terry Vaughn is a pleasant 89 y.o. male in NAD. AAO x 3.  Vascular Examination: Capillary refill time <3 seconds b/l LE. Palpable pedal pulses b/l LE. Digital hair absent b/l. No pedal edema b/l. Skin temperature gradient WNL b/l. No ischemia or gangrene noted b/l LE. No cyanosis or clubbing noted b/l LE.Marland Kitchen  Dermatological Examination: Dressing right heel clean, dry and intact. Pedal skin with normal turgor, texture and tone b/l. No open wounds. No interdigital macerations b/l. Toenails 1-5 b/l thickened, discolored, dystrophic with subungual debris. There is pain on palpation to dorsal aspect of nailplates. No hyperkeratotic nor porokeratotic lesions present on today's visit.Marland Kitchen  Neurological Examination: Protective sensation intact with 10 gram monofilament b/l LE. Vibratory sensation intact b/l LE.   Musculoskeletal Examination: Muscle strength 5/5 to all lower extremity muscle groups bilaterally. No pain, crepitus or joint limitation noted with ROM bilateral LE.  Assessment/Plan: 1. Pain due to onychomycosis of toenail   2. PVD (peripheral vascular disease) (HCC)     -Patient's family member present. All  questions/concerns addressed on today's visit. -He is seeing Dr. Logan Bores for management of right heel ulcer. -Continue supportive shoe gear daily. -Mycotic toenails 1-5 bilaterally were debrided in length and girth with sterile nail nippers and dremel without incident. -Patient/POA to call should there be question/concern in the interim.   Return in about 3 months (around 08/21/2023).  Freddie Breech, DPM

## 2023-05-28 NOTE — Patient Instructions (Signed)
Health Maintenance, Male Adopting a healthy lifestyle and getting preventive care are important in promoting health and wellness. Ask your health care provider about: The right schedule for you to have regular tests and exams. Things you can do on your own to prevent diseases and keep yourself healthy. What should I know about diet, weight, and exercise? Eat a healthy diet  Eat a diet that includes plenty of vegetables, fruits, low-fat dairy products, and lean protein. Do not eat a lot of foods that are high in solid fats, added sugars, or sodium. Maintain a healthy weight Body mass index (BMI) is a measurement that can be used to identify possible weight problems. It estimates body fat based on height and weight. Your health care provider can help determine your BMI and help you achieve or maintain a healthy weight. Get regular exercise Get regular exercise. This is one of the most important things you can do for your health. Most adults should: Exercise for at least 150 minutes each week. The exercise should increase your heart rate and make you sweat (moderate-intensity exercise). Do strengthening exercises at least twice a week. This is in addition to the moderate-intensity exercise. Spend less time sitting. Even light physical activity can be beneficial. Watch cholesterol and blood lipids Have your blood tested for lipids and cholesterol at 87 years of age, then have this test every 5 years. You may need to have your cholesterol levels checked more often if: Your lipid or cholesterol levels are high. You are older than 87 years of age. You are at high risk for heart disease. What should I know about cancer screening? Many types of cancers can be detected early and may often be prevented. Depending on your health history and family history, you may need to have cancer screening at various ages. This may include screening for: Colorectal cancer. Prostate cancer. Skin cancer. Lung  cancer. What should I know about heart disease, diabetes, and high blood pressure? Blood pressure and heart disease High blood pressure causes heart disease and increases the risk of stroke. This is more likely to develop in people who have high blood pressure readings or are overweight. Talk with your health care provider about your target blood pressure readings. Have your blood pressure checked: Every 3-5 years if you are 18-39 years of age. Every year if you are 40 years old or older. If you are between the ages of 65 and 75 and are a current or former smoker, ask your health care provider if you should have a one-time screening for abdominal aortic aneurysm (AAA). Diabetes Have regular diabetes screenings. This checks your fasting blood sugar level. Have the screening done: Once every three years after age 45 if you are at a normal weight and have a low risk for diabetes. More often and at a younger age if you are overweight or have a high risk for diabetes. What should I know about preventing infection? Hepatitis B If you have a higher risk for hepatitis B, you should be screened for this virus. Talk with your health care provider to find out if you are at risk for hepatitis B infection. Hepatitis C Blood testing is recommended for: Everyone born from 1945 through 1965. Anyone with known risk factors for hepatitis C. Sexually transmitted infections (STIs) You should be screened each year for STIs, including gonorrhea and chlamydia, if: You are sexually active and are younger than 87 years of age. You are older than 87 years of age and your   health care provider tells you that you are at risk for this type of infection. Your sexual activity has changed since you were last screened, and you are at increased risk for chlamydia or gonorrhea. Ask your health care provider if you are at risk. Ask your health care provider about whether you are at high risk for HIV. Your health care provider  may recommend a prescription medicine to help prevent HIV infection. If you choose to take medicine to prevent HIV, you should first get tested for HIV. You should then be tested every 3 months for as long as you are taking the medicine. Follow these instructions at home: Alcohol use Do not drink alcohol if your health care provider tells you not to drink. If you drink alcohol: Limit how much you have to 0-2 drinks a day. Know how much alcohol is in your drink. In the [place] one drink equals one 12 oz bottle of beer (355 mL), one 5 oz glass of wine (148 mL), or one 1 oz glass of hard liquor (44 mL). Lifestyle Do not use any products that contain nicotine or tobacco. These products include cigarettes, chewing tobacco, and vaping devices, such as e-cigarettes. If you need help quitting, ask your health care provider. Do not use street drugs. Do not share needles. Ask your health care provider for help if you need support or information about quitting drugs. General instructions Schedule regular health, dental, and eye exams. Stay current with your vaccines. Tell your health care provider if: You often feel depressed. You have ever been abused or do not feel safe at home. Summary Adopting a healthy lifestyle and getting preventive care are important in promoting health and wellness. Follow your health care provider's instructions about healthy diet, exercising, and getting tested or screened for diseases. Follow your health care provider's instructions on monitoring your cholesterol and blood pressure. This information is not intended to replace advice given to you by your health care provider. Make sure you discuss any questions you have with your health care provider. Document Revised: 12/03/2020 Document Reviewed: 12/03/2020 Elsevier Patient Education  2024 Elsevier Inc.  

## 2023-06-01 NOTE — Assessment & Plan Note (Signed)
Excellent control. Continue losartan 25mg 

## 2023-06-01 NOTE — Assessment & Plan Note (Signed)
Patient exercises limited due to heel ulcer.  Encouraged Shingrix and COVID-vaccine at local pharmacy.

## 2023-06-16 ENCOUNTER — Encounter: Payer: Self-pay | Admitting: Podiatry

## 2023-06-16 ENCOUNTER — Ambulatory Visit (INDEPENDENT_AMBULATORY_CARE_PROVIDER_SITE_OTHER): Payer: Medicare Other | Admitting: Podiatry

## 2023-06-16 DIAGNOSIS — L97412 Non-pressure chronic ulcer of right heel and midfoot with fat layer exposed: Secondary | ICD-10-CM | POA: Diagnosis not present

## 2023-06-16 NOTE — Progress Notes (Signed)
Chief Complaint  Patient presents with   Wound Check    "I think it's doing good."    HPI: 89 y.o. male presenting today for follow-up evaluation of a nonhealing ulcer to the plantar aspect of the right heel.  Last visit on 04/14/2023 the wound was essentially healed.  He is doing well.  No new complaints  Brief history: Patient states that he has had a heel ulcer for about 3.5  years treated and managed at [facility] clinic podiatry prior to coming to our practice for treatment and care.  Overall there has been significant improvement and he is very satisfied  Past Medical History:  Diagnosis Date   Bone spur 1988   left heel   Bursitis 1987   right shoulder   Cellulitis 1986   right leg   Pneumonia    noted 07/17/17 CXR    Shingles 1959   Urinary retention    UTI (urinary tract infection)    sepsis E coli UTI +urine and blood cultures 07/17/17     Past Surgical History:  Procedure Laterality Date   APPENDECTOMY  1942   CHOLECYSTECTOMY  1966   EYE SURGERY  2015   catarcts extracted   IRRIGATION AND DEBRIDEMENT FOOT Right 12/09/2019   Procedure: IRRIGATION AND DEBRIDEMENT HEEL;  Surgeon: Rosetta Posner, DPM;  Location: [facility] ORS;  Service: Podiatry;  Laterality: Right;   JOINT REPLACEMENT     right hip 20 + years from 2019, left hip replaced 05/01/17    MELANOMA EXCISION  04.2010   mole removal right arm   TONSILLECTOMY AND ADENOIDECTOMY  1944   TOTAL HIP ARTHROPLASTY  1999   right hip    Allergies  Allergen Reactions   Codeine Other (See Comments)    Alters mental state     RT heel 02/17/2023   RT heel 06/16/2023  Physical Exam: General: The patient is alert and oriented x3 in no acute distress.  Dermatology: Recurrent ulcer noted right plantar heel.  With debridement of the surrounding callus tissue there is recurrence of an ulcer to the plantar aspect of the heel.  Wound measures approximately 0.4 x 0.4 x 0.2 cm.  Granular wound base.  There is no exposed bone  muscle tendon ligament or joint.  No purulence.  Overall it appears stable  Vascular: Palpable pedal pulses bilaterally. Capillary refill within normal limits.  Negative for any significant edema or erythema.  Clinically there is no concern for vascular compromise  VAS [place] ABI W/WO TBI 06/24/2022 ABI Findings:  +---------+------------------+-----+---------+--------+  Right   Rt Pressure (mmHg)IndexWaveform Comment   +---------+------------------+-----+---------+--------+  Brachial 141                                       +---------+------------------+-----+---------+--------+  PTA     239               1.70 triphasic          +---------+------------------+-----+---------+--------+  DP      227               1.61 triphasic          +---------+------------------+-----+---------+--------+  Great Toe169               1.20 Normal             +---------+------------------+-----+---------+--------+   +---------+------------------+-----+---------+-------+  Left    Lt Pressure (mmHg)IndexWaveform Comment  +---------+------------------+-----+---------+-------+  Brachial 140                                      +---------+------------------+-----+---------+-------+  PTA     255               1.81 triphasic         +---------+------------------+-----+---------+-------+  DP      236               1.67 triphasic         +---------+------------------+-----+---------+-------+  Randie Heinz Toe191               1.35 Normal            +---------+------------------+-----+---------+-------+   +-------+----------------+-----------+----------------+------------+  ABI/TBIToday's ABI     Today's TBIPrevious ABI    Previous TBI  +-------+----------------+-----------+----------------+------------+  Right Non compressible1.20       Non compressible0.77          +-------+----------------+-----------+----------------+------------+  Left  Non  compressible1.35       Non compressible0.88          +-------+----------------+-----------+----------------+------------+  Bilateral ABIs appear essentially unchanged compared to prior study on  06/25/2021.    Summary:  Right: Resting right ankle-brachial index indicates noncompressible right  lower extremity arteries. The right toe-brachial index is normal.   Left: Resting left ankle-brachial index is within normal range. The left  toe-brachial index is normal.    Neurological: Light touch and protective threshold grossly intact  Musculoskeletal Exam: Cavus foot type noted with increased dorsiflexion of the ankle joint.  Compromised plantarflexion against resistance compared to the contralateral limb  Radiographic Exam RT foot 10/28/2022:  Normal osseous mineralization.  No osseous erosions noted.  Plantar heel spur noted which does not seem to contribute to the plantar ulcer.  Steep increased calcaneal inclination angle noted with calcifications along the Achilles tendon mid substance both alluding to history of Achilles compromise or possible Achilles tendon rupture  MR FOOT RIGHT WO CONTRAST 12/07/2019: Achilles: Thickening of the distal Achilles tendon measuring 16 mm in AP dimension with intermediate intrasubstance signal and a moderate partial-thickness insertional tear which measures 2.2 cm in transverse dimension (series 5, image 15; series 8, images 8-11). There are multiple ossifications within the distal tendon. No full-thickness Achilles tendon tear. No retrocalcaneal bursitis or peritendinous edema.  IMPRESSION: 1. Negative for acute osteomyelitis. 2. Soft tissue ulceration and cellulitis of the right heel underlying the posterior calcaneus without discrete sinus tract or fluid collection extending to the underlying cortex. No abnormal marrow signal within the calcaneus. 3. Distal Achilles tendinosis with moderate partial-thickness insertional tear. 4. Distal  tibialis posterior tendinosis. 5. Findings suggesting chronic plantar fasciitis.  Assessment: 1.  Ulcer plantar heel right 2.  Achilles compromise right with this foot type   Plan of Care:  -Patient evaluated.   - Medically necessary excisional debridement including subcutaneous tissue was performed today using a tissue nipper.  Excisional debridement of the necrotic nonviable tissue down to healthier bleeding viable tissue was performed with postdebridement measurement same as pre- -Dressings applied.  Resume daily dressing changes at home -Continue custom molded insoles with heel offloaded.  Additional quarter inch felt 'horseshoe' type padding was applied to the heel to add additional offloading to the central portion of the heel -Advised against going barefoot -Nails were debrided by Dr. Donzetta Matters on 05/21/2023. - Return to clinic with me 4 weeks  Felecia Shelling, DPM [facility]  Dr. Felecia Shelling, DPM    [address] [place], [place] [address]                Office [phone]  Fax [phone]

## 2023-06-18 ENCOUNTER — Encounter (INDEPENDENT_AMBULATORY_CARE_PROVIDER_SITE_OTHER): Payer: Self-pay | Admitting: Nurse Practitioner

## 2023-06-18 ENCOUNTER — Ambulatory Visit (INDEPENDENT_AMBULATORY_CARE_PROVIDER_SITE_OTHER): Payer: Medicare Other | Admitting: Nurse Practitioner

## 2023-06-18 ENCOUNTER — Ambulatory Visit (INDEPENDENT_AMBULATORY_CARE_PROVIDER_SITE_OTHER): Payer: Medicare Other

## 2023-06-18 VITALS — BP 150/73 | HR 62 | Resp 16 | Wt 267.0 lb

## 2023-06-18 DIAGNOSIS — I10 Essential (primary) hypertension: Secondary | ICD-10-CM

## 2023-06-18 DIAGNOSIS — L97519 Non-pressure chronic ulcer of other part of right foot with unspecified severity: Secondary | ICD-10-CM

## 2023-06-20 ENCOUNTER — Encounter (INDEPENDENT_AMBULATORY_CARE_PROVIDER_SITE_OTHER): Payer: Self-pay | Admitting: Nurse Practitioner

## 2023-06-20 NOTE — Progress Notes (Signed)
Subjective:    Patient ID: Terry Vaughn, male    DOB: 1933/05/04, 89 y.o.   MRN: [id number] Chief Complaint  Patient presents with   Follow-up    1 yr abi follow up    Patient returns today in follow up of his PAD.  The patient has recently changed podiatrist and change in treatment on his longstanding right foot ulceration.  This has been present for many years and he was initially told that it would likely never heal due to pressure and configuration of the area.  They have been working with the wound and it has actually shown some notable improvement.  He denies any claudication-like symptoms or any new open wounds or ulcerations.  Today the patient continues to have noncompressible ABIs but he still has good TBI's with a TBI 0.91 on the right and 1.06 on the left.  They are essentially unchanged from previous studies.  He has multiphasic waveforms and normal toe waveforms bilaterally.      Review of Systems  Skin:  Positive for wound.  All other systems reviewed and are negative.      Objective:   Physical Exam Vitals reviewed.  HENT:     Head: Normocephalic.  Cardiovascular:     Rate and Rhythm: Normal rate.     Pulses:          Dorsalis pedis pulses are detected w/ Doppler on the right side and detected w/ Doppler on the left side.       Posterior tibial pulses are detected w/ Doppler on the right side and detected w/ Doppler on the left side.  Pulmonary:     Effort: Pulmonary effort is normal.  Skin:    General: Skin is warm and dry.  Neurological:     Mental Status: He is alert and oriented to person, place, and time.  Psychiatric:        Mood and Affect: Mood normal.        Behavior: Behavior normal.        Thought Content: Thought content normal.        Judgment: Judgment normal.     BP (!) 150/73 (BP Location: Left Arm)   Pulse 62   Resp 16   Wt 267 lb (121.1 kg)   BMI 35.23 kg/m   Past Medical History:  Diagnosis Date   Bone spur 1988   left heel    Bursitis 1987   right shoulder   Cellulitis 1986   right leg   Pneumonia    noted 07/17/17 CXR    Shingles 1959   Urinary retention    UTI (urinary tract infection)    sepsis E coli UTI +urine and blood cultures 07/17/17     Social History   Socioeconomic History   Marital status: Married    Spouse name: Public affairs consultant   Number of children: 3   Years of education: 16   Highest education level: Some college, no degree  Occupational History   Occupation: retired  Tobacco Use   Smoking status: Never   Smokeless tobacco: Never  Vaping Use   Vaping status: Never Used  Substance and Sexual Activity   Alcohol use: No    Alcohol/week: 0.0 standard drinks of alcohol   Drug use: No   Sexual activity: Not Currently  Other Topics Concern   Not on file  Social History Narrative   Lives in [place] with wife.   Diet - regular   Exercise - none  Social Determinants of Health   Financial Resource Strain: Low Risk  (05/07/2023)   Overall Financial Resource Strain (CARDIA)    Difficulty of Paying Living Expenses: Not hard at all  Food Insecurity: No Food Insecurity (05/07/2023)   Hunger Vital Sign    Worried About Running Out of Food in the Last Year: Never true    Ran Out of Food in the Last Year: Never true  Transportation Needs: No Transportation Needs (05/07/2023)   PRAPARE - Administrator, Civil Service (Medical): No    Lack of Transportation (Non-Medical): No  Physical Activity: Inactive (05/07/2023)   Exercise Vital Sign    Days of Exercise per Week: 0 days    Minutes of Exercise per Session: 0 min  Stress: No Stress Concern Present (05/07/2023)   [employer] of Occupational Health - Occupational Stress Questionnaire    Feeling of Stress : Not at all  Social Connections: Moderately Isolated (05/07/2023)   Social Connection and Isolation Panel [NHANES]    Frequency of Communication with Friends and Family: Three times a week    Frequency of Social  Gatherings with Friends and Family: More than three times a week    Attends Religious Services: Never    Database administrator or Organizations: No    Attends Banker Meetings: Never    Marital Status: Married  Catering manager Violence: Not At Risk (05/07/2023)   Humiliation, Afraid, Rape, and Kick questionnaire    Fear of Current or Ex-Partner: No    Emotionally Abused: No    Physically Abused: No    Sexually Abused: No    Past Surgical History:  Procedure Laterality Date   APPENDECTOMY  1942   CHOLECYSTECTOMY  1966   EYE SURGERY  2015   catarcts extracted   IRRIGATION AND DEBRIDEMENT FOOT Right 12/09/2019   Procedure: IRRIGATION AND DEBRIDEMENT HEEL;  Surgeon: Rosetta Posner, DPM;  Location: [facility] ORS;  Service: Podiatry;  Laterality: Right;   JOINT REPLACEMENT     right hip 20 + years from 2019, left hip replaced 05/01/17    MELANOMA EXCISION  04.2010   mole removal right arm   TONSILLECTOMY AND ADENOIDECTOMY  1944   TOTAL HIP ARTHROPLASTY  1999   right hip    Family History  Problem Relation Age of Onset   Clotting disorder Mother    Heart disease Father 69   Cancer Sister        breast   Cerebral aneurysm Brother        hemmorage   Cancer Daughter        breast    Allergies  Allergen Reactions   Codeine Other (See Comments)    Alters mental state       Latest Ref Rng & Units 12/29/2022    1:02 PM 12/29/2022   12:18 PM 05/22/2021    3:53 PM  CBC  WBC 4.0 - 10.5 K/uL 8.4  SPECIMEN CLOTTED  7.2   Hemoglobin 13.0 - 17.0 g/dL 57.8  SPECIMEN CLOTTED  14.0   Hematocrit 39.0 - 52.0 % 39.2  SPECIMEN CLOTTED  40.7   Platelets 150 - 400 K/uL 295  SPECIMEN CLOTTED  283.0       CMP     Component Value Date/Time   NA 138 12/29/2022 1218   K 3.7 12/29/2022 1218   CL 106 12/29/2022 1218   CO2 21 (L) 12/29/2022 1218   GLUCOSE 133 (H) 12/29/2022 1218   BUN 13 12/29/2022  1218   CREATININE 1.23 12/29/2022 1218   CREATININE 1.44 (H) 05/23/2022 1538    CALCIUM 8.5 (L) 12/29/2022 1218   PROT 6.7 12/29/2022 1218   ALBUMIN 4.0 12/29/2022 1218   AST 33 12/29/2022 1218   ALT 19 12/29/2022 1218   ALKPHOS 49 12/29/2022 1218   BILITOT 2.4 (H) 12/29/2022 1218   GFR 46.26 (L) 06/13/2021 1442   GFRNONAA 56 (L) 12/29/2022 1218     No results found.     Assessment & Plan:   1. Ulcer of right foot, unspecified ulcer stage (HCC) He will continue to work with podiatry for his ulceration.  At this time he currently has adequate perfusion for wound healing.  Currently no role for intervention.  Will continue with checking annually.  2. Primary hypertension Continue antihypertensive medications as already ordered, these medications have been reviewed and there are no changes at this time.   Current Outpatient Medications on File Prior to Visit  Medication Sig Dispense Refill   diclofenac Sodium (VOLTAREN ARTHRITIS PAIN) 1 % GEL Apply 2 g topically 4 (four) times daily.     docusate sodium (COLACE) 100 MG capsule Take 100 mg by mouth daily as needed for mild constipation.     finasteride (PROSCAR) 5 MG tablet Take 1 tablet (5 mg total) by mouth daily. 90 tablet 3   losartan (COZAAR) 25 MG tablet      tamsulosin (FLOMAX) 0.4 MG CAPS capsule Take 1 capsule (0.4 mg total) by mouth daily. 90 capsule 3   No current facility-administered medications on file prior to visit.    There are no Patient Instructions on file for this visit. No follow-ups on file.   Georgiana Spinner, NP

## 2023-06-23 ENCOUNTER — Other Ambulatory Visit: Payer: Self-pay | Admitting: Urology

## 2023-06-23 DIAGNOSIS — N138 Other obstructive and reflux uropathy: Secondary | ICD-10-CM

## 2023-06-23 LAB — VAS US ABI WITH/WO TBI

## 2023-07-07 ENCOUNTER — Ambulatory Visit: Payer: Medicare Other | Admitting: Urology

## 2023-07-07 VITALS — BP 128/74 | HR 92 | Ht 72.0 in | Wt 270.4 lb

## 2023-07-07 DIAGNOSIS — N401 Enlarged prostate with lower urinary tract symptoms: Secondary | ICD-10-CM | POA: Diagnosis not present

## 2023-07-07 DIAGNOSIS — N138 Other obstructive and reflux uropathy: Secondary | ICD-10-CM

## 2023-07-07 LAB — URINALYSIS, COMPLETE
Bilirubin, UA: NEGATIVE
Glucose, UA: NEGATIVE
Ketones, UA: NEGATIVE
Leukocytes,UA: NEGATIVE
Nitrite, UA: NEGATIVE
Protein,UA: NEGATIVE
RBC, UA: NEGATIVE
Specific Gravity, UA: 1.015 (ref 1.005–1.030)
Urobilinogen, Ur: 0.2 mg/dL (ref 0.2–1.0)
pH, UA: 6 (ref 5.0–7.5)

## 2023-07-07 LAB — MICROSCOPIC EXAMINATION

## 2023-07-07 LAB — BLADDER SCAN AMB NON-IMAGING: Scan Result: 42

## 2023-07-07 MED ORDER — FINASTERIDE 5 MG PO TABS: 5.0000 mg | ORAL_TABLET | Freq: Every day | ORAL | 3 refills | Status: DC

## 2023-07-07 MED ORDER — TAMSULOSIN HCL 0.4 MG PO CAPS: 0.4000 mg | ORAL_CAPSULE | Freq: Every day | ORAL | 3 refills | Status: DC

## 2023-07-07 NOTE — Progress Notes (Signed)
I,Amy L Pierron,acting as a scribe for Terry Scotland, MD.,have documented all relevant documentation on the behalf of Terry Scotland, MD,as directed by  Terry Scotland, MD while in the presence of Terry Scotland, MD.  07/07/2023 11:19 AM   [name] Jul 15, 1933 [id number]  Referring provider: Allegra Grana, FNP [address] [place],  [place] [address]  Chief Complaint  Patient presents with   Benign Prostatic Hypertrophy    HPI: 89 year-old male with a personal history of severe epididymoorchitis and BPH presents today for routine annual follow-up.   At one point in time he was on self-cath, however is no longer having to do this.   He is on chronic Flomax and Finasteride.  His renal function is stable with a creatinine of 1.2 which is improved from previous years.  Urinalysis today is negative.   He is doing well overall with no new symptoms or complaints. He uses baby wipes each morning to wipe the head of his penis.   IPSS     Row Name 07/07/23 1000         International Prostate Symptom Score   How often have you had the sensation of not emptying your bladder? Not at All     How often have you had to urinate less than every two hours? Less than half the time     How often have you found you stopped and started again several times when you urinated? Less than 1 in 5 times     How often have you found it difficult to postpone urination? Less than 1 in 5 times     How often have you had a weak urinary stream? Less than 1 in 5 times     How often have you had to strain to start urination? Not at All     How many times did you typically get up at night to urinate? 3 Times     Total IPSS Score 8       Quality of Life due to urinary symptoms   If you were to spend the rest of your life with your urinary condition just the way it is now how would you feel about that? Mostly Satisfied            Score:  1-7 Mild 8-19 Moderate 20-35  Severe Results for orders placed or performed in visit on 07/07/23  Bladder Scan (Post Void Residual) in office  Result Value Ref Range   Scan Result 42 ml     PMH: Past Medical History:  Diagnosis Date   Bone spur 1988   left heel   Bursitis 1987   right shoulder   Cellulitis 1986   right leg   Pneumonia    noted 07/17/17 CXR    Shingles 1959   Urinary retention    UTI (urinary tract infection)    sepsis E coli UTI +urine and blood cultures 07/17/17     Surgical History: Past Surgical History:  Procedure Laterality Date   APPENDECTOMY  1942   CHOLECYSTECTOMY  1966   EYE SURGERY  2015   catarcts extracted   IRRIGATION AND DEBRIDEMENT FOOT Right 12/09/2019   Procedure: IRRIGATION AND DEBRIDEMENT HEEL;  Surgeon: Rosetta Posner, DPM;  Location: [facility] ORS;  Service: Podiatry;  Laterality: Right;   JOINT REPLACEMENT     right hip 20 + years from 2019, left hip replaced 05/01/17    MELANOMA EXCISION  04.2010   mole removal right  arm   TONSILLECTOMY AND ADENOIDECTOMY  1944   TOTAL HIP ARTHROPLASTY  1999   right hip    Home Medications:  Allergies as of 07/07/2023       Reactions   Codeine Other (See Comments)   Alters mental state        Medication List        Accurate as of July 07, 2023 11:19 AM. If you have any questions, ask your nurse or doctor.          docusate sodium 100 MG capsule Commonly known as: COLACE Take 100 mg by mouth daily as needed for mild constipation.   finasteride 5 MG tablet Commonly known as: PROSCAR Take 1 tablet (5 mg total) by mouth daily. What changed: See the new instructions.   losartan 25 MG tablet Commonly known as: COZAAR   tamsulosin 0.4 MG Caps capsule Commonly known as: FLOMAX Take 1 capsule (0.4 mg total) by mouth daily. What changed: See the new instructions.   Voltaren Arthritis Pain 1 % Gel Generic drug: diclofenac Sodium Apply 2 g topically 4 (four) times daily.        Allergies:  Allergies   Allergen Reactions   Codeine Other (See Comments)    Alters mental state    Family History: Family History  Problem Relation Age of Onset   Clotting disorder Mother    Heart disease Father 17   Cancer Sister        breast   Cerebral aneurysm Brother        hemmorage   Cancer Daughter        breast    Social History:  reports that he has never smoked. He has never used smokeless tobacco. He reports that he does not drink alcohol and does not use drugs.   Physical Exam: BP 128/74   Pulse 92   Ht 6' (1.829 m)   Wt 270 lb 6 oz (122.6 kg)   BMI 36.67 kg/m   Constitutional:  Alert and oriented, No acute distress. HEENT: [place] AT, moist mucus membranes.  Trachea midline, no masses. Neurologic: Grossly intact, no focal deficits, moving all 4 extremities. Psychiatric: Normal mood and affect.   Assessment & Plan:    1. BPH with urinary obstruction  - Symptoms and emptying stable on Flomax and finasteride. Will refill these for him in 90 increments.  - Chronic right renal atrophy but no obstruction hydronephrosis or incomplete emptying contributing to his renal dysfunction  - Will continue to follow him on an annual basis given his history -Continues to empty well  Return in about 1 year (around 07/06/2024) for IPSS, PVR.  I have reviewed the above documentation for accuracy and completeness, and I agree with the above.   Terry Scotland, MD  [facility] [address], [address] [place], [place] [address] [phone]

## 2023-07-14 ENCOUNTER — Ambulatory Visit (INDEPENDENT_AMBULATORY_CARE_PROVIDER_SITE_OTHER): Payer: Medicare Other | Admitting: Podiatry

## 2023-07-14 ENCOUNTER — Encounter: Payer: Self-pay | Admitting: Podiatry

## 2023-07-14 DIAGNOSIS — L97412 Non-pressure chronic ulcer of right heel and midfoot with fat layer exposed: Secondary | ICD-10-CM

## 2023-07-14 NOTE — Progress Notes (Signed)
Chief Complaint  Patient presents with   Wound Check    "I guess it's doing good.  It keeps swelling up."    HPI: 89 y.o. male presenting today for follow-up evaluation of a nonhealing ulcer to the plantar aspect of the right heel.  When the patient was seen on 04/14/2023 the wound had healed.  He was seen 2 months later and the wound had recurred.  Today he presents about 1 month follow-up and is doing very well  Past Medical History:  Diagnosis Date   Bone spur 1988   left heel   Bursitis 1987   right shoulder   Cellulitis 1986   right leg   Pneumonia    noted 07/17/17 CXR    Shingles 1959   Urinary retention    UTI (urinary tract infection)    sepsis E coli UTI +urine and blood cultures 07/17/17     Past Surgical History:  Procedure Laterality Date   APPENDECTOMY  1942   CHOLECYSTECTOMY  1966   EYE SURGERY  2015   catarcts extracted   IRRIGATION AND DEBRIDEMENT FOOT Right 12/09/2019   Procedure: IRRIGATION AND DEBRIDEMENT HEEL;  Surgeon: Rosetta Posner, DPM;  Location: [facility] ORS;  Service: Podiatry;  Laterality: Right;   JOINT REPLACEMENT     right hip 20 + years from 2019, left hip replaced 05/01/17    MELANOMA EXCISION  04.2010   mole removal right arm   TONSILLECTOMY AND ADENOIDECTOMY  1944   TOTAL HIP ARTHROPLASTY  1999   right hip    Allergies  Allergen Reactions   Codeine Other (See Comments)    Alters mental state     RT heel 02/17/2023   RT heel 06/16/2023  Physical Exam: General: The patient is alert and oriented x3 in no acute distress.  Dermatology: The ulcer to the plantar aspect of the heel is resolved and healed.  No open wound noted.  There is some callus tissue around the area.  Vascular: Palpable pedal pulses bilaterally. Capillary refill within normal limits.  Negative for any significant edema or erythema.  Clinically there is no concern for vascular compromise  VAS [place] ABI W/WO TBI 06/24/2022 ABI Findings:   +---------+------------------+-----+---------+--------+  Right   Rt Pressure (mmHg)IndexWaveform Comment   +---------+------------------+-----+---------+--------+  Brachial 141                                       +---------+------------------+-----+---------+--------+  PTA     239               1.70 triphasic          +---------+------------------+-----+---------+--------+  DP      227               1.61 triphasic          +---------+------------------+-----+---------+--------+  Great Toe169               1.20 Normal             +---------+------------------+-----+---------+--------+   +---------+------------------+-----+---------+-------+  Left    Lt Pressure (mmHg)IndexWaveform Comment  +---------+------------------+-----+---------+-------+  Brachial 140                                      +---------+------------------+-----+---------+-------+  PTA     255  1.81 triphasic         +---------+------------------+-----+---------+-------+  DP      236               1.67 triphasic         +---------+------------------+-----+---------+-------+  Randie Heinz Toe191               1.35 Normal            +---------+------------------+-----+---------+-------+   +-------+----------------+-----------+----------------+------------+  ABI/TBIToday's ABI     Today's TBIPrevious ABI    Previous TBI  +-------+----------------+-----------+----------------+------------+  Right Non compressible1.20       Non compressible0.77          +-------+----------------+-----------+----------------+------------+  Left  Non compressible1.35       Non compressible0.88          +-------+----------------+-----------+----------------+------------+  Bilateral ABIs appear essentially unchanged compared to prior study on  06/25/2021.    Summary:  Right: Resting right ankle-brachial index indicates noncompressible right  lower  extremity arteries. The right toe-brachial index is normal.   Left: Resting left ankle-brachial index is within normal range. The left  toe-brachial index is normal.    Neurological: Light touch and protective threshold grossly intact  Musculoskeletal Exam: Cavus foot type noted with increased dorsiflexion of the ankle joint.  Compromised plantarflexion against resistance compared to the contralateral limb  Radiographic Exam RT foot 10/28/2022:  Normal osseous mineralization.  No osseous erosions noted.  Plantar heel spur noted which does not seem to contribute to the plantar ulcer.  Steep increased calcaneal inclination angle noted with calcifications along the Achilles tendon mid substance both alluding to history of Achilles compromise or possible Achilles tendon rupture  MR FOOT RIGHT WO CONTRAST 12/07/2019: Achilles: Thickening of the distal Achilles tendon measuring 16 mm in AP dimension with intermediate intrasubstance signal and a moderate partial-thickness insertional tear which measures 2.2 cm in transverse dimension (series 5, image 15; series 8, images 8-11). There are multiple ossifications within the distal tendon. No full-thickness Achilles tendon tear. No retrocalcaneal bursitis or peritendinous edema.  IMPRESSION: 1. Negative for acute osteomyelitis. 2. Soft tissue ulceration and cellulitis of the right heel underlying the posterior calcaneus without discrete sinus tract or fluid collection extending to the underlying cortex. No abnormal marrow signal within the calcaneus. 3. Distal Achilles tendinosis with moderate partial-thickness insertional tear. 4. Distal tibialis posterior tendinosis. 5. Findings suggesting chronic plantar fasciitis.  Assessment: 1.  Ulcer plantar heel right 2.  Achilles compromise right with this foot type   Plan of Care:  -Patient evaluated.   - Light debridement of the plantar heel performed today using a 312 scalpel.  Today there is no  open wound.  It actually looks to be healing nicely -Padded bandage applied. -Continue custom molded insoles with heel offloaded.  Additional quarter inch felt 'horseshoe' type padding was applied to the heel to add additional offloading to the central portion of the heel -Advised against going barefoot -Nails were debrided by Dr. Donzetta Matters on 05/21/2023. - Return to clinic with me 6 weeks     Felecia Shelling, DPM [facility]  Dr. Felecia Shelling, DPM    [address], [place] [address]  Office [phone]  Fax [phone]  Depression

## 2023-08-25 ENCOUNTER — Ambulatory Visit (INDEPENDENT_AMBULATORY_CARE_PROVIDER_SITE_OTHER): Payer: Medicare Other | Admitting: Podiatry

## 2023-08-25 ENCOUNTER — Encounter: Payer: Self-pay | Admitting: Podiatry

## 2023-08-25 DIAGNOSIS — L97412 Non-pressure chronic ulcer of right heel and midfoot with fat layer exposed: Secondary | ICD-10-CM

## 2023-08-25 NOTE — Progress Notes (Signed)
Chief Complaint  Patient presents with   Callouses    "I can feel it when it builds up.  I guess it's okay."    HPI: 89 y.o. male presenting today for follow-up evaluation of a nonhealing ulcer to the plantar aspect of the right heel.  Patient doing well.  Slowly over time the callus continues to recur.  Presenting for routine debridement of the heel wound  Past Medical History:  Diagnosis Date   Bone spur 1988   left heel   Bursitis 1987   right shoulder   Cellulitis 1986   right leg   Pneumonia    noted 07/17/17 CXR    Shingles 1959   Urinary retention    UTI (urinary tract infection)    sepsis E coli UTI +urine and blood cultures 07/17/17     Past Surgical History:  Procedure Laterality Date   APPENDECTOMY  1942   CHOLECYSTECTOMY  1966   EYE SURGERY  2015   catarcts extracted   IRRIGATION AND DEBRIDEMENT FOOT Right 12/09/2019   Procedure: IRRIGATION AND DEBRIDEMENT HEEL;  Surgeon: Rosetta Posner, DPM;  Location: [facility] ORS;  Service: Podiatry;  Laterality: Right;   JOINT REPLACEMENT     right hip 20 + years from 2019, left hip replaced 05/01/17    MELANOMA EXCISION  04.2010   mole removal right arm   TONSILLECTOMY AND ADENOIDECTOMY  1944   TOTAL HIP ARTHROPLASTY  1999   right hip    Allergies  Allergen Reactions   Codeine Other (See Comments)    Alters mental state     RT heel 02/17/2023   RT heel 06/16/2023  Physical Exam: General: The patient is alert and oriented x3 in no acute distress.  Dermatology: The ulcer to the plantar aspect of the heel is resolved and healed.  No open wound noted.  There is some callus tissue around the area.  Vascular: Palpable pedal pulses bilaterally. Capillary refill within normal limits.  Negative for any significant edema or erythema.  Clinically there is no concern for vascular compromise  VAS [place] ABI W/WO TBI 06/24/2022 ABI Findings:  +---------+------------------+-----+---------+--------+  Right   Rt Pressure  (mmHg)IndexWaveform Comment   +---------+------------------+-----+---------+--------+  Brachial 141                                       +---------+------------------+-----+---------+--------+  PTA     239               1.70 triphasic          +---------+------------------+-----+---------+--------+  DP      227               1.61 triphasic          +---------+------------------+-----+---------+--------+  Great Toe169               1.20 Normal             +---------+------------------+-----+---------+--------+   +---------+------------------+-----+---------+-------+  Left    Lt Pressure (mmHg)IndexWaveform Comment  +---------+------------------+-----+---------+-------+  Brachial 140                                      +---------+------------------+-----+---------+-------+  PTA     255               1.81 triphasic         +---------+------------------+-----+---------+-------+  DP      236               1.67 triphasic         +---------+------------------+-----+---------+-------+  Great Toe191               1.35 Normal            +---------+------------------+-----+---------+-------+   +-------+----------------+-----------+----------------+------------+  ABI/TBIToday's ABI     Today's TBIPrevious ABI    Previous TBI  +-------+----------------+-----------+----------------+------------+  Right Non compressible1.20       Non compressible0.77          +-------+----------------+-----------+----------------+------------+  Left  Non compressible1.35       Non compressible0.88          +-------+----------------+-----------+----------------+------------+  Bilateral ABIs appear essentially unchanged compared to prior study on  06/25/2021.    Summary:  Right: Resting right ankle-brachial index indicates noncompressible right  lower extremity arteries. The right toe-brachial index is normal.   Left: Resting left  ankle-brachial index is within normal range. The left  toe-brachial index is normal.    Neurological: Light touch and protective threshold grossly intact  Musculoskeletal Exam: Cavus foot type noted with increased dorsiflexion of the ankle joint.  Compromised plantarflexion against resistance compared to the contralateral limb  Radiographic Exam RT foot 10/28/2022:  Normal osseous mineralization.  No osseous erosions noted.  Plantar heel spur noted which does not seem to contribute to the plantar ulcer.  Steep increased calcaneal inclination angle noted with calcifications along the Achilles tendon mid substance both alluding to history of Achilles compromise or possible Achilles tendon rupture  MR FOOT RIGHT WO CONTRAST 12/07/2019: Achilles: Thickening of the distal Achilles tendon measuring 16 mm in AP dimension with intermediate intrasubstance signal and a moderate partial-thickness insertional tear which measures 2.2 cm in transverse dimension (series 5, image 15; series 8, images 8-11). There are multiple ossifications within the distal tendon. No full-thickness Achilles tendon tear. No retrocalcaneal bursitis or peritendinous edema.  IMPRESSION: 1. Negative for acute osteomyelitis. 2. Soft tissue ulceration and cellulitis of the right heel underlying the posterior calcaneus without discrete sinus tract or fluid collection extending to the underlying cortex. No abnormal marrow signal within the calcaneus. 3. Distal Achilles tendinosis with moderate partial-thickness insertional tear. 4. Distal tibialis posterior tendinosis. 5. Findings suggesting chronic plantar fasciitis.  Assessment: 1.  Ulcer plantar heel right 2.  Achilles compromise right with this foot type   Plan of Care:  -Patient evaluated.   - Light debridement of the plantar heel performed today using a 312 scalpel.  Today there is no open wound.  It actually looks to be healing nicely -Padded bandage  applied. -Continue custom molded insoles with heel offloaded.  Additional quarter inch felt 'horseshoe' type padding was applied to the heel to add additional offloading to the central portion of the heel -Advised against going barefoot -Nails were debrided by Dr. Donzetta Matters on 05/21/2023. - Return to clinic with me 6 weeks     Felecia Shelling, DPM [facility]  Dr. Felecia Shelling, DPM    [address] [place], [place] [address]                Office [phone]  Fax [phone]  Depression

## 2023-08-27 ENCOUNTER — Ambulatory Visit (INDEPENDENT_AMBULATORY_CARE_PROVIDER_SITE_OTHER): Payer: Medicare Other | Admitting: Podiatry

## 2023-08-27 ENCOUNTER — Encounter: Payer: Self-pay | Admitting: Podiatry

## 2023-08-27 VITALS — Ht 72.0 in | Wt 270.4 lb

## 2023-08-27 DIAGNOSIS — B351 Tinea unguium: Secondary | ICD-10-CM

## 2023-08-27 DIAGNOSIS — I739 Peripheral vascular disease, unspecified: Secondary | ICD-10-CM

## 2023-08-27 DIAGNOSIS — M79676 Pain in unspecified toe(s): Secondary | ICD-10-CM | POA: Diagnosis not present

## 2023-08-30 ENCOUNTER — Other Ambulatory Visit: Payer: Self-pay | Admitting: Urology

## 2023-08-30 DIAGNOSIS — N401 Enlarged prostate with lower urinary tract symptoms: Secondary | ICD-10-CM

## 2023-08-30 NOTE — Progress Notes (Signed)
  Subjective:  Patient ID: Terry Vaughn, male    DOB: 1933/05/08,  MRN: [id number]  89 y.o. male presents to clinic with  at risk foot care. Patient has h/o PAD and painful, elongated thickened toenails x 10 which are symptomatic when wearing enclosed shoe gear. This interferes with his/her daily activities.  He continues to see Dr. Logan Bores for management of diabetic ulcer right heel. He is accompanied by his son on today's visit. Chief Complaint  Patient presents with   Nail Problem    Pt is here for RFC not a diabetic PCP [name], LOV was in October.     New problem(s): None   PCP is [name], Lyn Records, FNP.  Allergies  Allergen Reactions   Codeine Other (See Comments)    Alters mental state    Review of Systems: Negative except as noted in the HPI.   Objective:  Terry Vaughn is a pleasant 89 y.o. male morbidly obese in NAD. AAO x 3.  Vascular Examination: Capillary refill time <3 seconds b/l LE. Palpable pedal pulses b/l LE. Digital hair absent b/l. No pedal edema b/l. Skin temperature gradient WNL b/l. No ischemia or gangrene noted b/l LE. No cyanosis or clubbing noted b/l LE.Marland Kitchen  Dermatological Examination: Dressing right heel clean, dry and intact.   Pedal skin with normal turgor, texture and tone b/l. No open wounds. No interdigital macerations b/l. Toenails 1-5 b/l thickened, discolored, dystrophic with subungual debris. There is pain on palpation to dorsal aspect of nailplates. No hyperkeratotic nor porokeratotic lesions present on today's visit.Marland Kitchen  Neurological Examination: Protective sensation intact with 10 gram monofilament b/l LE. Vibratory sensation intact b/l LE.   Musculoskeletal Examination: Muscle strength 5/5 to all lower extremity muscle groups bilaterally. No pain, crepitus or joint limitation noted with ROM bilateral LE.  Last A1c:       No data to display           Assessment:   1. Pain due to onychomycosis of toenail   2. PVD (peripheral vascular  disease) (HCC)    Plan:  Patient was evaluated and treated. All patient's and/or POA's questions/concerns addressed on today's visit. Toenails 1-5 debrided in length and girth without incident. Continue soft, supportive shoe gear daily. Report any pedal injuries to medical professional. Call office if there are any questions/concerns. Continue follow up with Dr. Gala Lewandowsky for diabetic ulcer right heel. -Patient/POA to call should there be question/concern in the interim.  Return in about 3 months (around 11/25/2023).  Freddie Breech, DPM      [facility] LOCATION: [address], [place] [address]                   Office [phone]   [facility] LOCATION: [address] [place], [place] [address] Office [phone]

## 2023-10-06 ENCOUNTER — Ambulatory Visit (INDEPENDENT_AMBULATORY_CARE_PROVIDER_SITE_OTHER): Payer: Medicare Other | Admitting: Podiatry

## 2023-10-06 ENCOUNTER — Encounter: Payer: Self-pay | Admitting: Podiatry

## 2023-10-06 DIAGNOSIS — L97412 Non-pressure chronic ulcer of right heel and midfoot with fat layer exposed: Secondary | ICD-10-CM | POA: Diagnosis not present

## 2023-10-06 DIAGNOSIS — M67873 Other specified disorders of tendon, right ankle and foot: Secondary | ICD-10-CM

## 2023-10-06 NOTE — Progress Notes (Signed)
 Chief Complaint  Patient presents with   Diabetic Ulcer    "I think it's doing okay."    HPI: 89 y.o. male presenting today for follow-up evaluation of a nonhealing ulcer to the plantar aspect of the right heel.  Patient doing well.  Slowly over time the callus continues to recur.  Presenting for routine debridement of the heel wound  Past Medical History:  Diagnosis Date   Bone spur 1988   left heel   Bursitis 1987   right shoulder   Cellulitis 1986   right leg   Pneumonia    noted 07/17/17 CXR    Shingles 1959   Urinary retention    UTI (urinary tract infection)    sepsis E coli UTI +urine and blood cultures 07/17/17     Past Surgical History:  Procedure Laterality Date   APPENDECTOMY  1942   CHOLECYSTECTOMY  1966   EYE SURGERY  2015   catarcts extracted   IRRIGATION AND DEBRIDEMENT FOOT Right 12/09/2019   Procedure: IRRIGATION AND DEBRIDEMENT HEEL;  Surgeon: Rosetta Posner, DPM;  Location: [facility] ORS;  Service: Podiatry;  Laterality: Right;   JOINT REPLACEMENT     right hip 20 + years from 2019, left hip replaced 05/01/17    MELANOMA EXCISION  04.2010   mole removal right arm   TONSILLECTOMY AND ADENOIDECTOMY  1944   TOTAL HIP ARTHROPLASTY  1999   right hip    Allergies  Allergen Reactions   Codeine Other (See Comments)    Alters mental state     RT heel 02/17/2023   RT heel 06/16/2023  Physical Exam: General: The patient is alert and oriented x3 in no acute distress.  Dermatology: Superficial ulcer after debridement noted today measuring approximately 0.3 x 0.3 x 0 point centimeters.  Overall it appears very superficial and stable.  Granular wound base.  No drainage.  No erythema around the heel or concern clinically for infection.  Vascular: Palpable pedal pulses bilaterally. Capillary refill within normal limits.  Negative for any significant edema or erythema.  Clinically there is no concern for vascular compromise  VAS [place] ABI W/WO TBI 06/24/2022 ABI  Findings:  +---------+------------------+-----+---------+--------+  Right   Rt Pressure (mmHg)IndexWaveform Comment   +---------+------------------+-----+---------+--------+  Brachial 141                                       +---------+------------------+-----+---------+--------+  PTA     239               1.70 triphasic          +---------+------------------+-----+---------+--------+  DP      227               1.61 triphasic          +---------+------------------+-----+---------+--------+  Great Toe169               1.20 Normal             +---------+------------------+-----+---------+--------+   +---------+------------------+-----+---------+-------+  Left    Lt Pressure (mmHg)IndexWaveform Comment  +---------+------------------+-----+---------+-------+  Brachial 140                                      +---------+------------------+-----+---------+-------+  PTA     255  1.81 triphasic         +---------+------------------+-----+---------+-------+  DP      236               1.67 triphasic         +---------+------------------+-----+---------+-------+  Randie Heinz Toe191               1.35 Normal            +---------+------------------+-----+---------+-------+   +-------+----------------+-----------+----------------+------------+  ABI/TBIToday's ABI     Today's TBIPrevious ABI    Previous TBI  +-------+----------------+-----------+----------------+------------+  Right Non compressible1.20       Non compressible0.77          +-------+----------------+-----------+----------------+------------+  Left  Non compressible1.35       Non compressible0.88          +-------+----------------+-----------+----------------+------------+  Bilateral ABIs appear essentially unchanged compared to prior study on  06/25/2021.    Summary:  Right: Resting right ankle-brachial index indicates noncompressible right   lower extremity arteries. The right toe-brachial index is normal.   Left: Resting left ankle-brachial index is within normal range. The left  toe-brachial index is normal.    Neurological: Light touch and protective threshold grossly intact  Musculoskeletal Exam: Cavus foot type noted with increased dorsiflexion of the ankle joint.  Compromised plantarflexion against resistance compared to the contralateral limb  Radiographic Exam RT foot 10/28/2022:  Normal osseous mineralization.  No osseous erosions noted.  Plantar heel spur noted which does not seem to contribute to the plantar ulcer.  Steep increased calcaneal inclination angle noted with calcifications along the Achilles tendon mid substance both alluding to history of Achilles compromise or possible Achilles tendon rupture  MR FOOT RIGHT WO CONTRAST 12/07/2019: Achilles: Thickening of the distal Achilles tendon measuring 16 mm in AP dimension with intermediate intrasubstance signal and a moderate partial-thickness insertional tear which measures 2.2 cm in transverse dimension (series 5, image 15; series 8, images 8-11). There are multiple ossifications within the distal tendon. No full-thickness Achilles tendon tear. No retrocalcaneal bursitis or peritendinous edema.  IMPRESSION: 1. Negative for acute osteomyelitis. 2. Soft tissue ulceration and cellulitis of the right heel underlying the posterior calcaneus without discrete sinus tract or fluid collection extending to the underlying cortex. No abnormal marrow signal within the calcaneus. 3. Distal Achilles tendinosis with moderate partial-thickness insertional tear. 4. Distal tibialis posterior tendinosis. 5. Findings suggesting chronic plantar fasciitis.  Assessment: 1.  Ulcer plantar heel right 2.  Achilles compromise right lower extremity likely secondary to chronic history of Achilles compromise   Plan of Care:  -Patient evaluated.   - Medically necessary excisional  debridement including subcutaneous tissue was performed today using a 312 scalpel and tissue nipper.  Excisional debridement of the necrotic nonviable tissue down to healthier bleeding viable tissue was performed with postdebridement measurement same as pre- -Padded bandage applied. -Continue custom molded insoles with heel offloaded.  Additional quarter inch felt 'horseshoe' type padding was applied to the heel to add additional offloading to the central portion of the heel -Advised against going barefoot - Return to clinic with me 6 weeks for routine management and care     Felecia Shelling, DPM [facility]  Dr. Felecia Shelling, DPM    [address]  [place], [place] [address]                Office [phone]  Fax [phone]  Depression

## 2023-11-17 ENCOUNTER — Encounter: Payer: Self-pay | Admitting: Podiatry

## 2023-11-17 ENCOUNTER — Ambulatory Visit (INDEPENDENT_AMBULATORY_CARE_PROVIDER_SITE_OTHER): Admitting: Podiatry

## 2023-11-17 DIAGNOSIS — M67873 Other specified disorders of tendon, right ankle and foot: Secondary | ICD-10-CM | POA: Diagnosis not present

## 2023-11-17 DIAGNOSIS — L97412 Non-pressure chronic ulcer of right heel and midfoot with fat layer exposed: Secondary | ICD-10-CM | POA: Diagnosis not present

## 2023-11-17 NOTE — Progress Notes (Signed)
 Chief Complaint  Patient presents with   Foot Ulcer    "It's about the same."    HPI: 89 y.o. male presenting today for follow-up evaluation of a nonhealing ulcer to the plantar aspect of the right heel.  Patient doing well.  Slowly over time the callus continues to recur.  Presenting for routine debridement of the heel wound  Past Medical History:  Diagnosis Date   Bone spur 1988   left heel   Bursitis 1987   right shoulder   Cellulitis 1986   right leg   Pneumonia    noted 07/17/17 CXR    Shingles 1959   Urinary retention    UTI (urinary tract infection)    sepsis E coli UTI +urine and blood cultures 07/17/17     Past Surgical History:  Procedure Laterality Date   APPENDECTOMY  1942   CHOLECYSTECTOMY  1966   EYE SURGERY  2015   catarcts extracted   IRRIGATION AND DEBRIDEMENT FOOT Right 12/09/2019   Procedure: IRRIGATION AND DEBRIDEMENT HEEL;  Surgeon: Pink Bridges, DPM;  Location: [facility] ORS;  Service: Podiatry;  Laterality: Right;   JOINT REPLACEMENT     right hip 20 + years from 2019, left hip replaced 05/01/17    MELANOMA EXCISION  04.2010   mole removal right arm   TONSILLECTOMY AND ADENOIDECTOMY  1944   TOTAL HIP ARTHROPLASTY  1999   right hip    Allergies  Allergen Reactions   Codeine Other (See Comments)    Alters mental state     RT heel 02/17/2023   RT heel 06/16/2023  Physical Exam: General: The patient is alert and oriented x3 in no acute distress.  Dermatology: Superficial ulcer after debridement noted today measuring approximately 0.3 x 0.3 x 0 point centimeters.  Overall it appears very superficial and stable.  Granular wound base.  No drainage.  No erythema around the heel or concern clinically for infection.  Vascular: Palpable pedal pulses bilaterally. Capillary refill within normal limits.  Negative for any significant edema or erythema.  Clinically there is no concern for vascular compromise  VAS US  ABI W/WO TBI 06/24/2022 ABI Findings:   +---------+------------------+-----+---------+--------+  Right   Rt Pressure (mmHg)IndexWaveform Comment   +---------+------------------+-----+---------+--------+  Brachial 141                                       +---------+------------------+-----+---------+--------+  PTA     239               1.70 triphasic          +---------+------------------+-----+---------+--------+  DP      227               1.61 triphasic          +---------+------------------+-----+---------+--------+  Great Toe169               1.20 Normal             +---------+------------------+-----+---------+--------+   +---------+------------------+-----+---------+-------+  Left    Lt Pressure (mmHg)IndexWaveform Comment  +---------+------------------+-----+---------+-------+  Brachial 140                                      +---------+------------------+-----+---------+-------+  PTA     255  1.81 triphasic         +---------+------------------+-----+---------+-------+  DP      236               1.67 triphasic         +---------+------------------+-----+---------+-------+  Suella Emmer Toe191               1.35 Normal            +---------+------------------+-----+---------+-------+   +-------+----------------+-----------+----------------+------------+  ABI/TBIToday's ABI     Today's TBIPrevious ABI    Previous TBI  +-------+----------------+-----------+----------------+------------+  Right Non compressible1.20       Non compressible0.77          +-------+----------------+-----------+----------------+------------+  Left  Non compressible1.35       Non compressible0.88          +-------+----------------+-----------+----------------+------------+  Bilateral ABIs appear essentially unchanged compared to prior study on  06/25/2021.    Summary:  Right: Resting right ankle-brachial index indicates noncompressible right  lower  extremity arteries. The right toe-brachial index is normal.   Left: Resting left ankle-brachial index is within normal range. The left  toe-brachial index is normal.    Neurological: Light touch and protective threshold grossly intact  Musculoskeletal Exam: Cavus foot type noted with increased dorsiflexion of the ankle joint.  Compromised plantarflexion against resistance compared to the contralateral limb  Radiographic Exam RT foot 10/28/2022:  Normal osseous mineralization.  No osseous erosions noted.  Plantar heel spur noted which does not seem to contribute to the plantar ulcer.  Steep increased calcaneal inclination angle noted with calcifications along the Achilles tendon mid substance both alluding to history of Achilles compromise or possible Achilles tendon rupture  MR FOOT RIGHT WO CONTRAST 12/07/2019: Achilles: Thickening of the distal Achilles tendon measuring 16 mm in AP dimension with intermediate intrasubstance signal and a moderate partial-thickness insertional tear which measures 2.2 cm in transverse dimension (series 5, image 15; series 8, images 8-11). There are multiple ossifications within the distal tendon. No full-thickness Achilles tendon tear. No retrocalcaneal bursitis or peritendinous edema.  IMPRESSION: 1. Negative for acute osteomyelitis. 2. Soft tissue ulceration and cellulitis of the right heel underlying the posterior calcaneus without discrete sinus tract or fluid collection extending to the underlying cortex. No abnormal marrow signal within the calcaneus. 3. Distal Achilles tendinosis with moderate partial-thickness insertional tear. 4. Distal tibialis posterior tendinosis. 5. Findings suggesting chronic plantar fasciitis.  Assessment: 1.  Ulcer plantar heel right 2.  Achilles compromise right lower extremity likely secondary to chronic history of Achilles compromise   Plan of Care:  -Patient evaluated.   - Medically necessary excisional  debridement including subcutaneous tissue was performed today using a 312 scalpel and tissue nipper.  Excisional debridement of the necrotic nonviable tissue down to healthier bleeding viable tissue was performed with postdebridement measurement same as pre- -Padded bandage applied. -Continue custom molded insoles with heel offloaded.  Additional quarter inch felt 'horseshoe' type padding was applied to the heel to add additional offloading to the central portion of the heel -Advised against going barefoot - Return to clinic with me 6 weeks for routine management and care     Dot Gazella, DPM [facility]  Dr. Dot Gazella, DPM    [address]  [place], [place] [address]                Office [phone]  Fax [phone]  Depression

## 2023-11-26 ENCOUNTER — Encounter: Payer: Self-pay | Admitting: Podiatry

## 2023-11-26 ENCOUNTER — Ambulatory Visit: Payer: Medicare Other | Admitting: Podiatry

## 2023-11-26 DIAGNOSIS — I739 Peripheral vascular disease, unspecified: Secondary | ICD-10-CM | POA: Diagnosis not present

## 2023-11-26 DIAGNOSIS — M79676 Pain in unspecified toe(s): Secondary | ICD-10-CM

## 2023-11-26 DIAGNOSIS — B351 Tinea unguium: Secondary | ICD-10-CM | POA: Diagnosis not present

## 2023-11-26 NOTE — Progress Notes (Unsigned)
  Subjective:  Patient ID: Terry Vaughn, male    DOB: 08/10/1932,  MRN: [id number]  89 y.o. male presents to clinic with  at risk foot care. Patient has h/o PAD and painful mycotic toenails of both feet that are difficult to trim. Pain interferes with daily activities and wearing enclosed shoe gear comfortably. His son is present during today's visit. He is still followed by Dr. Wilnette Haste for right heel ulcer.  Chief Complaint  Patient presents with   Nail Problem    "I'm here for toenail clipping."   New problem(s): None   PCP is [name], Hanley Lew, FNP.  Allergies  Allergen Reactions   Codeine Other (See Comments)    Alters mental state   Review of Systems: Negative except as noted in the HPI.   Objective:  Terry Vaughn is a pleasant 89 y.o. male obese in NAD. AAO x 3.  Vascular Examination: Capillary refill time <3 seconds b/l LE. Palpable pedal pulses b/l LE. Digital hair absent b/l. No pedal edema b/l. Skin temperature gradient WNL b/l. No ischemia or gangrene noted b/l LE. No cyanosis or clubbing noted b/l LE.Terry Vaughn  Dermatological Examination: Right heel with no dressing. No open wound noted.  Pedal skin with normal turgor, texture and tone b/l. No open wounds. No interdigital macerations b/l. Toenails 1-5 b/l thickened, discolored, dystrophic with subungual debris. There is pain on palpation to dorsal aspect of nailplates. No hyperkeratotic nor porokeratotic lesions present on today's visit.Terry Vaughn  Neurological Examination: Protective sensation intact with 10 gram monofilament b/l LE. Vibratory sensation intact b/l LE.   Musculoskeletal Examination: Muscle strength 5/5 to all lower extremity muscle groups bilaterally. No pain, crepitus or joint limitation noted with ROM bilateral LE.  Last A1c:       No data to display           Assessment:   1. Pain due to onychomycosis of toenail   2. PVD (peripheral vascular disease) (HCC)    Plan:  Consent given for treatment.  Patient examined. All patient's and/or POA's questions/concerns addressed on today's visit. Mycotic toenails 1-5 debrided in length and girth without incident. Continue soft, supportive shoe gear daily. Report any pedal injuries to medical professional. Call office if there are any quesitons/concerns. -Patient/POA to call should there be question/concern in the interim.  Return in about 3 months (around 02/26/2024).  Terry Vaughn, DPM      [facility] LOCATION: [address], [place] [address]                   Office [phone]   [facility] LOCATION: [address] [place], [place] [address] Office [phone]

## 2023-12-03 ENCOUNTER — Encounter: Payer: Self-pay | Admitting: Podiatry

## 2023-12-29 ENCOUNTER — Encounter: Payer: Self-pay | Admitting: Podiatry

## 2023-12-29 ENCOUNTER — Ambulatory Visit (INDEPENDENT_AMBULATORY_CARE_PROVIDER_SITE_OTHER): Admitting: Podiatry

## 2023-12-29 VITALS — Ht 72.0 in | Wt 270.4 lb

## 2023-12-29 DIAGNOSIS — L97412 Non-pressure chronic ulcer of right heel and midfoot with fat layer exposed: Secondary | ICD-10-CM

## 2023-12-29 NOTE — Progress Notes (Signed)
 Chief Complaint  Patient presents with   Callouses    Pt is here due to ulcer on the bottom of his right foot, states his foot is fine.    HPI: 89 y.o. male presenting today for routine management of a chronic right heel ulcer.  Patient's son states that he has been doing a significant amount of ambulating and walking which is healthy for him.  Unfortunately the wound to the plantar heel has returned  Past Medical History:  Diagnosis Date   Bone spur 1988   left heel   Bursitis 1987   right shoulder   Cellulitis 1986   right leg   Pneumonia    noted 07/17/17 CXR    Shingles 1959   Urinary retention    UTI (urinary tract infection)    sepsis E coli UTI +urine and blood cultures 07/17/17     Past Surgical History:  Procedure Laterality Date   APPENDECTOMY  1942   CHOLECYSTECTOMY  1966   EYE SURGERY  2015   catarcts extracted   IRRIGATION AND DEBRIDEMENT FOOT Right 12/09/2019   Procedure: IRRIGATION AND DEBRIDEMENT HEEL;  Surgeon: Pink Bridges, DPM;  Location: [facility] ORS;  Service: Podiatry;  Laterality: Right;   JOINT REPLACEMENT     right hip 20 + years from 2019, left hip replaced 05/01/17    MELANOMA EXCISION  04.2010   mole removal right arm   TONSILLECTOMY AND ADENOIDECTOMY  1944   TOTAL HIP ARTHROPLASTY  1999   right hip    Allergies  Allergen Reactions   Codeine Other (See Comments)    Alters mental state    Physical Exam: General: The patient is alert and oriented x3 in no acute distress.  Dermatology: Ulcer noted right plantar heel measuring approximately 0.3 x 0.4 x 0.2 cm.  Granular wound base.  Periwound is callused.  The wound does not probe to bone.  There is no obvious exposed bone.  Serosanguineous drainage.  No purulence.  Vascular: Palpable pedal pulses bilaterally. Capillary refill within normal limits.  Negative for any significant edema or erythema.  Clinically there is no concern for vascular compromise  VAS US  ABI W/WO TBI 06/24/2022 ABI  Findings:  +---------+------------------+-----+---------+--------+  Right   Rt Pressure (mmHg)IndexWaveform Comment   +---------+------------------+-----+---------+--------+  Brachial 141                                       +---------+------------------+-----+---------+--------+  PTA     239               1.70 triphasic          +---------+------------------+-----+---------+--------+  DP      227               1.61 triphasic          +---------+------------------+-----+---------+--------+  Great Toe169               1.20 Normal             +---------+------------------+-----+---------+--------+   +---------+------------------+-----+---------+-------+  Left    Lt Pressure (mmHg)IndexWaveform Comment  +---------+------------------+-----+---------+-------+  Brachial 140                                      +---------+------------------+-----+---------+-------+  PTA     255  1.81 triphasic         +---------+------------------+-----+---------+-------+  DP      236               1.67 triphasic         +---------+------------------+-----+---------+-------+  Suella Emmer Toe191               1.35 Normal            +---------+------------------+-----+---------+-------+   +-------+----------------+-----------+----------------+------------+  ABI/TBIToday's ABI     Today's TBIPrevious ABI    Previous TBI  +-------+----------------+-----------+----------------+------------+  Right Non compressible1.20       Non compressible0.77          +-------+----------------+-----------+----------------+------------+  Left  Non compressible1.35       Non compressible0.88          +-------+----------------+-----------+----------------+------------+  Bilateral ABIs appear essentially unchanged compared to prior study on  06/25/2021.    Summary:  Right: Resting right ankle-brachial index indicates noncompressible right   lower extremity arteries. The right toe-brachial index is normal.   Left: Resting left ankle-brachial index is within normal range. The left  toe-brachial index is normal.    Neurological: Light touch and protective threshold grossly intact  Musculoskeletal Exam: Cavus foot type noted with increased dorsiflexion of the ankle joint.  Compromised plantarflexion against resistance compared to the contralateral limb  Radiographic Exam RT foot 10/28/2022:  Normal osseous mineralization.  No osseous erosions noted.  Plantar heel spur noted which does not seem to contribute to the plantar ulcer.  Steep increased calcaneal inclination angle noted with calcifications along the Achilles tendon mid substance both alluding to history of Achilles compromise or possible Achilles tendon rupture  MR FOOT RIGHT WO CONTRAST 12/07/2019: Achilles: Thickening of the distal Achilles tendon measuring 16 mm in AP dimension with intermediate intrasubstance signal and a moderate partial-thickness insertional tear which measures 2.2 cm in transverse dimension (series 5, image 15; series 8, images 8-11). There are multiple ossifications within the distal tendon. No full-thickness Achilles tendon tear. No retrocalcaneal bursitis or peritendinous edema.  IMPRESSION: 1. Negative for acute osteomyelitis. 2. Soft tissue ulceration and cellulitis of the right heel underlying the posterior calcaneus without discrete sinus tract or fluid collection extending to the underlying cortex. No abnormal marrow signal within the calcaneus. 3. Distal Achilles tendinosis with moderate partial-thickness insertional tear. 4. Distal tibialis posterior tendinosis. 5. Findings suggesting chronic plantar fasciitis.  Assessment: 1.  Ulcer plantar heel right 2.  Achilles compromise right with this foot type   Plan of Care:  -Patient evaluated.   -Medically necessary excisional debridement including subcutaneous tissue was performed  using a tissue nipper.  Excisional debridement of the necrotic nonviable tissue down to healthier bleeding viable tissue was performed with postdebridement measurement same as pre-. - Resume daily dressing changes at home -Continue custom molded insoles with heel offloaded.  Additional quarter inch felt 'horseshoe' type padding was applied to the heel to add additional offloading to the central portion of the heel -Return to clinic 4 weeks     Dot Gazella, DPM [facility]  Dr. Dot Gazella, DPM    [address], [place] [address]  Office [phone]  Fax [phone]

## 2024-01-26 ENCOUNTER — Ambulatory Visit (INDEPENDENT_AMBULATORY_CARE_PROVIDER_SITE_OTHER): Admitting: Podiatry

## 2024-01-26 ENCOUNTER — Encounter: Payer: Self-pay | Admitting: Podiatry

## 2024-01-26 VITALS — Ht 72.0 in | Wt 270.4 lb

## 2024-01-26 DIAGNOSIS — L97412 Non-pressure chronic ulcer of right heel and midfoot with fat layer exposed: Secondary | ICD-10-CM | POA: Diagnosis not present

## 2024-01-26 NOTE — Progress Notes (Signed)
 Chief Complaint  Patient presents with   Wound Check    Pt is here to f/u on right foot due to ulcer of the heel, pt states that he thinks the wound is healing good even though he can't see it, still some soreness no other complaints.    HPI: 89 y.o. male presenting today for routine management of a chronic right heel ulcer.  Patient's son states that he has been doing a significant amount of ambulating and walking which is healthy for him.  Unfortunately the wound to the plantar heel has returned  Past Medical History:  Diagnosis Date   Bone spur 1988   left heel   Bursitis 1987   right shoulder   Cellulitis 1986   right leg   Pneumonia    noted 07/17/17 CXR    Shingles 1959   Urinary retention    UTI (urinary tract infection)    sepsis E coli UTI +urine and blood cultures 07/17/17     Past Surgical History:  Procedure Laterality Date   APPENDECTOMY  1942   CHOLECYSTECTOMY  1966   EYE SURGERY  2015   catarcts extracted   IRRIGATION AND DEBRIDEMENT FOOT Right 12/09/2019   Procedure: IRRIGATION AND DEBRIDEMENT HEEL;  Surgeon: Lennie Barter, DPM;  Location: [facility] ORS;  Service: Podiatry;  Laterality: Right;   JOINT REPLACEMENT     right hip 20 + years from 2019, left hip replaced 05/01/17    MELANOMA EXCISION  04.2010   mole removal right arm   TONSILLECTOMY AND ADENOIDECTOMY  1944   TOTAL HIP ARTHROPLASTY  1999   right hip    Allergies  Allergen Reactions   Codeine Other (See Comments)    Alters mental state    Physical Exam: General: The patient is alert and oriented x3 in no acute distress.  Dermatology: Ulcer noted right plantar heel measuring approximately 0.3 x 0.4 x 0.2 cm.  Granular wound base.  Periwound is callused.  The wound does not probe to bone.  There is no obvious exposed bone.  Serosanguineous drainage.  No purulence.  Vascular: Palpable pedal pulses bilaterally. Capillary refill within normal limits.  Negative for any significant edema or  erythema.  Clinically there is no concern for vascular compromise  VAS US  ABI W/WO TBI 06/24/2022 ABI Findings:  +---------+------------------+-----+---------+--------+  Right   Rt Pressure (mmHg)IndexWaveform Comment   +---------+------------------+-----+---------+--------+  Brachial 141                                       +---------+------------------+-----+---------+--------+  PTA     239               1.70 triphasic          +---------+------------------+-----+---------+--------+  DP      227               1.61 triphasic          +---------+------------------+-----+---------+--------+  Great Toe169               1.20 Normal             +---------+------------------+-----+---------+--------+   +---------+------------------+-----+---------+-------+  Left    Lt Pressure (mmHg)IndexWaveform Comment  +---------+------------------+-----+---------+-------+  Brachial 140                                      +---------+------------------+-----+---------+-------+  PTA     255               1.81 triphasic         +---------+------------------+-----+---------+-------+  DP      236               1.67 triphasic         +---------+------------------+-----+---------+-------+  Great Toe191               1.35 Normal            +---------+------------------+-----+---------+-------+   +-------+----------------+-----------+----------------+------------+  ABI/TBIToday's ABI     Today's TBIPrevious ABI    Previous TBI  +-------+----------------+-----------+----------------+------------+  Right Non compressible1.20       Non compressible0.77          +-------+----------------+-----------+----------------+------------+  Left  Non compressible1.35       Non compressible0.88          +-------+----------------+-----------+----------------+------------+  Bilateral ABIs appear essentially unchanged compared to prior study on   06/25/2021.    Summary:  Right: Resting right ankle-brachial index indicates noncompressible right  lower extremity arteries. The right toe-brachial index is normal.   Left: Resting left ankle-brachial index is within normal range. The left  toe-brachial index is normal.    Neurological: Light touch and protective threshold grossly intact  Musculoskeletal Exam: Cavus foot type noted with increased dorsiflexion of the ankle joint.  Compromised plantarflexion against resistance compared to the contralateral limb  Radiographic Exam RT foot 10/28/2022:  Normal osseous mineralization.  No osseous erosions noted.  Plantar heel spur noted which does not seem to contribute to the plantar ulcer.  Steep increased calcaneal inclination angle noted with calcifications along the Achilles tendon mid substance both alluding to history of Achilles compromise or possible Achilles tendon rupture  MR FOOT RIGHT WO CONTRAST 12/07/2019: Achilles: Thickening of the distal Achilles tendon measuring 16 mm in AP dimension with intermediate intrasubstance signal and a moderate partial-thickness insertional tear which measures 2.2 cm in transverse dimension (series 5, image 15; series 8, images 8-11). There are multiple ossifications within the distal tendon. No full-thickness Achilles tendon tear. No retrocalcaneal bursitis or peritendinous edema.  IMPRESSION: 1. Negative for acute osteomyelitis. 2. Soft tissue ulceration and cellulitis of the right heel underlying the posterior calcaneus without discrete sinus tract or fluid collection extending to the underlying cortex. No abnormal marrow signal within the calcaneus. 3. Distal Achilles tendinosis with moderate partial-thickness insertional tear. 4. Distal tibialis posterior tendinosis. 5. Findings suggesting chronic plantar fasciitis.  Assessment: 1.  Ulcer plantar heel right 2.  Achilles compromise right with this foot type   Plan of Care:   -Patient evaluated.   -Medically necessary excisional debridement including subcutaneous tissue was performed using a tissue nipper.  Excisional debridement of the necrotic nonviable tissue down to healthier bleeding viable tissue was performed with postdebridement measurement same as pre-. - Resume daily dressing changes at home -Continue custom molded insoles with heel offloaded.  Additional quarter inch felt 'horseshoe' type padding was applied to the heel to add additional offloading to the central portion of the heel -Return to clinic 4 weeks     Thresa EMERSON Sar, DPM [facility]  Dr. Thresa EMERSON Sar, DPM    [address]  [place], [place] [address]                Office [phone]  Fax [phone]

## 2024-02-25 ENCOUNTER — Ambulatory Visit (INDEPENDENT_AMBULATORY_CARE_PROVIDER_SITE_OTHER): Admitting: Podiatry

## 2024-02-25 ENCOUNTER — Encounter: Payer: Self-pay | Admitting: Podiatry

## 2024-02-25 DIAGNOSIS — B351 Tinea unguium: Secondary | ICD-10-CM | POA: Diagnosis not present

## 2024-02-25 DIAGNOSIS — M79676 Pain in unspecified toe(s): Secondary | ICD-10-CM

## 2024-02-25 DIAGNOSIS — I739 Peripheral vascular disease, unspecified: Secondary | ICD-10-CM

## 2024-02-27 NOTE — Progress Notes (Signed)
  Subjective:  Patient ID: Terry Vaughn, male    DOB: Feb 27, 1933,  MRN: [id number]  Terry Vaughn presents to clinic today for painful elongated mycotic toenails 1-5 bilaterally which are tender when wearing enclosed shoe gear. Pain is relieved with periodic professional debridement.  Chief Complaint  Patient presents with   RFC    Rm2 Routine foot care/ Dr. Dineen last visit October 2025   New problem(s): None.   PCP is [name], Rollene MATSU, FNP.  Allergies  Allergen Reactions   Codeine Other (See Comments)    Alters mental state    Review of Systems: Negative except as noted in the HPI.  Objective: No changes noted in today's physical examination. There were no vitals filed for this visit. Terry Vaughn is a pleasant 89 y.o. male morbidly obese in NAD. AAO x 3.  Vascular Examination: Capillary refill time immediate b/l. Palpable pedal pulses. Pedal hair absent b/l. Pedal edema present b/l. No pain with calf compression b/l. Skin temperature gradient WNL b/l. No cyanosis or clubbing b/l. No ischemia or gangrene noted b/l.   Neurological Examination: Sensation grossly intact b/l with 10 gram monofilament. Vibratory sensation intact b/l.   Dermatological Examination: Pedal skin with normal turgor, texture and tone b/l.  No open wounds. No interdigital macerations.   Toenails 1-5 b/l thick, discolored, elongated with subungual debris and pain on dorsal palpation.   Preulcerative lesion noted plantar heel pad of right foot. There is visible subdermal hemorrhage. There is no surrounding erythema, no edema, no drainage, no odor, no fluctuance.  Musculoskeletal Examination: Muscle strength 5/5 to all lower extremity muscle groups bilaterally. No pain, crepitus or joint limitation noted with ROM bilateral LE. No gross bony deformities bilaterally.  Radiographs: None  Assessment/Plan: 1. Pain due to onychomycosis of toenail   2. PVD (peripheral vascular disease) (HCC)      Patient was evaluated and treated. All patient's and/or POA's questions/concerns addressed on today's visit. Toenails 1-5 debrided in length and girth without incident. Continue foot and shoe inspections daily. Right heel managed by Dr. Janit.  Monitor blood glucose per PCP/Endocrinologist's recommendations. Continue soft, supportive shoe gear daily. Report any pedal injuries to medical professional. Call office if there are any questions/concerns. Return in about 3 months (around 05/27/2024).  Delon LITTIE Merlin, DPM      [facility] LOCATION: [address], [place] [address]                   Office [phone]   [facility] LOCATION: [address] [place], [place] [address] Office [phone]

## 2024-03-08 ENCOUNTER — Encounter: Payer: Self-pay | Admitting: Podiatry

## 2024-03-08 ENCOUNTER — Ambulatory Visit (INDEPENDENT_AMBULATORY_CARE_PROVIDER_SITE_OTHER): Admitting: Podiatry

## 2024-03-08 DIAGNOSIS — L97412 Non-pressure chronic ulcer of right heel and midfoot with fat layer exposed: Secondary | ICD-10-CM

## 2024-03-08 MED ORDER — DOXYCYCLINE HYCLATE 100 MG PO TABS
100.0000 mg | ORAL_TABLET | Freq: Two times a day (BID) | ORAL | 0 refills | Status: DC
Start: 2024-03-08 — End: 2024-06-16

## 2024-03-08 MED ORDER — GENTAMICIN SULFATE 0.1 % EX OINT: 1.0000 | TOPICAL_OINTMENT | Freq: Two times a day (BID) | CUTANEOUS | 1 refills | Status: AC | PRN

## 2024-03-08 NOTE — Progress Notes (Signed)
 Chief Complaint  Patient presents with   Wound Check    Pt is here to f/u on right heel he states some pain due to having callous shaved off on his last visit, states no other complaints.    HPI: 89 y.o. male presenting today for routine management of a chronic right heel ulcer.  Patient's son states that he has been doing a significant amount of ambulating and walking which is healthy for him.  Unfortunately the wound to the plantar heel has returned  Past Medical History:  Diagnosis Date   Bone spur 1988   left heel   Bursitis 1987   right shoulder   Cellulitis 1986   right leg   Pneumonia    noted 07/17/17 CXR    Shingles 1959   Urinary retention    UTI (urinary tract infection)    sepsis E coli UTI +urine and blood cultures 07/17/17     Past Surgical History:  Procedure Laterality Date   APPENDECTOMY  1942   CHOLECYSTECTOMY  1966   EYE SURGERY  2015   catarcts extracted   IRRIGATION AND DEBRIDEMENT FOOT Right 12/09/2019   Procedure: IRRIGATION AND DEBRIDEMENT HEEL;  Surgeon: Lennie Barter, DPM;  Location: [facility] ORS;  Service: Podiatry;  Laterality: Right;   JOINT REPLACEMENT     right hip 20 + years from 2019, left hip replaced 05/01/17    MELANOMA EXCISION  04.2010   mole removal right arm   TONSILLECTOMY AND ADENOIDECTOMY  1944   TOTAL HIP ARTHROPLASTY  1999   right hip    Allergies  Allergen Reactions   Codeine Other (See Comments)    Alters mental state    Physical Exam: General: The patient is alert and oriented x3 in no acute distress.  Dermatology: Ulcer noted right plantar heel measuring approximately 0.3 x 0.4 x 0.2 cm.  Granular wound base.  Periwound is callused.  The wound does not probe to bone.  There is no obvious exposed bone.  Serosanguineous drainage.  Mild malodor.  Wound base demonstrates necrotic tissue.  Vascular: Palpable pedal pulses bilaterally. Capillary refill within normal limits.  Negative for any significant edema or erythema.   Clinically there is no concern for vascular compromise  VAS US  ABI W/WO TBI 06/24/2022 ABI Findings:  +---------+------------------+-----+---------+--------+  Right   Rt Pressure (mmHg)IndexWaveform Comment   +---------+------------------+-----+---------+--------+  Brachial 141                                       +---------+------------------+-----+---------+--------+  PTA     239               1.70 triphasic          +---------+------------------+-----+---------+--------+  DP      227               1.61 triphasic          +---------+------------------+-----+---------+--------+  Great Toe169               1.20 Normal             +---------+------------------+-----+---------+--------+   +---------+------------------+-----+---------+-------+  Left    Lt Pressure (mmHg)IndexWaveform Comment  +---------+------------------+-----+---------+-------+  Brachial 140                                      +---------+------------------+-----+---------+-------+  PTA     255               1.81 triphasic         +---------+------------------+-----+---------+-------+  DP      236               1.67 triphasic         +---------+------------------+-----+---------+-------+  Great Toe191               1.35 Normal            +---------+------------------+-----+---------+-------+   +-------+----------------+-----------+----------------+------------+  ABI/TBIToday's ABI     Today's TBIPrevious ABI    Previous TBI  +-------+----------------+-----------+----------------+------------+  Right Non compressible1.20       Non compressible0.77          +-------+----------------+-----------+----------------+------------+  Left  Non compressible1.35       Non compressible0.88          +-------+----------------+-----------+----------------+------------+  Bilateral ABIs appear essentially unchanged compared to prior study on  06/25/2021.     Summary:  Right: Resting right ankle-brachial index indicates noncompressible right  lower extremity arteries. The right toe-brachial index is normal.   Left: Resting left ankle-brachial index is within normal range. The left  toe-brachial index is normal.    Neurological: Light touch and protective threshold grossly intact  Musculoskeletal Exam: Cavus foot type noted with increased dorsiflexion of the ankle joint.  Compromised plantarflexion against resistance compared to the contralateral limb  Radiographic Exam RT foot 10/28/2022:  Normal osseous mineralization.  No osseous erosions noted.  Plantar heel spur noted which does not seem to contribute to the plantar ulcer.  Steep increased calcaneal inclination angle noted with calcifications along the Achilles tendon mid substance both alluding to history of Achilles compromise or possible Achilles tendon rupture  MR FOOT RIGHT WO CONTRAST 12/07/2019: Achilles: Thickening of the distal Achilles tendon measuring 16 mm in AP dimension with intermediate intrasubstance signal and a moderate partial-thickness insertional tear which measures 2.2 cm in transverse dimension (series 5, image 15; series 8, images 8-11). There are multiple ossifications within the distal tendon. No full-thickness Achilles tendon tear. No retrocalcaneal bursitis or peritendinous edema.  IMPRESSION: 1. Negative for acute osteomyelitis. 2. Soft tissue ulceration and cellulitis of the right heel underlying the posterior calcaneus without discrete sinus tract or fluid collection extending to the underlying cortex. No abnormal marrow signal within the calcaneus. 3. Distal Achilles tendinosis with moderate partial-thickness insertional tear. 4. Distal tibialis posterior tendinosis. 5. Findings suggesting chronic plantar fasciitis.  Assessment: 1.  Ulcer plantar heel right 2.  Achilles compromise right with this foot type   Plan of Care:  -Patient evaluated.    -Medically necessary excisional debridement including subcutaneous tissue was performed using a tissue nipper.  Excisional debridement of the necrotic nonviable tissue down to healthier bleeding viable tissue was performed with postdebridement measurement same as pre-. - Due to the necrotic tissue at the wound base in addition to the mild malodor I did go ahead and take a culture sent to pathology for C&S -Prescription for doxycycline  100 mg twice daily x 10 days -Prescription for gentamicin  ointment apply twice daily -The custom molded orthotics were adjusted today and new offloading felt padding was applied to the insole of the shoe to offload pressure from the heel -Return to clinic 2 weeks     Thresa EMERSON Sar, DPM [facility]  Dr. Thresa EMERSON Sar, DPM    [address]  [place], [place] [address]                Office [phone]  Fax [phone]

## 2024-03-08 NOTE — Addendum Note (Signed)
 Addended by: NICHOLAUS MARJORIE SAILOR on: 03/08/2024 04:51 PM   Modules accepted: Orders

## 2024-03-14 LAB — WOUND CULTURE
MICRO NUMBER:: 16826092
SPECIMEN QUALITY:: ADEQUATE

## 2024-03-22 ENCOUNTER — Ambulatory Visit (INDEPENDENT_AMBULATORY_CARE_PROVIDER_SITE_OTHER): Admitting: Podiatry

## 2024-03-22 ENCOUNTER — Encounter: Payer: Self-pay | Admitting: Podiatry

## 2024-03-22 VITALS — Ht 72.0 in | Wt 270.4 lb

## 2024-03-22 DIAGNOSIS — L97412 Non-pressure chronic ulcer of right heel and midfoot with fat layer exposed: Secondary | ICD-10-CM

## 2024-03-22 NOTE — Progress Notes (Signed)
 Chief Complaint  Patient presents with   Wound Check    Pt is here to f/u on right heel, states its about the same, some drainage when I took the bandage off.    HPI: 89 y.o. male presenting today for routine management of a chronic right heel ulcer.  Patient's son states that he has been doing a significant amount of ambulating and walking which is healthy for him.  Unfortunately the wound to the plantar heel has returned  Past Medical History:  Diagnosis Date   Bone spur 1988   left heel   Bursitis 1987   right shoulder   Cellulitis 1986   right leg   Pneumonia    noted 07/17/17 CXR    Shingles 1959   Urinary retention    UTI (urinary tract infection)    sepsis E coli UTI +urine and blood cultures 07/17/17     Past Surgical History:  Procedure Laterality Date   APPENDECTOMY  1942   CHOLECYSTECTOMY  1966   EYE SURGERY  2015   catarcts extracted   IRRIGATION AND DEBRIDEMENT FOOT Right 12/09/2019   Procedure: IRRIGATION AND DEBRIDEMENT HEEL;  Surgeon: Lennie Barter, DPM;  Location: [facility] ORS;  Service: Podiatry;  Laterality: Right;   JOINT REPLACEMENT     right hip 20 + years from 2019, left hip replaced 05/01/17    MELANOMA EXCISION  04.2010   mole removal right arm   TONSILLECTOMY AND ADENOIDECTOMY  1944   TOTAL HIP ARTHROPLASTY  1999   right hip    Allergies  Allergen Reactions   Codeine Other (See Comments)    Alters mental state    RT heel 03/22/2024  Physical Exam: General: The patient is alert and oriented x3 in no acute distress.  Dermatology: Ulcer noted right plantar heel measuring approximately 0.3 x 0.4 x 0.2 cm.  Granular wound base.  Periwound is callused.  The wound does not probe to bone.  There is no obvious exposed bone.  Serosanguineous drainage.  Mild malodor.  Wound base demonstrates necrotic tissue.  Vascular: Palpable pedal pulses bilaterally. Capillary refill within normal limits.  Negative for any significant edema or erythema.   Clinically there is no concern for vascular compromise  VAS US  ABI W/WO TBI 06/24/2022 ABI Findings:  +---------+------------------+-----+---------+--------+  Right   Rt Pressure (mmHg)IndexWaveform Comment   +---------+------------------+-----+---------+--------+  Brachial 141                                       +---------+------------------+-----+---------+--------+  PTA     239               1.70 triphasic          +---------+------------------+-----+---------+--------+  DP      227               1.61 triphasic          +---------+------------------+-----+---------+--------+  Great Toe169               1.20 Normal             +---------+------------------+-----+---------+--------+   +---------+------------------+-----+---------+-------+  Left    Lt Pressure (mmHg)IndexWaveform Comment  +---------+------------------+-----+---------+-------+  Brachial 140                                      +---------+------------------+-----+---------+-------+  PTA     255               1.81 triphasic         +---------+------------------+-----+---------+-------+  DP      236               1.67 triphasic         +---------+------------------+-----+---------+-------+  Great Toe191               1.35 Normal            +---------+------------------+-----+---------+-------+   +-------+----------------+-----------+----------------+------------+  ABI/TBIToday's ABI     Today's TBIPrevious ABI    Previous TBI  +-------+----------------+-----------+----------------+------------+  Right Non compressible1.20       Non compressible0.77          +-------+----------------+-----------+----------------+------------+  Left  Non compressible1.35       Non compressible0.88          +-------+----------------+-----------+----------------+------------+  Bilateral ABIs appear essentially unchanged compared to prior study on  06/25/2021.     Summary:  Right: Resting right ankle-brachial index indicates noncompressible right  lower extremity arteries. The right toe-brachial index is normal.   Left: Resting left ankle-brachial index is within normal range. The left  toe-brachial index is normal.    Neurological: Light touch and protective threshold grossly intact  Musculoskeletal Exam: Cavus foot type noted with increased dorsiflexion of the ankle joint.  Compromised plantarflexion against resistance compared to the contralateral limb  Radiographic Exam RT foot 10/28/2022:  Normal osseous mineralization.  No osseous erosions noted.  Plantar heel spur noted which does not seem to contribute to the plantar ulcer.  Steep increased calcaneal inclination angle noted with calcifications along the Achilles tendon mid substance both alluding to history of Achilles compromise or possible Achilles tendon rupture  MR FOOT RIGHT WO CONTRAST 12/07/2019: Achilles: Thickening of the distal Achilles tendon measuring 16 mm in AP dimension with intermediate intrasubstance signal and a moderate partial-thickness insertional tear which measures 2.2 cm in transverse dimension (series 5, image 15; series 8, images 8-11). There are multiple ossifications within the distal tendon. No full-thickness Achilles tendon tear. No retrocalcaneal bursitis or peritendinous edema.  IMPRESSION: 1. Negative for acute osteomyelitis. 2. Soft tissue ulceration and cellulitis of the right heel underlying the posterior calcaneus without discrete sinus tract or fluid collection extending to the underlying cortex. No abnormal marrow signal within the calcaneus. 3. Distal Achilles tendinosis with moderate partial-thickness insertional tear. 4. Distal tibialis posterior tendinosis. 5. Findings suggesting chronic plantar fasciitis.  Assessment: 1.  Ulcer plantar heel right 2.  Achilles compromise right with this foot type   Plan of Care:  -Patient evaluated.   Cultures reviewed -Medically necessary excisional debridement including subcutaneous tissue was performed using a tissue nipper.  Excisional debridement of the necrotic nonviable tissue down to healthier bleeding viable tissue was performed with postdebridement measurement same as pre-. - Overall significant improvement.  He has completed the oral doxycycline  -Continue gentamicin  cream topical -Return to clinic 3 weeks   Thresa EMERSON Sar, DPM [facility]  Dr. Thresa EMERSON Sar, DPM    [address] [place] [address]                Office [phone]  Fax [phone]

## 2024-04-12 ENCOUNTER — Ambulatory Visit (INDEPENDENT_AMBULATORY_CARE_PROVIDER_SITE_OTHER): Admitting: Podiatry

## 2024-04-12 DIAGNOSIS — L97412 Non-pressure chronic ulcer of right heel and midfoot with fat layer exposed: Secondary | ICD-10-CM | POA: Diagnosis not present

## 2024-04-12 NOTE — Progress Notes (Signed)
 Chief Complaint  Patient presents with   Foot Pain    3 wk f/u right foot ulcer. No pain.     HPI: 89 y.o. male presenting today for routine management of a chronic right heel ulcer.   Past Medical History:  Diagnosis Date   Bone spur 1988   left heel   Bursitis 1987   right shoulder   Cellulitis 1986   right leg   Pneumonia    noted 07/17/17 CXR    Shingles 1959   Urinary retention    UTI (urinary tract infection)    sepsis E coli UTI +urine and blood cultures 07/17/17     Past Surgical History:  Procedure Laterality Date   APPENDECTOMY  1942   CHOLECYSTECTOMY  1966   EYE SURGERY  2015   catarcts extracted   IRRIGATION AND DEBRIDEMENT FOOT Right 12/09/2019   Procedure: IRRIGATION AND DEBRIDEMENT HEEL;  Surgeon: Lennie Barter, DPM;  Location: [facility];  Service: Podiatry;  Laterality: Right;   JOINT REPLACEMENT     right hip 20 + years from 2019, left hip replaced 05/01/17    MELANOMA EXCISION  04.2010   mole removal right arm   TONSILLECTOMY AND ADENOIDECTOMY  1944   TOTAL HIP ARTHROPLASTY  1999   right hip    Allergies  Allergen Reactions   Codeine Other (See Comments)    Alters mental state    RT heel 03/22/2024  Physical Exam: General: The patient is alert and oriented x3 in no acute distress.  Dermatology: Improved.  Ulcer noted right plantar heel measuring approximately 0.3 x 0.3 x 0.2 cm.  Granular wound base.  Periwound is callused.  The wound does not probe to bone.  There is no obvious exposed bone.  No drainage.  Healthy granular tissue noted  Vascular: Palpable pedal pulses bilaterally. Capillary refill within normal limits.  Negative for any significant edema or erythema.  Clinically there is no concern for vascular compromise  VAS US  ABI W/WO TBI 06/24/2022 ABI Findings:  +---------+------------------+-----+---------+--------+  Right   Rt Pressure (mmHg)IndexWaveform Comment   +---------+------------------+-----+---------+--------+   Brachial 141                                       +---------+------------------+-----+---------+--------+  PTA     239               1.70 triphasic          +---------+------------------+-----+---------+--------+  DP      227               1.61 triphasic          +---------+------------------+-----+---------+--------+  Great Toe169               1.20 Normal             +---------+------------------+-----+---------+--------+   +---------+------------------+-----+---------+-------+  Left    Lt Pressure (mmHg)IndexWaveform Comment  +---------+------------------+-----+---------+-------+  Brachial 140                                      +---------+------------------+-----+---------+-------+  PTA     255               1.81 triphasic         +---------+------------------+-----+---------+-------+  DP      236  1.67 triphasic         +---------+------------------+-----+---------+-------+  [place] Toe191               1.35 Normal            +---------+------------------+-----+---------+-------+   +-------+----------------+-----------+----------------+------------+  ABI/TBIToday's ABI     Today's TBIPrevious ABI    Previous TBI  +-------+----------------+-----------+----------------+------------+  Right Non compressible1.20       Non compressible0.77          +-------+----------------+-----------+----------------+------------+  Left  Non compressible1.35       Non compressible0.88          +-------+----------------+-----------+----------------+------------+  Bilateral ABIs appear essentially unchanged compared to prior study on  06/25/2021.    Summary:  Right: Resting right ankle-brachial index indicates noncompressible right  lower extremity arteries. The right toe-brachial index is normal.   Left: Resting left ankle-brachial index is within normal range. The left  toe-brachial index is normal.     Neurological: Light touch and protective threshold grossly intact  Musculoskeletal Exam: Cavus foot type noted with increased dorsiflexion of the ankle joint.  Compromised plantarflexion against resistance compared to the contralateral limb  Radiographic Exam RT foot 10/28/2022:  Normal osseous mineralization.  No osseous erosions noted.  Plantar heel spur noted which does not seem to contribute to the plantar ulcer.  Steep increased calcaneal inclination angle noted with calcifications along the Achilles tendon mid substance both alluding to history of Achilles compromise or possible Achilles tendon rupture  MR FOOT RIGHT WO CONTRAST 12/07/2019: Achilles: Thickening of the distal Achilles tendon measuring 16 mm in AP dimension with intermediate intrasubstance signal and a moderate partial-thickness insertional tear which measures 2.2 cm in transverse dimension (series 5, image 15; series 8, images 8-11). There are multiple ossifications within the distal tendon. No full-thickness Achilles tendon tear. No retrocalcaneal bursitis or peritendinous edema.  IMPRESSION: 1. Negative for acute osteomyelitis. 2. Soft tissue ulceration and cellulitis of the right heel underlying the posterior calcaneus without discrete sinus tract or fluid collection extending to the underlying cortex. No abnormal marrow signal within the calcaneus. 3. Distal Achilles tendinosis with moderate partial-thickness insertional tear. 4. Distal tibialis posterior tendinosis. 5. Findings suggesting chronic plantar fasciitis.  Assessment: 1.  Ulcer plantar heel right 2.  Achilles compromise right w/ dorsiflexed foot   Plan of Care:  -Patient evaluated.  -Medically necessary excisional debridement including subcutaneous tissue was performed using a tissue nipper.  Excisional debridement of the necrotic nonviable tissue down to healthier bleeding viable tissue was performed with postdebridement measurement same as  pre-. -Continue gentamicin  cream topical with offloading quarter inch felt padding -Return to clinic 4 weeks   Thresa EMERSON Sar, DPM [facility]  Dr. Thresa EMERSON Sar, DPM    [address] [place], [place] [address]                Office [phone]  Fax [phone]

## 2024-05-10 ENCOUNTER — Encounter: Payer: Self-pay | Admitting: Podiatry

## 2024-05-10 ENCOUNTER — Ambulatory Visit (INDEPENDENT_AMBULATORY_CARE_PROVIDER_SITE_OTHER): Admitting: Podiatry

## 2024-05-10 VITALS — Ht 72.0 in | Wt 270.4 lb

## 2024-05-10 DIAGNOSIS — L97412 Non-pressure chronic ulcer of right heel and midfoot with fat layer exposed: Secondary | ICD-10-CM

## 2024-05-10 NOTE — Progress Notes (Signed)
 Chief Complaint  Patient presents with   Foot Ulcer    Pt is here to f/u on right heel due to ulcer, he states no complaints everything is still the same.    HPI: 89 y.o. male presenting today for routine management of a chronic right heel ulcer.   Past Medical History:  Diagnosis Date   Bone spur 1988   left heel   Bursitis 1987   right shoulder   Cellulitis 1986   right leg   Pneumonia    noted 07/17/17 CXR    Shingles 1959   Urinary retention    UTI (urinary tract infection)    sepsis E coli UTI +urine and blood cultures 07/17/17     Past Surgical History:  Procedure Laterality Date   APPENDECTOMY  1942   CHOLECYSTECTOMY  1966   EYE SURGERY  2015   catarcts extracted   IRRIGATION AND DEBRIDEMENT FOOT Right 12/09/2019   Procedure: IRRIGATION AND DEBRIDEMENT HEEL;  Surgeon: Lennie Barter, DPM;  Location: [facility];  Service: Podiatry;  Laterality: Right;   JOINT REPLACEMENT     right hip 20 + years from 2019, left hip replaced 05/01/17    MELANOMA EXCISION  04.2010   mole removal right arm   TONSILLECTOMY AND ADENOIDECTOMY  1944   TOTAL HIP ARTHROPLASTY  1999   right hip    Allergies  Allergen Reactions   Codeine Other (See Comments)    Alters mental state    RT heel 03/22/2024  Physical Exam: General: The patient is alert and oriented x3 in no acute distress.  Dermatology: Improved.  Ulcer noted right plantar heel measuring approximately 0.3 x 0.3 x 0.2 cm.  Granular wound base.  Periwound is callused.  The wound does not probe to bone.  There is no obvious exposed bone.  No drainage.  Healthy granular tissue noted  Vascular: Palpable pedal pulses bilaterally. Capillary refill within normal limits.  Negative for any significant edema or erythema.  Clinically there is no concern for vascular compromise  VAS US  ABI W/WO TBI 06/24/2022 ABI Findings:  +---------+------------------+-----+---------+--------+  Right   Rt Pressure (mmHg)IndexWaveform Comment    +---------+------------------+-----+---------+--------+  Brachial 141                                       +---------+------------------+-----+---------+--------+  PTA     239               1.70 triphasic          +---------+------------------+-----+---------+--------+  DP      227               1.61 triphasic          +---------+------------------+-----+---------+--------+  Great Toe169               1.20 Normal             +---------+------------------+-----+---------+--------+   +---------+------------------+-----+---------+-------+  Left    Lt Pressure (mmHg)IndexWaveform Comment  +---------+------------------+-----+---------+-------+  Brachial 140                                      +---------+------------------+-----+---------+-------+  PTA     255               1.81 triphasic         +---------+------------------+-----+---------+-------+  DP      236               1.67 triphasic         +---------+------------------+-----+---------+-------+  Great Toe191               1.35 Normal            +---------+------------------+-----+---------+-------+   +-------+----------------+-----------+----------------+------------+  ABI/TBIToday's ABI     Today's TBIPrevious ABI    Previous TBI  +-------+----------------+-----------+----------------+------------+  Right Non compressible1.20       Non compressible0.77          +-------+----------------+-----------+----------------+------------+  Left  Non compressible1.35       Non compressible0.88          +-------+----------------+-----------+----------------+------------+  Bilateral ABIs appear essentially unchanged compared to prior study on  06/25/2021.    Summary:  Right: Resting right ankle-brachial index indicates noncompressible right  lower extremity arteries. The right toe-brachial index is normal.   Left: Resting left ankle-brachial index is within  normal range. The left  toe-brachial index is normal.    Neurological: Light touch and protective threshold grossly intact  Musculoskeletal Exam: Cavus foot type noted with increased dorsiflexion of the ankle joint.  Compromised plantarflexion against resistance compared to the contralateral limb  Radiographic Exam RT foot 10/28/2022:  Normal osseous mineralization.  No osseous erosions noted.  Plantar heel spur noted which does not seem to contribute to the plantar ulcer.  Steep increased calcaneal inclination angle noted with calcifications along the Achilles tendon mid substance both alluding to history of Achilles compromise or possible Achilles tendon rupture  MR FOOT RIGHT WO CONTRAST 12/07/2019: Achilles: Thickening of the distal Achilles tendon measuring 16 mm in AP dimension with intermediate intrasubstance signal and a moderate partial-thickness insertional tear which measures 2.2 cm in transverse dimension (series 5, image 15; series 8, images 8-11). There are multiple ossifications within the distal tendon. No full-thickness Achilles tendon tear. No retrocalcaneal bursitis or peritendinous edema.  IMPRESSION: 1. Negative for acute osteomyelitis. 2. Soft tissue ulceration and cellulitis of the right heel underlying the posterior calcaneus without discrete sinus tract or fluid collection extending to the underlying cortex. No abnormal marrow signal within the calcaneus. 3. Distal Achilles tendinosis with moderate partial-thickness insertional tear. 4. Distal tibialis posterior tendinosis. 5. Findings suggesting chronic plantar fasciitis.  Assessment: 1.  Ulcer plantar heel right 2.  Achilles compromise right w/ dorsiflexed foot   Plan of Care:  -Patient evaluated.  -Medically necessary excisional debridement including subcutaneous tissue was performed using a tissue nipper.  Excisional debridement of the necrotic nonviable tissue down to healthier bleeding viable tissue  was performed with postdebridement measurement same as pre-. -Continue gentamicin  cream topical with offloading quarter inch felt padding -Return to clinic 4 weeks   Thresa EMERSON Sar, DPM [facility]  Dr. Thresa EMERSON Sar, DPM    [address] [place], [place] [address]                Office [phone]  Fax [phone]

## 2024-05-18 ENCOUNTER — Ambulatory Visit: Admitting: *Deleted

## 2024-05-18 VITALS — Ht 73.0 in | Wt 270.0 lb

## 2024-05-18 DIAGNOSIS — Z0000 Encounter for general adult medical examination without abnormal findings: Secondary | ICD-10-CM

## 2024-05-18 NOTE — Progress Notes (Signed)
 Subjective:   Terry Vaughn is a 89 y.o. who presents for a Medicare Wellness preventive visit.  As a reminder, Annual Wellness Visits don't include a physical exam, and some assessments may be limited, especially if this visit is performed virtually. We may recommend an in-person follow-up visit with your provider if needed.  Visit Complete: Virtual I connected with  Terry Vaughn on 05/18/24 by a audio enabled telemedicine application and verified that I am speaking with the correct person using two identifiers.  Patient Location: Home  Provider Location: Home Office  I discussed the limitations of evaluation and management by telemedicine. The patient expressed understanding and agreed to proceed.  Vital Signs: Because this visit was a virtual/telehealth visit, some criteria may be missing or patient reported. Any vitals not documented were not able to be obtained and vitals that have been documented are patient reported.  VideoDeclined- This patient declined Librarian, academic. Therefore the visit was completed with audio only.  Persons Participating in Visit: Patient.  AWV Questionnaire: No: Patient Medicare AWV questionnaire was not completed prior to this visit.  Cardiac Risk Factors include: advanced age (>52men, >61 women);hypertension;male gender;obesity (BMI >30kg/m2);sedentary lifestyle     Objective:    Today's Vitals   05/18/24 1536  Weight: 270 lb (122.5 kg)  Height: 6' 1 (1.854 m)   Body mass index is 35.62 kg/m.     05/18/2024    3:54 PM 05/18/2024    3:51 PM 05/07/2023    1:47 PM 12/29/2022   12:16 PM 05/02/2022    1:06 PM 04/24/2021   11:25 AM 04/23/2020   11:13 AM  Advanced Directives  Does Patient Have a Medical Advance Directive? Yes No Yes Yes Yes Yes Yes  Type of Estate agent of [place];Living will  Healthcare Power of [place];Living will  Healthcare Power of [place];Living will Healthcare  Power of [place];Living will Healthcare Power of [place];Living will  Does patient want to make changes to medical advance directive? No - Patient declined  No - Patient declined  No - Patient declined No - Patient declined No - Patient declined  Copy of Healthcare Power of Attorney in Chart? Yes - validated most recent copy scanned in chart (See row information)  Yes - validated most recent copy scanned in chart (See row information)  Yes - validated most recent copy scanned in chart (See row information) Yes - validated most recent copy scanned in chart (See row information) Yes - validated most recent copy scanned in chart (See row information)  Would patient like information on creating a medical advance directive? No - Patient declined No - Patient declined         Current Medications (verified) Outpatient Encounter Medications as of 05/18/2024  Medication Sig   diclofenac Sodium (VOLTAREN ARTHRITIS PAIN) 1 % GEL Apply 2 g topically 4 (four) times daily.   docusate sodium  (COLACE) 100 MG capsule Take 100 mg by mouth daily as needed for mild constipation.   finasteride  (PROSCAR ) 5 MG tablet Take 1 tablet (5 mg total) by mouth daily.   gentamicin  ointment (GARAMYCIN ) 0.1 % Apply 1 Application topically 2 (two) times daily as needed.   losartan (COZAAR) 25 MG tablet    tamsulosin  (FLOMAX ) 0.4 MG CAPS capsule Take 1 capsule (0.4 mg total) by mouth daily.   doxycycline  (VIBRA -TABS) 100 MG tablet Take 1 tablet (100 mg total) by mouth 2 (two) times daily. (Patient not taking: Reported on 05/18/2024)   No  facility-administered encounter medications on file as of 05/18/2024.    Allergies (verified) Codeine   History: Past Medical History:  Diagnosis Date   Bone spur 1988   left heel   Bursitis 1987   right shoulder   Cellulitis 1986   right leg   Pneumonia    noted 07/17/17 CXR    Shingles 1959   Urinary retention    UTI (urinary tract infection)    sepsis E coli UTI +urine and  blood cultures 07/17/17    Past Surgical History:  Procedure Laterality Date   APPENDECTOMY  1942   CHOLECYSTECTOMY  1966   EYE SURGERY  2015   catarcts extracted   IRRIGATION AND DEBRIDEMENT FOOT Right 12/09/2019   Procedure: IRRIGATION AND DEBRIDEMENT HEEL;  Surgeon: Lennie Barter, DPM;  Location: [facility] ORS;  Service: Podiatry;  Laterality: Right;   JOINT REPLACEMENT     right hip 20 + years from 2019, left hip replaced 05/01/17    MELANOMA EXCISION  04.2010   mole removal right arm   TONSILLECTOMY AND ADENOIDECTOMY  1944   TOTAL HIP ARTHROPLASTY  1999   right hip   Family History  Problem Relation Age of Onset   Clotting disorder Mother    Heart disease Father 51   Cancer Sister        breast   Cerebral aneurysm Brother        [place]   Cancer Daughter        breast   Social History   Socioeconomic History   Marital status: Married    Spouse name: Pat   Number of children: 3   Years of education: 16   Highest education level: Some college, no degree  Occupational History   Occupation: retired  Tobacco Use   Smoking status: Never   Smokeless tobacco: Never  Vaping Use   Vaping status: Never Used  Substance and Sexual Activity   Alcohol use: No    Alcohol/week: 0.0 standard drinks of alcohol   Drug use: No   Sexual activity: Not Currently  Other Topics Concern   Not on file  Social History Narrative   Lives in [place] with wife.   Diet - regular   Exercise - none   Social Drivers of Health   Financial Resource Strain: Low Risk  (05/18/2024)   Overall Financial Resource Strain (CARDIA)    Difficulty of Paying Living Expenses: Not hard at all  Food Insecurity: No Food Insecurity (05/18/2024)   Hunger Vital Sign    Worried About Running Out of Food in the Last Year: Never true    Ran Out of Food in the Last Year: Never true  Transportation Needs: No Transportation Needs (05/18/2024)   PRAPARE - Administrator, Civil Service (Medical):  No    Lack of Transportation (Non-Medical): No  Physical Activity: Inactive (05/18/2024)   Exercise Vital Sign    Days of Exercise per Week: 0 days    Minutes of Exercise per Session: 0 min  Stress: No Stress Concern Present (05/18/2024)   [employer] of Occupational Health - Occupational Stress Questionnaire    Feeling of Stress: Only a little  Social Connections: Moderately Isolated (05/18/2024)   Social Connection and Isolation Panel    Frequency of Communication with Friends and Family: More than three times a week    Frequency of Social Gatherings with Friends and Family: More than three times a week    Attends Religious Services: Never  Active Member of Clubs or Organizations: No    Attends Banker Meetings: Never    Marital Status: Married    Tobacco Counseling Counseling given: Not Answered    Clinical Intake:  Pre-visit preparation completed: Yes  Pain : No/denies pain     BMI - recorded: 35.62 Nutritional Status: BMI > 30  Obese Nutritional Risks: None Diabetes: No  Lab Results  Component Value Date   HGBA1C 5.3 05/22/2021   HGBA1C 5.4 12/07/2019   HGBA1C 5.1 04/15/2017     How often do you need to have someone help you when you read instructions, pamphlets, or other written materials from your doctor or pharmacy?: 1 - Never  Interpreter Needed?: No  Information entered by :: R. Louellen Haldeman LPN   Activities of Daily Living     05/18/2024    3:38 PM  In your present state of health, do you have any difficulty performing the following activities:  Hearing? 1  Vision? 0  Difficulty concentrating or making decisions? 0  Walking or climbing stairs? 1  Dressing or bathing? 0  Doing errands, shopping? 1  Preparing Food and eating ? N  Using the Toilet? N  In the past six months, have you accidently leaked urine? N  Do you have problems with loss of bowel control? N  Managing your Medications? Y  Managing your Finances? N   Housekeeping or managing your Housekeeping? Y    Patient Care Team: Dineen Rollene MATSU, FNP as PCP - General (Family Medicine) Janit Thresa HERO, DPM as Consulting Physician (Podiatry) Mittie Gaskin, MD as Referring Physician (Ophthalmology) Marcelino Gales, MD (Nephrology) Drake Chew, PA-C as Physician Assistant (Orthopedic Surgery) Marea Selinda RAMAN, MD as Referring Physician (Vascular Surgery) Penne Knee, MD (Inactive) as Consulting Physician (Urology)  I have updated your Care Teams any recent Medical Services you may have received from other providers in the past year.     Assessment:   This is a routine wellness examination for Terry Vaughn.  Hearing/Vision screen Hearing Screening - Comments:: Some issues Vision Screening - Comments:: readers   Goals Addressed             This Visit's Progress    Patient Stated       Wants to get his foot problem cleared        Depression Screen     05/18/2024    3:45 PM 05/07/2023    1:46 PM 05/23/2022    3:13 PM 05/02/2022    1:05 PM 04/24/2021   11:29 AM 04/23/2020   11:14 AM 01/24/2020    8:30 AM  PHQ 2/9 Scores  PHQ - 2 Score 0 0 0 0 0 0 0  PHQ- 9 Score 1 0 0        Fall Risk     05/18/2024    3:41 PM 05/28/2023    3:52 PM 05/07/2023    1:49 PM 05/23/2022    3:13 PM 05/02/2022    1:06 PM  Fall Risk   Falls in the past year? 0 0 0 0 0  Number falls in past yr: 0 0 0 0 0  Injury with Fall? 0 0 0 0 0  Risk for fall due to : No Fall Risks No Fall Risks Impaired mobility;Impaired balance/gait No Fall Risks Impaired balance/gait  Risk for fall due to: Comment     walker in use when ambulating  Follow up Falls evaluation completed;Falls prevention discussed Falls evaluation completed Falls prevention discussed;Education provided;Falls evaluation  completed Falls evaluation completed  Falls evaluation completed      Data saved with a previous flowsheet row definition    MEDICARE RISK AT HOME:  Medicare Risk at  Home Any stairs in or around the home?: Yes If so, are there any without handrails?: No Home free of loose throw rugs in walkways, pet beds, electrical cords, etc?: Yes Adequate lighting in your home to reduce risk of falls?: Yes Life alert?: No Use of a cane, walker or w/c?: Yes Grab bars in the bathroom?: Yes Shower chair or bench in shower?: Yes Elevated toilet seat or a handicapped toilet?: Yes  TIMED UP AND GO:  Was the test performed?  No  Cognitive Function: 6CIT completed    11/28/2015    1:32 PM  MMSE - Mini Mental State Exam  Orientation to time 5   Orientation to Place 5   Registration 3   Attention/ Calculation 5   Recall 3   Language- name 2 objects 2   Language- repeat 1  Language- follow 3 step command 3   Language- read & follow direction 1   Write a sentence 1   Copy design 1   Total score 30      Data saved with a previous flowsheet row definition        05/18/2024    3:55 PM 05/07/2023    1:51 PM 05/02/2022    1:19 PM 04/24/2021   11:32 AM 04/23/2020   11:21 AM  6CIT Screen  What Year? 0 points 0 points 0 points 0 points 0 points  What month? 0 points 0 points 0 points 0 points 0 points  What time? 0 points 0 points 0 points 0 points 0 points  Count back from 20 0 points 0 points 0 points 0 points   Months in reverse 0 points 0 points 0 points 0 points   Repeat phrase 4 points 0 points 0 points 0 points   Total Score 4 points 0 points 0 points 0 points     Immunizations Immunization History  Administered Date(s) Administered   Fluad Quad(high Dose 65+) 05/23/2022   Fluad Trivalent(High Dose 65+) 05/28/2023   Hepatitis B 07/06/1992   INFLUENZA, HIGH DOSE SEASONAL PF 04/15/2017, 05/31/2018, 04/28/2019   Influenza Nasal 03/29/2011   Influenza Split 05/02/2012, 05/11/2014   Influenza Whole 05/28/2008   Influenza-Unspecified 05/06/2016, 06/28/2020, 05/18/2021   PFIZER(Purple Top)SARS-COV-2 Vaccination 08/12/2019, 09/02/2019   PPD Test  12/13/2019, 12/27/2019   Pfizer Covid-19 Vaccine Bivalent Booster 5y-11y 05/18/2021   Pneumococcal Conjugate-13 03/24/2014   Pneumococcal Polysaccharide-23 05/28/2000   Td 05/28/2008   Tdap 12/27/2010   Zoster, Live 05/29/2007    Screening Tests Health Maintenance  Topic Date Due   Zoster Vaccines- Shingrix (1 of 2) 10/26/1984   DTaP/Tdap/Td (3 - Td or Tdap) 12/26/2020   Influenza Vaccine  02/26/2024   COVID-19 Vaccine (4 - 2025-26 season) 03/28/2024   Medicare Annual Wellness (AWV)  05/06/2024   Pneumococcal Vaccine: 50+ Years  Completed   Meningococcal B Vaccine  Aged Out   Hepatitis B Vaccines 19-59 Average Risk  Discontinued    Health Maintenance Items Addressed: Discussed the need to update flu, tetanus, shingles and covid vaccines  Additional Screening:  Vision Screening: Recommended annual ophthalmology exams for early detection of glaucoma and other disorders of the eye. Is the patient up to date with their annual eye exam?  Yes  Who is the provider or what is the name of the office in which  the patient attends annual eye exams?  [facility]  Dental Screening: Recommended annual dental exams for proper oral hygiene  Community Resource Referral / Chronic Care Management: CRR required this visit?  No   CCM required this visit?  No   Plan:    I have personally reviewed and noted the following in the patient's chart:   Medical and social history Use of alcohol, tobacco or illicit drugs  Current medications and supplements including opioid prescriptions. Patient is not currently taking opioid prescriptions. Functional ability and status Nutritional status Physical activity Advanced directives List of other physicians Hospitalizations, surgeries, and ER visits in previous 12 months Vitals Screenings to include cognitive, depression, and falls Referrals and appointments  In addition, I have reviewed and discussed with patient certain preventive protocols,  quality metrics, and best practice recommendations. A written personalized care plan for preventive services as well as general preventive health recommendations were provided to patient.   Angeline Fredericks, LPN   [id number]   After Visit Summary: (Pick Up) Due to this being a telephonic visit, with patients personalized plan was offered to patient and patient has requested to Pick up at office.  Notes: Nothing significant to report at this time.

## 2024-05-18 NOTE — Patient Instructions (Signed)
 Terry Vaughn,  Thank you for taking the time for your Medicare Wellness Visit. I appreciate your continued commitment to your health goals. Please review the care plan we discussed, and feel free to reach out if I can assist you further.  Medicare recommends these wellness visits once per year to help you and your care team stay ahead of potential health issues. These visits are designed to focus on prevention, allowing your provider to concentrate on managing your acute and chronic conditions during your regular appointments.  Please note that Annual Wellness Visits do not include a physical exam. Some assessments may be limited, especially if the visit was conducted virtually. If needed, we may recommend a separate in-person follow-up with your provider.  Ongoing Care Seeing your primary care provider every 3 to 6 months helps us  monitor your health and provide consistent, personalized care.  Remember to update your flu, tetanus, shingles and covid vaccines.  Referrals If a referral was made during today's visit and you haven't received any updates within two weeks, please contact the referred provider directly to check on the status.  Recommended Screenings:  Health Maintenance  Topic Date Due   Zoster (Shingles) Vaccine (1 of 2) 10/26/1984   DTaP/Tdap/Td vaccine (3 - Td or Tdap) 12/26/2020   Flu Shot  02/26/2024   COVID-19 Vaccine (4 - 2025-26 season) 03/28/2024   Medicare Annual Wellness Visit  05/18/2025   Pneumococcal Vaccine for age over 21  Completed   Meningitis B Vaccine  Aged Out   Hepatitis B Vaccine  Discontinued       05/18/2024    3:54 PM  Advanced Directives  Does Patient Have a Medical Advance Directive? Yes  Type of Estate agent of [place];Living will  Does patient want to make changes to medical advance directive? No - Patient declined  Copy of Healthcare Power of Attorney in Chart? Yes - validated most recent copy scanned in chart (See row  information)  Would patient like information on creating a medical advance directive? No - Patient declined   Advance Care Planning is important because it: Ensures you receive medical care that aligns with your values, goals, and preferences. Provides guidance to your family and loved ones, reducing the emotional burden of decision-making during critical moments.  Vision: Annual vision screenings are recommended for early detection of glaucoma, cataracts, and diabetic retinopathy. These exams can also reveal signs of chronic conditions such as diabetes and high blood pressure.  Dental: Annual dental screenings help detect early signs of oral cancer, gum disease, and other conditions linked to overall health, including heart disease and diabetes.  Please see the attached documents for additional preventive care recommendations.

## 2024-06-02 ENCOUNTER — Ambulatory Visit: Admitting: Podiatry

## 2024-06-02 ENCOUNTER — Encounter: Payer: Self-pay | Admitting: Podiatry

## 2024-06-02 ENCOUNTER — Encounter: Payer: Medicare Other | Admitting: Family

## 2024-06-02 DIAGNOSIS — B351 Tinea unguium: Secondary | ICD-10-CM

## 2024-06-02 DIAGNOSIS — M79676 Pain in unspecified toe(s): Secondary | ICD-10-CM

## 2024-06-02 DIAGNOSIS — I739 Peripheral vascular disease, unspecified: Secondary | ICD-10-CM

## 2024-06-07 ENCOUNTER — Ambulatory Visit: Admitting: Podiatry

## 2024-06-07 ENCOUNTER — Encounter: Payer: Self-pay | Admitting: Podiatry

## 2024-06-07 VITALS — Ht 73.0 in | Wt 270.0 lb

## 2024-06-07 DIAGNOSIS — L97412 Non-pressure chronic ulcer of right heel and midfoot with fat layer exposed: Secondary | ICD-10-CM

## 2024-06-07 NOTE — Progress Notes (Signed)
 Chief Complaint  Patient presents with   Wound Check    Pt iis here to f/u on ulcer to the right heel, states everything is going fair, has no complaints.    HPI: 89 y.o. male presenting today for routine management of a chronic right heel ulcer.   Past Medical History:  Diagnosis Date   Bone spur 1988   left heel   Bursitis 1987   right shoulder   Cellulitis 1986   right leg   Pneumonia    noted 07/17/17 CXR    Shingles 1959   Urinary retention    UTI (urinary tract infection)    sepsis E coli UTI +urine and blood cultures 07/17/17     Past Surgical History:  Procedure Laterality Date   APPENDECTOMY  1942   CHOLECYSTECTOMY  1966   EYE SURGERY  2015   catarcts extracted   IRRIGATION AND DEBRIDEMENT FOOT Right 12/09/2019   Procedure: IRRIGATION AND DEBRIDEMENT HEEL;  Surgeon: Lennie Barter, DPM;  Location: [facility] ORS;  Service: Podiatry;  Laterality: Right;   JOINT REPLACEMENT     right hip 20 + years from 2019, left hip replaced 05/01/17    MELANOMA EXCISION  04.2010   mole removal right arm   TONSILLECTOMY AND ADENOIDECTOMY  1944   TOTAL HIP ARTHROPLASTY  1999   right hip    Allergies  Allergen Reactions   Codeine Other (See Comments)    Alters mental state    RT heel 03/22/2024  Physical Exam: General: The patient is alert and oriented x3 in no acute distress.  Dermatology: Stable.  Ulcer noted right plantar heel measuring approximately 0.3 x 0.3 x 0.2 cm.  Granular wound base.  Periwound is callused.  The wound does not probe to bone.  There is no obvious exposed bone.  No drainage.  Healthy granular tissue noted  Vascular: Palpable pedal pulses bilaterally. Capillary refill within normal limits.  Negative for any significant edema or erythema.  Clinically there is no concern for vascular compromise  VAS US  ABI W/WO TBI 06/24/2022 ABI Findings:  +---------+------------------+-----+---------+--------+  Right   Rt Pressure (mmHg)IndexWaveform Comment    +---------+------------------+-----+---------+--------+  Brachial 141                                       +---------+------------------+-----+---------+--------+  PTA     239               1.70 triphasic          +---------+------------------+-----+---------+--------+  DP      227               1.61 triphasic          +---------+------------------+-----+---------+--------+  Great Toe169               1.20 Normal             +---------+------------------+-----+---------+--------+   +---------+------------------+-----+---------+-------+  Left    Lt Pressure (mmHg)IndexWaveform Comment  +---------+------------------+-----+---------+-------+  Brachial 140                                      +---------+------------------+-----+---------+-------+  PTA     255               1.81 triphasic         +---------+------------------+-----+---------+-------+  DP      236               1.67 triphasic         +---------+------------------+-----+---------+-------+  Great Toe191               1.35 Normal            +---------+------------------+-----+---------+-------+   +-------+----------------+-----------+----------------+------------+  ABI/TBIToday's ABI     Today's TBIPrevious ABI    Previous TBI  +-------+----------------+-----------+----------------+------------+  Right Non compressible1.20       Non compressible0.77          +-------+----------------+-----------+----------------+------------+  Left  Non compressible1.35       Non compressible0.88          +-------+----------------+-----------+----------------+------------+  Bilateral ABIs appear essentially unchanged compared to prior study on  06/25/2021.    Summary:  Right: Resting right ankle-brachial index indicates noncompressible right  lower extremity arteries. The right toe-brachial index is normal.   Left: Resting left ankle-brachial index is within normal  range. The left  toe-brachial index is normal.    Neurological: Light touch and protective threshold grossly intact  Musculoskeletal Exam: Cavus foot type noted with increased dorsiflexion of the ankle joint.  Compromised plantarflexion against resistance compared to the contralateral limb  Radiographic Exam RT foot 10/28/2022:  Normal osseous mineralization.  No osseous erosions noted.  Plantar heel spur noted which does not seem to contribute to the plantar ulcer.  Steep increased calcaneal inclination angle noted with calcifications along the Achilles tendon mid substance both alluding to history of Achilles compromise or possible Achilles tendon rupture  MR FOOT RIGHT WO CONTRAST 12/07/2019: Achilles: Thickening of the distal Achilles tendon measuring 16 mm in AP dimension with intermediate intrasubstance signal and a moderate partial-thickness insertional tear which measures 2.2 cm in transverse dimension (series 5, image 15; series 8, images 8-11). There are multiple ossifications within the distal tendon. No full-thickness Achilles tendon tear. No retrocalcaneal bursitis or peritendinous edema.  IMPRESSION: 1. Negative for acute osteomyelitis. 2. Soft tissue ulceration and cellulitis of the right heel underlying the posterior calcaneus without discrete sinus tract or fluid collection extending to the underlying cortex. No abnormal marrow signal within the calcaneus. 3. Distal Achilles tendinosis with moderate partial-thickness insertional tear. 4. Distal tibialis posterior tendinosis. 5. Findings suggesting chronic plantar fasciitis.  Assessment: 1.  Ulcer plantar heel right 2.  Achilles compromise right w/ dorsiflexed foot   Plan of Care:  -Patient evaluated.  -Medically necessary excisional debridement including subcutaneous tissue was performed using a tissue nipper.  Excisional debridement of the necrotic nonviable tissue down to healthier bleeding viable tissue was  performed with postdebridement measurement same as pre-. -Continue gentamicin  cream topical with offloading quarter inch felt padding -Return to clinic 4 weeks   Thresa EMERSON Sar, DPM [facility]  Dr. Thresa EMERSON Sar, DPM    [address] [place], [place] [address]                Office [phone]  Fax [phone]

## 2024-06-10 ENCOUNTER — Other Ambulatory Visit (INDEPENDENT_AMBULATORY_CARE_PROVIDER_SITE_OTHER): Payer: Self-pay | Admitting: Nurse Practitioner

## 2024-06-10 DIAGNOSIS — L97519 Non-pressure chronic ulcer of other part of right foot with unspecified severity: Secondary | ICD-10-CM

## 2024-06-12 NOTE — Progress Notes (Signed)
  Subjective:  Patient ID: Terry Vaughn, male    DOB: 1933-05-08,  MRN: [id number]  Terry Vaughn presents to clinic today for at risk foot care. Patient has h/o PAD and painful mycotic toenails of both feet that are difficult to trim. Pain interferes with daily activities and wearing enclosed shoe gear comfortably. Patient is accompanied by his son on today's visit. Pt relates tenderness to right great toe inside border. Chief Complaint  Patient presents with   Nail Problem    Thick painful toenails, 3 month follow up    New problem(s): None.   PCP is [name], Rollene MATSU, FNP.  Allergies  Allergen Reactions   Codeine Other (See Comments)    Alters mental state    Review of Systems: Negative except as noted in the HPI.  Objective: No changes noted in today's physical examination. There were no vitals filed for this visit. Terry Vaughn is a pleasant 89 y.o. male morbidly obese in NAD. AAO x 3.  Vascular Examination: Capillary refill time immediate b/l. Palpable pedal pulses. Pedal hair absent b/l. Pedal edema present b/l. No pain with calf compression b/l. Skin temperature gradient WNL b/l. No cyanosis or clubbing b/l. No ischemia or gangrene noted b/l.   Neurological Examination: Sensation grossly intact b/l with 10 gram monofilament. Vibratory sensation intact b/l.   Dermatological Examination: Pedal skin with normal turgor, texture and tone b/l.  No open wounds. No interdigital macerations. Incurvated nailplate right great toe lateral border(s) with tenderness to palpation. No erythema, no edema, no drainage noted.  Toenails -5 b/l thick, discolored, elongated with subungual debris and pain on dorsal palpation.   Dressing noted plantar aspect right calcaneus which is clean, dry and intact with no odor.  Musculoskeletal Examination: Muscle strength 5/5 to all lower extremity muscle groups bilaterally. No pain, crepitus or joint limitation noted with ROM bilateral LE. No  gross bony deformities bilaterally.  Radiographs: None  Assessment/Plan: 1. Pain due to onychomycosis of toenail   2. PVD (peripheral vascular disease)   -Patient's family member present. All questions/concerns addressed on today's visit. -Consent given for treatment as described below: -Right heel ulcer managed by Dr. Janit. -Patient to continue soft, supportive shoe gear daily. -Mycotic toenails 2-5 bilaterally and left great toe were debrided in length and girth with sterile nail nippers and dremel without iatrogenic bleeding. -No invasive procedure(s) performed. Offending nail border debrided and curretaged right great toe utilizing sterile nail nipper and currette. Border cleansed with alcohol and triple antibiotic ointment applied. No further treatment required by patient/caregiver. Call office if there are any concerns. -Patient/POA to call should there be question/concern in the interim.   Return in about 3 months (around 09/02/2024).  Terry Vaughn, DPM      [facility] LOCATION: [address], [place] [address]                   Office [phone]   [facility] LOCATION: [address] [place], [place] [address] Office [phone]

## 2024-06-14 ENCOUNTER — Ambulatory Visit (INDEPENDENT_AMBULATORY_CARE_PROVIDER_SITE_OTHER): Payer: Medicare Other

## 2024-06-14 ENCOUNTER — Encounter (INDEPENDENT_AMBULATORY_CARE_PROVIDER_SITE_OTHER): Payer: Self-pay | Admitting: Nurse Practitioner

## 2024-06-14 ENCOUNTER — Ambulatory Visit (INDEPENDENT_AMBULATORY_CARE_PROVIDER_SITE_OTHER): Payer: Medicare Other | Admitting: Nurse Practitioner

## 2024-06-14 VITALS — BP 144/76 | HR 69 | Resp 16 | Wt 263.8 lb

## 2024-06-14 DIAGNOSIS — L97519 Non-pressure chronic ulcer of other part of right foot with unspecified severity: Secondary | ICD-10-CM

## 2024-06-14 DIAGNOSIS — I10 Essential (primary) hypertension: Secondary | ICD-10-CM

## 2024-06-14 DIAGNOSIS — M7989 Other specified soft tissue disorders: Secondary | ICD-10-CM | POA: Diagnosis not present

## 2024-06-15 LAB — VAS US ABI WITH/WO TBI

## 2024-06-16 ENCOUNTER — Ambulatory Visit: Payer: Self-pay | Admitting: Family

## 2024-06-16 ENCOUNTER — Encounter: Payer: Self-pay | Admitting: Family

## 2024-06-16 ENCOUNTER — Ambulatory Visit: Admitting: Family

## 2024-06-16 VITALS — BP 126/72 | HR 82 | Temp 97.3°F | Ht 73.0 in | Wt 260.2 lb

## 2024-06-16 DIAGNOSIS — Z136 Encounter for screening for cardiovascular disorders: Secondary | ICD-10-CM | POA: Diagnosis not present

## 2024-06-16 DIAGNOSIS — Z0000 Encounter for general adult medical examination without abnormal findings: Secondary | ICD-10-CM

## 2024-06-16 DIAGNOSIS — Z13220 Encounter for screening for lipoid disorders: Secondary | ICD-10-CM

## 2024-06-16 DIAGNOSIS — I10 Essential (primary) hypertension: Secondary | ICD-10-CM

## 2024-06-16 DIAGNOSIS — Z23 Encounter for immunization: Secondary | ICD-10-CM

## 2024-06-16 DIAGNOSIS — N1831 Chronic kidney disease, stage 3a: Secondary | ICD-10-CM | POA: Diagnosis not present

## 2024-06-16 LAB — COMPREHENSIVE METABOLIC PANEL WITH GFR
ALT: 20 U/L (ref 0–53)
AST: 20 U/L (ref 0–37)
Albumin: 4.1 g/dL (ref 3.5–5.2)
Alkaline Phosphatase: 53 U/L (ref 39–117)
BUN: 17 mg/dL (ref 6–23)
CO2: 32 meq/L (ref 19–32)
Calcium: 8.7 mg/dL (ref 8.4–10.5)
Chloride: 103 meq/L (ref 96–112)
Creatinine, Ser: 1.29 mg/dL (ref 0.40–1.50)
GFR: 49.11 mL/min — ABNORMAL LOW (ref >60.00)
Glucose, Bld: 85 mg/dL (ref 70–99)
Potassium: 4.4 meq/L (ref 3.5–5.1)
Sodium: 141 meq/L (ref 135–145)
Total Bilirubin: 2 mg/dL — ABNORMAL HIGH (ref 0.2–1.2)
Total Protein: 6.5 g/dL (ref 6.0–8.3)

## 2024-06-16 LAB — TSH: TSH: 2.15 u[IU]/mL (ref 0.35–5.50)

## 2024-06-16 LAB — LIPID PANEL
Cholesterol: 133 mg/dL (ref 0–200)
HDL: 44.1 mg/dL (ref >39.00)
LDL Cholesterol: 74 mg/dL (ref 0–99)
NonHDL: 89.12
Total CHOL/HDL Ratio: 3
Triglycerides: 76 mg/dL (ref 0.0–149.0)
VLDL: 15.2 mg/dL (ref 0.0–40.0)

## 2024-06-16 NOTE — Assessment & Plan Note (Signed)
 Chronic, stable.  Reviewed last nephrology note May of this year.  Will follow

## 2024-06-16 NOTE — Progress Notes (Signed)
 Assessment & Plan:  Stage 3a chronic kidney disease (CKD) (HCC) Assessment & Plan: Chronic, stable.  Reviewed last nephrology note May of this year.  Will follow   Primary hypertension Assessment & Plan: Excellent control. Continue losartan 25mg  qd  Orders: -     Comprehensive metabolic panel with GFR -     Lipid panel -     TSH  Encounter for lipid screening for cardiovascular disease -     Lipid panel  Routine physical examination Assessment & Plan: Exercise limited by mobility, chronic foot ulcer.  No longer screening for colon cancer, prostate cancer.  Encouraged annual skin exam with dermatology.  Provided phone number to call [facility] whom he previously had seen   Other orders -     Flu vaccine HIGH DOSE PF(Fluzone Trivalent)     Return precautions given.   Risks, benefits, and alternatives of the medications and treatment plan prescribed today were discussed, and patient expressed understanding.   Education regarding symptom management and diagnosis given to patient on AVS either electronically or printed.  No follow-ups on file.  Rollene Northern, FNP  Subjective:    Patient ID: Terry Vaughn, male    DOB: [birth year], 89 y.o.   MRN: [id number]  CC: Terry Vaughn is a 89 y.o. male who presents today for physical exam.    HPI: Accompanied by son today  Feels well today.  No new complaints     History of left arm skin lesion; he is isnt sure if melanoma. Previously followed with dermatology prior to 2020.   compliant losartan 25 mg daily  No cp, sob   follow up vascular 06/14/24  Follow-up nephrology 12/15/2023; maintained on losartan, Flomax , finasteride  for underlying BPH.  He continues to follow with podiatry, seen 06/02/2024, 06/07/2024 right heel ulcer. Colorectal  Cancer Screening: No longer screening for colon cancer.  Last colonoscopy 12/12/2011; unable to see report  Prostate Cancer Screening: No longer screening for prostate  cancer  Lung Cancer Screening: No 30 year pack year history and > 50 years to 80 years.  Immunizations       Tetanus - due        Pneumococcal - Complete Exercise: No regular exercise. Using a walker Alcohol use:  none Smoking/tobacco use: Nonsmoker.    Health Maintenance  Topic Date Due   Flu Shot  02/26/2024   COVID-19 Vaccine (4 - 2025-26 season) 03/28/2024   Zoster (Shingles) Vaccine (1 of 2) 09/16/2024*   DTaP/Tdap/Td vaccine (3 - Td or Tdap) 06/16/2025*   Medicare Annual Wellness Visit  05/18/2025   Pneumococcal Vaccine for age over 46  Completed   Meningitis B Vaccine  Aged Out   Hepatitis B Vaccine  Discontinued  *Topic was postponed. The date shown is not the original due date.     ALLERGIES: Codeine  Current Outpatient Medications on File Prior to Visit  Medication Sig Dispense Refill   diclofenac Sodium (VOLTAREN ARTHRITIS PAIN) 1 % GEL Apply 2 g topically 4 (four) times daily.     docusate sodium  (COLACE) 100 MG capsule Take 100 mg by mouth daily as needed for mild constipation.     finasteride  (PROSCAR ) 5 MG tablet Take 1 tablet (5 mg total) by mouth daily. 90 tablet 3   gentamicin  ointment (GARAMYCIN ) 0.1 % Apply 1 Application topically 2 (two) times daily as needed. 30 g 1   losartan (COZAAR) 25 MG tablet      tamsulosin  (FLOMAX ) 0.4 MG CAPS capsule  Take 1 capsule (0.4 mg total) by mouth daily. 90 capsule 3   No current facility-administered medications on file prior to visit.    Review of Systems  Constitutional:  Negative for chills and fever.  Respiratory:  Negative for cough.   Cardiovascular:  Negative for chest pain and palpitations.  Gastrointestinal:  Negative for nausea and vomiting.      Objective:    BP 126/72   Pulse 82   Temp (!) 97.3 F (36.3 C) (Oral)   Ht 6' 1 (1.854 m)   Wt 260 lb 3.2 oz (118 kg)   SpO2 99%   BMI 34.33 kg/m   BP Readings from Last 3 Encounters:  06/16/24 126/72  06/14/24 (!) 144/76  07/07/23 128/74   Wt  Readings from Last 3 Encounters:  06/16/24 260 lb 3.2 oz (118 kg)  06/14/24 263 lb 12.8 oz (119.7 kg)  06/07/24 270 lb (122.5 kg)    Physical Exam Vitals reviewed.  Constitutional:      Appearance: He is well-developed.  Cardiovascular:     Rate and Rhythm: Regular rhythm.     Heart sounds: Normal heart sounds.  Pulmonary:     Effort: Pulmonary effort is normal. No respiratory distress.     Breath sounds: Normal breath sounds. No wheezing, rhonchi or rales.  Skin:    General: Skin is warm and dry.  Neurological:     Mental Status: He is alert.  Psychiatric:        Speech: Speech normal.        Behavior: Behavior normal.

## 2024-06-16 NOTE — Assessment & Plan Note (Signed)
 Excellent control. Continue losartan 25mg  qd

## 2024-06-16 NOTE — Assessment & Plan Note (Signed)
 Exercise limited by mobility, chronic foot ulcer.  No longer screening for colon cancer, prostate cancer.  Encouraged annual skin exam with dermatology.  Provided phone number to call [facility] whom he previously had seen

## 2024-06-16 NOTE — Patient Instructions (Addendum)
 As discussed, an annual skin exam is very important.  Please call and make an appointment to be evaluated.  Options in [place]  include: [facility] [phone])  , Dr Chrystie  Health Maintenance, Male Adopting a healthy lifestyle and getting preventive care are important in promoting health and wellness. Ask your health care provider about: The right schedule for you to have regular tests and exams. Things you can do on your own to prevent diseases and keep yourself healthy. What should I know about diet, weight, and exercise? Eat a healthy diet  Eat a diet that includes plenty of vegetables, fruits, low-fat dairy products, and lean protein. Do not eat a lot of foods that are high in solid fats, added sugars, or sodium. Maintain a healthy weight Body mass index (BMI) is a measurement that can be used to identify possible weight problems. It estimates body fat based on height and weight. Your health care provider can help determine your BMI and help you achieve or maintain a healthy weight. Get regular exercise Get regular exercise. This is one of the most important things you can do for your health. Most adults should: Exercise for at least 150 minutes each week. The exercise should increase your heart rate and make you sweat (moderate-intensity exercise). Do strengthening exercises at least twice a week. This is in addition to the moderate-intensity exercise. Spend less time sitting. Even light physical activity can be beneficial. Watch cholesterol and blood lipids Have your blood tested for lipids and cholesterol at 88 years of age, then have this test every 5 years. You may need to have your cholesterol levels checked more often if: Your lipid or cholesterol levels are high. You are older than 88 years of age. You are at high risk for heart disease. What should I know about cancer screening? Many types of cancers can be detected early and may often be prevented.  Depending on your health history and family history, you may need to have cancer screening at various ages. This may include screening for: Colorectal cancer. Prostate cancer. Skin cancer. Lung cancer. What should I know about heart disease, diabetes, and high blood pressure? Blood pressure and heart disease High blood pressure causes heart disease and increases the risk of stroke. This is more likely to develop in people who have high blood pressure readings or are overweight. Talk with your health care provider about your target blood pressure readings. Have your blood pressure checked: Every 3-5 years if you are 21-3 years of age. Every year if you are 19 years old or older. If you are between the ages of 37 and 53 and are a current or former smoker, ask your health care provider if you should have a one-time screening for abdominal aortic aneurysm (AAA). Diabetes Have regular diabetes screenings. This checks your fasting blood sugar level. Have the screening done: Once every three years after age 52 if you are at a normal weight and have a low risk for diabetes. More often and at a younger age if you are overweight or have a high risk for diabetes. What should I know about preventing infection? Hepatitis B If you have a higher risk for hepatitis B, you should be screened for this virus. Talk with your health care provider to find out if you are at risk for hepatitis B infection. Hepatitis C Blood testing is recommended for: Everyone born from [birth year] through 1963. Anyone with known risk factors for hepatitis C. Sexually transmitted infections (  STIs) You should be screened each year for STIs, including gonorrhea and chlamydia, if: You are sexually active and are younger than 88 years of age. You are older than 88 years of age and your health care provider tells you that you are at risk for this type of infection. Your sexual activity has changed since you were last screened, and you are  at increased risk for chlamydia or gonorrhea. Ask your health care provider if you are at risk. Ask your health care provider about whether you are at high risk for HIV. Your health care provider may recommend a prescription medicine to help prevent HIV infection. If you choose to take medicine to prevent HIV, you should first get tested for HIV. You should then be tested every 3 months for as long as you are taking the medicine. Follow these instructions at home: Alcohol use Do not drink alcohol if your health care provider tells you not to drink. If you drink alcohol: Limit how much you have to 0-2 drinks a day. Know how much alcohol is in your drink. In the [place] one drink equals one 12 oz bottle of beer (355 mL), one 5 oz glass of wine (148 mL), or one 1 oz glass of hard liquor (44 mL). Lifestyle Do not use any products that contain nicotine or tobacco. These products include cigarettes, chewing tobacco, and vaping devices, such as e-cigarettes. If you need help quitting, ask your health care provider. Do not use street drugs. Do not share needles. Ask your health care provider for help if you need support or information about quitting drugs. General instructions Schedule regular health, dental, and eye exams. Stay current with your vaccines. Tell your health care provider if: You often feel depressed. You have ever been abused or do not feel safe at home. Summary Adopting a healthy lifestyle and getting preventive care are important in promoting health and wellness. Follow your health care provider's instructions about healthy diet, exercising, and getting tested or screened for diseases. Follow your health care provider's instructions on monitoring your cholesterol and blood pressure. This information is not intended to replace advice given to you by your health care provider. Make sure you discuss any questions you have with your health care provider. Document Revised: 12/03/2020  Document Reviewed: 12/03/2020 Elsevier Patient Education  2024 [employer].

## 2024-06-18 ENCOUNTER — Encounter (INDEPENDENT_AMBULATORY_CARE_PROVIDER_SITE_OTHER): Payer: Self-pay | Admitting: Nurse Practitioner

## 2024-06-18 NOTE — Addendum Note (Signed)
 Addended by: Sloan Galentine G on: 06/18/2024 07:37 AM   Modules accepted: Orders

## 2024-06-18 NOTE — Progress Notes (Signed)
 SUBJECTIVE:  Patient ID: Terry Vaughn, male    DOB: 1933/06/04, 89 y.o.   MRN: [id number] Chief Complaint  Patient presents with   Follow-up    1 year ABI  Concern of right leg and ankle swelling     Discussed the use of AI scribe software for clinical note transcription with the patient, who gave verbal consent to proceed.  History of Present Illness Terry Vaughn is an 89 year old male who presents with leg swelling and evaluation of blood flow to the foot. He was referred by Dr. Janit for evaluation of his chronic foot wound and leg swelling.  He has a chronic wound on his foot that has been slow to heal and requires regular debridement. His wife changes the bandage daily and notes that the wound is 'kind of closed up now.'  He experiences swelling in his foot, which has been ongoing for over a year. There is no recent injury to the leg, but he mentions a past surgery on the heel. The swelling persists despite elevating his leg.  He has a history of laser surgery on his leg approximately four years ago to address vein issues. He was informed that more veins might develop post-surgery.  Recent tests indicate that the blood flow to his toes has decreased slightly, with a measurement of 0.65, down from 0.91 previously.  He has noncompressible ABIs bilaterally.  His wife assists with his care, and his son helps with any questions he might have.     Results DIAGNOSTIC TBI Arterial blood flow: 0.65 (06/14/2024) Arterial blood flow: 0.91 (2023) Arterial blood flow: 1.20 (2023) Arterial blood flow: 0.77 (2022)  Past Medical History:  Diagnosis Date   Bone spur 1988   left heel   Bursitis 1987   right shoulder   Cellulitis 1986   right leg   Pneumonia    noted 07/17/17 CXR    Shingles 1959   Urinary retention    UTI (urinary tract infection)    sepsis E coli UTI +urine and blood cultures 07/17/17     Past Surgical History:  Procedure Laterality Date   APPENDECTOMY   1942   CHOLECYSTECTOMY  1966   EYE SURGERY  2015   catarcts extracted   IRRIGATION AND DEBRIDEMENT FOOT Right 12/09/2019   Procedure: IRRIGATION AND DEBRIDEMENT HEEL;  Surgeon: Lennie Barter, DPM;  Location: [facility] ORS;  Service: Podiatry;  Laterality: Right;   JOINT REPLACEMENT     right hip 20 + years from 2019, left hip replaced 05/01/17    MELANOMA EXCISION  10/2008   mole removal left arm;   TONSILLECTOMY AND ADENOIDECTOMY  1944   TOTAL HIP ARTHROPLASTY  1999   right hip    Social History   Socioeconomic History   Marital status: Married    Spouse name: Public Affairs Consultant   Number of children: 3   Years of education: 16   Highest education level: Some college, no degree  Occupational History   Occupation: retired  Tobacco Use   Smoking status: Never   Smokeless tobacco: Never  Vaping Use   Vaping status: Never Used  Substance and Sexual Activity   Alcohol use: No    Alcohol/week: 0.0 standard drinks of alcohol   Drug use: No   Sexual activity: Not Currently  Other Topics Concern   Not on file  Social History Narrative   Lives in [place] with wife.   Diet - regular   Exercise - none   Social  Drivers of Corporate Investment Banker Strain: Low Risk  (05/18/2024)   Overall Financial Resource Strain (CARDIA)    Difficulty of Paying Living Expenses: Not hard at all  Food Insecurity: No Food Insecurity (05/18/2024)   Hunger Vital Sign    Worried About Running Out of Food in the Last Year: Never true    Ran Out of Food in the Last Year: Never true  Transportation Needs: No Transportation Needs (05/18/2024)   PRAPARE - Administrator, Civil Service (Medical): No    Lack of Transportation (Non-Medical): No  Physical Activity: Inactive (05/18/2024)   Exercise Vital Sign    Days of Exercise per Week: 0 days    Minutes of Exercise per Session: 0 min  Stress: No Stress Concern Present (05/18/2024)   [employer] of Occupational Health - Occupational Stress  Questionnaire    Feeling of Stress: Only a little  Social Connections: Moderately Isolated (05/18/2024)   Social Connection and Isolation Panel    Frequency of Communication with Friends and Family: More than three times a week    Frequency of Social Gatherings with Friends and Family: More than three times a week    Attends Religious Services: Never    Database Administrator or Organizations: No    Attends Banker Meetings: Never    Marital Status: Married  Catering Manager Violence: Not At Risk (05/18/2024)   Humiliation, Afraid, Rape, and Kick questionnaire    Fear of Current or Ex-Partner: No    Emotionally Abused: No    Physically Abused: No    Sexually Abused: No    Family History  Problem Relation Age of Onset   Clotting disorder Mother    Heart disease Father 28   Cancer Sister        breast   Cerebral aneurysm Brother        hemmorage   Cancer Daughter        breast    Allergies  Allergen Reactions   Codeine Other (See Comments)    Alters mental state     Review of Systems   Review of Systems: Negative Unless Checked Constitutional: [] Weight loss  [] Fever  [] Chills Cardiac: [] Chest pain   []  Atrial Fibrillation  [] Palpitations   [] Shortness of breath when laying flat   [] Shortness of breath with exertion. [] Shortness of breath at rest Vascular:  [] Pain in legs with walking   [] Pain in legs with standing [] Pain in legs when laying flat   [] Claudication    [] Pain in feet when laying flat    [] History of DVT   [] Phlebitis   [x] Swelling in legs   [] Varicose veins   [] Non-healing ulcers Pulmonary:   [] Uses home oxygen   [] Productive cough   [] Hemoptysis   [] Wheeze  [] COPD   [] Asthma Neurologic:  [] Dizziness   [] Seizures  [] Blackouts [] History of stroke   [] History of TIA  [] Aphasia   [] Temporary Blindness   [] Weakness or numbness in arm   [] Weakness or numbness in leg Musculoskeletal:   [] Joint swelling   [] Joint pain   [] Low back pain  []  History of Knee  Replacement [] Arthritis [] back Surgeries  []  Spinal Stenosis    Hematologic:  [] Easy bruising  [] Easy bleeding   [] Hypercoagulable state   [] Anemic Gastrointestinal:  [] Diarrhea   [] Vomiting  [] Gastroesophageal reflux/heartburn   [] Difficulty swallowing. [] Abdominal pain Genitourinary:  [] Chronic kidney disease   [] Difficult urination  [] Anuric   [] Blood in urine [] Frequent urination  [] Burning  with urination   [] Hematuria Skin:  [] Rashes   [] Ulcers [] Wounds Psychological:  [] History of anxiety   []  History of major depression  []  Memory Difficulties      OBJECTIVE:     BP (!) 144/76   Pulse 69   Resp 16   Wt 263 lb 12.8 oz (119.7 kg)   BMI 34.80 kg/m   Physical Exam Vitals reviewed.  HENT:     Head: Normocephalic.  Cardiovascular:     Rate and Rhythm: Normal rate.  Pulmonary:     Effort: Pulmonary effort is normal.  Musculoskeletal:     Right lower leg: Edema present.  Skin:    General: Skin is warm and dry.  Neurological:     Mental Status: He is alert and oriented to person, place, and time.     Motor: Weakness present.     Gait: Gait abnormal.  Psychiatric:        Mood and Affect: Mood normal.        Behavior: Behavior normal.        Thought Content: Thought content normal.        Judgment: Judgment normal.    Physical Exam     CMP     Component Value Date/Time   NA 141 06/16/2024 1150   K 4.4 06/16/2024 1150   CL 103 06/16/2024 1150   CO2 32 06/16/2024 1150   GLUCOSE 85 06/16/2024 1150   BUN 17 06/16/2024 1150   CREATININE 1.29 06/16/2024 1150   CREATININE 1.44 (H) 05/23/2022 1538   CALCIUM 8.7 06/16/2024 1150   PROT 6.5 06/16/2024 1150   ALBUMIN 4.1 06/16/2024 1150   AST 20 06/16/2024 1150   ALT 20 06/16/2024 1150   ALKPHOS 53 06/16/2024 1150   BILITOT 2.0 (H) 06/16/2024 1150   GFR 49.11 (L) 06/16/2024 1150   GFRNONAA 56 (L) 12/29/2022 1218    VAS US  ABI WITH/WO TBI Result Date: 06/15/2024  LOWER EXTREMITY DOPPLER STUDY Patient Name:  Terry Vaughn  Date of Exam:   06/14/2024 Medical Rec #: [id number]       Accession #:    [id number] Date of Birth: May 21, 1933        Patient Gender: M Patient Age:   75 years Exam Location:  [facility] Procedure:      VAS US  ABI WITH/WO TBI Referring Phys: ORVIN DARING --------------------------------------------------------------------------------  Indications: Ulceration. High Risk Factors: Hypertension, no history of smoking.  Performing Technologist: Donnice Charnley RVT  Examination Guidelines: A complete evaluation includes at minimum, Doppler waveform signals and systolic blood pressure reading at the level of bilateral brachial, anterior tibial, and posterior tibial arteries, when vessel segments are accessible. Bilateral testing is considered an integral part of a complete examination. Photoelectric Plethysmograph (PPG) waveforms and toe systolic pressure readings are included as required and additional duplex testing as needed. Limited examinations for reoccurring indications may be performed as noted.  ABI Findings: +---------+------------------+-----+---------+--------+ Right    Rt Pressure (mmHg)IndexWaveform Comment  +---------+------------------+-----+---------+--------+ Brachial 165                                      +---------+------------------+-----+---------+--------+ PTA      255               1.55 triphasic         +---------+------------------+-----+---------+--------+ DP       250  1.52 triphasic         +---------+------------------+-----+---------+--------+ Great Toe108               0.65                   +---------+------------------+-----+---------+--------+ +---------+------------------+-----+---------+-------+ Left     Lt Pressure (mmHg)IndexWaveform Comment +---------+------------------+-----+---------+-------+ Brachial 161                                     +---------+------------------+-----+---------+-------+ PTA       255               1.55 triphasic        +---------+------------------+-----+---------+-------+ DP       250               1.52 triphasic        +---------+------------------+-----+---------+-------+ Great Toe191               1.16                  +---------+------------------+-----+---------+-------+ +-------+----------------+-----------+----------------+------------+ ABI/TBIToday's ABI     Today's TBIPrevious ABI    Previous TBI +-------+----------------+-----------+----------------+------------+ Right  Non compressible0.65       Non compressible0.91         +-------+----------------+-----------+----------------+------------+ Left   Non compressible1.16       Non compressible1.06         +-------+----------------+-----------+----------------+------------+  Bilateral ABIs appear essentially unchanged compared to prior study on 06/18/2023.  Summary: Right: Resting right ankle-brachial index indicates noncompressible right lower extremity arteries. The right toe-brachial index is abnormal.  Left: Resting left ankle-brachial index indicates noncompressible left lower extremity arteries. The left toe-brachial index is normal.  *See table(s) above for measurements and observations.  Electronically signed by Selinda Gu MD on 06/15/2024 at 9:54:06 AM.    Final        ASSESSMENT AND PLAN:  1. 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 Strong waveforms with dampening in larger toe. Ulcer healing slowly, currently closed. Angiogram considered if healing compromised. - Continue annual blood flow monitoring unless wound worsens. - Consider angiogram if wound healing is compromised.  2. Primary hypertension Continue antihypertensive medications as already ordered, these medications have been reviewed and there are no changes at this  time.  3. Leg swelling Right lower extremity edema secondary to chronic wound Edema likely due to inflammation from chronic wound and possible venous insufficiency post-laser surgery. Compression therapy challenging due to wound location. - Use compression wraps or sleeves for swelling. - Elevate leg to reduce swelling.     Current Outpatient Medications on File Prior to Visit  Medication Sig Dispense Refill   diclofenac Sodium (VOLTAREN ARTHRITIS PAIN) 1 % GEL Apply 2 g topically 4 (four) times daily.     docusate sodium  (COLACE) 100 MG capsule Take 100 mg by mouth daily as needed for mild constipation.     finasteride  (PROSCAR ) 5 MG tablet Take 1 tablet (5 mg total) by mouth daily. 90 tablet 3   gentamicin  ointment (GARAMYCIN ) 0.1 % Apply 1 Application topically 2 (two) times daily as needed. 30 g 1   losartan (COZAAR) 25 MG tablet      tamsulosin  (FLOMAX ) 0.4 MG CAPS capsule Take 1 capsule (0.4 mg total) by mouth daily. 90 capsule  3   No current facility-administered medications on file prior to visit.    There are no Patient Instructions on file for this visit. No follow-ups on file.   Jaimie Pippins E Hadley Soileau, NP  This note was completed with Office Manager.  Any errors are purely unintentional.

## 2024-06-27 NOTE — Assessment & Plan Note (Signed)
 on Flomax /finasteride   - previously on self-CIC due to chronic poor emptying  PVR 0cc today  Overall doing well, content with urinary habits, minimal symptoms and emptying adequately.  We will continue to monitor this presumed history of UTI although I do not see a urinalysis or culture data, I think it was performed through urgent care.  Otherwise return to clinic in 1 year for symptom check and PVR Flomax  and finasteride  refilled today

## 2024-06-27 NOTE — Progress Notes (Unsigned)
   06/30/2024 9:05 AM   Terry Vaughn 02/11/1933 [id number]  Reason for visit: Follow up BPH   HPI: 89 y.o. male, initial follow up with me today, previously seen by Dr. Penne in Nov 2024  Prior HPI: Hx of epididymoorchitis  Hx of BPH  - on Flomax /finasteride   - previously on self-CIC due to chronic poor emptying  Hx of chronic Right renal atrophy   Physical Exam: There were no vitals taken for this visit.   Constitutional:  Alert and oriented, No acute distress.  Laboratory Data:  Latest Reference Range & Units 06/16/24 11:50  Creatinine 0.40 - 1.50 mg/dL 8.70    Pertinent Imaging: N/A    Assessment & Plan:    BPH with urinary obstruction Assessment & Plan: on Flomax /finasteride   - previously on self-CIC due to chronic poor emptying        Penne JONELLE Skye, MD  [facility] [address] [place], [place] [address] [phone]

## 2024-06-29 ENCOUNTER — Ambulatory Visit: Payer: Self-pay | Admitting: Urology

## 2024-06-30 ENCOUNTER — Ambulatory Visit: Admitting: Urology

## 2024-06-30 VITALS — BP 145/74 | HR 85 | Ht 74.0 in | Wt 268.4 lb

## 2024-06-30 DIAGNOSIS — N138 Other obstructive and reflux uropathy: Secondary | ICD-10-CM | POA: Diagnosis not present

## 2024-06-30 DIAGNOSIS — N401 Enlarged prostate with lower urinary tract symptoms: Secondary | ICD-10-CM | POA: Diagnosis not present

## 2024-06-30 LAB — URINALYSIS, COMPLETE
Bilirubin, UA: NEGATIVE
Glucose, UA: NEGATIVE
Ketones, UA: NEGATIVE
Leukocytes,UA: NEGATIVE
Nitrite, UA: NEGATIVE
Protein,UA: NEGATIVE
RBC, UA: NEGATIVE
Specific Gravity, UA: 1.015 (ref 1.005–1.030)
Urobilinogen, Ur: 1 mg/dL (ref 0.2–1.0)
pH, UA: 6.5 (ref 5.0–7.5)

## 2024-06-30 LAB — MICROSCOPIC EXAMINATION

## 2024-06-30 LAB — BLADDER SCAN AMB NON-IMAGING

## 2024-06-30 MED ORDER — FINASTERIDE 5 MG PO TABS: 5.0000 mg | ORAL_TABLET | Freq: Every day | ORAL | 3 refills | Status: AC

## 2024-06-30 MED ORDER — TAMSULOSIN HCL 0.4 MG PO CAPS: 0.4000 mg | ORAL_CAPSULE | Freq: Every day | ORAL | 3 refills | Status: AC

## 2024-07-05 ENCOUNTER — Ambulatory Visit: Admitting: Podiatry

## 2024-07-08 ENCOUNTER — Encounter: Payer: Self-pay | Admitting: Podiatry

## 2024-07-08 ENCOUNTER — Ambulatory Visit: Admitting: Podiatry

## 2024-07-08 VITALS — Ht 74.0 in | Wt 268.0 lb

## 2024-07-08 DIAGNOSIS — L97412 Non-pressure chronic ulcer of right heel and midfoot with fat layer exposed: Secondary | ICD-10-CM

## 2024-07-08 NOTE — Progress Notes (Signed)
 Chief Complaint  Patient presents with   Foot Ulcer    Pt is here to f/u on right heel due to ulcer.    HPI: 89 y.o. male presenting today for routine management of a chronic right heel ulcer.   Past Medical History:  Diagnosis Date   Bone spur 1988   left heel   Bursitis 1987   right shoulder   Cellulitis 1986   right leg   Pneumonia    noted 07/17/17 CXR    Shingles 1959   Urinary retention    UTI (urinary tract infection)    sepsis E coli UTI +urine and blood cultures 07/17/17     Past Surgical History:  Procedure Laterality Date   APPENDECTOMY  1942   CHOLECYSTECTOMY  1966   EYE SURGERY  2015   catarcts extracted   IRRIGATION AND DEBRIDEMENT FOOT Right 12/09/2019   Procedure: IRRIGATION AND DEBRIDEMENT HEEL;  Surgeon: Lennie Barter, DPM;  Location: [facility] ORS;  Service: Podiatry;  Laterality: Right;   JOINT REPLACEMENT     right hip 20 + years from 2019, left hip replaced 05/01/17    MELANOMA EXCISION  10/2008   mole removal left arm;   TONSILLECTOMY AND ADENOIDECTOMY  1944   TOTAL HIP ARTHROPLASTY  1999   right hip    Allergies  Allergen Reactions   Codeine Other (See Comments)    Alters mental state    RT heel 03/22/2024  Physical Exam: General: The patient is alert and oriented x3 in no acute distress.  Dermatology: Stable.  Ulcer noted right plantar heel measuring approximately 0.3 x 0.3 x 0.2 cm.  Granular wound base.  Periwound is callused.  The wound does not probe to bone.  There is no obvious exposed bone.  No drainage.  Healthy granular tissue noted  Vascular: Palpable pedal pulses bilaterally. Capillary refill within normal limits.  Negative for any significant edema or erythema.  Clinically there is no concern for vascular compromise  VAS US  ABI W/WO TBI 06/24/2022 ABI Findings:  +---------+------------------+-----+---------+--------+  Right   Rt Pressure (mmHg)IndexWaveform Comment    +---------+------------------+-----+---------+--------+  Brachial 141                                       +---------+------------------+-----+---------+--------+  PTA     239               1.70 triphasic          +---------+------------------+-----+---------+--------+  DP      227               1.61 triphasic          +---------+------------------+-----+---------+--------+  Great Toe169               1.20 Normal             +---------+------------------+-----+---------+--------+   +---------+------------------+-----+---------+-------+  Left    Lt Pressure (mmHg)IndexWaveform Comment  +---------+------------------+-----+---------+-------+  Brachial 140                                      +---------+------------------+-----+---------+-------+  PTA     255               1.81 triphasic         +---------+------------------+-----+---------+-------+  DP      236  1.67 triphasic         +---------+------------------+-----+---------+-------+  [place] Toe191               1.35 Normal            +---------+------------------+-----+---------+-------+   +-------+----------------+-----------+----------------+------------+  ABI/TBIToday's ABI     Today's TBIPrevious ABI    Previous TBI  +-------+----------------+-----------+----------------+------------+  Right Non compressible1.20       Non compressible0.77          +-------+----------------+-----------+----------------+------------+  Left  Non compressible1.35       Non compressible0.88          +-------+----------------+-----------+----------------+------------+  Bilateral ABIs appear essentially unchanged compared to prior study on  06/25/2021.    Summary:  Right: Resting right ankle-brachial index indicates noncompressible right  lower extremity arteries. The right toe-brachial index is normal.   Left: Resting left ankle-brachial index is within normal  range. The left  toe-brachial index is normal.    Neurological: Light touch and protective threshold grossly intact  Musculoskeletal Exam: Cavus foot type noted with increased dorsiflexion of the ankle joint.  Compromised plantarflexion against resistance compared to the contralateral limb  Radiographic Exam RT foot 10/28/2022:  Normal osseous mineralization.  No osseous erosions noted.  Plantar heel spur noted which does not seem to contribute to the plantar ulcer.  Steep increased calcaneal inclination angle noted with calcifications along the Achilles tendon mid substance both alluding to history of Achilles compromise or possible Achilles tendon rupture  MR FOOT RIGHT WO CONTRAST 12/07/2019: Achilles: Thickening of the distal Achilles tendon measuring 16 mm in AP dimension with intermediate intrasubstance signal and a moderate partial-thickness insertional tear which measures 2.2 cm in transverse dimension (series 5, image 15; series 8, images 8-11). There are multiple ossifications within the distal tendon. No full-thickness Achilles tendon tear. No retrocalcaneal bursitis or peritendinous edema.  IMPRESSION: 1. Negative for acute osteomyelitis. 2. Soft tissue ulceration and cellulitis of the right heel underlying the posterior calcaneus without discrete sinus tract or fluid collection extending to the underlying cortex. No abnormal marrow signal within the calcaneus. 3. Distal Achilles tendinosis with moderate partial-thickness insertional tear. 4. Distal tibialis posterior tendinosis. 5. Findings suggesting chronic plantar fasciitis.  Assessment: 1.  Ulcer plantar heel right 2.  Achilles compromise right w/ dorsiflexed foot   Plan of Care:  -Patient evaluated.  -Medically necessary excisional debridement including subcutaneous tissue was performed using a tissue nipper.  Excisional debridement of the necrotic nonviable tissue down to healthier bleeding viable tissue was  performed with postdebridement measurement same as pre-. -Continue gentamicin  cream topical with offloading quarter inch felt padding -Return to clinic 4 weeks   Thresa EMERSON Sar, DPM [facility]  Dr. Thresa EMERSON Sar, DPM    [address] [place], [place] [address]                Office [phone]  Fax [phone]

## 2024-08-04 ENCOUNTER — Encounter: Payer: Self-pay | Admitting: Physician Assistant

## 2024-08-04 ENCOUNTER — Ambulatory Visit (INDEPENDENT_AMBULATORY_CARE_PROVIDER_SITE_OTHER): Admitting: Physician Assistant

## 2024-08-04 VITALS — BP 110/70 | HR 102 | Ht 74.0 in | Wt 267.7 lb

## 2024-08-04 DIAGNOSIS — R351 Nocturia: Secondary | ICD-10-CM

## 2024-08-04 LAB — MICROSCOPIC EXAMINATION: Bacteria, UA: NONE SEEN

## 2024-08-04 LAB — URINALYSIS, COMPLETE
Bilirubin, UA: NEGATIVE
Glucose, UA: NEGATIVE
Ketones, UA: NEGATIVE
Leukocytes,UA: NEGATIVE
Nitrite, UA: NEGATIVE
Protein,UA: NEGATIVE
Specific Gravity, UA: <1.005 — ABNORMAL LOW (ref 1.005–1.030)
Urobilinogen, Ur: 0.2 mg/dL (ref 0.2–1.0)
pH, UA: 6 (ref 5.0–7.5)

## 2024-08-04 LAB — BLADDER SCAN AMB NON-IMAGING

## 2024-08-04 NOTE — Progress Notes (Signed)
 "  08/04/2024 1:47 PM   Terry Vaughn 01/12/1933 [id number]  CC: Chief Complaint  Patient presents with   Urinary Frequency    With pain   HPI: Terry Vaughn is a 89 y.o. male with PMH BPH on Flomax  and finasteride  who previously CIC'ed, chronic right renal atrophy, and epididymoorchitis who presents today for evaluation of urinary frequency.   Today he reports sudden worsening in nocturia 3 nights ago that has subsequently improved.  He denies dysuria.  With his history of UTI, he wanted his urine checked to make sure there was not an infection going on.  In-office UA today positive for trace intact blood; urine microscopy pan negative. PVR 32mL.  PMH: Past Medical History:  Diagnosis Date   Bone spur 1988   left heel   Bursitis 1987   right shoulder   Cellulitis 1986   right leg   Pneumonia    noted 07/17/17 CXR    Shingles 1959   Urinary retention    UTI (urinary tract infection)    sepsis E coli UTI +urine and blood cultures 07/17/17     Surgical History: Past Surgical History:  Procedure Laterality Date   APPENDECTOMY  1942   CHOLECYSTECTOMY  1966   EYE SURGERY  2015   catarcts extracted   IRRIGATION AND DEBRIDEMENT FOOT Right 12/09/2019   Procedure: IRRIGATION AND DEBRIDEMENT HEEL;  Surgeon: Lennie Barter, DPM;  Location: [facility] ORS;  Service: Podiatry;  Laterality: Right;   JOINT REPLACEMENT     right hip 20 + years from 2019, left hip replaced 05/01/17    MELANOMA EXCISION  10/2008   mole removal left arm;   TONSILLECTOMY AND ADENOIDECTOMY  1944   TOTAL HIP ARTHROPLASTY  1999   right hip    Home Medications:  Allergies as of 08/04/2024       Reactions   Codeine Other (See Comments)   Alters mental state        Medication List        Accurate as of August 04, 2024  1:47 PM. If you have any questions, ask your nurse or doctor.          docusate sodium  100 MG capsule Commonly known as: COLACE Take 100 mg by mouth daily as needed for mild  constipation.   finasteride  5 MG tablet Commonly known as: PROSCAR  Take 1 tablet (5 mg total) by mouth daily.   gentamicin  ointment 0.1 % Commonly known as: GARAMYCIN  Apply 1 Application topically 2 (two) times daily as needed.   losartan 25 MG tablet Commonly known as: COZAAR   tamsulosin  0.4 MG Caps capsule Commonly known as: FLOMAX  Take 1 capsule (0.4 mg total) by mouth daily.   Voltaren Arthritis Pain 1 % Gel Generic drug: diclofenac Sodium Apply 2 g topically 4 (four) times daily.        Allergies:  Allergies[1]  Family History: Family History  Problem Relation Age of Onset   Clotting disorder Mother    Heart disease Father 4   Cancer Sister        breast   Cerebral aneurysm Brother        hemmorage   Cancer Daughter        breast    Social History:   reports that he has never smoked. He has never used smokeless tobacco. He reports that he does not drink alcohol and does not use drugs.  Physical Exam: BP 110/70   Pulse (!) 102   Ht  6' 2 (1.88 m)   Wt 267 lb 11.2 oz (121.4 kg)   BMI 34.37 kg/m   Constitutional:  Alert and oriented, no acute distress, nontoxic appearing HEENT: [place], AT Cardiovascular: No clubbing, cyanosis, or edema Respiratory: Normal respiratory effort, no increased work of breathing Skin: No rashes, bruises or suspicious lesions Neurologic: Grossly intact, no focal deficits, moving all 4 extremities Psychiatric: Normal mood and affect  Laboratory Data: Results for orders placed or performed in visit on 08/04/24  BLADDER SCAN AMB NON-IMAGING   Collection Time: 08/04/24  1:43 PM  Result Value Ref Range   Scan Result 32ml    Assessment & Plan:   1. Nocturia (Primary) UA bland, emptying appropriately.  This seems to be an isolated episode that is self resolving.  Question possible BLE edema as contributory.  No intervention indicated. - BLADDER SCAN AMB NON-IMAGING - Urinalysis, Complete   Return if symptoms worsen or fail to  improve.  Terry Hones, PA-C  [facility] [place] [address], [address] [place], [place] [address] [phone]     [1]  Allergies Allergen Reactions   Codeine Other (See Comments)    Alters mental state   "

## 2024-08-05 ENCOUNTER — Ambulatory Visit: Admitting: Podiatry

## 2024-08-05 ENCOUNTER — Encounter: Payer: Self-pay | Admitting: Podiatry

## 2024-08-05 VITALS — Ht 74.0 in | Wt 267.7 lb

## 2024-08-05 DIAGNOSIS — L97412 Non-pressure chronic ulcer of right heel and midfoot with fat layer exposed: Secondary | ICD-10-CM | POA: Diagnosis not present

## 2024-08-05 NOTE — Progress Notes (Signed)
 "  Chief Complaint  Patient presents with   Foot Ulcer    Pt is here to f/u on right foot due to ulcer on the heel, states no changes.    HPI: 89 y.o. male presenting today for routine management of a chronic right heel ulcer.   Past Medical History:  Diagnosis Date   Bone spur 1988   left heel   Bursitis 1987   right shoulder   Cellulitis 1986   right leg   Pneumonia    noted 07/17/17 CXR    Shingles 1959   Urinary retention    UTI (urinary tract infection)    sepsis E coli UTI +urine and blood cultures 07/17/17     Past Surgical History:  Procedure Laterality Date   APPENDECTOMY  1942   CHOLECYSTECTOMY  1966   EYE SURGERY  2015   catarcts extracted   IRRIGATION AND DEBRIDEMENT FOOT Right 12/09/2019   Procedure: IRRIGATION AND DEBRIDEMENT HEEL;  Surgeon: Lennie Barter, DPM;  Location: [facility];  Service: Podiatry;  Laterality: Right;   JOINT REPLACEMENT     right hip 20 + years from 2019, left hip replaced 05/01/17    MELANOMA EXCISION  10/2008   mole removal left arm;   TONSILLECTOMY AND ADENOIDECTOMY  1944   TOTAL HIP ARTHROPLASTY  1999   right hip    Allergies  Allergen Reactions   Codeine Other (See Comments)    Alters mental state    RT heel 03/22/2024  Physical Exam: General: The patient is alert and oriented x3 in no acute distress.  Dermatology: Stable.  Ulcer noted right plantar heel measuring approximately 0.3 x 0.3 x 0.2 cm.  Granular wound base.  Periwound is callused.  The wound does not probe to bone.  There is no obvious exposed bone.  No drainage.  Healthy granular tissue noted  Vascular: Palpable pedal pulses bilaterally. Capillary refill within normal limits.  Negative for any significant edema or erythema.  Clinically there is no concern for vascular compromise  VAS US  ABI W/WO TBI 06/24/2022 ABI Findings:  +---------+------------------+-----+---------+--------+  Right   Rt Pressure (mmHg)IndexWaveform Comment    +---------+------------------+-----+---------+--------+  Brachial 141                                       +---------+------------------+-----+---------+--------+  PTA     239               1.70 triphasic          +---------+------------------+-----+---------+--------+  DP      227               1.61 triphasic          +---------+------------------+-----+---------+--------+  Great Toe169               1.20 Normal             +---------+------------------+-----+---------+--------+   +---------+------------------+-----+---------+-------+  Left    Lt Pressure (mmHg)IndexWaveform Comment  +---------+------------------+-----+---------+-------+  Brachial 140                                      +---------+------------------+-----+---------+-------+  PTA     255               1.81 triphasic         +---------+------------------+-----+---------+-------+  DP  236               1.67 triphasic         +---------+------------------+-----+---------+-------+  Great Toe191               1.35 Normal            +---------+------------------+-----+---------+-------+   +-------+----------------+-----------+----------------+------------+  ABI/TBIToday's ABI     Today's TBIPrevious ABI    Previous TBI  +-------+----------------+-----------+----------------+------------+  Right Non compressible1.20       Non compressible0.77          +-------+----------------+-----------+----------------+------------+  Left  Non compressible1.35       Non compressible0.88          +-------+----------------+-----------+----------------+------------+  Bilateral ABIs appear essentially unchanged compared to prior study on  06/25/2021.    Summary:  Right: Resting right ankle-brachial index indicates noncompressible right  lower extremity arteries. The right toe-brachial index is normal.   Left: Resting left ankle-brachial index is within normal  range. The left  toe-brachial index is normal.    Neurological: Light touch and protective threshold grossly intact  Musculoskeletal Exam: Cavus foot type noted with increased dorsiflexion of the ankle joint.  Compromised plantarflexion against resistance compared to the contralateral limb  Radiographic Exam RT foot 10/28/2022:  Normal osseous mineralization.  No osseous erosions noted.  Plantar heel spur noted which does not seem to contribute to the plantar ulcer.  Steep increased calcaneal inclination angle noted with calcifications along the Achilles tendon mid substance both alluding to history of Achilles compromise or possible Achilles tendon rupture  MR FOOT RIGHT WO CONTRAST 12/07/2019: Achilles: Thickening of the distal Achilles tendon measuring 16 mm in AP dimension with intermediate intrasubstance signal and a moderate partial-thickness insertional tear which measures 2.2 cm in transverse dimension (series 5, image 15; series 8, images 8-11). There are multiple ossifications within the distal tendon. No full-thickness Achilles tendon tear. No retrocalcaneal bursitis or peritendinous edema.  IMPRESSION: 1. Negative for acute osteomyelitis. 2. Soft tissue ulceration and cellulitis of the right heel underlying the posterior calcaneus without discrete sinus tract or fluid collection extending to the underlying cortex. No abnormal marrow signal within the calcaneus. 3. Distal Achilles tendinosis with moderate partial-thickness insertional tear. 4. Distal tibialis posterior tendinosis. 5. Findings suggesting chronic plantar fasciitis.  Assessment: 1.  Ulcer plantar heel right 2.  Achilles compromise right w/ dorsiflexed foot   Plan of Care:  -Patient evaluated.  -Medically necessary excisional debridement including subcutaneous tissue was performed using a tissue nipper.  Excisional debridement of the necrotic nonviable tissue down to healthier bleeding viable tissue was  performed with postdebridement measurement same as pre-. -Continue gentamicin  cream topical with offloading quarter inch felt padding -Return to clinic 4 weeks   Thresa EMERSON Sar, DPM [facility]  Dr. Thresa EMERSON Sar, DPM    [address] [place], [place] [address]                Office [phone]  Fax [phone]   "

## 2024-09-06 ENCOUNTER — Ambulatory Visit: Admitting: Podiatry

## 2024-09-15 ENCOUNTER — Ambulatory Visit: Admitting: Podiatry

## 2025-05-22 ENCOUNTER — Ambulatory Visit

## 2025-06-13 ENCOUNTER — Encounter (INDEPENDENT_AMBULATORY_CARE_PROVIDER_SITE_OTHER)

## 2025-06-13 ENCOUNTER — Ambulatory Visit (INDEPENDENT_AMBULATORY_CARE_PROVIDER_SITE_OTHER): Admitting: Vascular Surgery

## 2025-06-28 ENCOUNTER — Encounter: Admitting: Family

## 2025-06-29 ENCOUNTER — Ambulatory Visit: Admitting: Urology
